# Patient Record
Sex: Female | Born: 1945
Health system: Southern US, Community
[De-identification: ages and names within clinical notes are randomized; demographics above are authoritative.]

## PROBLEM LIST (undated history)

## (undated) DIAGNOSIS — L309 Dermatitis, unspecified: Secondary | ICD-10-CM

## (undated) DIAGNOSIS — I10 Essential (primary) hypertension: Secondary | ICD-10-CM

## (undated) DIAGNOSIS — IMO0001 Reserved for inherently not codable concepts without codable children: Secondary | ICD-10-CM

## (undated) DIAGNOSIS — E669 Obesity, unspecified: Secondary | ICD-10-CM

## (undated) DIAGNOSIS — E7800 Pure hypercholesterolemia, unspecified: Secondary | ICD-10-CM

## (undated) DIAGNOSIS — I2510 Atherosclerotic heart disease of native coronary artery without angina pectoris: Secondary | ICD-10-CM

## (undated) DIAGNOSIS — J45909 Unspecified asthma, uncomplicated: Secondary | ICD-10-CM

## (undated) DIAGNOSIS — Z85038 Personal history of other malignant neoplasm of large intestine: Secondary | ICD-10-CM

## (undated) DIAGNOSIS — J449 Chronic obstructive pulmonary disease, unspecified: Secondary | ICD-10-CM

## (undated) DIAGNOSIS — E1142 Type 2 diabetes mellitus with diabetic polyneuropathy: Secondary | ICD-10-CM

## (undated) DIAGNOSIS — M8580 Other specified disorders of bone density and structure, unspecified site: Secondary | ICD-10-CM

## (undated) DIAGNOSIS — E739 Lactose intolerance, unspecified: Secondary | ICD-10-CM

## (undated) DIAGNOSIS — E042 Nontoxic multinodular goiter: Secondary | ICD-10-CM

## (undated) DIAGNOSIS — Z9289 Personal history of other medical treatment: Secondary | ICD-10-CM

## (undated) DIAGNOSIS — R7302 Impaired glucose tolerance (oral): Secondary | ICD-10-CM

## (undated) DIAGNOSIS — L409 Psoriasis, unspecified: Secondary | ICD-10-CM

## (undated) DIAGNOSIS — M1990 Unspecified osteoarthritis, unspecified site: Secondary | ICD-10-CM

## (undated) DIAGNOSIS — E119 Type 2 diabetes mellitus without complications: Secondary | ICD-10-CM

## (undated) DIAGNOSIS — M129 Arthropathy, unspecified: Secondary | ICD-10-CM

## (undated) DIAGNOSIS — I517 Cardiomegaly: Secondary | ICD-10-CM

## (undated) DIAGNOSIS — C801 Malignant (primary) neoplasm, unspecified: Secondary | ICD-10-CM

## (undated) DIAGNOSIS — K219 Gastro-esophageal reflux disease without esophagitis: Secondary | ICD-10-CM

## (undated) HISTORY — DX: Nontoxic multinodular goiter: E04.2

## (undated) HISTORY — PX: CARDIAC CATHETERIZATION: SHX172

## (undated) HISTORY — PX: TUBAL LIGATION: SHX77

## (undated) HISTORY — DX: Type 2 diabetes mellitus with diabetic polyneuropathy: E11.42

## (undated) HISTORY — DX: Unspecified asthma, uncomplicated: J45.909

## (undated) HISTORY — DX: Dermatitis, unspecified: L30.9

## (undated) HISTORY — DX: Cardiomegaly: I51.7

## (undated) HISTORY — DX: Arthropathy, unspecified: M12.9

## (undated) HISTORY — PX: COLON SURGERY: SHX602

## (undated) HISTORY — DX: Personal history of other malignant neoplasm of large intestine: Z85.038

## (undated) HISTORY — PX: HERNIA REPAIR: SHX51

## (undated) HISTORY — DX: Other specified disorders of bone density and structure, unspecified site: M85.80

## (undated) HISTORY — DX: Lactose intolerance, unspecified: E73.9

## (undated) HISTORY — DX: Chronic obstructive pulmonary disease, unspecified: J44.9

## (undated) HISTORY — DX: Pure hypercholesterolemia, unspecified: E78.00

## (undated) HISTORY — DX: Unspecified osteoarthritis, unspecified site: M19.90

## (undated) HISTORY — DX: Atherosclerotic heart disease of native coronary artery without angina pectoris: I25.10

## (undated) HISTORY — DX: Psoriasis, unspecified: L40.9

## (undated) HISTORY — DX: Type 2 diabetes mellitus without complications: E11.9

## (undated) HISTORY — DX: Obesity, unspecified: E66.9

## (undated) HISTORY — PX: ABDOMINAL HYSTERECTOMY: SHX81

## (undated) HISTORY — DX: Essential (primary) hypertension: I10

## (undated) HISTORY — DX: Gastro-esophageal reflux disease without esophagitis: K21.9

## (undated) HISTORY — DX: Impaired glucose tolerance (oral): R73.02

---

## 1985-12-12 DIAGNOSIS — C801 Malignant (primary) neoplasm, unspecified: Secondary | ICD-10-CM

## 1985-12-12 HISTORY — PX: HEMICOLECTOMY: SHX854

## 1985-12-12 HISTORY — DX: Malignant (primary) neoplasm, unspecified: C80.1

## 1999-01-07 ENCOUNTER — Other Ambulatory Visit: Admission: RE | Admit: 1999-01-07 | Discharge: 1999-01-07 | Payer: Self-pay | Admitting: Nurse Practitioner

## 1999-02-18 ENCOUNTER — Encounter: Payer: Self-pay | Admitting: Internal Medicine

## 1999-02-18 ENCOUNTER — Ambulatory Visit (HOSPITAL_COMMUNITY): Admission: RE | Admit: 1999-02-18 | Discharge: 1999-02-18 | Payer: Self-pay | Admitting: Internal Medicine

## 2000-01-12 ENCOUNTER — Other Ambulatory Visit: Admission: RE | Admit: 2000-01-12 | Discharge: 2000-01-12 | Payer: Self-pay | Admitting: Internal Medicine

## 2000-02-21 ENCOUNTER — Ambulatory Visit (HOSPITAL_COMMUNITY): Admission: RE | Admit: 2000-02-21 | Discharge: 2000-02-21 | Payer: Self-pay | Admitting: Internal Medicine

## 2000-02-21 ENCOUNTER — Encounter: Payer: Self-pay | Admitting: Internal Medicine

## 2000-03-09 ENCOUNTER — Ambulatory Visit (HOSPITAL_COMMUNITY): Admission: RE | Admit: 2000-03-09 | Discharge: 2000-03-09 | Payer: Self-pay | Admitting: Gastroenterology

## 2000-03-09 ENCOUNTER — Encounter: Payer: Self-pay | Admitting: Gastroenterology

## 2001-01-12 ENCOUNTER — Encounter: Payer: Self-pay | Admitting: Internal Medicine

## 2001-01-15 ENCOUNTER — Other Ambulatory Visit: Admission: RE | Admit: 2001-01-15 | Discharge: 2001-01-15 | Payer: Self-pay | Admitting: Internal Medicine

## 2001-02-13 ENCOUNTER — Ambulatory Visit (HOSPITAL_COMMUNITY): Admission: RE | Admit: 2001-02-13 | Discharge: 2001-02-13 | Payer: Self-pay | Admitting: *Deleted

## 2001-02-13 ENCOUNTER — Encounter: Payer: Self-pay | Admitting: *Deleted

## 2001-02-28 ENCOUNTER — Encounter: Payer: Self-pay | Admitting: Internal Medicine

## 2001-02-28 ENCOUNTER — Ambulatory Visit (HOSPITAL_COMMUNITY): Admission: RE | Admit: 2001-02-28 | Discharge: 2001-02-28 | Payer: Self-pay | Admitting: Internal Medicine

## 2001-03-08 ENCOUNTER — Encounter: Payer: Self-pay | Admitting: Internal Medicine

## 2001-03-08 ENCOUNTER — Ambulatory Visit: Admission: RE | Admit: 2001-03-08 | Discharge: 2001-03-08 | Payer: Self-pay | Admitting: Pulmonary Disease

## 2002-04-09 ENCOUNTER — Ambulatory Visit (HOSPITAL_COMMUNITY): Admission: RE | Admit: 2002-04-09 | Discharge: 2002-04-09 | Payer: Self-pay | Admitting: Internal Medicine

## 2002-04-09 ENCOUNTER — Encounter: Payer: Self-pay | Admitting: Internal Medicine

## 2003-07-21 ENCOUNTER — Ambulatory Visit (HOSPITAL_COMMUNITY): Admission: RE | Admit: 2003-07-21 | Discharge: 2003-07-21 | Payer: Self-pay | Admitting: *Deleted

## 2003-07-21 ENCOUNTER — Encounter: Payer: Self-pay | Admitting: *Deleted

## 2004-06-25 ENCOUNTER — Ambulatory Visit (HOSPITAL_COMMUNITY): Admission: RE | Admit: 2004-06-25 | Discharge: 2004-06-25 | Payer: Self-pay | Admitting: Internal Medicine

## 2004-10-28 ENCOUNTER — Ambulatory Visit: Payer: Self-pay | Admitting: Cardiology

## 2004-11-12 ENCOUNTER — Encounter: Payer: Self-pay | Admitting: Internal Medicine

## 2004-11-12 ENCOUNTER — Ambulatory Visit: Payer: Self-pay | Admitting: Pulmonary Disease

## 2004-12-28 ENCOUNTER — Ambulatory Visit: Payer: Self-pay | Admitting: Pulmonary Disease

## 2004-12-28 ENCOUNTER — Encounter: Payer: Self-pay | Admitting: Internal Medicine

## 2005-02-09 ENCOUNTER — Ambulatory Visit: Payer: Self-pay | Admitting: Pulmonary Disease

## 2005-03-30 ENCOUNTER — Ambulatory Visit: Payer: Self-pay | Admitting: Internal Medicine

## 2005-04-08 ENCOUNTER — Ambulatory Visit: Payer: Self-pay | Admitting: Internal Medicine

## 2005-04-15 ENCOUNTER — Ambulatory Visit: Payer: Self-pay | Admitting: Internal Medicine

## 2005-05-06 ENCOUNTER — Ambulatory Visit: Payer: Self-pay | Admitting: Pulmonary Disease

## 2005-07-29 ENCOUNTER — Ambulatory Visit (HOSPITAL_COMMUNITY): Admission: RE | Admit: 2005-07-29 | Discharge: 2005-07-29 | Payer: Self-pay | Admitting: Internal Medicine

## 2005-08-25 ENCOUNTER — Ambulatory Visit: Payer: Self-pay | Admitting: Pulmonary Disease

## 2005-09-02 ENCOUNTER — Ambulatory Visit: Payer: Self-pay | Admitting: Internal Medicine

## 2005-10-07 ENCOUNTER — Ambulatory Visit: Payer: Self-pay | Admitting: Internal Medicine

## 2006-01-10 ENCOUNTER — Ambulatory Visit: Payer: Self-pay | Admitting: Pulmonary Disease

## 2006-04-03 ENCOUNTER — Ambulatory Visit: Payer: Self-pay | Admitting: Internal Medicine

## 2006-04-10 ENCOUNTER — Ambulatory Visit: Payer: Self-pay | Admitting: Internal Medicine

## 2006-04-28 ENCOUNTER — Ambulatory Visit: Payer: Self-pay | Admitting: Pulmonary Disease

## 2006-05-03 ENCOUNTER — Encounter: Payer: Self-pay | Admitting: Internal Medicine

## 2006-05-03 ENCOUNTER — Ambulatory Visit: Payer: Self-pay

## 2006-08-11 ENCOUNTER — Ambulatory Visit: Payer: Self-pay | Admitting: Pulmonary Disease

## 2006-09-15 ENCOUNTER — Ambulatory Visit: Payer: Self-pay | Admitting: Internal Medicine

## 2006-09-21 ENCOUNTER — Ambulatory Visit: Payer: Self-pay | Admitting: Pulmonary Disease

## 2006-11-15 ENCOUNTER — Ambulatory Visit: Payer: Self-pay | Admitting: Pulmonary Disease

## 2007-02-21 ENCOUNTER — Ambulatory Visit: Payer: Self-pay | Admitting: Pulmonary Disease

## 2007-03-07 ENCOUNTER — Ambulatory Visit: Payer: Self-pay | Admitting: Critical Care Medicine

## 2007-04-04 ENCOUNTER — Ambulatory Visit: Payer: Self-pay | Admitting: Internal Medicine

## 2007-04-04 LAB — CONVERTED CEMR LAB
ALT: 21 units/L (ref 0–40)
AST: 20 units/L (ref 0–37)
Alkaline Phosphatase: 68 units/L (ref 39–117)
BUN: 18 mg/dL (ref 6–23)
Basophils Relative: 0 % (ref 0.0–1.0)
Bilirubin Urine: NEGATIVE
CO2: 31 meq/L (ref 19–32)
Chloride: 106 meq/L (ref 96–112)
Creatinine, Ser: 0.7 mg/dL (ref 0.4–1.2)
Eosinophils Absolute: 0.3 10*3/uL (ref 0.0–0.6)
GFR calc Af Amer: 109 mL/min
Glucose, Bld: 166 mg/dL — ABNORMAL HIGH (ref 70–99)
HDL: 41.3 mg/dL (ref >39.0)
LDL Cholesterol: 114 mg/dL — ABNORMAL HIGH (ref 0–99)
Lymphocytes Relative: 22.8 % (ref 12.0–46.0)
MCHC: 33.7 g/dL (ref 30.0–36.0)
MCV: 80.4 fL (ref 78.0–100.0)
Neutrophils Relative %: 67.9 % (ref 43.0–77.0)
RBC: 4.56 M/uL (ref 3.87–5.11)
Sodium: 143 meq/L (ref 135–145)
TSH: 0.73 microintl units/mL (ref 0.35–5.50)
Total Bilirubin: 0.7 mg/dL (ref 0.3–1.2)
Urine Glucose: NEGATIVE mg/dL
VLDL: 32 mg/dL (ref 0–40)
WBC: 7.2 10*3/uL (ref 4.5–10.5)

## 2007-04-12 ENCOUNTER — Ambulatory Visit: Payer: Self-pay | Admitting: Internal Medicine

## 2007-07-20 ENCOUNTER — Ambulatory Visit: Payer: Self-pay | Admitting: Pulmonary Disease

## 2007-09-03 ENCOUNTER — Encounter: Payer: Self-pay | Admitting: Internal Medicine

## 2007-09-03 DIAGNOSIS — I2510 Atherosclerotic heart disease of native coronary artery without angina pectoris: Secondary | ICD-10-CM

## 2007-09-03 DIAGNOSIS — R002 Palpitations: Secondary | ICD-10-CM

## 2007-09-03 DIAGNOSIS — E669 Obesity, unspecified: Secondary | ICD-10-CM

## 2007-09-03 DIAGNOSIS — E739 Lactose intolerance, unspecified: Secondary | ICD-10-CM

## 2007-09-03 DIAGNOSIS — J45909 Unspecified asthma, uncomplicated: Secondary | ICD-10-CM

## 2007-09-03 DIAGNOSIS — K219 Gastro-esophageal reflux disease without esophagitis: Secondary | ICD-10-CM | POA: Insufficient documentation

## 2007-09-03 DIAGNOSIS — J4489 Other specified chronic obstructive pulmonary disease: Secondary | ICD-10-CM

## 2007-09-03 DIAGNOSIS — J449 Chronic obstructive pulmonary disease, unspecified: Secondary | ICD-10-CM

## 2007-09-03 DIAGNOSIS — I10 Essential (primary) hypertension: Secondary | ICD-10-CM

## 2007-09-03 DIAGNOSIS — E7800 Pure hypercholesterolemia, unspecified: Secondary | ICD-10-CM

## 2007-09-03 DIAGNOSIS — J309 Allergic rhinitis, unspecified: Secondary | ICD-10-CM

## 2007-09-03 HISTORY — DX: Unspecified asthma, uncomplicated: J45.909

## 2007-09-03 HISTORY — DX: Essential (primary) hypertension: I10

## 2007-09-03 HISTORY — DX: Lactose intolerance, unspecified: E73.9

## 2007-09-03 HISTORY — DX: Obesity, unspecified: E66.9

## 2007-09-03 HISTORY — DX: Pure hypercholesterolemia, unspecified: E78.00

## 2007-09-03 HISTORY — DX: Atherosclerotic heart disease of native coronary artery without angina pectoris: I25.10

## 2007-09-03 HISTORY — DX: Gastro-esophageal reflux disease without esophagitis: K21.9

## 2007-09-03 HISTORY — DX: Other specified chronic obstructive pulmonary disease: J44.89

## 2007-09-03 HISTORY — DX: Chronic obstructive pulmonary disease, unspecified: J44.9

## 2007-09-14 ENCOUNTER — Ambulatory Visit: Payer: Self-pay | Admitting: Internal Medicine

## 2007-09-14 LAB — CONVERTED CEMR LAB
BUN: 14 mg/dL (ref 6–23)
Cholesterol: 152 mg/dL (ref 0–200)
Creatinine, Ser: 0.7 mg/dL (ref 0.4–1.2)
GFR calc Af Amer: 109 mL/min
Glucose, Bld: 136 mg/dL — ABNORMAL HIGH (ref 70–99)
Total CHOL/HDL Ratio: 4
VLDL: 16 mg/dL (ref 0–40)

## 2007-09-20 ENCOUNTER — Ambulatory Visit: Payer: Self-pay | Admitting: Internal Medicine

## 2007-09-20 ENCOUNTER — Ambulatory Visit (HOSPITAL_COMMUNITY): Admission: RE | Admit: 2007-09-20 | Discharge: 2007-09-20 | Payer: Self-pay | Admitting: Internal Medicine

## 2007-09-25 ENCOUNTER — Telehealth: Payer: Self-pay | Admitting: Internal Medicine

## 2007-10-25 ENCOUNTER — Ambulatory Visit: Payer: Self-pay | Admitting: Internal Medicine

## 2007-10-29 ENCOUNTER — Ambulatory Visit: Payer: Self-pay | Admitting: Pulmonary Disease

## 2007-10-30 ENCOUNTER — Ambulatory Visit (HOSPITAL_COMMUNITY): Admission: RE | Admit: 2007-10-30 | Discharge: 2007-10-30 | Payer: Self-pay | Admitting: Internal Medicine

## 2007-12-11 ENCOUNTER — Ambulatory Visit: Payer: Self-pay | Admitting: Internal Medicine

## 2007-12-20 ENCOUNTER — Ambulatory Visit: Payer: Self-pay | Admitting: Internal Medicine

## 2007-12-20 ENCOUNTER — Encounter: Payer: Self-pay | Admitting: Internal Medicine

## 2007-12-20 LAB — HM COLONOSCOPY: HM Colonoscopy: NORMAL

## 2008-01-30 DIAGNOSIS — M129 Arthropathy, unspecified: Secondary | ICD-10-CM

## 2008-01-30 DIAGNOSIS — E119 Type 2 diabetes mellitus without complications: Secondary | ICD-10-CM

## 2008-01-30 DIAGNOSIS — M1990 Unspecified osteoarthritis, unspecified site: Secondary | ICD-10-CM | POA: Insufficient documentation

## 2008-01-30 DIAGNOSIS — E1140 Type 2 diabetes mellitus with diabetic neuropathy, unspecified: Secondary | ICD-10-CM

## 2008-01-30 HISTORY — DX: Unspecified osteoarthritis, unspecified site: M19.90

## 2008-01-30 HISTORY — DX: Arthropathy, unspecified: M12.9

## 2008-01-30 HISTORY — DX: Type 2 diabetes mellitus without complications: E11.9

## 2008-03-11 ENCOUNTER — Telehealth (INDEPENDENT_AMBULATORY_CARE_PROVIDER_SITE_OTHER): Payer: Self-pay | Admitting: *Deleted

## 2008-03-27 ENCOUNTER — Encounter: Payer: Self-pay | Admitting: Pulmonary Disease

## 2008-03-31 ENCOUNTER — Ambulatory Visit: Payer: Self-pay | Admitting: Internal Medicine

## 2008-03-31 LAB — CONVERTED CEMR LAB
ALT: 17 units/L (ref 0–35)
Albumin: 3.5 g/dL (ref 3.5–5.2)
Bilirubin Urine: NEGATIVE
Bilirubin, Direct: 0.1 mg/dL (ref 0.0–0.3)
Chloride: 104 meq/L (ref 96–112)
HCT: 38.2 % (ref 36.0–46.0)
Hgb A1c MFr Bld: 6.8 % — ABNORMAL HIGH (ref 4.6–6.0)
Ketones, ur: NEGATIVE mg/dL
LDL Cholesterol: 101 mg/dL — ABNORMAL HIGH (ref 0–99)
Leukocytes, UA: NEGATIVE
Lymphocytes Relative: 25.1 % (ref 12.0–46.0)
Monocytes Absolute: 0.5 10*3/uL (ref 0.1–1.0)
Monocytes Relative: 6.1 % (ref 3.0–12.0)
Neutrophils Relative %: 64.8 % (ref 43.0–77.0)
Nitrite: NEGATIVE
Platelets: 201 10*3/uL (ref 150–400)
TSH: 1.39 microintl units/mL (ref 0.35–5.50)
Total CHOL/HDL Ratio: 3.8
Total Protein, Urine: NEGATIVE mg/dL
Total Protein: 6.4 g/dL (ref 6.0–8.3)
Triglycerides: 93 mg/dL (ref 0–149)
Urine Glucose: NEGATIVE mg/dL
Urobilinogen, UA: 0.2 (ref 0.0–1.0)
WBC: 8.5 10*3/uL (ref 4.5–10.5)
pH: 5.5 (ref 5.0–8.0)

## 2008-04-07 ENCOUNTER — Ambulatory Visit: Payer: Self-pay | Admitting: Internal Medicine

## 2008-04-07 DIAGNOSIS — Z85038 Personal history of other malignant neoplasm of large intestine: Secondary | ICD-10-CM

## 2008-04-07 DIAGNOSIS — M949 Disorder of cartilage, unspecified: Secondary | ICD-10-CM

## 2008-04-07 DIAGNOSIS — M899 Disorder of bone, unspecified: Secondary | ICD-10-CM | POA: Insufficient documentation

## 2008-04-07 HISTORY — DX: Personal history of other malignant neoplasm of large intestine: Z85.038

## 2008-04-15 ENCOUNTER — Telehealth (INDEPENDENT_AMBULATORY_CARE_PROVIDER_SITE_OTHER): Payer: Self-pay | Admitting: *Deleted

## 2008-05-20 ENCOUNTER — Telehealth (INDEPENDENT_AMBULATORY_CARE_PROVIDER_SITE_OTHER): Payer: Self-pay | Admitting: *Deleted

## 2008-05-22 ENCOUNTER — Encounter: Payer: Self-pay | Admitting: Internal Medicine

## 2008-06-04 ENCOUNTER — Ambulatory Visit: Payer: Self-pay | Admitting: Pulmonary Disease

## 2008-06-23 ENCOUNTER — Encounter: Payer: Self-pay | Admitting: Internal Medicine

## 2008-09-05 ENCOUNTER — Ambulatory Visit: Payer: Self-pay | Admitting: Pulmonary Disease

## 2008-09-23 ENCOUNTER — Telehealth: Payer: Self-pay | Admitting: Internal Medicine

## 2008-09-25 ENCOUNTER — Ambulatory Visit (HOSPITAL_COMMUNITY): Admission: RE | Admit: 2008-09-25 | Discharge: 2008-09-25 | Payer: Self-pay | Admitting: Internal Medicine

## 2008-10-01 ENCOUNTER — Ambulatory Visit: Payer: Self-pay | Admitting: Internal Medicine

## 2008-10-01 LAB — CONVERTED CEMR LAB
BUN: 15 mg/dL (ref 6–23)
CO2: 31 meq/L (ref 19–32)
Calcium: 8.8 mg/dL (ref 8.4–10.5)
Cholesterol: 156 mg/dL (ref 0–200)
GFR calc non Af Amer: 77 mL/min
HDL: 40.5 mg/dL (ref >39.0)
Sodium: 143 meq/L (ref 135–145)
Total CHOL/HDL Ratio: 3.9
Triglycerides: 88 mg/dL (ref 0–149)
VLDL: 18 mg/dL (ref 0–40)

## 2008-10-07 ENCOUNTER — Ambulatory Visit: Payer: Self-pay | Admitting: Internal Medicine

## 2008-10-07 DIAGNOSIS — J45901 Unspecified asthma with (acute) exacerbation: Secondary | ICD-10-CM | POA: Insufficient documentation

## 2008-11-12 ENCOUNTER — Ambulatory Visit: Payer: Self-pay | Admitting: Pulmonary Disease

## 2009-03-12 ENCOUNTER — Ambulatory Visit: Payer: Self-pay | Admitting: Pulmonary Disease

## 2009-03-19 ENCOUNTER — Ambulatory Visit: Payer: Self-pay | Admitting: Pulmonary Disease

## 2009-03-19 ENCOUNTER — Encounter: Payer: Self-pay | Admitting: Adult Health

## 2009-03-19 ENCOUNTER — Encounter (INDEPENDENT_AMBULATORY_CARE_PROVIDER_SITE_OTHER): Payer: Self-pay | Admitting: *Deleted

## 2009-04-03 ENCOUNTER — Ambulatory Visit: Payer: Self-pay | Admitting: Internal Medicine

## 2009-04-03 LAB — CONVERTED CEMR LAB
Alkaline Phosphatase: 60 units/L (ref 39–117)
BUN: 13 mg/dL (ref 6–23)
Basophils Absolute: 0 10*3/uL (ref 0.0–0.1)
Bilirubin Urine: NEGATIVE
Bilirubin, Direct: 0.1 mg/dL (ref 0.0–0.3)
Chloride: 110 meq/L (ref 96–112)
Creatinine, Ser: 0.7 mg/dL (ref 0.4–1.2)
Creatinine,U: 197.9 mg/dL
Eosinophils Absolute: 0.3 10*3/uL (ref 0.0–0.7)
Eosinophils Relative: 2.8 % (ref 0.0–5.0)
GFR calc non Af Amer: 108.49 mL/min (ref >60)
Glucose, Bld: 121 mg/dL — ABNORMAL HIGH (ref 70–99)
HCT: 40.1 % (ref 36.0–46.0)
Ketones, ur: NEGATIVE mg/dL
Lymphs Abs: 2.4 10*3/uL (ref 0.7–4.0)
MCHC: 33 g/dL (ref 30.0–36.0)
MCV: 82.5 fL (ref 78.0–100.0)
Monocytes Absolute: 0.7 10*3/uL (ref 0.1–1.0)
Nitrite: NEGATIVE
Platelets: 199 10*3/uL (ref 150.0–400.0)
Potassium: 4.4 meq/L (ref 3.5–5.1)
RDW: 16 % — ABNORMAL HIGH (ref 11.5–14.6)
TSH: 1.17 microintl units/mL (ref 0.35–5.50)
Total CHOL/HDL Ratio: 4
Urine Glucose: NEGATIVE mg/dL
Urobilinogen, UA: 0.2 (ref 0.0–1.0)
pH: 6 (ref 5.0–8.0)

## 2009-04-09 ENCOUNTER — Ambulatory Visit: Payer: Self-pay | Admitting: Internal Medicine

## 2009-07-21 ENCOUNTER — Ambulatory Visit: Payer: Self-pay | Admitting: Pulmonary Disease

## 2009-09-29 ENCOUNTER — Ambulatory Visit: Payer: Self-pay | Admitting: Internal Medicine

## 2009-09-29 ENCOUNTER — Ambulatory Visit (HOSPITAL_COMMUNITY): Admission: RE | Admit: 2009-09-29 | Discharge: 2009-09-29 | Payer: Self-pay | Admitting: Internal Medicine

## 2009-09-29 LAB — CONVERTED CEMR LAB
BUN: 13 mg/dL (ref 6–23)
CO2: 31 meq/L (ref 19–32)
Cholesterol: 139 mg/dL (ref 0–200)
GFR calc non Af Amer: 108.32 mL/min (ref >60)
Glucose, Bld: 105 mg/dL — ABNORMAL HIGH (ref 70–99)
Hgb A1c MFr Bld: 6.6 % — ABNORMAL HIGH (ref 4.6–6.5)
LDL Cholesterol: 81 mg/dL (ref 0–99)
Potassium: 4.1 meq/L (ref 3.5–5.1)
Sodium: 142 meq/L (ref 135–145)
VLDL: 16.6 mg/dL (ref 0.0–40.0)

## 2009-10-07 ENCOUNTER — Ambulatory Visit: Payer: Self-pay | Admitting: Internal Medicine

## 2009-10-19 ENCOUNTER — Ambulatory Visit: Payer: Self-pay | Admitting: Pulmonary Disease

## 2009-11-10 ENCOUNTER — Ambulatory Visit: Payer: Self-pay | Admitting: Internal Medicine

## 2009-11-10 DIAGNOSIS — R350 Frequency of micturition: Secondary | ICD-10-CM

## 2009-11-10 LAB — CONVERTED CEMR LAB
Bilirubin Urine: NEGATIVE
Specific Gravity, Urine: 1.015 (ref 1.000–1.030)
Urobilinogen, UA: 0.2 (ref 0.0–1.0)
pH: 6 (ref 5.0–8.0)

## 2010-02-16 ENCOUNTER — Ambulatory Visit: Payer: Self-pay | Admitting: Pulmonary Disease

## 2010-04-06 ENCOUNTER — Ambulatory Visit: Payer: Self-pay | Admitting: Internal Medicine

## 2010-04-06 LAB — CONVERTED CEMR LAB
AST: 17 units/L (ref 0–37)
Albumin: 3.8 g/dL (ref 3.5–5.2)
BUN: 15 mg/dL (ref 6–23)
Bilirubin Urine: NEGATIVE
Bilirubin, Direct: 0.2 mg/dL (ref 0.0–0.3)
Calcium: 9.3 mg/dL (ref 8.4–10.5)
Cholesterol: 144 mg/dL (ref 0–200)
Creatinine, Ser: 0.7 mg/dL (ref 0.4–1.2)
GFR calc non Af Amer: 108.14 mL/min (ref >60)
Glucose, Bld: 100 mg/dL — ABNORMAL HIGH (ref 70–99)
HCT: 38.3 % (ref 36.0–46.0)
Hemoglobin: 12.8 g/dL (ref 12.0–15.0)
Hgb A1c MFr Bld: 6.3 % (ref 4.6–6.5)
Ketones, ur: NEGATIVE mg/dL
Leukocytes, UA: NEGATIVE
Lymphocytes Relative: 27 % (ref 12.0–46.0)
Lymphs Abs: 2.2 10*3/uL (ref 0.7–4.0)
MCHC: 33.5 g/dL (ref 30.0–36.0)
Microalb Creat Ratio: 7.5 mg/g (ref 0.0–30.0)
Monocytes Absolute: 0.6 10*3/uL (ref 0.1–1.0)
Monocytes Relative: 6.8 % (ref 3.0–12.0)
Neutro Abs: 5.2 10*3/uL (ref 1.4–7.7)
Neutrophils Relative %: 62.7 % (ref 43.0–77.0)
RBC: 4.61 M/uL (ref 3.87–5.11)
Sodium: 144 meq/L (ref 135–145)
Specific Gravity, Urine: >=1.03 (ref 1.000–1.030)
Total Protein: 6.2 g/dL (ref 6.0–8.3)
Triglycerides: 101 mg/dL (ref 0.0–149.0)
Urine Glucose: NEGATIVE mg/dL
Urobilinogen, UA: 0.2 (ref 0.0–1.0)
WBC: 8.3 10*3/uL (ref 4.5–10.5)

## 2010-04-12 ENCOUNTER — Ambulatory Visit: Payer: Self-pay | Admitting: Internal Medicine

## 2010-05-07 ENCOUNTER — Ambulatory Visit: Payer: Self-pay | Admitting: Internal Medicine

## 2010-05-14 ENCOUNTER — Encounter: Payer: Self-pay | Admitting: Internal Medicine

## 2010-05-14 ENCOUNTER — Encounter: Admission: RE | Admit: 2010-05-14 | Discharge: 2010-05-14 | Payer: Self-pay | Admitting: Internal Medicine

## 2010-05-17 ENCOUNTER — Telehealth (INDEPENDENT_AMBULATORY_CARE_PROVIDER_SITE_OTHER): Payer: Self-pay | Admitting: *Deleted

## 2010-05-21 ENCOUNTER — Ambulatory Visit: Payer: Self-pay | Admitting: Endocrinology

## 2010-05-21 DIAGNOSIS — E042 Nontoxic multinodular goiter: Secondary | ICD-10-CM

## 2010-05-21 HISTORY — DX: Nontoxic multinodular goiter: E04.2

## 2010-05-26 ENCOUNTER — Telehealth: Payer: Self-pay | Admitting: Internal Medicine

## 2010-06-22 ENCOUNTER — Telehealth: Payer: Self-pay | Admitting: Internal Medicine

## 2010-06-25 ENCOUNTER — Ambulatory Visit: Payer: Self-pay | Admitting: Pulmonary Disease

## 2010-07-02 ENCOUNTER — Encounter: Payer: Self-pay | Admitting: Internal Medicine

## 2010-07-02 ENCOUNTER — Ambulatory Visit: Payer: Self-pay | Admitting: Internal Medicine

## 2010-07-06 ENCOUNTER — Encounter: Payer: Self-pay | Admitting: Pulmonary Disease

## 2010-08-13 ENCOUNTER — Telehealth: Payer: Self-pay | Admitting: Internal Medicine

## 2010-10-19 ENCOUNTER — Ambulatory Visit: Payer: Self-pay | Admitting: Internal Medicine

## 2010-10-19 DIAGNOSIS — R062 Wheezing: Secondary | ICD-10-CM

## 2010-10-19 LAB — CONVERTED CEMR LAB
BUN: 14 mg/dL (ref 6–23)
Cholesterol: 158 mg/dL (ref 0–200)
GFR calc non Af Amer: 121.92 mL/min (ref >60)
Glucose, Bld: 97 mg/dL (ref 70–99)
HDL: 45.6 mg/dL (ref >39.00)
Hgb A1c MFr Bld: 6.5 % (ref 4.6–6.5)
LDL Cholesterol: 90 mg/dL (ref 0–99)
Potassium: 3.9 meq/L (ref 3.5–5.1)
VLDL: 22 mg/dL (ref 0.0–40.0)

## 2010-10-21 ENCOUNTER — Encounter: Payer: Self-pay | Admitting: Internal Medicine

## 2010-10-26 ENCOUNTER — Ambulatory Visit (HOSPITAL_COMMUNITY): Admission: RE | Admit: 2010-10-26 | Discharge: 2010-10-26 | Payer: Self-pay | Admitting: Internal Medicine

## 2010-11-24 ENCOUNTER — Telehealth: Payer: Self-pay | Admitting: Internal Medicine

## 2010-12-15 ENCOUNTER — Telehealth (INDEPENDENT_AMBULATORY_CARE_PROVIDER_SITE_OTHER): Payer: Self-pay | Admitting: *Deleted

## 2010-12-21 ENCOUNTER — Ambulatory Visit
Admission: RE | Admit: 2010-12-21 | Discharge: 2010-12-21 | Payer: Self-pay | Source: Home / Self Care | Attending: Pulmonary Disease | Admitting: Pulmonary Disease

## 2011-01-13 NOTE — Progress Notes (Signed)
Summary: ALT med  Phone Note Call from Patient Call back at Home Phone [phone]   Summary of Call: Pt called stating that her Rx Drug Plan states that starting in 2012 the cost of her Metoprolol Succinate will go up from $5 to $45. Drug Plan suggest; Metoprolol Tartarte, Carvedilol or Atenolol which will cost $3. Pt is requesting alternative to [facility]. Initial call taken by: Margaret Pyle, CMA,  November 24, 2010 10:30 AM  Follow-up for Phone Call        ok to change the toprol to metoprolol 25 mg - 1and 1/2 by mouth two times a day   to robin to handle Follow-up by: Corwin Levins MD,  November 24, 2010 1:03 PM    New/Updated Medications: METOPROLOL TARTRATE 25 MG TABS (METOPROLOL TARTRATE) 1 and 1/2 by mouth two times a day Prescriptions: METOPROLOL TARTRATE 25 MG TABS (METOPROLOL TARTRATE) 1 and 1/2 by mouth two times a day  #90 x 11   Entered by:   Zella Ball Ewing CMA (AAMA)   Authorized by:   Corwin Levins MD   Signed by:   Scharlene Gloss CMA (AAMA) on 11/24/2010   Method used:   [facility]ed to ...       [facility]  [address] [phone]* (retail)       [address]       [place], [place]  [address]       Ph: [phone] or [phone]       Fax: [phone]   RxID:   [id number]

## 2011-01-13 NOTE — Assessment & Plan Note (Signed)
Summary: CPX/ [employer]/ NWS   Vital Signs:  Patient profile:   65 year old female Height:      63 inches Weight:      161.50 pounds BMI:     28.71 O2 Sat:      97 % on Room air Temp:     98.2 degrees F oral Pulse rate:   60 / minute BP sitting:   104 / 64  (left arm) Cuff size:   regular  Vitals Entered ByZella Ball Ewing (Apr 12, 2010 11:09 AM)  O2 Flow:  Room air  Preventive Care Screening  Mammogram:    Next Due:  10/2010  CC: Adult Physical/RE   Primary Care Provider:  Oliver Barre  CC:  Adult Physical/RE.  History of Present Illness: overall doing well,  Pt denies CP, sob, doe, wheezing, orthopnea, pnd, worsening LE edema, palps, dizziness or syncope  Pt denies new neuro symptoms such as headache, facial or extremity weakness   Pt denies polydipsia, polyuria, or low sugar symptoms such as shakiness improved with eating.  Overall good compliance with meds, trying to follow low chol, DM diet, wt stable, little excercise however   Does mention she thiniks a nodule to the right thyroid has slightly enlarged but denies hyper/hypothyroid symptoms.  No increased pain, wt loss, night sweats.    Problems Prior to Update: 1)  Goiter, Unspecified  (ICD-240.9) 2)  Urinary Frequency  (ICD-788.41) 3)  Asthma, With Acute Exacerbation  (ICD-493.92) 4)  Hx of Colon Cancer  (ICD-10.05) 5)  Preventive Health Care  (ICD-V70.0) 6)  Osteopenia  (ICD-733.90) 7)  Diabetes Mellitus  (ICD-250.00) 8)  Degenerative Joint Disease  (ICD-715.90) 9)  Hx of Adenocarcinoma, Colon, Hx of  (ICD-V10.05) 10)  Arthritis  (ICD-716.90) 11)  Obesity  (ICD-278.00) 12)  Palpitations  (ICD-785.1) 13)  Glucose Intolerance  (ICD-271.3) 14)  Hypercholesterolemia  (ICD-272.0) 15)  Hypertension  (ICD-401.9) 16)  Coronary Artery Disease  (ICD-414.00) 17)  Gerd  (ICD-530.81) 18)  COPD  (ICD-496) 19)  Asthma  (ICD-493.90) 20)  Allergic Rhinitis  (ICD-477.9)  Medications Prior to Update: 1)  Omeprazole 20 Mg  Tbec  (Omeprazole) .... Take 1 Tablet By Mouth Once A Day 2)  Lovastatin 40 Mg  Tabs (Lovastatin) .... 2 By Mouth Qd 3)  Fexofenadine Hcl 180 Mg Tabs (Fexofenadine Hcl) .Marland Kitchen.. 1 Once Daily 4)  Hydrochlorothiazide 25 Mg Tabs (Hydrochlorothiazide) .Marland Kitchen.. 1po Qd 5)  Naproxen 500 Mg Tabs (Naproxen) .... Use As Needed  Two Times A Day As Needed 6)  Ecotrin 325 Mg  Tbec (Aspirin) .Marland Kitchen.. 1 By Mouth Qd 7)  Evista 60 Mg  Tabs (Raloxifene Hcl) .Marland Kitchen.. 1 By Mouth Qd 8)  Norvasc 5 Mg  Tabs (Amlodipine Besylate) .Marland Kitchen.. 1 By Mouth Once Daily 9)  Metformin Hcl 500 Mg  Tabs (Metformin Hcl) .... 2 By Mouth Once Daily 10)  Toprol Xl 50 Mg  Tb24 (Metoprolol Succinate) .Marland Kitchen.. 1 1/2 Tab Once Daily 11)  Mucinex Maximum Strength 1200 Mg  Tb12 (Guaifenesin) .... 2 Tabs By Mouth Prn 12)  Freestyle Lite Test  Strp (Glucose Blood) .... Administer 1 Strip Once Daily As Directed 13)  Freestyle Lancets  Misc (Lancets) .... Use As Directed 14)  Fluticasone Propionate 50 Mcg/act Susp (Fluticasone Propionate) .... 2 Puffs Two Times A Day 15)  Triamcinolone Acetonide 0.1 % Crea (Triamcinolone Acetonide) .... Use Asd Two Times A Day As Needed 16)  Clobetasol Propionate 0.05 % Oint (Clobetasol Propionate) .... Use Twice Once Daily  To Hand and Feet As Needed 17)  Desonide 0.05 % Lotn (Desonide) .... Use Under Breast Two Times A Day As Needed 18)  Ketoconazole 2 % Crea (Ketoconazole) .... Use Asd Two Times A Day As Needed Under Breast 19)  Fluocinolone Acetonide 0.025 % Oint (Fluocinolone Acetonide) .... Use Asd Two Times A Day As Needed  Current Medications (verified): 1)  Omeprazole 20 Mg  Tbec (Omeprazole) .... Take 1 Tablet By Mouth Once A Day 2)  Lovastatin 40 Mg  Tabs (Lovastatin) .... 2 By Mouth Qd 3)  Fexofenadine Hcl 180 Mg Tabs (Fexofenadine Hcl) .Marland Kitchen.. 1 Once Daily 4)  Hydrochlorothiazide 25 Mg Tabs (Hydrochlorothiazide) .Marland Kitchen.. 1po Qd 5)  Naproxen 500 Mg Tabs (Naproxen) .... Use As Needed  Two Times A Day As Needed 6)  Ecotrin 325 Mg  Tbec  (Aspirin) .Marland Kitchen.. 1 By Mouth Qd 7)  Evista 60 Mg  Tabs (Raloxifene Hcl) .Marland Kitchen.. 1 By Mouth Qd 8)  Norvasc 5 Mg  Tabs (Amlodipine Besylate) .Marland Kitchen.. 1 By Mouth Once Daily 9)  Metformin Hcl 500 Mg  Tabs (Metformin Hcl) .... 2 By Mouth Once Daily 10)  Toprol Xl 50 Mg  Tb24 (Metoprolol Succinate) .Marland Kitchen.. 1 1/2 Tab Once Daily 11)  Mucinex Maximum Strength 1200 Mg  Tb12 (Guaifenesin) .... 2 Tabs By Mouth Prn 12)  Freestyle Lite Test  Strp (Glucose Blood) .... Administer 1 Strip Once Daily As Directed 13)  Freestyle Lancets  Misc (Lancets) .... Use As Directed 14)  Fluticasone Propionate 50 Mcg/act Susp (Fluticasone Propionate) .... 2 Puffs Two Times A Day 15)  Triamcinolone Acetonide 0.1 % Crea (Triamcinolone Acetonide) .... Use Asd Two Times A Day As Needed 16)  Clobetasol Propionate 0.05 % Oint (Clobetasol Propionate) .... Use Twice Once Daily To Hand and Feet As Needed 17)  Desonide 0.05 % Lotn (Desonide) .... Use Under Breast Two Times A Day As Needed 18)  Ketoconazole 2 % Crea (Ketoconazole) .... Use Asd Two Times A Day As Needed Under Breast 19)  Fluocinolone Acetonide 0.025 % Oint (Fluocinolone Acetonide) .... Use Asd Two Times A Day As Needed  Allergies (verified): 1)  ! Zocor  Past History:  Past Surgical History: Last updated: 01/30/2008 Hysterectomy Percutaneous transluminal coronary angioplasty bone density 03/30/05 hernia repair tubal ligation rt hemicolectomy  Social History: Last updated: 04-18-2008 Married 4 children - 1 died with meningitis work - stay at home Former Smoker Alcohol use-no  Risk Factors: Smoking Status: quit (04-18-2008)  Past Medical History: DIABETES MELLITUS (ICD-250.00) DEGENERATIVE JOINT DISEASE (ICD-715.90) Hx of ADENOCARCINOMA, COLON, HX OF (ICD-V10.05) ARTHRITIS (ICD-716.90) OBESITY (ICD-278.00) PALPITATIONS (ICD-785.1) GLUCOSE INTOLERANCE (ICD-271.3) HYPERCHOLESTEROLEMIA (ICD-272.0) HYPERTENSION (ICD-401.9) CORONARY ARTERY DISEASE  (ICD-414.00) GERD (ICD-530.81) COPD (ICD-496) chronic asthmatic bronchitis ASTHMA (ICD-493.90) ALLERGIC RHINITIS (ICD-477.9) Osteopenia hx of intestinal adhesions LVH psoriasis/eczema goiter  Review of Systems       all otherwise negative per pt -    Physical Exam  General:  alert and well-developed.   Head:  normocephalic and atraumatic.   Eyes:  vision grossly intact, pupils equal, and pupils round.   Ears:  R ear normal and L ear normal.   Nose:  no external deformity and no nasal discharge.   Mouth:  no gingival abnormalities and pharynx pink and moist.   Neck:  supple.  , throid with enlarged right thyroid/prob nodular, nontender Lungs:  normal respiratory effort and normal breath sounds.   Heart:  normal rate and regular rhythm.   Abdomen:  soft, non-tender, and normal bowel sounds.  Msk:  no joint tenderness and no joint swelling.   Extremities:  no edema, no erythema    Impression & Recommendations:  Problem # 1:  DIABETES MELLITUS (ICD-250.00)  Her updated medication list for this problem includes:    Ecotrin 325 Mg Tbec (Aspirin) .Marland Kitchen... 1 by mouth qd    Metformin Hcl 500 Mg Tabs (Metformin hcl) .Marland Kitchen... 2 by mouth once daily  Labs Reviewed: Creat: 0.7 (04/06/2010)    Reviewed HgBA1c results: 6.3 (04/06/2010)  6.6 (09/29/2009) stable overall by hx and exam, ok to continue meds/tx as is   Problem # 2:  HYPERCHOLESTEROLEMIA (ICD-272.0)  Her updated medication list for this problem includes:    Lovastatin 40 Mg Tabs (Lovastatin) .Marland Kitchen... 2 by mouth qd  Labs Reviewed: SGOT: 17 (04/06/2010)   SGPT: 16 (04/06/2010)   HDL:41.90 (04/06/2010), 41.70 (09/29/2009)  LDL:82 (04/06/2010), 81 (09/29/2009)  Chol:144 (04/06/2010), 139 (09/29/2009)  Trig:101.0 (04/06/2010), 83.0 (09/29/2009) stable overall by hx and exam, ok to continue meds/tx as is   Problem # 3:  HYPERTENSION (ICD-401.9)  Her updated medication list for this problem includes:    Hydrochlorothiazide 25  Mg Tabs (Hydrochlorothiazide) .Marland Kitchen... 1po qd    Norvasc 5 Mg Tabs (Amlodipine besylate) .Marland Kitchen... 1 by mouth once daily    Toprol Xl 50 Mg Tb24 (Metoprolol succinate) .Marland Kitchen... 1 1/2 tab once daily  Orders: EKG w/ Interpretation (93000)  BP today: 104/64 Prior BP: 112/70 (02/16/2010)  Labs Reviewed: K+: 4.4 (04/06/2010) Creat: : 0.7 (04/06/2010)   Chol: 144 (04/06/2010)   HDL: 41.90 (04/06/2010)   LDL: 82 (04/06/2010)   TG: 101.0 (04/06/2010) stable overall by hx and exam, ok to continue meds/tx as is   Problem # 4:  ASTHMA (ICD-493.90) stable overall by hx and exam, ok to continue meds/tx as is, does no require further meds at this time  Problem # 5:  GOITER, UNSPECIFIED (ICD-240.9) with right nodule  ? enlarging - she is adamant she does not want u/s at this time or other eval  Complete Medication List: 1)  Omeprazole 20 Mg Tbec (Omeprazole) .... Take 1 tablet by mouth once a day 2)  Lovastatin 40 Mg Tabs (Lovastatin) .... 2 by mouth qd 3)  Fexofenadine Hcl 180 Mg Tabs (Fexofenadine hcl) .Marland Kitchen.. 1 once daily 4)  Hydrochlorothiazide 25 Mg Tabs (Hydrochlorothiazide) .Marland Kitchen.. 1po qd 5)  Naproxen 500 Mg Tabs (Naproxen) .... Use as needed  two times a day as needed 6)  Ecotrin 325 Mg Tbec (Aspirin) .Marland Kitchen.. 1 by mouth qd 7)  Evista 60 Mg Tabs (Raloxifene hcl) .Marland Kitchen.. 1 by mouth qd 8)  Norvasc 5 Mg Tabs (Amlodipine besylate) .Marland Kitchen.. 1 by mouth once daily 9)  Metformin Hcl 500 Mg Tabs (Metformin hcl) .... 2 by mouth once daily 10)  Toprol Xl 50 Mg Tb24 (Metoprolol succinate) .Marland Kitchen.. 1 1/2 tab once daily 11)  Mucinex Maximum Strength 1200 Mg Tb12 (Guaifenesin) .... 2 tabs by mouth prn 12)  Freestyle Lite Test Strp (Glucose blood) .... Administer 1 strip once daily as directed 13)  Freestyle Lancets Misc (Lancets) .... Use as directed 14)  Fluticasone Propionate 50 Mcg/act Susp (Fluticasone propionate) .... 2 puffs two times a day 15)  Triamcinolone Acetonide 0.1 % Crea (Triamcinolone acetonide) .... Use asd two  times a day as needed 16)  Clobetasol Propionate 0.05 % Oint (Clobetasol propionate) .... Use twice once daily to hand and feet as needed 17)  Desonide 0.05 % Lotn (Desonide) .... Use under breast two times a day as needed  18)  Ketoconazole 2 % Crea (Ketoconazole) .... Use asd two times a day as needed under breast 19)  Fluocinolone Acetonide 0.025 % Oint (Fluocinolone acetonide) .... Use asd two times a day as needed  Patient Instructions: 1)  Continue all previous medications as before this visit 2)  Please schedule a follow-up appointment in 6 months for your "initial medicare wellness exam" 3)  Please call if you change your mind about further evaluation of the thyroid

## 2011-01-13 NOTE — Progress Notes (Signed)
Summary: Rx req  Phone Note Call from Patient Call back at Home Phone [phone]   Caller: Patient Call For: Corwin Levins MD Summary of Call: Pt left message on triage B. Pt requesting generic's for Norvas and Toprol. Please let pt know. Initial call taken by: Verdell Face,  August 13, 2010 10:56 AM  Follow-up for Phone Call        these meds already are generic - ok to ask for this at the pharmacy Follow-up by: Corwin Levins MD,  August 17, 2010 12:52 PM    Prescriptions: TOPROL XL 50 MG  TB24 (METOPROLOL SUCCINATE) 1 1/2 tab once daily  #45 Tablet x 5   Entered by:   Margaret Pyle, CMA   Authorized by:   Corwin Levins MD   Signed by:   Margaret Pyle, CMA on 08/17/2010   Method used:   [facility]        [facility]  [address] [address] [phone]* [facility]ail)       [address]       [place], [place]  [address]       Ph: [phone] or [phone]       Fax: [phone]   RxID:   [id number] NORVASC 5 MG  TABS (AMLODIPINE BESYLATE) 1 by mouth once daily  #90 x 1   Entered by:   Margaret Pyle, CMA   Authorized by:   Corwin Levins MD   Signed by:   Margaret Pyle, CMA on 08/17/2010   Method used:   [facility]        [facility]  [address] [phone]* (retail)       [address]       [place], [place]  [address]       Ph: [phone] or [phone]       Fax: [phone]   RxID:   [id number]

## 2011-01-13 NOTE — Progress Notes (Signed)
  Phone Note Other Incoming   Request: Send information Summary of Call: Request for records received from [facility] [employer]. Request forwarded to [employer].

## 2011-01-13 NOTE — Assessment & Plan Note (Signed)
Summary: 6 mos f/u / #/ cd   Vital Signs:  Patient profile:   65 year old female Height:      63 inches Weight:      156 pounds BMI:     27.73 O2 Sat:      97 % on Room air Temp:     98 degrees F oral Pulse rate:   66 / minute BP sitting:   122 / 70  (left arm) Cuff size:   regular  Vitals Entered By: Zella Ball Ewing CMA Duncan Dull) (October 19, 2010 9:46 AM)  O2 Flow:  Room air CC: 6 month followup/RE   Primary Care Provider:  Oliver Barre  CC:  6 month followup/RE.  History of Present Illness: here to f/u - med review indicates pt not taking the evista as too expensive;  dxa reviewed with pt c/w mild osteopenia today; c/o today acute onset fever, ST, mild prod cough for 3 days with greenish sputum, headache, general weakness and malaise, and today with mild sob/doe/wheezing.  Pt denies CP,  orthopnea, pnd, worsening LE edema, palps, dizziness or syncope . Pt denies new neuro symptoms such as headache, facial or extremity weakness  Pt denies polydipsia, polyuria, or low sugar symptoms such as shakiness improved with eating.  Overall good compliance with meds, trying to follow low chol, DM diet, wt stable, little excercise however  No recent unusual wt loss, night sweats, loss of appetite or other constitutional symptoms except with the acute illness as above.    Preventive Screening-Counseling & Management      Drug Use:  no.    Problems Prior to Update: 1)  Wheezing  (ICD-786.07) 2)  Bronchitis-acute  (ICD-466.0) 3)  Goiter, Multinodular  (ICD-241.1) 4)  Urinary Frequency  (ICD-788.41) 5)  Asthma, With Acute Exacerbation  (ICD-493.92) 6)  Hx of Colon Cancer  (ICD-10.05) 7)  Preventive Health Care  (ICD-V70.0) 8)  Osteopenia  (ICD-733.90) 9)  Diabetes Mellitus  (ICD-250.00) 10)  Degenerative Joint Disease  (ICD-715.90) 11)  Hx of Adenocarcinoma, Colon, Hx of  (ICD-V10.05) 12)  Arthritis  (ICD-716.90) 13)  Obesity  (ICD-278.00) 14)  Palpitations  (ICD-785.1) 15)  Glucose  Intolerance  (ICD-271.3) 16)  Hypercholesterolemia  (ICD-272.0) 17)  Hypertension  (ICD-401.9) 18)  Coronary Artery Disease  (ICD-414.00) 19)  Gerd  (ICD-530.81) 20)  COPD  (ICD-496) 21)  Asthma  (ICD-493.90) 22)  Allergic Rhinitis  (ICD-477.9)  Medications Prior to Update: 1)  Omeprazole 20 Mg  Tbec (Omeprazole) .... Take 1 Tablet By Mouth Once A Day 2)  Lovastatin 40 Mg  Tabs (Lovastatin) .... 2 By Mouth Qd 3)  Hydrochlorothiazide 25 Mg Tabs (Hydrochlorothiazide) .Marland Kitchen.. 1po Qd 4)  Naproxen 500 Mg Tabs (Naproxen) .... Use As Needed  Two Times A Day As Needed 5)  Ecotrin 325 Mg  Tbec (Aspirin) .Marland Kitchen.. 1 By Mouth Qd 6)  Evista 60 Mg  Tabs (Raloxifene Hcl) .Marland Kitchen.. 1 By Mouth Qd 7)  Norvasc 5 Mg  Tabs (Amlodipine Besylate) .Marland Kitchen.. 1 By Mouth Once Daily 8)  Metformin Hcl 500 Mg  Tabs (Metformin Hcl) .... 2 By Mouth Once Daily 9)  Toprol Xl 50 Mg  Tb24 (Metoprolol Succinate) .Marland Kitchen.. 1 1/2 Tab Once Daily 10)  Mucinex Maximum Strength 1200 Mg  Tb12 (Guaifenesin) .... 2 Tabs By Mouth Prn 11)  Freestyle Lite Test  Strp (Glucose Blood) .... Administer 1 Strip Once Daily As Directed 12)  Freestyle Lancets  Misc (Lancets) .... Use As Directed 13)  Fluticasone Propionate  50 Mcg/act Susp (Fluticasone Propionate) .... 2 Puffs Two Times A Day As Needed 14)  Triamcinolone Acetonide 0.1 % Crea (Triamcinolone Acetonide) .... Use Asd Two Times A Day As Needed 15)  Clobetasol Propionate 0.05 % Oint (Clobetasol Propionate) .... Use Twice Once Daily To Hand and Feet As Needed 16)  Desonide 0.05 % Lotn (Desonide) .... Use Under Breast Two Times A Day As Needed 17)  Ketoconazole 2 % Crea (Ketoconazole) .... Use Asd Two Times A Day As Needed Under Breast 18)  Fluocinolone Acetonide 0.025 % Oint (Fluocinolone Acetonide) .... Use Asd Two Times A Day As Needed 19)  Advair Diskus 250-50 Mcg/dose Aepb (Fluticasone-Salmeterol) .Marland Kitchen.. 1 Puff Two Times A Day 20)  Allegra Otc .... Prn  Current Medications (verified): 1)  Omeprazole 20  Mg  Tbec (Omeprazole) .... Take 1 Tablet By Mouth Once A Day 2)  Lovastatin 40 Mg  Tabs (Lovastatin) .... 2 By Mouth Qd 3)  Hydrochlorothiazide 25 Mg Tabs (Hydrochlorothiazide) .Marland Kitchen.. 1po Qd 4)  Naproxen 500 Mg Tabs (Naproxen) .... Use As Needed  Two Times A Day As Needed 5)  Ecotrin 325 Mg  Tbec (Aspirin) .Marland Kitchen.. 1 By Mouth Qd 6)  Norvasc 5 Mg  Tabs (Amlodipine Besylate) .Marland Kitchen.. 1 By Mouth Once Daily 7)  Metformin Hcl 500 Mg  Tabs (Metformin Hcl) .... 2 By Mouth Once Daily 8)  Toprol Xl 50 Mg  Tb24 (Metoprolol Succinate) .Marland Kitchen.. 1 1/2 Tab Once Daily 9)  Freestyle Lite Test  Strp (Glucose Blood) .... Administer 1 Strip Once Daily As Directed 10)  Freestyle Lancets  Misc (Lancets) .... Use As Directed 11)  Fluticasone Propionate 50 Mcg/act Susp (Fluticasone Propionate) .... 2 Puffs Two Times A Day As Needed 12)  Triamcinolone Acetonide 0.1 % Crea (Triamcinolone Acetonide) .... Use Asd Two Times A Day As Needed 13)  Clobetasol Propionate 0.05 % Oint (Clobetasol Propionate) .... Use Twice Once Daily To Hand and Feet As Needed 14)  Desonide 0.05 % Lotn (Desonide) .... Use Under Breast Two Times A Day As Needed 15)  Ketoconazole 2 % Crea (Ketoconazole) .... Use Asd Two Times A Day As Needed Under Breast 16)  Fluocinolone Acetonide 0.025 % Oint (Fluocinolone Acetonide) .... Use Asd Two Times A Day As Needed 17)  Advair Diskus 250-50 Mcg/dose Aepb (Fluticasone-Salmeterol) .Marland Kitchen.. 1 Puff Two Times A Day 18)  Allegra Otc .... Prn 19)  Zovirax 5 % Crea (Acyclovir) .... Apply To Affected Area Every 3 Hours As Needed 20)  Levofloxacin 500 Mg Tabs (Levofloxacin) .Marland Kitchen.. 1po Once Daily 21)  Hydrocodone-Homatropine 5-1.5 Mg/31ml Syrp (Hydrocodone-Homatropine) .Marland Kitchen.. 1 Tsp By Mouth Q 6 Hrs  As Needed Cough 22)  Prednisone 10 Mg Tabs (Prednisone) .... 3po Qd For 3days, Then 2po Qd For 3days, Then 1po Qd For 3days, Then Stop  Allergies (verified): 1)  ! Zocor  Past History:  Past Medical History: Last updated:  04/12/2010 DIABETES MELLITUS (ICD-250.00) DEGENERATIVE JOINT DISEASE (ICD-715.90) Hx of ADENOCARCINOMA, COLON, HX OF (ICD-V10.05) ARTHRITIS (ICD-716.90) OBESITY (ICD-278.00) PALPITATIONS (ICD-785.1) GLUCOSE INTOLERANCE (ICD-271.3) HYPERCHOLESTEROLEMIA (ICD-272.0) HYPERTENSION (ICD-401.9) CORONARY ARTERY DISEASE (ICD-414.00) GERD (ICD-530.81) COPD (ICD-496) chronic asthmatic bronchitis ASTHMA (ICD-493.90) ALLERGIC RHINITIS (ICD-477.9) Osteopenia hx of intestinal adhesions LVH psoriasis/eczema goiter  Past Surgical History: Last updated: 01/30/2008 Hysterectomy Percutaneous transluminal coronary angioplasty bone density 03/30/05 hernia repair tubal ligation rt hemicolectomy  Social History: Last updated: 10-31-10 Married 4 children - 1 died with meningitis work - stay at home Former Smoker Alcohol use-no Drug use-no  Risk Factors: Smoking Status: quit (06/25/2010)  Packs/Day: 1.0 (06/25/2010)  Social History: Married 4 children - 1 died with meningitis work - stay at home Former Smoker Alcohol use-no Drug use-no Drug Use:  no  Review of Systems       all otherwise negative per pt -    Physical Exam  General:  alert and overweight-appearing. - mild ill  Head:  normocephalic and atraumatic.   Eyes:  vision grossly intact, pupils equal, and pupils round.   Ears:  bilat tm;s midl erythema, sinus nontender Nose:  nasal dischargemucosal pallor and airflow obstruction.   Mouth:  pharyngeal erythema and fair dentition.   Neck:  supple and no masses.   Lungs:  normal respiratory effort, R decreased breath sounds, R wheezes, L decreased breath sounds, and L wheezes.   Heart:  normal rate and regular rhythm.   Extremities:  no edema, no erythema    Impression & Recommendations:  Problem # 1:  OSTEOPENIA (ICD-733.90)  The following medications were removed from the medication list:    Evista 60 Mg Tabs (Raloxifene hcl) .Marland Kitchen... 1 by mouth qd ok to be off  the evista for now, will hold on change ot fosamax as osteopenia mild , f/w dxa 2 yrs;  for diligence with excercise as able, cont calcium and vit d  Problem # 2:  DIABETES MELLITUS (ICD-250.00)  Her updated medication list for this problem includes:    Ecotrin 325 Mg Tbec (Aspirin) .Marland Kitchen... 1 by mouth qd    Metformin Hcl 500 Mg Tabs (Metformin hcl) .Marland Kitchen... 2 by mouth once daily  Orders: TLB-BMP (Basic Metabolic Panel-BMET) (80048-METABOL) TLB-A1C / Hgb A1C (Glycohemoglobin) (83036-A1C) TLB-Lipid Panel (80061-LIPID)  Labs Reviewed: Creat: 0.7 (04/06/2010)    Reviewed HgBA1c results: 6.3 (04/06/2010)  6.6 (09/29/2009) stable overall by hx and exam, ok to continue meds/tx as is , but follow closely on steroid tx (see below);  cont to monitor cbg's;  call for > 200 or onset polys  Problem # 3:  HYPERCHOLESTEROLEMIA (ICD-272.0)  Her updated medication list for this problem includes:    Lovastatin 40 Mg Tabs (Lovastatin) .Marland Kitchen... 2 by mouth qd  Labs Reviewed: SGOT: 17 (04/06/2010)   SGPT: 16 (04/06/2010)   HDL:41.90 (04/06/2010), 41.70 (09/29/2009)  LDL:82 (04/06/2010), 81 (09/29/2009)  Chol:144 (04/06/2010), 139 (09/29/2009)  Trig:101.0 (04/06/2010), 83.0 (09/29/2009) stable overall by hx and exam, ok to continue meds/tx as is , Pt to continue diet efforts, good med tolerance; to check labs - goal LDL less than 70   Problem # 4:  HYPERTENSION (ICD-401.9)  Her updated medication list for this problem includes:    Hydrochlorothiazide 25 Mg Tabs (Hydrochlorothiazide) .Marland Kitchen... 1po qd    Norvasc 5 Mg Tabs (Amlodipine besylate) .Marland Kitchen... 1 by mouth once daily    Toprol Xl 50 Mg Tb24 (Metoprolol succinate) .Marland Kitchen... 1 1/2 tab once daily  BP today: 122/70 Prior BP: 100/68 (06/25/2010)  Labs Reviewed: K+: 4.4 (04/06/2010) Creat: : 0.7 (04/06/2010)   Chol: 144 (04/06/2010)   HDL: 41.90 (04/06/2010)   LDL: 82 (04/06/2010)   TG: 101.0 (04/06/2010) stable overall by hx and exam, ok to continue meds/tx as is    Problem # 5:  BRONCHITIS-ACUTE (ICD-466.0)  The following medications were removed from the medication list:    Mucinex Maximum Strength 1200 Mg Tb12 (Guaifenesin) .Marland Kitchen... 2 tabs by mouth prn Her updated medication list for this problem includes:    Advair Diskus 250-50 Mcg/dose Aepb (Fluticasone-salmeterol) .Marland Kitchen... 1 puff two times a day    Levofloxacin 500 Mg Tabs (  Levofloxacin) .Marland Kitchen... 1po once daily    Hydrocodone-homatropine 5-1.5 Mg/92ml Syrp (Hydrocodone-homatropine) .Marland Kitchen... 1 tsp by mouth q 6 hrs  as needed cough treat as above, f/u any worsening signs or symptoms   Problem # 6:  WHEEZING (ICD-786.07) mild, for depomedrol IM today, pred pack for home, f/u any worsening s/s Orders: Depo- Medrol 40mg  (J1030) Depo- Medrol 80mg  (J1040) Admin of Therapeutic Inj  intramuscular or subcutaneous (36644)  Complete Medication List: 1)  Omeprazole 20 Mg Tbec (Omeprazole) .... Take 1 tablet by mouth once a day 2)  Lovastatin 40 Mg Tabs (Lovastatin) .... 2 by mouth qd 3)  Hydrochlorothiazide 25 Mg Tabs (Hydrochlorothiazide) .Marland Kitchen.. 1po qd 4)  Naproxen 500 Mg Tabs (Naproxen) .... Use as needed  two times a day as needed 5)  Ecotrin 325 Mg Tbec (Aspirin) .Marland Kitchen.. 1 by mouth qd 6)  Norvasc 5 Mg Tabs (Amlodipine besylate) .Marland Kitchen.. 1 by mouth once daily 7)  Metformin Hcl 500 Mg Tabs (Metformin hcl) .... 2 by mouth once daily 8)  Toprol Xl 50 Mg Tb24 (Metoprolol succinate) .Marland Kitchen.. 1 1/2 tab once daily 9)  Freestyle Lite Test Strp (Glucose blood) .... Administer 1 strip once daily as directed 10)  Freestyle Lancets Misc (Lancets) .... Use as directed 11)  Fluticasone Propionate 50 Mcg/act Susp (Fluticasone propionate) .... 2 puffs two times a day as needed 12)  Triamcinolone Acetonide 0.1 % Crea (Triamcinolone acetonide) .... Use asd two times a day as needed 13)  Clobetasol Propionate 0.05 % Oint (Clobetasol propionate) .... Use twice once daily to hand and feet as needed 14)  Desonide 0.05 % Lotn (Desonide) ....  Use under breast two times a day as needed 15)  Ketoconazole 2 % Crea (Ketoconazole) .... Use asd two times a day as needed under breast 16)  Fluocinolone Acetonide 0.025 % Oint (Fluocinolone acetonide) .... Use asd two times a day as needed 17)  Advair Diskus 250-50 Mcg/dose Aepb (Fluticasone-salmeterol) .Marland Kitchen.. 1 puff two times a day 18)  Allegra Otc  .... Prn 19)  Zovirax 5 % Crea (Acyclovir) .... Apply to affected area every 3 hours as needed 20)  Levofloxacin 500 Mg Tabs (Levofloxacin) .Marland Kitchen.. 1po once daily 21)  Hydrocodone-homatropine 5-1.5 Mg/27ml Syrp (Hydrocodone-homatropine) .Marland Kitchen.. 1 tsp by mouth q 6 hrs  as needed cough 22)  Prednisone 10 Mg Tabs (Prednisone) .... 3po qd for 3days, then 2po qd for 3days, then 1po qd for 3days, then stop  Patient Instructions: 1)  please stop the evista 2)  start the calium plus Vit D 3)  you had the steroid shot today 4)  Please take all new medications as prescribed 5)  Continue all previous medications as before this visit 6)  Please go to the Lab in the basement for your blood and/or urine tests today 7)  Please call the number on the [facility] Card for results of your testing  8)  Please schedule a follow-up appointment in 6 months with CPX labs and: 9)  HbgA1C prior to visit, ICD-9: 250.02 10)  Urine Microalbumin prior to visit, ICD-9: Prescriptions: PREDNISONE 10 MG TABS (PREDNISONE) 3po qd for 3days, then 2po qd for 3days, then 1po qd for 3days, then stop  #18 x 0   Entered and Authorized by:   Corwin Levins MD   Signed by:   Corwin Levins MD on 10/19/2010   Method used:   Print then Give to Patient   RxID:   [id number] HYDROCODONE-HOMATROPINE 5-1.5 MG/5ML SYRP (HYDROCODONE-HOMATROPINE) 1 tsp by  mouth q 6 hrs  as needed cough  #6oz x 1   Entered and Authorized by:   Corwin Levins MD   Signed by:   Corwin Levins MD on 10/19/2010   Method used:   Print then Give to Patient   RxID:   [id number] LEVOFLOXACIN 500 MG TABS (LEVOFLOXACIN) 1po  once daily  #10 x 0   Entered and Authorized by:   Corwin Levins MD   Signed by:   Corwin Levins MD on 10/19/2010   Method used:   Print then Give to Patient   RxID:   [id number] FREESTYLE LANCETS  MISC (LANCETS) use as directed  #100 x 11   Entered and Authorized by:   Corwin Levins MD   Signed by:   Corwin Levins MD on 10/19/2010   Method used:   Print then Give to Patient   RxID:   [id number] FREESTYLE LITE TEST  STRP (GLUCOSE BLOOD) administer 1 strip once daily as directed  #100 x 11   Entered and Authorized by:   Corwin Levins MD   Signed by:   Corwin Levins MD on 10/19/2010   Method used:   Print then Give to Patient   RxID:   [id number]    Medication Administration  Injection # 1:    Medication: Depo- Medrol 40mg     Diagnosis: WHEEZING (ICD-786.07)    Route: IM    Site: LUOQ gluteus    Exp Date: 04/2013    Lot #: [id number]    Mfr: [employer]    Comments: Patient received 120mg  Depo-Medrol    Patient tolerated injection without complications    Given by: Zella Ball Ewing CMA Duncan Dull) (October 19, 2010 10:59 AM)  Injection # 2:    Medication: Depo- Medrol 80mg     Diagnosis: WHEEZING (ICD-786.07)    Route: IM    Site: LUOQ gluteus    Exp Date: 04/2013    Lot #: [id number]    Mfr: [employer]    Given by: Zella Ball Ewing CMA (AAMA) (October 19, 2010 10:59 AM)  Orders Added: 1)  Depo- Medrol 40mg  [J1030] 2)  Depo- Medrol 80mg  [J1040] 3)  Admin of Therapeutic Inj  intramuscular or subcutaneous [96372] 4)  TLB-BMP (Basic Metabolic Panel-BMET) [80048-METABOL] 5)  TLB-A1C / Hgb A1C (Glycohemoglobin) [83036-A1C] 6)  TLB-Lipid Panel [80061-LIPID] 7)  Est. Patient Level IV [01601]

## 2011-01-13 NOTE — Assessment & Plan Note (Signed)
Summary: 4 months/apc   Primary Provider/Referring Provider:  Oliver Barre  CC:  Asthma follow-up.  The patient says her breathing is doing good.  She denies any sob or cough or wheezing.  [name] [name]  History of Present Illness: 65 yo AAF  known history of  asthmatic bronchitis GERD and a component of VCD.   Worse during spring, fall & extreme heat. On xopenex MDI due to after taste & palpitations with albuterol   9/09--required 2 rounds of Abx & prednisone to resolve congestion  4/10-required prednisone dosepak    February 16, 2010 2:34 PM  URI in nov, required pred dosepak. Now well. Skipping advair doses if she feels well. Denies chest pain, dyspnea, orthopnea, hemoptysis, fever, n/v/d, edema, headache,recent travel or antibiotics, nocturnal awakening. Rare use of albuterol inhaler.   Current Medications (verified): 1)  Omeprazole 20 Mg  Tbec (Omeprazole) .... Take 1 Tablet By Mouth Once A Day 2)  Advair Diskus 500-50 Mcg/dose Misc (Fluticasone-Salmeterol) .... Inhale 1 Puff As Directed Twice A Day 3)  Lovastatin 40 Mg  Tabs (Lovastatin) .... 2 By Mouth Qd 4)  Fexofenadine Hcl 180 Mg Tabs (Fexofenadine Hcl) .[name] [name].. 1 Once Daily 5)  Hydrochlorothiazide 25 Mg Tabs (Hydrochlorothiazide) .[name] [name].. 1po Qd 6)  Naproxen 500 Mg Tabs (Naproxen) .... Use As Needed  Two Times A Day As Needed 7)  Ecotrin 325 Mg  Tbec (Aspirin) .[name] [name].. 1 By Mouth Qd 8)  Evista 60 Mg  Tabs (Raloxifene Hcl) .[name] [name].. 1 By Mouth Qd 9)  Norvasc 5 Mg  Tabs (Amlodipine Besylate) .[name] [name].. 1 By Mouth Once Daily 10)  Metformin Hcl 500 Mg  Tabs (Metformin Hcl) .... 2 By Mouth Once Daily 11)  Toprol Xl 50 Mg  Tb24 (Metoprolol Succinate) .[name] [name].. 1 1/2 Tab Once Daily 12)  Mucinex Maximum Strength 1200 Mg  Tb12 (Guaifenesin) .... 2 Tabs By Mouth Prn 13)  Freestyle Lite Test  Strp (Glucose Blood) .... Administer 1 Strip Once Daily As Directed 14)  Freestyle Lancets  Misc (Lancets) .... Use As Directed 15)  Fluticasone Propionate 50 Mcg/act Susp (Fluticasone  Propionate) .... 2 Puffs Two Times A Day 16)  Triamcinolone Acetonide 0.1 % Crea (Triamcinolone Acetonide) .... Use Asd Two Times A Day As Needed 17)  Clobetasol Propionate 0.05 % Oint (Clobetasol Propionate) .... Use Twice Once Daily To Hand and Feet As Needed 18)  Desonide 0.05 % Lotn (Desonide) .... Use Under Breast Two Times A Day As Needed 19)  Ketoconazole 2 % Crea (Ketoconazole) .... Use Asd Two Times A Day As Needed Under Breast 20)  Fluocinolone Acetonide 0.025 % Oint (Fluocinolone Acetonide) .... Use Asd Two Times A Day As Needed  Allergies (verified): 1)  ! Zocor  Past History:  Past Medical History: Last updated: 10/07/2009 DIABETES MELLITUS (ICD-250.00) DEGENERATIVE JOINT DISEASE (ICD-715.90) Hx of ADENOCARCINOMA, COLON, HX OF (ICD-V10.05) ARTHRITIS (ICD-716.90) OBESITY (ICD-278.00) PALPITATIONS (ICD-785.1) GLUCOSE INTOLERANCE (ICD-271.3) HYPERCHOLESTEROLEMIA (ICD-272.0) HYPERTENSION (ICD-401.9) CORONARY ARTERY DISEASE (ICD-414.00) GERD (ICD-530.81) COPD (ICD-496) chronic asthmatic bronchitis ASTHMA (ICD-493.90) ALLERGIC RHINITIS (ICD-477.9) Osteopenia hx of intestinal adhesions LVH psoriasis/eczema  Social History: Last updated: 2008-04-20 Married 4 children - 1 died with meningitis work - stay at home Former Smoker Alcohol use-no  Review of Systems  The patient denies anorexia, fever, weight loss, weight gain, vision loss, decreased hearing, hoarseness, chest pain, syncope, dyspnea on exertion, peripheral edema, prolonged cough, headaches, hemoptysis, abdominal pain, melena, hematochezia, severe indigestion/heartburn, hematuria, muscle weakness, transient blindness, difficulty walking, depression, unusual weight change, and abnormal bleeding.  Vital Signs:  Patient profile:   66 year old female Height:      63 inches (160.02 cm) Weight:      162.25 pounds (73.75 kg) BMI:     28.85 O2 Sat:      95 % on Room air Temp:     97.7 degrees F (36.50  degrees C) oral Pulse rate:   83 / minute BP sitting:   112 / 70 Cuff size:   regular  Vitals Entered By: Michel Bickers CMA (February 16, 2010 2:24 PM)  O2 Sat at Rest %:  95 O2 Flow:  Room air CC: Asthma follow-up.  The patient says her breathing is doing good.  She denies any sob or cough or wheezing.   Comments Medications reviewed with the patient. Verified home telephone number with patient. Michel Bickers CMA  February 16, 2010 2:29 PM   Physical Exam  Additional Exam:  Gen. Pleasant, well-nourished, in no distress ENT - no lesions, no post nasal drip Neck: No JVD, no thyromegaly, no carotid bruits Lungs: no use of accessory muscles, no dullness to percussion, clear without rales or rhonchi  Cardiovascular: Rhythm regular, heart sounds  normal, no murmurs or gallops, no peripheral edema Musculoskeletal: No deformities, no cyanosis or clubbing      Impression & Recommendations:  Problem # 1:  ASTHMA (ICD-493.90) Attempt step down , decrease to advair 250/50 two times a day  Advised against skipping doses. Discussed adverse effects of steroids in general  Problem # 2:  GERD (ICD-530.81) Assessment: Comment Only  Her updated medication list for this problem includes:    Omeprazole 20 Mg Tbec (Omeprazole) .[name] [name]... Take 1 tablet by mouth once a day  Orders: Est. Patient Level III (04540) Prescription Created Electronically [phone])  Medications Added to Medication List This Visit: 1)  Advair Diskus 250-50 Mcg/dose Aepb (Fluticasone-salmeterol) .... Two times a day  Patient Instructions: 1)  Copy sent to:D John 2)  Please schedule a follow-up appointment in 4 months. 3)  Decrease advair to 250//50  two times a day  Prescriptions: ADVAIR DISKUS 250-50 MCG/DOSE AEPB (FLUTICASONE-SALMETEROL) two times a day  #1 x 3   Entered and Authorized by:   Comer Locket Vassie Loll MD   Signed by:   Comer Locket Vassie Loll MD on 02/16/2010   Method used:   Print then Give to Patient   RxID:    [id number]

## 2011-01-13 NOTE — Progress Notes (Signed)
Summary: Bone Density  ---- Converted from flag ---- ---- 06/22/2010 12:21 PM, Verdell Face wrote: Triage,  Ozella Almond-- called and states she rec'd card from bone density to schedule Bone Density.  She had one a couple years ago. Her insurance, [facility] requires somebody to call [phone],insurance also told pt it would have to be go under diagnostic/theraputic services to be covered and maybe a referral   Elnita Maxwell [phone] ------------------------------  Phone Note Call from Patient Call back at Home Phone [phone]   Caller: Patient Summary of Call: Dr. Jonny Ruiz, can you put in an order for pt to be scheduled for bone density. [facility] will know if pt's Insurance company needs to be contacted or not. Initial call taken by: Margaret Pyle, CMA,  June 22, 2010 1:04 PM  Follow-up for Phone Call        done hardcopy to LIM side B - dahlia  Follow-up by: Corwin Levins MD,  June 22, 2010 1:15 PM  Additional Follow-up for Phone Call Additional follow up Details #1::        forwarded to Harriett Sine to sch Additional Follow-up by: Margaret Pyle, CMA,  June 22, 2010 1:31 PM

## 2011-01-13 NOTE — Miscellaneous (Signed)
Summary: Immunization Entry   Immunization History:  Influenza Immunization History:    Influenza:  historical (08/25/2010)  [facility] [place] [address]. [place] Vaccine, Fluvirin Mfg. [employer] Site, Left Deltoid IM Lot #[id number] Exp. 04/2011 Date Administered 08/25/2010

## 2011-01-13 NOTE — Assessment & Plan Note (Signed)
Summary: DISCUSS THYROID/NWS   Vital Signs:  Patient profile:   65 year old female Height:      64 inches Weight:      161.13 pounds BMI:     27.76 O2 Sat:      96 % on Room air Temp:     98.2 degrees F oral Pulse rate:   75 / minute BP sitting:   110 / 60  (left arm) Cuff size:   regular  Vitals Entered ByZella Ball Ewing (May 07, 2010 2:05 PM)  O2 Flow:  Room air CC: Discuss Thyroid/RE   Primary Care Provider:  Oliver Barre  CC:  Discuss Thyroid/RE.  History of Present Illness: here to f/u; since last visit her primary concern is that a lump to the right neck that she believes is assoc with the thyroid is larger;  she has sister s/p thyroid surgury and brother on thyroid replacement so very concerned about her tendency to developeing thyroid problem;  the enlargement has come up in the last 2 to 3 wks, is mild tender to palpate and not gotten larger in the past wk, infact may have gottten smaller;  she c/o assoc general weakness, dull ache to the right neck, "pulling" sensation while singing at church, and several night sweats recently.  Pt denies CP, sob, doe, wheezing, orthopnea, pnd, worsening LE edema, palps, dizziness or syncope   Pt denies new neuro symptoms such as headache, facial or extremity weakness  Denies dysphagia or other swallowing problem.  No fever, wt loss.    Problems Prior to Update: 1)  Goiter, Unspecified  (ICD-240.9) 2)  Urinary Frequency  (ICD-788.41) 3)  Asthma, With Acute Exacerbation  (ICD-493.92) 4)  Hx of Colon Cancer  (ICD-10.05) 5)  Preventive Health Care  (ICD-V70.0) 6)  Osteopenia  (ICD-733.90) 7)  Diabetes Mellitus  (ICD-250.00) 8)  Degenerative Joint Disease  (ICD-715.90) 9)  Hx of Adenocarcinoma, Colon, Hx of  (ICD-V10.05) 10)  Arthritis  (ICD-716.90) 11)  Obesity  (ICD-278.00) 12)  Palpitations  (ICD-785.1) 13)  Glucose Intolerance  (ICD-271.3) 14)  Hypercholesterolemia  (ICD-272.0) 15)  Hypertension  (ICD-401.9) 16)  Coronary Artery  Disease  (ICD-414.00) 17)  Gerd  (ICD-530.81) 18)  COPD  (ICD-496) 19)  Asthma  (ICD-493.90) 20)  Allergic Rhinitis  (ICD-477.9)  Medications Prior to Update: 1)  Omeprazole 20 Mg  Tbec (Omeprazole) .... Take 1 Tablet By Mouth Once A Day 2)  Lovastatin 40 Mg  Tabs (Lovastatin) .... 2 By Mouth Qd 3)  Fexofenadine Hcl 180 Mg Tabs (Fexofenadine Hcl) .Marland Kitchen.. 1 Once Daily 4)  Hydrochlorothiazide 25 Mg Tabs (Hydrochlorothiazide) .Marland Kitchen.. 1po Qd 5)  Naproxen 500 Mg Tabs (Naproxen) .... Use As Needed  Two Times A Day As Needed 6)  Ecotrin 325 Mg  Tbec (Aspirin) .Marland Kitchen.. 1 By Mouth Qd 7)  Evista 60 Mg  Tabs (Raloxifene Hcl) .Marland Kitchen.. 1 By Mouth Qd 8)  Norvasc 5 Mg  Tabs (Amlodipine Besylate) .Marland Kitchen.. 1 By Mouth Once Daily 9)  Metformin Hcl 500 Mg  Tabs (Metformin Hcl) .... 2 By Mouth Once Daily 10)  Toprol Xl 50 Mg  Tb24 (Metoprolol Succinate) .Marland Kitchen.. 1 1/2 Tab Once Daily 11)  Mucinex Maximum Strength 1200 Mg  Tb12 (Guaifenesin) .... 2 Tabs By Mouth Prn 12)  Freestyle Lite Test  Strp (Glucose Blood) .... Administer 1 Strip Once Daily As Directed 13)  Freestyle Lancets  Misc (Lancets) .... Use As Directed 14)  Fluticasone Propionate 50 Mcg/act Susp (Fluticasone Propionate) .... 2 Puffs  Two Times A Day 15)  Triamcinolone Acetonide 0.1 % Crea (Triamcinolone Acetonide) .... Use Asd Two Times A Day As Needed 16)  Clobetasol Propionate 0.05 % Oint (Clobetasol Propionate) .... Use Twice Once Daily To Hand and Feet As Needed 17)  Desonide 0.05 % Lotn (Desonide) .... Use Under Breast Two Times A Day As Needed 18)  Ketoconazole 2 % Crea (Ketoconazole) .... Use Asd Two Times A Day As Needed Under Breast 19)  Fluocinolone Acetonide 0.025 % Oint (Fluocinolone Acetonide) .... Use Asd Two Times A Day As Needed  Current Medications (verified): 1)  Omeprazole 20 Mg  Tbec (Omeprazole) .... Take 1 Tablet By Mouth Once A Day 2)  Lovastatin 40 Mg  Tabs (Lovastatin) .... 2 By Mouth Qd 3)  Fexofenadine Hcl 180 Mg Tabs (Fexofenadine Hcl)  .Marland Kitchen.. 1 Once Daily 4)  Hydrochlorothiazide 25 Mg Tabs (Hydrochlorothiazide) .Marland Kitchen.. 1po Qd 5)  Naproxen 500 Mg Tabs (Naproxen) .... Use As Needed  Two Times A Day As Needed 6)  Ecotrin 325 Mg  Tbec (Aspirin) .Marland Kitchen.. 1 By Mouth Qd 7)  Evista 60 Mg  Tabs (Raloxifene Hcl) .Marland Kitchen.. 1 By Mouth Qd 8)  Norvasc 5 Mg  Tabs (Amlodipine Besylate) .Marland Kitchen.. 1 By Mouth Once Daily 9)  Metformin Hcl 500 Mg  Tabs (Metformin Hcl) .... 2 By Mouth Once Daily 10)  Toprol Xl 50 Mg  Tb24 (Metoprolol Succinate) .Marland Kitchen.. 1 1/2 Tab Once Daily 11)  Mucinex Maximum Strength 1200 Mg  Tb12 (Guaifenesin) .... 2 Tabs By Mouth Prn 12)  Freestyle Lite Test  Strp (Glucose Blood) .... Administer 1 Strip Once Daily As Directed 13)  Freestyle Lancets  Misc (Lancets) .... Use As Directed 14)  Fluticasone Propionate 50 Mcg/act Susp (Fluticasone Propionate) .... 2 Puffs Two Times A Day 15)  Triamcinolone Acetonide 0.1 % Crea (Triamcinolone Acetonide) .... Use Asd Two Times A Day As Needed 16)  Clobetasol Propionate 0.05 % Oint (Clobetasol Propionate) .... Use Twice Once Daily To Hand and Feet As Needed 17)  Desonide 0.05 % Lotn (Desonide) .... Use Under Breast Two Times A Day As Needed 18)  Ketoconazole 2 % Crea (Ketoconazole) .... Use Asd Two Times A Day As Needed Under Breast 19)  Fluocinolone Acetonide 0.025 % Oint (Fluocinolone Acetonide) .... Use Asd Two Times A Day As Needed 20)  Advair Diskus 250-50 Mcg/dose Aepb (Fluticasone-Salmeterol) .Marland Kitchen.. 1 Puff Two Times A Day  Allergies (verified): 1)  ! Zocor  Past History:  Past Medical History: Last updated: 04/12/2010 DIABETES MELLITUS (ICD-250.00) DEGENERATIVE JOINT DISEASE (ICD-715.90) Hx of ADENOCARCINOMA, COLON, HX OF (ICD-V10.05) ARTHRITIS (ICD-716.90) OBESITY (ICD-278.00) PALPITATIONS (ICD-785.1) GLUCOSE INTOLERANCE (ICD-271.3) HYPERCHOLESTEROLEMIA (ICD-272.0) HYPERTENSION (ICD-401.9) CORONARY ARTERY DISEASE (ICD-414.00) GERD (ICD-530.81) COPD (ICD-496) chronic asthmatic  bronchitis ASTHMA (ICD-493.90) ALLERGIC RHINITIS (ICD-477.9) Osteopenia hx of intestinal adhesions LVH psoriasis/eczema goiter  Past Surgical History: Last updated: 01/30/2008 Hysterectomy Percutaneous transluminal coronary angioplasty bone density 03/30/05 hernia repair tubal ligation rt hemicolectomy  Social History: Last updated: Apr 18, 2008 Married 4 children - 1 died with meningitis work - stay at home Former Smoker Alcohol use-no  Risk Factors: Smoking Status: quit (04/18/08)  Review of Systems       all otherwise negative per pt -    Physical Exam  General:  alert and overweight-appearing. - mild Head:  normocephalic and atraumatic.   Eyes:  vision grossly intact, pupils equal, and pupils round.   Ears:  R ear normal and L ear normal.   Nose:  no external deformity and no nasal discharge.  Mouth:  no gingival abnormalities and pharynx pink and moist.   Neck:  supple and full ROM., approx 1 cm subq mild tender nodular lesion to the right neck either assoc or just lateral to the right thyroid lobe;  no other assoc lump, LA noted bilat neck or submandibular Lungs:  normal respiratory effort and normal breath sounds.   Heart:  normal rate and regular rhythm.   Msk:  incidently with right sternoclavicular joint tender adn mild soft and bony tissue swelling Extremities:  no edema, no erythema    Impression & Recommendations:  Problem # 1:  GOITER, UNSPECIFIED (ICD-240.9)  with ? increease size right thyroid nodule vs lymph node vs other ? - for thyroid ultrasound, and follow closely for worsening; is currently afeb and exam o/w benign - to hold on antibx at this time  Orders: Misc. Referral (Misc. Ref)  Problem # 2:  HYPERTENSION (ICD-401.9)  Her updated medication list for this problem includes:    Hydrochlorothiazide 25 Mg Tabs (Hydrochlorothiazide) .Marland Kitchen... 1po qd    Norvasc 5 Mg Tabs (Amlodipine besylate) .Marland Kitchen... 1 by mouth once daily    Toprol Xl 50 Mg  Tb24 (Metoprolol succinate) .Marland Kitchen... 1 1/2 tab once daily  BP today: 110/60 Prior BP: 104/64 (04/12/2010)  Labs Reviewed: K+: 4.4 (04/06/2010) Creat: : 0.7 (04/06/2010)   Chol: 144 (04/06/2010)   HDL: 41.90 (04/06/2010)   LDL: 82 (04/06/2010)   TG: 101.0 (04/06/2010) stable overall by hx and exam, ok to continue meds/tx as is   Complete Medication List: 1)  Omeprazole 20 Mg Tbec (Omeprazole) .... Take 1 tablet by mouth once a day 2)  Lovastatin 40 Mg Tabs (Lovastatin) .... 2 by mouth qd 3)  Fexofenadine Hcl 180 Mg Tabs (Fexofenadine hcl) .Marland Kitchen.. 1 once daily 4)  Hydrochlorothiazide 25 Mg Tabs (Hydrochlorothiazide) .Marland Kitchen.. 1po qd 5)  Naproxen 500 Mg Tabs (Naproxen) .... Use as needed  two times a day as needed 6)  Ecotrin 325 Mg Tbec (Aspirin) .Marland Kitchen.. 1 by mouth qd 7)  Evista 60 Mg Tabs (Raloxifene hcl) .Marland Kitchen.. 1 by mouth qd 8)  Norvasc 5 Mg Tabs (Amlodipine besylate) .Marland Kitchen.. 1 by mouth once daily 9)  Metformin Hcl 500 Mg Tabs (Metformin hcl) .... 2 by mouth once daily 10)  Toprol Xl 50 Mg Tb24 (Metoprolol succinate) .Marland Kitchen.. 1 1/2 tab once daily 11)  Mucinex Maximum Strength 1200 Mg Tb12 (Guaifenesin) .... 2 tabs by mouth prn 12)  Freestyle Lite Test Strp (Glucose blood) .... Administer 1 strip once daily as directed 13)  Freestyle Lancets Misc (Lancets) .... Use as directed 14)  Fluticasone Propionate 50 Mcg/act Susp (Fluticasone propionate) .... 2 puffs two times a day 15)  Triamcinolone Acetonide 0.1 % Crea (Triamcinolone acetonide) .... Use asd two times a day as needed 16)  Clobetasol Propionate 0.05 % Oint (Clobetasol propionate) .... Use twice once daily to hand and feet as needed 17)  Desonide 0.05 % Lotn (Desonide) .... Use under breast two times a day as needed 18)  Ketoconazole 2 % Crea (Ketoconazole) .... Use asd two times a day as needed under breast 19)  Fluocinolone Acetonide 0.025 % Oint (Fluocinolone acetonide) .... Use asd two times a day as needed 20)  Advair Diskus 250-50 Mcg/dose Aepb  (Fluticasone-salmeterol) .Marland Kitchen.. 1 puff two times a day  Patient Instructions: 1)  You will be contacted about the referral(s) to: thyroid ultrasound 2)  Continue all previous medications as before this visit  3)  Please schedule a follow-up appointment as  needed.

## 2011-01-13 NOTE — Progress Notes (Signed)
  Phone Note Refill Request  on May 26, 2010 3:04 PM  Refills Requested: Medication #1:  HYDROCHLOROTHIAZIDE 25 MG TABS 1po qd   Dosage confirmed as above?Dosage Confirmed   Notes: [facility] Initial call taken by: Scharlene Gloss,  May 26, 2010 3:04 PM    Prescriptions: HYDROCHLOROTHIAZIDE 25 MG TABS (HYDROCHLOROTHIAZIDE) 1po qd  #100 Tablet x 3   Entered by:   Scharlene Gloss   Authorized by:   Corwin Levins MD   Signed by:   Scharlene Gloss on 05/26/2010   Method used:   Faxed to ...       [facility]  [address] [phone]* (retail)       [address]       [place], [place]  [address]       Ph: [phone] or [phone]       Fax: [phone]   RxID:   [id number]

## 2011-01-13 NOTE — Miscellaneous (Signed)
Summary: BONE DENSITY  Clinical Lists Changes  Orders: Added new Test order of T-Lumbar Vertebral Assessment (77082) - Signed 

## 2011-01-13 NOTE — Progress Notes (Signed)
----   Converted from flag ---- ---- 05/17/2010 2:18 PM, Hilarie Fredrickson wrote: SHE MADE AN APPT FOR FRIDAY JUNE 10 '@ 2:30.  ---- 05/14/2010 1:00 PM, Dagoberto Reef wrote: Pls schedule with Dr Everardo All.  Thanks  ---- 05/14/2010 12:01 PM, Corwin Levins MD wrote: The following orders have been entered for this patient and placed on Admin Hold:  Type:     Referral       Code:   Endocrine Description:   Endocrinology Referral Order Date:   05/14/2010   Authorized By:   Corwin Levins MD Order #:   [id number] Clinical Notes:   dr Everardo All ------------------------------

## 2011-01-13 NOTE — Miscellaneous (Signed)
Summary: Orders Update   Clinical Lists Changes  Orders: Added new Referral order of Endocrinology Referral (Endocrine) - Signed 

## 2011-01-13 NOTE — Assessment & Plan Note (Signed)
Summary: 4 months/apc   Primary Provider/Referring Provider:  Oliver Barre  CC:  Pt here for follow up. Pt state breathing is the same.  History of Present Illness: 65 yo AAF, ex smoker with  asthmatic bronchitis, GERD and a component of VCD.   Worse during spring, fall & extreme heat. On xopenex MDI due to after taste & palpitations with albuterol   9/09--required 2 rounds of Abx & prednisone to resolve congestion  4/10 & 11/10-required prednisone dosepak  June 25, 2010 10:14 AM  Denies chest pain, dyspnea, orthopnea, hemoptysis, fever, n/v/d, edema, headache,recent travel or antibiotics, nocturnal awakening. Rare use of albuterol inhaler. Takes advair as needed but is resistant to decreasing dose to 100/50. No heartburn  Preventive Screening-Counseling & Management  Alcohol-Tobacco     Smoking Status: quit     Packs/Day: 1.0     Year Started: 1958     Year Quit: 1998     Pack years: 40  Current Medications (verified): 1)  Omeprazole 20 Mg  Tbec (Omeprazole) .... Take 1 Tablet By Mouth Once A Day 2)  Lovastatin 40 Mg  Tabs (Lovastatin) .... 2 By Mouth Qd 3)  Hydrochlorothiazide 25 Mg Tabs (Hydrochlorothiazide) .Marland Kitchen.. 1po Qd 4)  Naproxen 500 Mg Tabs (Naproxen) .... Use As Needed  Two Times A Day As Needed 5)  Ecotrin 325 Mg  Tbec (Aspirin) .Marland Kitchen.. 1 By Mouth Qd 6)  Evista 60 Mg  Tabs (Raloxifene Hcl) .Marland Kitchen.. 1 By Mouth Qd 7)  Norvasc 5 Mg  Tabs (Amlodipine Besylate) .Marland Kitchen.. 1 By Mouth Once Daily 8)  Metformin Hcl 500 Mg  Tabs (Metformin Hcl) .... 2 By Mouth Once Daily 9)  Toprol Xl 50 Mg  Tb24 (Metoprolol Succinate) .Marland Kitchen.. 1 1/2 Tab Once Daily 10)  Mucinex Maximum Strength 1200 Mg  Tb12 (Guaifenesin) .... 2 Tabs By Mouth Prn 11)  Freestyle Lite Test  Strp (Glucose Blood) .... Administer 1 Strip Once Daily As Directed 12)  Freestyle Lancets  Misc (Lancets) .... Use As Directed 13)  Fluticasone Propionate 50 Mcg/act Susp (Fluticasone Propionate) .... 2 Puffs Two Times A Day As Needed 14)   Triamcinolone Acetonide 0.1 % Crea (Triamcinolone Acetonide) .... Use Asd Two Times A Day As Needed 15)  Clobetasol Propionate 0.05 % Oint (Clobetasol Propionate) .... Use Twice Once Daily To Hand and Feet As Needed 16)  Desonide 0.05 % Lotn (Desonide) .... Use Under Breast Two Times A Day As Needed 17)  Ketoconazole 2 % Crea (Ketoconazole) .... Use Asd Two Times A Day As Needed Under Breast 18)  Fluocinolone Acetonide 0.025 % Oint (Fluocinolone Acetonide) .... Use Asd Two Times A Day As Needed 19)  Advair Diskus 250-50 Mcg/dose Aepb (Fluticasone-Salmeterol) .Marland Kitchen.. 1 Puff Two Times A Day 20)  Allegra Otc .... Prn  Allergies (verified): 1)  ! Zocor  Past History:  Past Medical History: Last updated: 04/12/2010 DIABETES MELLITUS (ICD-250.00) DEGENERATIVE JOINT DISEASE (ICD-715.90) Hx of ADENOCARCINOMA, COLON, HX OF (ICD-V10.05) ARTHRITIS (ICD-716.90) OBESITY (ICD-278.00) PALPITATIONS (ICD-785.1) GLUCOSE INTOLERANCE (ICD-271.3) HYPERCHOLESTEROLEMIA (ICD-272.0) HYPERTENSION (ICD-401.9) CORONARY ARTERY DISEASE (ICD-414.00) GERD (ICD-530.81) COPD (ICD-496) chronic asthmatic bronchitis ASTHMA (ICD-493.90) ALLERGIC RHINITIS (ICD-477.9) Osteopenia hx of intestinal adhesions LVH psoriasis/eczema goiter  Social History: Last updated: 17-Apr-2008 Married 4 children - 1 died with meningitis work - stay at home Former Smoker Alcohol use-no  Social History: Packs/Day:  1.0  Review of Systems  The patient denies anorexia, fever, weight loss, weight gain, vision loss, decreased hearing, hoarseness, chest pain, syncope, dyspnea on exertion, peripheral edema,  prolonged cough, headaches, hemoptysis, abdominal pain, melena, hematochezia, severe indigestion/heartburn, hematuria, muscle weakness, suspicious skin lesions, difficulty walking, depression, unusual weight change, and abnormal bleeding.    Vital Signs:  Patient profile:   65 year old female Height:      64 inches Weight:       157.6 pounds BMI:     27.15 O2 Sat:      98 % on Room air Temp:     98.3 degrees F oral Pulse rate:   68 / minute BP sitting:   100 / 68  (left arm) Cuff size:   regular  Vitals Entered By: Zackery Barefoot CMA (June 25, 2010 10:00 AM)  O2 Flow:  Room air CC: Pt here for follow up. Pt state breathing is the same Comments Medications reviewed with patient Verified contact number and pharmacy with patient Zackery Barefoot CMA  June 25, 2010 10:02 AM    Physical Exam  Additional Exam:  Gen. Pleasant, well-nourished, in no distress ENT - no lesions, no post nasal drip Neck: No JVD, no thyromegaly, no carotid bruits Lungs: no use of accessory muscles, no dullness to percussion, clear without rales or rhonchi  Cardiovascular: Rhythm regular, heart sounds  normal, no murmurs or gallops, no peripheral edema Musculoskeletal: No deformities, no cyanosis or clubbing      Impression & Recommendations:  Problem # 1:  ASTHMA (ICD-493.90)  stable overall by hx and exam, ok to continue meds/tx as is, does no require further meds at this time decrease to advair 100/50 in the future  Problem # 2:  GERD (ICD-530.81)  Her updated medication list for this problem includes:    Omeprazole 20 Mg Tbec (Omeprazole) .Marland Kitchen... Take 1 tablet by mouth once a day  Orders: Est. Patient Level III (60454)  Medications Added to Medication List This Visit: 1)  Fluticasone Propionate 50 Mcg/act Susp (Fluticasone propionate) .... 2 puffs two times a day as needed 2)  Allegra Otc  .... Prn  Patient Instructions: 1)  Copy sent to: Dr Jonny Ruiz 2)  Please schedule a follow-up appointment in 6 months with TP

## 2011-01-13 NOTE — Assessment & Plan Note (Signed)
Summary: NEW ENDO CONSULT/ GOITER/ NWS  #   Vital Signs:  Patient profile:   65 year old female Height:      64 inches Weight:      160.50 pounds BMI:     27.65 O2 Sat:      96 % on Room air Temp:     98.8 degrees F oral Pulse rate:   87 / minute BP sitting:   122 / 56  (left arm) Cuff size:   regular  Vitals Entered By: Lucious Groves (May 21, 2010 2:32 PM)  O2 Flow:  Room air CC: New endo consult--neck discomfort and nodules, ?Goiter./kb Is Patient Diabetic? Yes Pain Assessment Patient in pain? no      Comments Patient notes that she is not taking Fexofenadine and it should be removed from her list./kb   Primary Provider:  Oliver Barre  CC:  New endo consult--neck discomfort and nodules and ?Goiter./kb.  History of Present Illness: pt states few years of slight swelling at the anterior neck, and associated "pulling"-type pain.  Current Medications (verified): 1)  Omeprazole 20 Mg  Tbec (Omeprazole) .... Take 1 Tablet By Mouth Once A Day 2)  Lovastatin 40 Mg  Tabs (Lovastatin) .... 2 By Mouth Qd 3)  Fexofenadine Hcl 180 Mg Tabs (Fexofenadine Hcl) .Marland Kitchen.. 1 Once Daily 4)  Hydrochlorothiazide 25 Mg Tabs (Hydrochlorothiazide) .Marland Kitchen.. 1po Qd 5)  Naproxen 500 Mg Tabs (Naproxen) .... Use As Needed  Two Times A Day As Needed 6)  Ecotrin 325 Mg  Tbec (Aspirin) .Marland Kitchen.. 1 By Mouth Qd 7)  Evista 60 Mg  Tabs (Raloxifene Hcl) .Marland Kitchen.. 1 By Mouth Qd 8)  Norvasc 5 Mg  Tabs (Amlodipine Besylate) .Marland Kitchen.. 1 By Mouth Once Daily 9)  Metformin Hcl 500 Mg  Tabs (Metformin Hcl) .... 2 By Mouth Once Daily 10)  Toprol Xl 50 Mg  Tb24 (Metoprolol Succinate) .Marland Kitchen.. 1 1/2 Tab Once Daily 11)  Mucinex Maximum Strength 1200 Mg  Tb12 (Guaifenesin) .... 2 Tabs By Mouth Prn 12)  Freestyle Lite Test  Strp (Glucose Blood) .... Administer 1 Strip Once Daily As Directed 13)  Freestyle Lancets  Misc (Lancets) .... Use As Directed 14)  Fluticasone Propionate 50 Mcg/act Susp (Fluticasone Propionate) .... 2 Puffs Two Times A  Day 15)  Triamcinolone Acetonide 0.1 % Crea (Triamcinolone Acetonide) .... Use Asd Two Times A Day As Needed 16)  Clobetasol Propionate 0.05 % Oint (Clobetasol Propionate) .... Use Twice Once Daily To Hand and Feet As Needed 17)  Desonide 0.05 % Lotn (Desonide) .... Use Under Breast Two Times A Day As Needed 18)  Ketoconazole 2 % Crea (Ketoconazole) .... Use Asd Two Times A Day As Needed Under Breast 19)  Fluocinolone Acetonide 0.025 % Oint (Fluocinolone Acetonide) .... Use Asd Two Times A Day As Needed 20)  Advair Diskus 250-50 Mcg/dose Aepb (Fluticasone-Salmeterol) .Marland Kitchen.. 1 Puff Two Times A Day  Allergies (verified): 1)  ! Zocor  Past History:  Past Medical History: Last updated: 04/12/2010 DIABETES MELLITUS (ICD-250.00) DEGENERATIVE JOINT DISEASE (ICD-715.90) Hx of ADENOCARCINOMA, COLON, HX OF (ICD-V10.05) ARTHRITIS (ICD-716.90) OBESITY (ICD-278.00) PALPITATIONS (ICD-785.1) GLUCOSE INTOLERANCE (ICD-271.3) HYPERCHOLESTEROLEMIA (ICD-272.0) HYPERTENSION (ICD-401.9) CORONARY ARTERY DISEASE (ICD-414.00) GERD (ICD-530.81) COPD (ICD-496) chronic asthmatic bronchitis ASTHMA (ICD-493.90) ALLERGIC RHINITIS (ICD-477.9) Osteopenia hx of intestinal adhesions LVH psoriasis/eczema goiter  Family History: Reviewed history from 04/07/2008 and no changes required. sister and brother with overactive thyroid aunt with colon cancer father with liver disease  Social History: Reviewed history from 04/07/2008 and no changes  required. Married 4 children - 1 died with meningitis work - stay at home Former Smoker Alcohol use-no  Review of Systems       The patient complains of headaches.         denies hoarseness, double vision, palpitations, diarrhea, polyuria, numbness, anxiety, easy bruising, and rhinorrhea.  she has lost a few lbs, due to her efforts. she has intermittent tremor.  she attributes doe to asthma.  she has few arthralgias, menopausal sxs, and night sweats.     Physical  Exam  General:  normal appearance.   Head:  head: no deformity eyes: no periorbital swelling, no proptosis external nose and ears are normal mouth: no lesion seen Neck:  small multinodular goiter Lungs:  Clear to auscultation bilaterally. Normal respiratory effort.  Heart:  Regular rate and rhythm without murmurs or gallops noted. Normal S1,S2.   Msk:  muscle bulk and strength are grossly normal.  no obvious joint swelling.  gait is normal and steady  Extremities:  no deformity no edema Neurologic:  cn 2-12 grossly intact.   readily moves all 4's.   sensation is intact to touch on the feet  Skin:  normal texture and temp.  there is a psoriatric rash on the feet.  not diaphoretic  Cervical Nodes:  No significant adenopathy.  Psych:  Alert and cooperative; normal mood and affect; normal attention span and concentration.   Additional Exam:  test results are reviewed:  THYROID ULTRASOUND: Multinodular goiter.   tsh=0.66     Impression & Recommendations:  Problem # 1:  GOITER, MULTINODULAR (ICD-241.1) Assessment New usually hereditary  Problem # 2:  doe nor thyroid-related  Problem # 3:  OSTEOPENIA (ICD-733.90) not thyroid-related  Other Orders: Consultation Level IV (16109)  Patient Instructions: 1)  most of the time, a "lumpy thyroid" will eventually become overactive.  this is usually a slow process, happening over the span of many years. 2)  return april, 2012. Prescriptions: NORVASC 5 MG  TABS (AMLODIPINE BESYLATE) 1 by mouth once daily Brand medically necessary #90 x 3   Entered and Authorized by:   Minus Breeding MD   Signed by:   Minus Breeding MD on 05/21/2010   Method used:   Print then Give to Patient   RxID:   [id number]

## 2011-01-13 NOTE — Assessment & Plan Note (Signed)
Summary: NP follow up - asthma   Primary Provider/Referring Provider:  Oliver Barre  CC:  6 month follow up asthma - states breathing is doing well.  no new complaints..  History of Present Illness: 65 yo AAF, ex smoker with  asthmatic bronchitis, GERD and a component of VCD.   Worse during spring, fall & extreme heat. On xopenex MDI due to after taste & palpitations with albuterol   9/09--required 2 rounds of Abx & prednisone to resolve congestion  4/10 & 11/10-required prednisone dosepak  June 25, 2010 10:14 AM  Denies chest pain, dyspnea, orthopnea, hemoptysis, fever, n/v/d, edema, headache,recent travel or antibiotics, nocturnal awakening. Rare use of albuterol inhaler. Takes advair as needed but is resistant to decreasing dose to 100/50. No heartburn  December 21, 2010 --Presents for follow up. Uses Advair most days. Rare rescus use. She has been doing well. No flares. Denies chest pain, dyspnea, orthopnea, hemoptysis, fever, n/v/d, edema, headache, nocturnal awakenings.     Medications Prior to Update: 1)  Advair Diskus 250-50 Mcg/dose Aepb (Fluticasone-Salmeterol) .Marland Kitchen.. 1 Puff Two Times A Day 2)  Omeprazole 20 Mg  Tbec (Omeprazole) .... Take 1 Tablet By Mouth Once A Day 3)  Lovastatin 40 Mg  Tabs (Lovastatin) .... 2 By Mouth Qd 4)  Hydrochlorothiazide 25 Mg Tabs (Hydrochlorothiazide) .Marland Kitchen.. 1po Qd 5)  Naproxen 500 Mg Tabs (Naproxen) .... Use As Needed  Two Times A Day As Needed 6)  Ecotrin 325 Mg  Tbec (Aspirin) .Marland Kitchen.. 1 By Mouth Qd 7)  Norvasc 5 Mg  Tabs (Amlodipine Besylate) .Marland Kitchen.. 1 By Mouth Once Daily 8)  Toprol Xl 50 Mg  Tb24 (Metoprolol Succinate) .Marland Kitchen.. 1 1/2 Tab Once Daily 9)  Metformin Hcl 500 Mg  Tabs (Metformin Hcl) .... 2 By Mouth Once Daily 10)  Freestyle Lite Test  Strp (Glucose Blood) .... Administer 1 Strip Once Daily As Directed 11)  Freestyle Lancets  Misc (Lancets) .... Use As Directed 12)  Fluticasone Propionate 50 Mcg/act Susp (Fluticasone Propionate) .... 2 Puffs Two  Times A Day As Needed 13)  Triamcinolone Acetonide 0.1 % Crea (Triamcinolone Acetonide) .... Use Asd Two Times A Day As Needed 14)  Clobetasol Propionate 0.05 % Oint (Clobetasol Propionate) .... Use Twice Once Daily To Hand and Feet As Needed 15)  Desonide 0.05 % Lotn (Desonide) .... Use Under Breast Two Times A Day As Needed 16)  Ketoconazole 2 % Crea (Ketoconazole) .... Use Asd Two Times A Day As Needed Under Breast 17)  Fluocinolone Acetonide 0.025 % Oint (Fluocinolone Acetonide) .... Use Asd Two Times A Day As Needed 18)  Allegra Otc .... Prn 19)  Zovirax 5 % Crea (Acyclovir) .... Apply To Affected Area Every 3 Hours As Needed 20)  Levofloxacin 500 Mg Tabs (Levofloxacin) .Marland Kitchen.. 1po Once Daily 21)  Hydrocodone-Homatropine 5-1.5 Mg/79ml Syrp (Hydrocodone-Homatropine) .Marland Kitchen.. 1 Tsp By Mouth Q 6 Hrs  As Needed Cough 22)  Prednisone 10 Mg Tabs (Prednisone) .... 3po Qd For 3days, Then 2po Qd For 3days, Then 1po Qd For 3days, Then Stop 23)  Metoprolol Tartrate 25 Mg Tabs (Metoprolol Tartrate) .Marland Kitchen.. 1 and 1/2 By Mouth Two Times A Day  Current Medications (verified): 1)  Omeprazole 20 Mg  Tbec (Omeprazole) .... Take 1 Tablet By Mouth Once A Day 2)  Lovastatin 40 Mg  Tabs (Lovastatin) .... 2 By Mouth Qd 3)  Hydrochlorothiazide 25 Mg Tabs (Hydrochlorothiazide) .Marland Kitchen.. 1po Qd 4)  Naproxen 500 Mg Tabs (Naproxen) .... Use As Needed  Two Times A Day  As Needed 5)  Ecotrin 325 Mg  Tbec (Aspirin) .Marland Kitchen.. 1 By Mouth Qd 6)  Norvasc 5 Mg  Tabs (Amlodipine Besylate) .Marland Kitchen.. 1 By Mouth Once Daily 7)  Metformin Hcl 500 Mg  Tabs (Metformin Hcl) .... 2 By Mouth Once Daily 8)  Freestyle Lite Test  Strp (Glucose Blood) .... Administer 1 Strip Once Daily As Directed 9)  Freestyle Lancets  Misc (Lancets) .... Use As Directed 10)  Fluticasone Propionate 50 Mcg/act Susp (Fluticasone Propionate) .... 2 Puffs Two Times A Day As Needed 11)  Triamcinolone Acetonide 0.1 % Crea (Triamcinolone Acetonide) .... Use Asd Two Times A Day As  Needed 12)  Clobetasol Propionate 0.05 % Oint (Clobetasol Propionate) .... Use Twice Once Daily To Hand and Feet As Needed 13)  Desonide 0.05 % Lotn (Desonide) .... Use Under Breast Two Times A Day As Needed 14)  Ketoconazole 2 % Crea (Ketoconazole) .... Use Asd Two Times A Day As Needed Under Breast 15)  Fluocinolone Acetonide 0.025 % Oint (Fluocinolone Acetonide) .... Use Asd Two Times A Day As Needed 16)  Advair Diskus 250-50 Mcg/dose Aepb (Fluticasone-Salmeterol) .Marland Kitchen.. 1 Puff Two Times A Day 17)  Allegra Allergy 180 Mg Tabs (Fexofenadine Hcl) .... Take 1 Tablet By Mouth Once A Day 18)  Zovirax 5 % Crea (Acyclovir) .... Apply To Affected Area Every 3 Hours As Needed 19)  Hydrocodone-Homatropine 5-1.5 Mg/72ml Syrp (Hydrocodone-Homatropine) .Marland Kitchen.. 1 Tsp By Mouth Q 6 Hrs  As Needed Cough 20)  Metoprolol Tartrate 25 Mg Tabs (Metoprolol Tartrate) .Marland Kitchen.. 1 and 1/2 By Mouth Two Times A Day  Allergies (verified): 1)  ! Zocor  Past History:  Past Medical History: Last updated: 04/12/2010 DIABETES MELLITUS (ICD-250.00) DEGENERATIVE JOINT DISEASE (ICD-715.90) Hx of ADENOCARCINOMA, COLON, HX OF (ICD-V10.05) ARTHRITIS (ICD-716.90) OBESITY (ICD-278.00) PALPITATIONS (ICD-785.1) GLUCOSE INTOLERANCE (ICD-271.3) HYPERCHOLESTEROLEMIA (ICD-272.0) HYPERTENSION (ICD-401.9) CORONARY ARTERY DISEASE (ICD-414.00) GERD (ICD-530.81) COPD (ICD-496) chronic asthmatic bronchitis ASTHMA (ICD-493.90) ALLERGIC RHINITIS (ICD-477.9) Osteopenia hx of intestinal adhesions LVH psoriasis/eczema goiter  Past Surgical History: Last updated: 01/30/2008 Hysterectomy Percutaneous transluminal coronary angioplasty bone density 03/30/05 hernia repair tubal ligation rt hemicolectomy  Family History: Last updated: 04/07/2008 sister and brother with overactive thyroid aunt with colon cancer father with liver disease  Social History: Last updated: 11-04-2010 Married 4 children - 1 died with meningitis work - stay  at home Former Smoker Alcohol use-no Drug use-no  Risk Factors: Smoking Status: quit (06/25/2010) Packs/Day: 1.0 (06/25/2010)  Review of Systems      See HPI  Vital Signs:  Patient profile:   65 year old female Height:      63 inches Weight:      158.13 pounds BMI:     28.11 O2 Sat:      99 % on Room air Temp:     98.5 degrees F oral Pulse rate:   78 / minute BP sitting:   120 / 68  (left arm) Cuff size:   regular  Vitals Entered By: Boone Master CNA/MA (December 21, 2010 10:15 AM)  O2 Flow:  Room air CC: 6 month follow up asthma - states breathing is doing well.  no new complaints.   Physical Exam  Additional Exam:  Gen. Pleasant, well-nourished, in no distress ENT - no lesions, no post nasal drip Neck: No JVD, no thyromegaly, no carotid bruits Lungs: no use of accessory muscles, no dullness to percussion, clear without rales or rhonchi  Cardiovascular: Rhythm regular, heart sounds  normal, no murmurs or gallops, no peripheral edema Musculoskeletal:  No deformities, no cyanosis or clubbing      Impression & Recommendations:  Problem # 1:  ASTHMA (ICD-493.90) Well compensated  has agreed to decreased to 100/50 of Advair.  Plan:  Decrease Advair 100/50 1 puff two times a day  Brush/rinse and gargle after use.  Pneumovax today follow up Dr. Vassie Loll in 6  months  Please contact office for sooner follow up if symptoms do not improve or worsen   Medications Added to Medication List This Visit: 1)  Advair Diskus 100-50 Mcg/dose Aepb (Fluticasone-salmeterol) .Marland Kitchen.. 1 puff two times a day 2)  Allegra Allergy 180 Mg Tabs (Fexofenadine hcl) .... Take 1 tablet by mouth once a day  Complete Medication List: 1)  Advair Diskus 100-50 Mcg/dose Aepb (Fluticasone-salmeterol) .Marland Kitchen.. 1 puff two times a day 2)  Omeprazole 20 Mg Tbec (Omeprazole) .... Take 1 tablet by mouth once a day 3)  Lovastatin 40 Mg Tabs (Lovastatin) .... 2 by mouth qd 4)  Hydrochlorothiazide 25 Mg Tabs  (Hydrochlorothiazide) .Marland Kitchen.. 1po qd 5)  Naproxen 500 Mg Tabs (Naproxen) .... Use as needed  two times a day as needed 6)  Ecotrin 325 Mg Tbec (Aspirin) .Marland Kitchen.. 1 by mouth qd 7)  Norvasc 5 Mg Tabs (Amlodipine besylate) .Marland Kitchen.. 1 by mouth once daily 8)  Metformin Hcl 500 Mg Tabs (Metformin hcl) .... 2 by mouth once daily 9)  Freestyle Lite Test Strp (Glucose blood) .... Administer 1 strip once daily as directed 10)  Freestyle Lancets Misc (Lancets) .... Use as directed 11)  Fluticasone Propionate 50 Mcg/act Susp (Fluticasone propionate) .... 2 puffs two times a day as needed 12)  Triamcinolone Acetonide 0.1 % Crea (Triamcinolone acetonide) .... Use asd two times a day as needed 13)  Clobetasol Propionate 0.05 % Oint (Clobetasol propionate) .... Use twice once daily to hand and feet as needed 14)  Desonide 0.05 % Lotn (Desonide) .... Use under breast two times a day as needed 15)  Ketoconazole 2 % Crea (Ketoconazole) .... Use asd two times a day as needed under breast 16)  Fluocinolone Acetonide 0.025 % Oint (Fluocinolone acetonide) .... Use asd two times a day as needed 17)  Allegra Allergy 180 Mg Tabs (Fexofenadine hcl) .... Take 1 tablet by mouth once a day 18)  Zovirax 5 % Crea (Acyclovir) .... Apply to affected area every 3 hours as needed 19)  Hydrocodone-homatropine 5-1.5 Mg/4ml Syrp (Hydrocodone-homatropine) .Marland Kitchen.. 1 tsp by mouth q 6 hrs  as needed cough 20)  Metoprolol Tartrate 25 Mg Tabs (Metoprolol tartrate) .Marland Kitchen.. 1 and 1/2 by mouth two times a day  Other Orders: Est. Patient Level III (19147)  Patient Instructions: 1)  Decrease Advair 100/50 1 puff two times a day  2)  Brush/rinse and gargle after use.  3)  Pneumovax today 4)  follow up Dr. Vassie Loll in 6  months  5)  Please contact office for sooner follow up if symptoms do not improve or worsen  Prescriptions: ADVAIR DISKUS 100-50 MCG/DOSE AEPB (FLUTICASONE-SALMETEROL) 1 puff two times a day  #1 x 5   Entered and Authorized by:   Rubye Oaks NP   Signed by:   Keziyah Kneale NP on 12/21/2010   Method used:   Electronically to        [facility]  [phone]* (retail)       [address]       [place], [place]  [address]  Ph: [phone] or [phone]       Fax: [phone]   RxID:   [id number]      Appended Document: pneumovax updated     Clinical Lists Changes  Orders: Added new Service order of Pneumococcal Vaccine (43329) - Signed Added new Service order of Admin 1st Vaccine (51884) - Signed Observations: Added new observation of PNEUMOVAXLOT: 1170AA (12/21/2010 10:46) Added new observation of PNEUMOVAXEXP: 04/20/2012 (12/21/2010 10:46) Added new observation of PNEUMOVAXBY: Mindy Silva (12/21/2010 10:46) Added new observation of PNEUMOVAXRTE: IM (12/21/2010 10:46) Added new observation of PNEUMOVAXDOS: 0.5 ml (12/21/2010 10:46) Added new observation of PNEUMOVAXMFR: Merck (12/21/2010 10:46) Added new observation of PNEUMOVAXSIT: left deltoid (12/21/2010 10:46) Added new observation of PNEUMOVAX: Pneumovax (12/21/2010 10:46)       Immunizations Administered:  Pneumonia Vaccine:    Vaccine Type: Pneumovax    Site: left deltoid    Mfr: Merck    Dose: 0.5 ml    Route: IM    Given by: Carver Fila    Exp. Date: 04/20/2012    Lot #: [id number]

## 2011-01-13 NOTE — Letter (Signed)
Summary: Case management/[employer]  Case management/[employer]   Imported By: Lester Sonora 07/22/2010 08:16:41  _____________________________________________________________________  External Attachment:    Type:   Image     Comment:   External Document

## 2011-02-03 ENCOUNTER — Telehealth: Payer: Self-pay | Admitting: Internal Medicine

## 2011-02-03 ENCOUNTER — Encounter: Payer: Self-pay | Admitting: Internal Medicine

## 2011-02-08 NOTE — Letter (Signed)
Summary: Generic Letter  [facility]  [address] [place], [place] [address]   Phone: [phone]  Fax: [phone]    02/03/2011  [facility] [address] [place], [place]  [address]  Dear Ms. Meriam Sprague,       This letter is forwarded to you at your request regarding  an excuse from [facility].   You have been followed for several  years at the [facility], with your last   visit approximately Nov 2011.  You have been followed for   several serious medical conditions, including mild memory   loss, and I agree that you should be excused from [facility]  due to this chronic permanent illness.           Sincerely,   Oliver Barre MD

## 2011-02-08 NOTE — Progress Notes (Signed)
Summary: Jury Duty  Phone Note Call from Patient Call back at Home Phone [phone]   Caller: Patient Summary of Call: Pt called stating she has been summons for Jury Duty 03/20 but does not think she can serve. Pt states that her memory is not what it used to be and she has DM. Pt is requesting letter from MD excusing her from [facility] Duty Initial call taken by: Margaret Pyle, CMA,  February 03, 2011 4:04 PM  Follow-up for Phone Call        ok  - will do letter Follow-up by: Corwin Levins MD,  February 03, 2011 6:19 PM  Additional Follow-up for Phone Call Additional follow up Details #1::        Pt informed, Letter in cabinet for pt pick up Additional Follow-up by: Margaret Pyle, CMA,  February 04, 2011 8:16 AM

## 2011-03-24 ENCOUNTER — Other Ambulatory Visit: Payer: Self-pay | Admitting: Internal Medicine

## 2011-03-29 ENCOUNTER — Encounter: Payer: Self-pay | Admitting: Endocrinology

## 2011-03-29 ENCOUNTER — Ambulatory Visit (INDEPENDENT_AMBULATORY_CARE_PROVIDER_SITE_OTHER): Payer: 59 | Admitting: Endocrinology

## 2011-03-29 VITALS — BP 110/70 | HR 65 | Temp 98.3°F | Ht 63.0 in | Wt 156.1 lb

## 2011-03-29 DIAGNOSIS — E042 Nontoxic multinodular goiter: Secondary | ICD-10-CM

## 2011-03-29 NOTE — Patient Instructions (Addendum)
Recheck thyroid ultrasound. you will be called with a day and time for an appointment. Your thyroid blood test will be rechecked with your blood tests with dr Jonny Ruiz soon. most of the time, a "lumpy thyroid" will eventually become overactive.  this is usually a slow process, happening over the span of many years. Please return in 1 year (update: i left message on phone-tree:  rx as we discussed)

## 2011-03-29 NOTE — Progress Notes (Signed)
  Subjective:    Patient ID: [name] [name], female    DOB: 07-31-1946, 66 y.o.   MRN: [id number]  HPI Pt returns for f/u of a small multinodular goiter.  She says she does not notice the goiter.  She says she has intermittent "pulling" at the anterior neck, with singing.   Past Medical History  Diagnosis Date  . Psoriasis   . Eczema   . DIABETES MELLITUS 01/30/2008  . GOITER, MULTINODULAR 05/21/2010  . GLUCOSE INTOLERANCE 09/03/2007  . HYPERCHOLESTEROLEMIA 09/03/2007  . HYPERTENSION 09/03/2007  . CORONARY ARTERY DISEASE 09/03/2007  . ASTHMA 09/03/2007  . COPD 09/03/2007  . GERD 09/03/2007  . OBESITY 09/03/2007  . DEGENERATIVE JOINT DISEASE 01/30/2008  . ARTHRITIS 01/30/2008  . COLON CANCER 04/07/2008  . LVH (left ventricular hypertrophy)   . Osteopenia    Past Surgical History  Procedure Date  . Abdominal hysterectomy   . Ptca   . Hernia repair   . Tubal ligation   . Hemicolectomy     rt    reports that she has quit smoking. She does not have any smokeless tobacco history on file. She reports that she does not drink alcohol or use illicit drugs. family history includes Cancer in her other; Colon cancer in her other; Liver disease in her father; and Thyroid disease in her brother and sister. Allergies  Allergen Reactions  . Simvastatin      Review of Systems She has lost a few lbs, due to her efforts.    Objective:   Physical Exam GENERAL: no distress Neck: i do not appreciate a nodule in the thyroid or elsewhere in the neck     Lab Results  Component Value Date   TSH 0.69 04/06/2010      Assessment & Plan:  Multinodular goiter.   euthyroid

## 2011-03-31 ENCOUNTER — Other Ambulatory Visit: Payer: 59

## 2011-04-10 ENCOUNTER — Encounter: Payer: Self-pay | Admitting: Internal Medicine

## 2011-04-10 DIAGNOSIS — R7302 Impaired glucose tolerance (oral): Secondary | ICD-10-CM

## 2011-04-10 DIAGNOSIS — Z0000 Encounter for general adult medical examination without abnormal findings: Secondary | ICD-10-CM | POA: Insufficient documentation

## 2011-04-10 DIAGNOSIS — Z0001 Encounter for general adult medical examination with abnormal findings: Secondary | ICD-10-CM | POA: Insufficient documentation

## 2011-04-10 HISTORY — DX: Impaired glucose tolerance (oral): R73.02

## 2011-04-11 ENCOUNTER — Other Ambulatory Visit: Payer: Self-pay | Admitting: Internal Medicine

## 2011-04-11 ENCOUNTER — Other Ambulatory Visit (INDEPENDENT_AMBULATORY_CARE_PROVIDER_SITE_OTHER): Payer: Medicare Other

## 2011-04-11 ENCOUNTER — Ambulatory Visit
Admission: RE | Admit: 2011-04-11 | Discharge: 2011-04-11 | Disposition: A | Discharge status: Home / Self Care | Payer: Medicare Other | Source: Ambulatory Visit | Attending: Endocrinology | Admitting: Endocrinology

## 2011-04-11 DIAGNOSIS — E119 Type 2 diabetes mellitus without complications: Secondary | ICD-10-CM

## 2011-04-11 DIAGNOSIS — Z0000 Encounter for general adult medical examination without abnormal findings: Secondary | ICD-10-CM

## 2011-04-11 DIAGNOSIS — IMO0001 Reserved for inherently not codable concepts without codable children: Secondary | ICD-10-CM

## 2011-04-11 DIAGNOSIS — E042 Nontoxic multinodular goiter: Secondary | ICD-10-CM

## 2011-04-11 LAB — CBC WITH DIFFERENTIAL/PLATELET
Basophils Absolute: 0 10*3/uL (ref 0.0–0.1)
Eosinophils Absolute: 0.3 10*3/uL (ref 0.0–0.7)
Hemoglobin: 12.8 g/dL (ref 12.0–15.0)
Lymphocytes Relative: 31.8 % (ref 12.0–46.0)
MCHC: 33 g/dL (ref 30.0–36.0)
MCV: 83.9 fl (ref 78.0–100.0)
Monocytes Absolute: 0.4 10*3/uL (ref 0.1–1.0)
Neutro Abs: 4.3 10*3/uL (ref 1.4–7.7)
RDW: 16.2 % — ABNORMAL HIGH (ref 11.5–14.6)

## 2011-04-11 LAB — HEPATIC FUNCTION PANEL
ALT: 14 U/L (ref 0–35)
AST: 16 U/L (ref 0–37)
Albumin: 3.7 g/dL (ref 3.5–5.2)
Alkaline Phosphatase: 51 U/L (ref 39–117)
Bilirubin, Direct: 0.1 mg/dL (ref 0.0–0.3)
Total Bilirubin: 0.6 mg/dL (ref 0.3–1.2)
Total Protein: 6.1 g/dL (ref 6.0–8.3)

## 2011-04-11 LAB — BASIC METABOLIC PANEL
BUN: 15 mg/dL (ref 6–23)
CO2: 31 mEq/L (ref 19–32)
Calcium: 9.1 mg/dL (ref 8.4–10.5)
Chloride: 101 mEq/L (ref 96–112)
Creatinine, Ser: 0.7 mg/dL (ref 0.4–1.2)
GFR: 117.43 mL/min (ref >60.00)
Glucose, Bld: 86 mg/dL (ref 70–99)
Potassium: 4.6 mEq/L (ref 3.5–5.1)
Sodium: 139 mEq/L (ref 135–145)

## 2011-04-11 LAB — URINALYSIS
Bilirubin Urine: NEGATIVE
Ketones, ur: NEGATIVE
Leukocytes, UA: NEGATIVE
Nitrite: NEGATIVE
Specific Gravity, Urine: 1.025 (ref 1.000–1.030)
Total Protein, Urine: NEGATIVE
Urine Glucose: NEGATIVE
Urobilinogen, UA: 0.2 (ref 0.0–1.0)
pH: 6 (ref 5.0–8.0)

## 2011-04-11 LAB — LIPID PANEL
HDL: 48.1 mg/dL (ref >39.00)
Total CHOL/HDL Ratio: 3
VLDL: 17.2 mg/dL (ref 0.0–40.0)

## 2011-04-11 LAB — HEMOGLOBIN A1C: Hgb A1c MFr Bld: 6.5 % (ref 4.6–6.5)

## 2011-04-14 ENCOUNTER — Ambulatory Visit: Payer: Self-pay | Admitting: Internal Medicine

## 2011-04-15 ENCOUNTER — Encounter: Payer: Self-pay | Admitting: Internal Medicine

## 2011-04-15 ENCOUNTER — Ambulatory Visit (INDEPENDENT_AMBULATORY_CARE_PROVIDER_SITE_OTHER): Payer: Medicare Other | Admitting: Internal Medicine

## 2011-04-15 VITALS — BP 110/70 | HR 70 | Temp 98.2°F | Ht 63.0 in | Wt 158.0 lb

## 2011-04-15 DIAGNOSIS — E119 Type 2 diabetes mellitus without complications: Secondary | ICD-10-CM

## 2011-04-15 DIAGNOSIS — Z0000 Encounter for general adult medical examination without abnormal findings: Secondary | ICD-10-CM

## 2011-04-15 DIAGNOSIS — R7302 Impaired glucose tolerance (oral): Secondary | ICD-10-CM

## 2011-04-15 DIAGNOSIS — R7309 Other abnormal glucose: Secondary | ICD-10-CM

## 2011-04-15 MED ORDER — ACYCLOVIR 5 % EX CREA: 1.0000 "application " | TOPICAL_CREAM | CUTANEOUS | Status: DC

## 2011-04-15 NOTE — Assessment & Plan Note (Signed)

## 2011-04-15 NOTE — Patient Instructions (Signed)
Continue all other medications as before You are otherwise up to date on prevention today Please return in 6 mo with Lab testing done 3-5 days before

## 2011-04-15 NOTE — Assessment & Plan Note (Signed)
stable overall by hx and exam, most recent lab reviewed with pt, and pt to continue medical treatment as before  Lab Results  Component Value Date   HGBA1C 6.5 04/11/2011    

## 2011-04-17 ENCOUNTER — Encounter: Payer: Self-pay | Admitting: Internal Medicine

## 2011-04-17 NOTE — Assessment & Plan Note (Signed)
stable overall by hx and exam, most recent lab reviewed with pt, and pt to continue medical treatment as before  Lab Results  Component Value Date   HGBA1C 6.5 04/11/2011

## 2011-04-17 NOTE — Progress Notes (Signed)
Subjective:    Patient ID: [name] [name], female    DOB: 09-25-1946, 65 y.o.   MRN: [id number]  HPI Here for wellness and f/u;  Overall doing ok;  Pt denies CP, worsening SOB, DOE, wheezing, orthopnea, PND, worsening LE edema, palpitations, dizziness or syncope.  Pt denies neurological change such as new Headache, facial or extremity weakness.  Pt denies polydipsia, polyuria, or low sugar symptoms. Pt states overall good compliance with treatment and medications, good tolerability, and trying to follow lower cholesterol diet.  Pt denies worsening depressive symptoms, suicidal ideation or panic. No fever, wt loss, night sweats, loss of appetite, or other constitutional symptoms.  Pt states good ability with ADL's, low fall risk, home safety reviewed and adequate, no significant changes in hearing or vision, and occasionally active with exercise.  No other specific complaints Past Medical History  Diagnosis Date  . Psoriasis   . Eczema   . DIABETES MELLITUS 01/30/2008  . GOITER, MULTINODULAR 05/21/2010  . GLUCOSE INTOLERANCE 09/03/2007  . HYPERCHOLESTEROLEMIA 09/03/2007  . HYPERTENSION 09/03/2007  . CORONARY ARTERY DISEASE 09/03/2007  . ASTHMA 09/03/2007  . COPD 09/03/2007  . GERD 09/03/2007  . OBESITY 09/03/2007  . DEGENERATIVE JOINT DISEASE 01/30/2008  . ARTHRITIS 01/30/2008  . COLON CANCER 04/07/2008  . LVH (left ventricular hypertrophy)   . Osteopenia   . Impaired glucose tolerance 04/10/2011   Past Surgical History  Procedure Date  . Abdominal hysterectomy   . Ptca   . Hernia repair   . Tubal ligation   . Hemicolectomy     rt    reports that she has quit smoking. She does not have any smokeless tobacco history on file. She reports that she does not drink alcohol or use illicit drugs. family history includes Cancer in her other; Colon cancer in her other; Liver disease in her father; and Thyroid disease in her brother and sister. Allergies  Allergen Reactions  . Simvastatin    Current  Outpatient Prescriptions on File Prior to Visit  Medication Sig Dispense Refill  . amLODipine (NORVASC) 5 MG tablet Take 5 mg by mouth daily.        Marland Kitchen aspirin 325 MG EC tablet Take 325 mg by mouth daily.        . clobetasol (TEMOVATE) 0.05 % ointment Apply topically. Use twice once daily to hand and feet as needed       . desonide (DESOWEN) 0.05 % lotion Apply topically. Use under breast two times a day as needed       . fexofenadine (ALLEGRA) 180 MG tablet Take 180 mg by mouth daily.        . fluocinolone (SYNALAR) 0.025 % ointment Apply topically 2 (two) times daily as needed.        . fluticasone (FLONASE) 50 MCG/ACT nasal spray 2 sprays by Nasal route daily.        . Fluticasone-Salmeterol (ADVAIR DISKUS) 100-50 MCG/DOSE AEPB Inhale 1 puff into the lungs 2 (two) times daily.        Marland Kitchen FREESTYLE LITE test strip TEST ONCE DAILY AS DIRECTED  100 each  9  . hydrochlorothiazide 25 MG tablet Take 25 mg by mouth daily.        Marland Kitchen ketoconazole (NIZORAL) 2 % cream Apply topically. use as directed two times a day as needed under breast       . Lancets (FREESTYLE) lancets 1 each by Other route. Use as directed       . lovastatin (MEVACOR)  40 MG tablet Take 40 mg by mouth 2 (two) times daily.        . metFORMIN (GLUCOPHAGE) 500 MG tablet Take 500 mg by mouth 2 (two) times daily.        . metoprolol tartrate (LOPRESSOR) 25 MG tablet Take 25 mg by mouth. 1 and 1/2 by mouth two times a day       . naproxen (NAPROSYN) 500 MG tablet Take 500 mg by mouth 2 (two) times daily as needed.        Marland Kitchen omeprazole (PRILOSEC) 20 MG capsule Take 20 mg by mouth daily.        Marland Kitchen triamcinolone (KENALOG) 0.1 % cream Apply topically 2 (two) times daily as needed.         Review of Systems Review of Systems  Constitutional: Negative for diaphoresis, activity change, appetite change and unexpected weight change.  HENT: Negative for hearing loss, ear pain, facial swelling, mouth sores and neck stiffness.   Eyes: Negative for  pain, redness and visual disturbance.  Respiratory: Negative for shortness of breath and wheezing.   Cardiovascular: Negative for chest pain and palpitations.  Gastrointestinal: Negative for diarrhea, blood in stool, abdominal distention and rectal pain.  Genitourinary: Negative for hematuria, flank pain and decreased urine volume.  Musculoskeletal: Negative for myalgias and joint swelling.  Skin: Negative for color change and wound.  Neurological: Negative for syncope and numbness.  Hematological: Negative for adenopathy.  Psychiatric/Behavioral: Negative for hallucinations, self-injury, decreased concentration and agitation.      Objective:   Physical Exam BP 110/70  Pulse 70  Temp(Src) 98.2 F (36.8 C) (Oral)  Ht 5\' 3"  (1.6 m)  Wt 158 lb (71.668 kg)  BMI 27.99 kg/m2  SpO2 97% Physical Exam  VS noted Constitutional: Pt is oriented to person, place, and time. Appears well-developed and well-nourished.  HENT:  Head: Normocephalic and atraumatic.  Right Ear: External ear normal.  Left Ear: External ear normal.  Nose: Nose normal.  Mouth/Throat: Oropharynx is clear and moist.  Eyes: Conjunctivae and EOM are normal. Pupils are equal, round, and reactive to light.  Neck: Normal range of motion. Neck supple. No JVD present. No tracheal deviation present.  Cardiovascular: Normal rate, regular rhythm, normal heart sounds and intact distal pulses.   Pulmonary/Chest: Effort normal and breath sounds normal.  Abdominal: Soft. Bowel sounds are normal. There is no tenderness.  Musculoskeletal: Normal range of motion. Exhibits no edema.  Lymphadenopathy:  Has no cervical adenopathy.  Neurological: Pt is alert and oriented to person, place, and time. Pt has normal reflexes. No cranial nerve deficit.  Skin: Skin is warm and dry. No rash noted.  Psychiatric:  Has  normal mood and affect. Behavior is normal.        Assessment & Plan:

## 2011-04-26 NOTE — Assessment & Plan Note (Signed)
[facility]                             PULMONARY OFFICE NOTE   NAME:THIGPENEmer, Onnen                       MRN:          [id number]  DATE:10/29/2007                            DOB:          1946-03-13    Ms. Ord is a 65 year old African-American woman with chronic  asthmatic bronchitis, maintained on a regimen of Advair 500/50 b.i.d.  and Xopenex MDI p.r.n. for breakthrough symptoms.  GERD has thought to  be a contributing factor.  She was recently switched from Aciphex to  generic omeprazole.  For the most part, this has worked well.  However,  occasionally she does feel like she needs the Aciphex for better control  of her heartburn.  She admits to decreased compliance with her Advair,  as this is a very expensive medication.  Colonoscopy and other GI tests  have been planned by her gastroenterologist.   PHYSICAL EXAMINATION:  Weight 181 pounds.  Temperature 98.4, blood  pressure 106/74, heart rate 73 per minute, oxygen saturation 99% on room  air.  CV:  S1 and S2 normal.  CHEST:  Clear to auscultation.  ABDOMEN:  Soft and nontender.   IMPRESSION:  1. Chronic asthmatic bronchitis:  She will continue Advair 500/50      b.i.d.  Samples were provided.  Xopenex will be used on a p.r.n.      basis.  2. Gastroesophageal reflux disease:  She will continue on generic      omeprazole for the most part and use Aciphex only if her symptoms      are uncontrolled on this.  She will return in three months to see a      nurse practitioner, Rubye Oaks, for review of her medications      and see me again in about six months' time.  3. A flu shot has been administered in October, 2008.  She does not      desire Pneumovax at the current time but is willing to take this      next year.     Oretha Milch, MD  Electronically Signed    R[facility]/Med[facility]  DD: 10/29/2007  DT: 10/30/2007  Job #: [id number]   cc:   Corwin Levins, MD

## 2011-04-26 NOTE — Assessment & Plan Note (Signed)
[facility]                             PULMONARY OFFICE NOTE   NAME:[name] [name], [name] [name]                       MRN:          [id number]  DATE:07/20/2007                            DOB:          1946/01/06    [name] [name] is a 65 year old African American woman who has been seeing  Dr. Jayme Cloud for the past 7 years for asthmatic bronchitis.  She smoked  about a pack per day for 30 years and quit 10 years ago.  She has a  flare up of her symptoms 2-3 times a year related to seasonal allergies  or a chest cold and seems to respond well to steroids and  bronchodilators acutely.  She is maintained on a regimen of Advair and  Xopenex MDI for rescue and seems to be doing well without any nocturnal  symptoms.  Her chest x-ray from March 2008, did not show any infiltrates  or effusions.  I note spirometry from December 2005, showing small  airway disease and then in January 2006, suggesting moderate obstruction  with an FEV1 of 57%.   CURRENT MEDICATIONS:  1. HCTZ 25 mg daily.  2. Aspirin 325 mg daily.  3. Evista 60 mg daily.  4. Toprol XL 75 mg daily.  5. Norvasc 5 mg daily.  6. Aciphex 20 mg daily.  7. KCl 10 mEq daily.  8. Advair 500/50 one puff b.i.d.  9. Lovastatin 40 mg two tablets daily.  10.Metformin 500 mg daily.  11.Clarinex p.r.n.  12.Xopenex MDI 2 puffs p.r.n.   PHYSICAL EXAMINATION:  VITAL SIGNS:  Weight 183 pounds, blood pressure  110/70, heart rate 72, oxygen saturation 98% on room air.  HEENT:  No oral thrush.  No postnasal drip.  NECK:  Supple.  No JVD.  No lymphadenopathy.  CARDIOVASCULAR:  S1 S2 normal.  No murmur.  CHEST:  Clear to auscultation.   IMPRESSION:  Chronic asthmatic bronchitis in this ex-smoker.   RECOMMENDATIONS:  1. Continue Advair 500/50 b.i.d. and use Xopenex MDI only for      breakthrough symptoms.  2. We discussed a plan in case she catches a chest cold or has      worsening bronchospasm.  3. Spirometry will be  performed on a followup visit in 3 months.     Oretha Milch, MD  Electronically Signed    RVA/MedQ  DD: 07/20/2007  DT: 07/20/2007  Job #: [id number]   cc:   Corwin Levins, MD

## 2011-04-26 NOTE — Assessment & Plan Note (Signed)
[facility]                         GASTROENTEROLOGY OFFICE NOTE   NAME:[name], [name] PELLECCHIA                       MRN:          [id number]  DATE:10/25/2007                            DOB:          [birth year]    GASTROENTEROLOGY CONSULTATION   REASON FOR EVALUATION:  Abdominal pain and surveillance colonoscopy.   HISTORY:  This is a 65 year old African-American female with a history  of hypertension, asthma, dyslipidemia, arthritis, obesity and colon  cancer for which she underwent right hemicolectomy in 1987.  The patient  has also had multiple other surgeries including hysterectomy, tubal  ligation and hernia repair.  Regarding surveillance colonoscopy, her  initial exam's were performed in [facility].  She subsequently  transferred her care to Dr. Victorino Dike.  Colonoscopy in 1996 was  essentially normal to the level of the anastomosis.  She did have mild  diverticulosis.  Repeat colonoscopy in 2001 was incomplete due to fixed  left colon.  The patient had a barium enema to complete the exam, which  was negative.  She had a great deal of discomfort during that exam and  is distressed about having a repeat exam.  She was not interested in  having colonoscopy due to pain at the time of her last exam except that  in September she was having some problems with epigastric and right  upper quadrant pain.  She was having this on a daily basis for about  three to four weeks.  It was associated with nausea.  The pain was  described as sharp but only lasted a few minutes and then resolved.  There were no obvious exacerbating factors such as food, movement or  stress.  There were no other accompanying symptoms such as vomiting or  shortness of breath.  No chest pain.  However, she has had no problems  over the past four weeks.   PAST MEDICAL HISTORY:  As above.   ALLERGIES:  ZOCOR.   CURRENT MEDICATIONS:  1. Hydrochlorothiazide 25 mg daily.  2. Aspirin 325  mg daily.  3. Evista 60 mg daily.  4. Toprol XL 75 mg daily.  5. Norvasc 5 mg daily.  6. GFN 1200 mg b.i.d.  7. Advair b.i.d.  8. Naproxen 500 mg daily.  9. Lovastatin 80 mg daily.  10.Metformin ER 500 mg daily.  11.Omeprazole 20 mg daily.  12.She also uses Clarinex p.r.n. and albuterol p.r.n.   FAMILY HISTORY:  Paternal aunt with colon cancer and colon polyps.  Father with unspecified liver disease.   SOCIAL HISTORY:  The patient is married with four children, one of whom  is deceased.  She lives with her husband.  She is a housewife.  She does  not smoke or use alcohol.   REVIEW OF SYSTEMS:  Per diagnostic evaluation form.   PHYSICAL EXAMINATION:  GENERAL APPEARANCE:  Well-appearing female in no  acute distress.  VITAL SIGNS:  Blood pressure 122/80.  Heart rate 70 and regular.  Weight  is 181.6 pounds.  She is 5 feet, 3 inches in height.  HEENT:  Sclerae anicteric.  Conjunctivae pink.  Oral mucosa  is intact.  NECK:  No adenopathy.  LUNGS:  Clear.  HEART:  Regular.  ABDOMEN:  Soft without tenderness, mass or hernia.  Good bowel sounds  heard.  Previous surgical incisions well healed.  EXTREMITIES:  Are without edema.   IMPRESSION:  1. Recent transient problems with epigastric and right upper quadrant      pain.  Now resolved.  Rule out biliary colic.  2. Personal history of colon cancer.  Overdue for surveillance.  Bad      experience at the time of her last colonoscopy as described above.   RECOMMENDATIONS:  1. Continue Prilosec.  2. Abdominal ultrasound to rule out gallstones.  3. I discussed with her several options for surveillance colonic      evaluation including air contrast barium enema, virtual      colonoscopy, and repeat colonoscopy.  I discussed each of these in      great detail.  I discussed the nature of these procedures or      techniques, as well as their risks, benefits and limitations.  She      understood the differences and issues.  In the endl,  she wished to      proceed with repeat colonoscopy understanding that we would do our      very best to keep her comfortable and complete the exam.  If not,      she may require an imaging study.  She understands the inherent      risks.  4. Ongoing general medical care with Dr. Jonny Ruiz.     Wilhemina Bonito. Marina Goodell, MD  Electronically Signed    JNP/MedQ  DD: 10/25/2007  DT: 10/26/2007  Job #: [id number]   cc:   Corwin Levins, MD

## 2011-04-29 NOTE — Cardiovascular Report (Signed)
NAME:  [name] [name], [name] [name]                          ACCOUNT NO.:  [id number]   MEDICAL RECORD NO.:  [id number]                   PATIENT TYPE:  OIB   LOCATION:  2864                                 FACILITY:  [facility]   PHYSICIAN:  Veneda Melter, M.D.                   DATE OF BIRTH:  11/15/1946   DATE OF PROCEDURE:  07/21/2003  DATE OF DISCHARGE:                              CARDIAC CATHETERIZATION   PROCEDURES PERFORMED:  1. Left heart catheterization.  2. Left ventriculogram.  3. Selective coronary angiography.  4. Perclose right femoral artery.   DIAGNOSES:  1. Mild coronary artery disease by angiogram.  2. Normal left ventricular systolic function.   HISTORY:  [name] [name] is a 64 year old black female with hypertension who  presents with dyspnea on exertion, fatigue and palpitations.  The patient  underwent stress echo suggesting ischemia in the inferior septum.  She  presents now for further assessment.   TECHNIQUE:  Informed consent was obtained.  The patient brought to the  catheterization lab.  A 6 French sheath was placed in the right femoral  artery using the modified Seldinger technique.  A 6 [place] JL-4 and JR-4  catheter was then used to engage the left and right coronary arteries and  selective angiography performed in various projections using manual  injection contrast.  A 6 French pigtail catheter was then advanced in the  left ventricle and a left ventriculogram performed using power injection  contrast.  After termination of this case, the catheters and sheath were  removed and Perclose suture closure device deployed to the right femoral  artery until adequate hemostasis was achieved.  The patient was also given 1  g of Ancef IV.  She tolerated the procedure and was transferred to the floor  in stable condition.   FINDINGS:   LEFT HEART CATHETERIZATION:  1. Left main trunk:  Large caliber vessel with mild irregularities.  2. LAD:  This is a large caliber vessel  that provides two diagonal branches     in the mid section.  The LAD has luminal irregularities.  3.  Left     circumflex artery:  This is a large caliber vessel that consists of a     large bifurcating marginal branch in the mid section.  Left circumflex     system has luminal irregularities.  3. Ramus intermedius is a small caliber vessel that bifurcates to the distal     section.  Provides anterior lateral wall.  There are mild irregularities     in the ramus branch.  4. Right coronary artery is dominant.  This is a medium-caliber vessel that     provides small posterior descending artery and posterior ventricular     branch of the terminal segment.  The right coronary artery has mild     disease of 15-20% in the proximal segment.  The posterior descending  artery and posterior ventricular branches are small caliber vessels that     truncate prior the distal septum.   LEFT VENTRICULOGRAPHY:  1. LV normal end-systolic and end-diastolic dimensions.  2. Overall left ventricular function is well preserved.  3. Ejection fraction greater than 65%.  4. No mitral regurgitation.  5. LV pressure is 140/10.  6. Aortic pressure is 140/70.  7. LVEDP equals 25.   ASSESSMENT AND PLAN:  Ms.  [name] is a 65 year old female with mild  coronary artery disease and well preserved LV function by angiogram.  Continued medical therapy will be recommended.                                                 Veneda Melter, M.D.    Melton Alar  D:  07/21/2003  T:  07/21/2003  Job:  [id number]   cc:   Corwin Levins, M.D. [facility]

## 2011-04-29 NOTE — Assessment & Plan Note (Signed)
[facility]                             PULMONARY OFFICE NOTE   NAME:[name] [name], [name] [name]                       MRN:          [id number]  DATE:02/21/2007                            DOB:          November 02, 1946    WORKING EVALUATION NOTE:  This is a 65 year old African-American female  whom I follow here for asthmatic bronchitis, gastroesophageal reflux  with oropharyngeal component of VCD.  The patient presents today with a  complaint of cough and chest congestion.  She initially started with  production of thick white sputum, now has turned yellowish-green.  She  also has had sensation of subjective fevers, no chills.  Duration has  been 2-3 days prior to this evaluation.   CURRENT MEDICATIONS:  As noted on the intake sheet.  These have been  reviewed and are accurate.   PHYSICAL EXAMINATION:  VITAL SIGNS:  As noted.  Oxygen saturation is 97%  on room air.  GENERAL:  This is a well-developed, somewhat obese female who is in no  acute distress.  HEENT:  Unremarkable.  NECK:  Supple.  No adenopathy noted.  No JVD.  LUNGS:  She has end-expiratory wheezes throughout.  CARDIAC:  Regular rate and rhythm.  No rubs, murmurs or gallops heard.  ABDOMEN:  Benign.  EXTREMITIES:  No cyanosis, no clubbing or edema noted.   We obtained chest x-ray today, which showed no infiltrate.  We also gave  the patient a nebulization with Xopenex 1.5.  This helped clear her  wheezes and improved air entry significantly.   IMPRESSION:  Acute exacerbation of asthmatic bronchitis due to acute  tracheobronchitis.   PLAN:  Give the patient Depo-Medrol 120 mg IM x1 for the inflammatory  component of her illness.  We will then treat her with Levaquin 750 mg  daily x5 days.  Samples were given to the patient.   FOLLOW-UP:  In 1-2 weeks' time with our nurse practitioner, Rubye Oaks, NP.  She is to contact us prior to that time should any  problems arise.     Gailen Shelter, MD  Electronically Signed    CLG/MedQ  DD: 02/22/2007  DT: 02/24/2007  Job #: [id number]

## 2011-04-29 NOTE — Assessment & Plan Note (Signed)
[facility]                             PULMONARY OFFICE NOTE   NAME:[name] [name], DEOLA REWIS                       MRN:          [id number]  DATE:03/07/2007                            DOB:          Feb 24, 1946    HISTORY OF PRESENT ILLNESS:  The patient is a 65 year old white female  patient of Dr. Georgann Housekeeper who has a known history of asthmatic  bronchitis complicated by gastroesophageal reflux and a component of  VCD.  Patient presents today for a 2-week followup of a recent asthmatic  bronchitic flare.  Patient was given Levaquin 750 mg for 5 days and a  Depo-Medrol injection.  Patient returns today reporting that she is much  improved, cough, congestion is resolved.  Patient denies any fever,  hemoptysis, orthopnea, PND or leg swelling.   PAST MEDICAL HISTORY:  Reviewed.   CURRENT MEDICATIONS:  Reviewed.   PHYSICAL EXAM:  The patient is a pleasant female in no acute distress,  she is afebrile with stable vital signs.  The O2 saturation is 99% on  room air.  HEENT:  Unremarkable.  NECK:  Supple without cervical adenopathy.  No JVD.  LUNG SOUNDS:  Diminished breath sounds in the bases without any  wheezing.  CARDIAC:  Regular rate.  ABDOMEN:  Soft and nontender.  EXTREMITIES:  Warm without any edema.   IMPRESSION AND PLAN:  Recent asthmatic bronchitic flare.  Now resolved.  Patient will continue on present regimen, follow back up with Dr.  Jayme Cloud in 6 weeks or sooner if needed.      Rubye Oaks, NP  Electronically Signed      Gailen Shelter, MD  Electronically Signed   TP/MedQ  DD: 03/09/2007  DT: 03/09/2007  Job #: [id number]

## 2011-04-29 NOTE — Assessment & Plan Note (Signed)
[facility]                             PULMONARY OFFICE NOTE   NAME:[name] [name], JUNI GLAAB                       MRN:          [id number]  DATE:11/15/2006                            DOB:          [birth year]    This is a very pleasant 65 year old African-American female who I follow  here for asthmatic bronchitis.   The patient presents today with no complaints whatsoever.  She states  that her breathing is the best she has ever done.  She actually has not  used any of her rescue albuterol inhaler for over the last 3 weeks.   The patient denies any fevers, chills or sweats.  She has had no cough,  no sputum production.   Current medications are as noted on the intake sheet.  These were  inventoried on the date of December 5; no discrepancies were noted.   PHYSICAL EXAMINATION:  VITAL SIGNS:  Noted.  Oxygen saturation was 98%  on room air.  GENERAL:  This is a well-developed, somewhat obese female who is in no  acute distress.  HEENT:  Unremarkable.  NECK:  Supple, no adenopathy noted, no JVD.  LUNGS:  Remarkably clear to auscultation.  No wheezes noted.  CARDIAC:  Regular rate and rhythm; no rubs, murmurs, or gallops heard.  EXTREMITIES:  The patient has no cyanosis, no clubbing, no edema noted.   IMPRESSION:  1. Asthmatic bronchitis.  The patient is well compensated on her      current regimen.  The plan will therefore be to continue her      current regimen of Advair 500/50 one inhalation twice a day.  2. Followup will be in 3 months' time.  She will contact us prior to      that time should she have any problems.     Gailen Shelter, MD  Electronically Signed    CLG/MedQ  DD: 11/15/2006  DT: 11/15/2006  Job #: [id number]

## 2011-04-29 NOTE — Assessment & Plan Note (Signed)
[facility]                               PULMONARY OFFICE NOTE   NAME:[name] [name], [name] [name]                       MRN:          [id number]  DATE:08/11/2006                            DOB:          December 15, 1945    This is a very pleasant 65 year old African-American female who follows here  for chronic obstructive pulmonary disease with asthmatic bronchitic  component.  The patient is actually fairly well compensated but recently has  noted some increased dyspnea over the last two to three days which she  attributes to environmental factors, mainly allergies.   CURRENT MEDICATIONS:  Are those noted on the intake sheet.  These have been  reviewed and are accurate.   PHYSICAL EXAMINATION:  VITAL SIGNS:  Noted.  Oxygen saturation is 96% on  room air.  GENERAL:  This is a well-developed, well-nourished African-American female  who is in no acute distress.  HEENT:  Examination is unremarkable.  NECK:  Supple.  No adenopathy noted.  No JVD.  LUNGS:  She has faint end-expiratory wheezes throughout but is moving air  fairly well.  CARDIAC:  Examination regular rate, rhythm.  No rubs, murmurs, gallops  heard.  EXTREMITIES:  No cyanosis, no clubbing, no edema noted.   Patient was given a Xopenex nebulization treatment.  This cleared her lungs  entirely.   IMPRESSION:  Mild exacerbation of asthmatic bronchitis due to environmental  factors.   PLAN:  1. I did recommend to the patient nasal hygiene and aggressive control of      her environmental allergy symptoms with use of  as needed Clarinex.      She usually does very well with this.  2. Given also avoidance measures.  3. Continue medications as they are.  4. Followup will be in 4-6 weeks' time.  She is to contact us prior to      that time should any new problems arise.                                   Gailen Shelter, MD   CLG/MedQ  DD:  08/14/2006  DT:  08/14/2006  Job #:  [id number]

## 2011-04-29 NOTE — Cardiovascular Report (Signed)
[place]  Patient:    [name] [name], COCKER                       MRN: [id number] Proc. Date: 02/13/01 Adm. Date:  [id number] Disc. Date: [id number] Attending:  Daisey Must CC:         Corwin Levins, M.D. [facility]  Veneda Melter, M.D. [facility]  Cardiac Catheterization Laboratory   Cardiac Catheterization  PROCEDURES PERFORMED:  Left heart catheterization with coronary angiography and left ventriculography.  INDICATIONS:  [name] is a 65 year old woman with multiple cardiovascular risk factors.  She has been experiencing exertional dyspnea and chest discomfort.  She was referred for cardiac catheterization to rule out coronary artery disease.  DESCRIPTION OF PROCEDURE:  A 6 French sheath was placed in the right femoral artery.  Standard Judkins 6 French catheters were utilized.  Contrast was Omnipaque.  There were no complications.  RESULTS:  HEMODYNAMICS:  Left ventricular pressure 146/20, aortic pressure 146/88. There was no aortic valve gradient.  LEFT VENTRICULOGRAM:  Wall motion is normal to hyperdynamic.  Ejection fraction estimated greater than 65%.  No mitral regurgitation.  CORONARY ARTERIOGRAPHY:  (Right dominant).  Left main:  Left main is normal.  Left anterior descending:  The left anterior descending artery has a U-shaped bend in the proximal vessel and in the apex of this bend is a 30% stenosis. Following this is a tubular 20% stenosis.  The LAD gives rise to two small diagonal branches.  Left circumflex:  The left circumflex gives rise to a small ramus intermediate, small OM-1 and a large branching OM-2.  The left circumflex is free of angiographic disease.  Right coronary artery:  The right coronary artery has a 20% stenosis proximally which may represent catheter-induced spasm.  The right coronary artery is otherwise normal.  It gives rise to a normal sized posterior descending and a small posterolateral  branch.  IMPRESSION: 1. Normal to hyperdynamic left ventricular systolic function. 2. Insignificant coronary artery disease. DD:  02/13/01 TD:  02/14/01 Job: [id number]

## 2011-05-25 ENCOUNTER — Other Ambulatory Visit: Payer: Self-pay | Admitting: Internal Medicine

## 2011-06-22 ENCOUNTER — Other Ambulatory Visit: Payer: Self-pay | Admitting: Internal Medicine

## 2011-06-30 ENCOUNTER — Ambulatory Visit (INDEPENDENT_AMBULATORY_CARE_PROVIDER_SITE_OTHER): Payer: Medicare Other | Admitting: Pulmonary Disease

## 2011-06-30 ENCOUNTER — Encounter: Payer: Self-pay | Admitting: Pulmonary Disease

## 2011-06-30 DIAGNOSIS — J45909 Unspecified asthma, uncomplicated: Secondary | ICD-10-CM

## 2011-06-30 NOTE — Progress Notes (Signed)
  Subjective:    Patient ID: [name] [name], female    DOB: [birth year], 65 y.o.   MRN: [id number]  HPI 65 yo AAF, ex smoker with asthmatic bronchitis, GERD and a component of VCD.  Worse during spring, fall & extreme heat. On xopenex MDI due to after taste & palpitations with albuterol  9/09--required 2 rounds of Abx & prednisone to resolve congestion  4/10 & 11/10-required prednisone dosepak    06/30/2011 Annual FU - no recent flares Had cut down advair 100/50 to once  A day -. Pt states she had some chest tightness during the days that the temp was 100 degrees and she increased advair back to twice daily. Denies chest pain, dyspnea, orthopnea, hemoptysis, fever, n/v/d, edema, headache,recent travel or antibiotics, nocturnal awakening.    Review of Systems Pt denies any significant  nasal congestion or excess secretions, fever, chills, sweats, unintended wt loss, pleuritic or exertional cp, orthopnea pnd or leg swelling.  Pt also denies any obvious fluctuation in symptoms with weather or environmental change or other alleviating or aggravating factors.    Pt denies any increase in rescue therapy over baseline, denies waking up needing it or having early am exacerbations or coughing/wheezing/ or dyspnea       Objective:   Physical Exam Gen. Pleasant, well-nourished, in no distress ENT - no lesions, no post nasal drip Neck: No JVD, no thyromegaly, no carotid bruits Lungs: no use of accessory muscles, no dullness to percussion, clear without rales or rhonchi  Cardiovascular: Rhythm regular, heart sounds  normal, no murmurs or gallops, no peripheral edema Musculoskeletal: No deformities, no cyanosis or clubbing         Assessment & Plan:

## 2011-06-30 NOTE — Patient Instructions (Signed)
Stay on advair twice daily Albuterol 2 puffs for rescue only

## 2011-07-01 NOTE — Assessment & Plan Note (Signed)
OK to cut down advair once she starts feeling better & use SABA prn. Discussed environmental control, allergen avoidance & GERD control. Discussed early signs of flare

## 2011-07-19 ENCOUNTER — Other Ambulatory Visit: Payer: Self-pay

## 2011-07-19 MED ORDER — OMEPRAZOLE 20 MG PO CPDR: 20.0000 mg | DELAYED_RELEASE_CAPSULE | Freq: Every day | ORAL | Status: DC

## 2011-08-23 ENCOUNTER — Other Ambulatory Visit: Payer: Self-pay | Admitting: Internal Medicine

## 2011-09-05 ENCOUNTER — Other Ambulatory Visit: Payer: Self-pay | Admitting: Internal Medicine

## 2011-10-10 ENCOUNTER — Other Ambulatory Visit (INDEPENDENT_AMBULATORY_CARE_PROVIDER_SITE_OTHER): Payer: Medicare Other

## 2011-10-10 DIAGNOSIS — R7302 Impaired glucose tolerance (oral): Secondary | ICD-10-CM

## 2011-10-10 DIAGNOSIS — E119 Type 2 diabetes mellitus without complications: Secondary | ICD-10-CM

## 2011-10-10 DIAGNOSIS — R7309 Other abnormal glucose: Secondary | ICD-10-CM

## 2011-10-10 DIAGNOSIS — I10 Essential (primary) hypertension: Secondary | ICD-10-CM

## 2011-10-10 LAB — BASIC METABOLIC PANEL
CO2: 28 mEq/L (ref 19–32)
Calcium: 8.9 mg/dL (ref 8.4–10.5)
Sodium: 141 mEq/L (ref 135–145)

## 2011-10-10 LAB — LIPID PANEL
HDL: 47.3 mg/dL (ref >39.00)
Total CHOL/HDL Ratio: 3

## 2011-10-10 LAB — HEMOGLOBIN A1C: Hgb A1c MFr Bld: 6.3 % (ref 4.6–6.5)

## 2011-10-19 ENCOUNTER — Ambulatory Visit (INDEPENDENT_AMBULATORY_CARE_PROVIDER_SITE_OTHER): Payer: Medicare Other | Admitting: Internal Medicine

## 2011-10-19 ENCOUNTER — Encounter: Payer: Self-pay | Admitting: Internal Medicine

## 2011-10-19 VITALS — BP 106/68 | HR 71 | Temp 98.4°F | Ht 63.0 in | Wt 155.0 lb

## 2011-10-19 DIAGNOSIS — J449 Chronic obstructive pulmonary disease, unspecified: Secondary | ICD-10-CM

## 2011-10-19 DIAGNOSIS — I10 Essential (primary) hypertension: Secondary | ICD-10-CM

## 2011-10-19 DIAGNOSIS — E119 Type 2 diabetes mellitus without complications: Secondary | ICD-10-CM

## 2011-10-19 DIAGNOSIS — Z0000 Encounter for general adult medical examination without abnormal findings: Secondary | ICD-10-CM

## 2011-10-19 DIAGNOSIS — L259 Unspecified contact dermatitis, unspecified cause: Secondary | ICD-10-CM

## 2011-10-19 DIAGNOSIS — L309 Dermatitis, unspecified: Secondary | ICD-10-CM | POA: Insufficient documentation

## 2011-10-19 MED ORDER — CLOBETASOL PROPIONATE 0.05 % EX OINT: TOPICAL_OINTMENT | CUTANEOUS | Status: AC

## 2011-10-19 MED ORDER — TRIAMCINOLONE ACETONIDE 0.1 % EX CREA: TOPICAL_CREAM | Freq: Two times a day (BID) | CUTANEOUS | Status: DC | PRN

## 2011-10-19 MED ORDER — FLUOCINOLONE ACETONIDE 0.025 % EX OINT: TOPICAL_OINTMENT | Freq: Two times a day (BID) | CUTANEOUS | Status: DC | PRN

## 2011-10-19 NOTE — Progress Notes (Signed)
Subjective:    Patient ID: [name] [name], female    DOB: 1946/09/20, 65 y.o.   MRN: [id number]  HPI  Here to f/u; overall doing ok,  Pt denies chest pain, increased sob or doe, wheezing, orthopnea, PND, increased LE swelling, palpitations, dizziness or syncope.  Pt denies new neurological symptoms such as new headache, or facial or extremity weakness or numbness   Pt denies polydipsia, polyuria, or low sugar symptoms such as weakness or confusion improved with po intake.  Pt states overall good compliance with meds, trying to follow lower cholesterol, diabetic diet, wt overall stable but little exercise however. Has signficiant flare of eczema to extremities quite severe .  Denies worsening depressive symptoms, suicidal ideation, or panic. Past Medical History  Diagnosis Date  . Psoriasis   . Eczema   . DIABETES MELLITUS 01/30/2008  . GOITER, MULTINODULAR 05/21/2010  . GLUCOSE INTOLERANCE 09/03/2007  . HYPERCHOLESTEROLEMIA 09/03/2007  . HYPERTENSION 09/03/2007  . CORONARY ARTERY DISEASE 09/03/2007  . ASTHMA 09/03/2007  . COPD 09/03/2007  . GERD 09/03/2007  . OBESITY 09/03/2007  . DEGENERATIVE JOINT DISEASE 01/30/2008  . ARTHRITIS 01/30/2008  . COLON CANCER 04/07/2008  . LVH (left ventricular hypertrophy)   . Osteopenia   . Impaired glucose tolerance 04/10/2011   Past Surgical History  Procedure Date  . Abdominal hysterectomy   . Ptca   . Hernia repair   . Tubal ligation   . Hemicolectomy     rt    reports that she quit smoking about 32 years ago. Her smoking use included Cigarettes. She has a 15 pack-year smoking history. She does not have any smokeless tobacco history on file. She reports that she does not drink alcohol or use illicit drugs. family history includes Cancer in her other; Colon cancer in her other; Liver disease in her father; and Thyroid disease in her brother and sister. Allergies  Allergen Reactions  . Simvastatin    Current Outpatient Prescriptions on File Prior to  Visit  Medication Sig Dispense Refill  . acyclovir (ZOVIRAX) 5 % cream Apply 1 application topically every 3 (three) hours.  15 g  2  . amLODipine (NORVASC) 5 MG tablet Take 5 mg by mouth daily.        Marland Kitchen aspirin 325 MG EC tablet Take 325 mg by mouth daily.        Marland Kitchen desonide (DESOWEN) 0.05 % lotion Apply topically. Use under breast two times a day as needed       . fexofenadine (ALLEGRA) 180 MG tablet Take 180 mg by mouth daily.        . fluticasone (FLONASE) 50 MCG/ACT nasal spray 2 sprays by Nasal route daily.        Marland Kitchen ketoconazole (NIZORAL) 2 % cream Apply topically. use as directed two times a day as needed under breast       . Lancets (FREESTYLE) lancets 1 each by Other route. Use as directed       . lovastatin (MEVACOR) 40 MG tablet Take 40 mg by mouth 2 (two) times daily.        . metFORMIN (GLUCOPHAGE) 500 MG tablet Take 500 mg by mouth 2 (two) times daily.        . metoprolol tartrate (LOPRESSOR) 25 MG tablet Take 25 mg by mouth. 1 and 1/2 by mouth two times a day       . naproxen (NAPROSYN) 500 MG tablet TAKE 1 TABLET TWICE A DAY AS NEEDED  60 tablet  4  . omeprazole (PRILOSEC) 20 MG capsule Take 1 capsule (20 mg total) by mouth daily.  90 capsule  3  . Fluticasone-Salmeterol (ADVAIR DISKUS) 100-50 MCG/DOSE AEPB Inhale 1 puff into the lungs 2 (two) times daily.        Marland Kitchen FREESTYLE LITE test strip TEST ONCE DAILY AS DIRECTED  100 each  9     Review of Systems Review of Systems  Constitutional: Negative for diaphoresis and unexpected weight change.  HENT: Negative for drooling and tinnitus.   Eyes: Negative for photophobia and visual disturbance.  Respiratory: Negative for choking and stridor.   Gastrointestinal: Negative for vomiting and blood in stool.  Genitourinary: Negative for hematuria and decreased urine volume.     Objective:   Physical Exam BP 106/68  Pulse 71  Temp(Src) 98.4 F (36.9 C) (Oral)  Ht 5\' 3"  (1.6 m)  Wt 155 lb (70.308 kg)  BMI 27.46 kg/m2  SpO2  97% Physical Exam  VS noted Constitutional: Pt appears well-developed and well-nourished.  HENT: Head: Normocephalic.  Right Ear: External ear normal.  Left Ear: External ear normal.  Eyes: Conjunctivae and EOM are normal. Pupils are equal, round, and reactive to light.  Neck: Normal range of motion. Neck supple.  Cardiovascular: Normal rate and regular rhythm.   Pulmonary/Chest: Effort normal and breath sounds decreased but no rales or wheezing.  Abd:  Soft, NT, non-distended, + BS Neurological: Pt is alert. No cranial nerve deficit.  Skin: Skin is warm. No erythema. except for severe eczematous changes to hands Psychiatric: Pt behavior is normal. Thought content normal.     Assessment & Plan:

## 2011-10-19 NOTE — Patient Instructions (Signed)
The three ointment/cream were refilled to the pharmacy Continue all other medications as before Please return in 6 mo with Lab testing done 3-5 days before

## 2011-10-20 ENCOUNTER — Other Ambulatory Visit: Payer: Self-pay | Admitting: Internal Medicine

## 2011-10-23 ENCOUNTER — Encounter: Payer: Self-pay | Admitting: Internal Medicine

## 2011-10-23 NOTE — Assessment & Plan Note (Signed)
stable overall by hx and exam, most recent data reviewed with pt, and pt to continue medical treatment as before  Lab Results  Component Value Date   HGBA1C 6.3 10/10/2011

## 2011-10-23 NOTE — Assessment & Plan Note (Signed)
stable overall by hx and exam, most recent data reviewed with pt, and pt to continue medical treatment as before  BP Readings from Last 3 Encounters:  10/19/11 106/68  06/30/11 136/78  04/15/11 110/70

## 2011-10-23 NOTE — Assessment & Plan Note (Signed)
stable overall by hx and exam, most recent data reviewed with pt, and pt to continue medical treatment as before  SpO2 Readings from Last 3 Encounters:  10/19/11 97%  06/30/11 94%  04/15/11 97%

## 2011-10-23 NOTE — Assessment & Plan Note (Signed)
Mod to severe changes to hands, for topical steroid cream asd,  to f/u any worsening symptoms or concerns

## 2011-10-26 ENCOUNTER — Other Ambulatory Visit: Payer: Self-pay | Admitting: Internal Medicine

## 2011-10-26 DIAGNOSIS — Z1231 Encounter for screening mammogram for malignant neoplasm of breast: Secondary | ICD-10-CM

## 2011-11-22 ENCOUNTER — Other Ambulatory Visit: Payer: Self-pay | Admitting: Internal Medicine

## 2011-11-25 ENCOUNTER — Ambulatory Visit (HOSPITAL_COMMUNITY): Payer: Medicare Other

## 2011-12-21 ENCOUNTER — Other Ambulatory Visit: Payer: Self-pay

## 2011-12-21 MED ORDER — METOPROLOL TARTRATE 25 MG PO TABS: ORAL_TABLET | ORAL | Status: DC

## 2011-12-26 ENCOUNTER — Encounter: Payer: Self-pay | Admitting: Adult Health

## 2011-12-26 ENCOUNTER — Ambulatory Visit (INDEPENDENT_AMBULATORY_CARE_PROVIDER_SITE_OTHER)
Admission: RE | Admit: 2011-12-26 | Discharge: 2011-12-26 | Disposition: A | Discharge status: Home / Self Care | Payer: Medicare Other | Source: Ambulatory Visit | Attending: Adult Health | Admitting: Adult Health

## 2011-12-26 ENCOUNTER — Ambulatory Visit (INDEPENDENT_AMBULATORY_CARE_PROVIDER_SITE_OTHER): Payer: Medicare Other | Admitting: Adult Health

## 2011-12-26 VITALS — BP 120/72 | HR 74 | Temp 97.2°F | Ht 63.0 in | Wt 156.8 lb

## 2011-12-26 DIAGNOSIS — J449 Chronic obstructive pulmonary disease, unspecified: Secondary | ICD-10-CM

## 2011-12-26 NOTE — Patient Instructions (Signed)
Add Claritin 10mg  daily As needed  For Drainage  Increase Advair 250/50 Twice daily  Until sample is done then back to Advair 100 Twice daily   Saline nasal rinses As needed   Call back if not improving .  Please contact office for sooner follow up if symptoms do not improve or worsen or seek emergency care  follow up Dr. Vassie Loll  In 6 months and As needed   I will call with xray results.

## 2011-12-26 NOTE — Progress Notes (Signed)
  Subjective:    Patient ID: [name] [name], female    DOB: [birth year], 66 y.o.   MRN: [id number]  HPI  66 yo AAF, ex smoker with asthmatic bronchitis, GERD and a component of VCD.  Worse during spring, fall & extreme heat. On xopenex MDI due to after taste & palpitations with albuterol  9/09--required 2 rounds of Abx & prednisone to resolve congestion  4/10 & 11/10-required prednisone dosepak    06/30/2011 Annual FU - no recent flares Had cut down advair 100/50 to once  A day -. Pt states she had some chest tightness during the days that the temp was 100 degrees and she increased advair back to twice daily. > no changes  12/26/2011 Follow up  Returns for follow up. More cough than usual esp at night. No overt reflux . No discolored mucus. Or fever. More use of proair than usual. Some drainage in throat and nasal stuffiness.  Had cold recently with nasal drip /drainage that is resolving.  Has a lot of throat clearing.     Review of Systems  Constitutional:   No  weight loss, night sweats,  Fevers, chills, fatigue, or  lassitude.  HEENT:   No headaches,  Difficulty swallowing,  Tooth/dental problems, or  Sore throat,                No sneezing, itching, ear ache, + nasal congestion, post nasal drip,   CV:  No chest pain,  Orthopnea, PND, swelling in lower extremities, anasarca, dizziness, palpitations, syncope.   GI  No heartburn, indigestion, abdominal pain, nausea, vomiting, diarrhea, change in bowel habits, loss of appetite, bloody stools.   Resp:   No excess mucus, no productive cough,    No coughing up of blood.  No change in color of mucus.  No wheezing.  No chest wall deformity  Skin: no rash or lesions.  GU: no dysuria, change in color of urine, no urgency or frequency.  No flank pain, no hematuria   MS:  No joint pain or swelling.  No decreased range of motion.  No back pain.  Psych:  No change in mood or affect. No depression or anxiety.  No memory loss.         Objective:   Physical Exam  Gen. Pleasant, well-nourished, in no distress ENT - no lesions, no post nasal drip Neck: No JVD, no thyromegaly, no carotid bruits Lungs: no use of accessory muscles, no dullness to percussion, clear without rales or rhonchi  Cardiovascular: Rhythm regular, heart sounds  normal, no murmurs or gallops, no peripheral edema Musculoskeletal: No deformities, no cyanosis or clubbing         Assessment & Plan:

## 2011-12-26 NOTE — Progress Notes (Signed)
Addended by: Marcellus Scott on: 12/26/2011 10:42 AM   Modules accepted: Orders

## 2011-12-26 NOTE — Assessment & Plan Note (Signed)
Mild Flare:   Plan;  Add Claritin 10mg  daily As needed  For Drainage  Increase Advair 250/50 Twice daily  Until sample is done then back to Advair 100 Twice daily   Saline nasal rinses As needed   Call back if not improving .  Please contact office for sooner follow up if symptoms do not improve or worsen or seek emergency care  follow up Dr. Vassie Loll  In 6 months and As needed   I will call with xray results.

## 2012-01-09 ENCOUNTER — Telehealth: Payer: Self-pay | Admitting: Pulmonary Disease

## 2012-01-09 MED ORDER — FLUTICASONE-SALMETEROL 250-50 MCG/DOSE IN AEPB: 1.0000 | INHALATION_SPRAY | Freq: Two times a day (BID) | RESPIRATORY_TRACT | Status: DC

## 2012-01-09 NOTE — Telephone Encounter (Signed)
Pt is aware of dosage change in her Advair and a new RX has been sent to her pharmacy.

## 2012-01-09 NOTE — Telephone Encounter (Signed)
Pt feels she is doing better on the 250/50 dose of Advair and wants to stay on this.  Please advise if this is okay to call in for her. -  ( Pt instructed that if she didn't hear back from [place] then she would know that rx was sent to pharmacy)

## 2012-01-09 NOTE — Telephone Encounter (Signed)
That is fine.  Advair 250/50 1 puff Twice daily  #1 , 5 refills.  follow up as planned and As needed

## 2012-03-28 ENCOUNTER — Encounter: Payer: Self-pay | Admitting: Endocrinology

## 2012-03-28 ENCOUNTER — Ambulatory Visit (INDEPENDENT_AMBULATORY_CARE_PROVIDER_SITE_OTHER): Payer: Medicare Other | Admitting: Endocrinology

## 2012-03-28 VITALS — BP 132/84 | HR 69 | Temp 98.5°F | Ht 63.0 in | Wt 157.0 lb

## 2012-03-28 DIAGNOSIS — E042 Nontoxic multinodular goiter: Secondary | ICD-10-CM

## 2012-03-28 NOTE — Progress Notes (Signed)
Subjective:    Patient ID: [name] [name], female    DOB: Apr 19, 1946, 66 y.o.   MRN: [id number]  HPI Pt returns for f/u of a small multinodular goiter (2011).  F/U ultrasound in 2012 was unchanged.  She has not needed a bx  She says she does not notice the goiter.  pt states she feels well in general. Past Medical History  Diagnosis Date  . Psoriasis   . Eczema   . DIABETES MELLITUS 01/30/2008  . GOITER, MULTINODULAR 05/21/2010  . GLUCOSE INTOLERANCE 09/03/2007  . HYPERCHOLESTEROLEMIA 09/03/2007  . HYPERTENSION 09/03/2007  . CORONARY ARTERY DISEASE 09/03/2007  . ASTHMA 09/03/2007  . COPD 09/03/2007  . GERD 09/03/2007  . OBESITY 09/03/2007  . DEGENERATIVE JOINT DISEASE 01/30/2008  . ARTHRITIS 01/30/2008  . COLON CANCER 04/07/2008  . LVH (left ventricular hypertrophy)   . Osteopenia   . Impaired glucose tolerance 04/10/2011    Past Surgical History  Procedure Date  . Abdominal hysterectomy   . Ptca   . Hernia repair   . Tubal ligation   . Hemicolectomy     rt    History   Social History  . Marital Status: Married    Spouse Name: N/A    Number of Children: 4  . Years of Education: N/A   Occupational History  . stay at home    Social History Main Topics  . Smoking status: Former Smoker -- 1.0 packs/day for 15 years    Types: Cigarettes    Quit date: 12/12/1978  . Smokeless tobacco: Not on file  . Alcohol Use: No  . Drug Use: No  . Sexually Active: Not on file   Other Topics Concern  . Not on file   Social History Narrative   1 child died with meningitis    Current Outpatient Prescriptions on File Prior to Visit  Medication Sig Dispense Refill  . acyclovir (ZOVIRAX) 5 % cream Apply 1 application topically every 3 (three) hours.  15 g  2  . amLODipine (NORVASC) 5 MG tablet TAKE 1 TABLET EVERY DAY  90 tablet  2  . aspirin 325 MG EC tablet Take 325 mg by mouth daily.        . clobetasol (TEMOVATE) 0.05 % ointment Use twice once daily to hand and feet as needed  30 g  1   . desonide (DESOWEN) 0.05 % lotion Apply topically. Use under breast two times a day as needed       . fluocinolone (SYNALAR) 0.025 % ointment Apply topically 2 (two) times daily as needed.  30 g  1  . Fluticasone-Salmeterol (ADVAIR DISKUS) 250-50 MCG/DOSE AEPB Inhale 1 puff into the lungs 2 (two) times daily.  60 each  5  . FREESTYLE LITE test strip TEST ONCE DAILY AS DIRECTED  100 each  9  . hydrochlorothiazide (HYDRODIURIL) 25 MG tablet TAKE 1 TABLET EVERY DAY  31 tablet  11  . ketoconazole (NIZORAL) 2 % cream Apply topically. use as directed two times a day as needed under breast       . Lancets (FREESTYLE) lancets USE AS DIRECTED  100 each  1  . lovastatin (MEVACOR) 40 MG tablet TAKE 2 TABLETS EVERY DAY  60 tablet  9  . metoprolol tartrate (LOPRESSOR) 25 MG tablet 1 and 1/2 by mouth two times a day  30 tablet  11  . naproxen (NAPROSYN) 500 MG tablet TAKE 1 TABLET TWICE A DAY AS NEEDED  60 tablet  4  .  omeprazole (PRILOSEC) 20 MG capsule Take 1 capsule (20 mg total) by mouth daily.  90 capsule  3  . triamcinolone (KENALOG) 0.1 % cream Apply topically 2 (two) times daily as needed.  30 g  1    Allergies  Allergen Reactions  . Simvastatin     Family History  Problem Relation Age of Onset  . Liver disease Father   . Thyroid disease Sister     overactive  . Thyroid disease Brother     overactive  . Colon cancer Other     aunt  . Cancer Other     Colon Cancer-Aunt    BP 132/84  Pulse 69  Temp(Src) 98.5 F (36.9 C) (Oral)  Ht 5\' 3"  (1.6 m)  Wt 157 lb (71.215 kg)  BMI 27.81 kg/m2  SpO2 96%    Review of Systems Denies weight change    Objective:   Physical Exam VITAL SIGNS:  See vs page GENERAL: no distress NECK: There is no palpable thyroid enlargement.  No thyroid nodule is palpable.  No palpable lymphadenopathy at the anterior neck.     Assessment & Plan:  Multinodular goiter, clinically unchanged

## 2012-03-28 NOTE — Patient Instructions (Signed)
most of the time, a "lumpy thyroid" will eventually become overactive.  this is usually a slow process, happening over the span of many years. Please return in 1 year, when you will be due for another ultrasound.

## 2012-04-11 ENCOUNTER — Other Ambulatory Visit (INDEPENDENT_AMBULATORY_CARE_PROVIDER_SITE_OTHER): Payer: Medicare Other

## 2012-04-11 ENCOUNTER — Encounter: Payer: Self-pay | Admitting: Internal Medicine

## 2012-04-11 DIAGNOSIS — E119 Type 2 diabetes mellitus without complications: Secondary | ICD-10-CM

## 2012-04-11 DIAGNOSIS — Z0000 Encounter for general adult medical examination without abnormal findings: Secondary | ICD-10-CM

## 2012-04-11 LAB — CBC WITH DIFFERENTIAL/PLATELET
Eosinophils Absolute: 0.4 10*3/uL (ref 0.0–0.7)
Eosinophils Relative: 4.7 % (ref 0.0–5.0)
HCT: 40.5 % (ref 36.0–46.0)
Lymphs Abs: 2.5 10*3/uL (ref 0.7–4.0)
MCHC: 32.3 g/dL (ref 30.0–36.0)
MCV: 82.3 fl (ref 78.0–100.0)
Monocytes Absolute: 0.5 10*3/uL (ref 0.1–1.0)
Neutrophils Relative %: 57.7 % (ref 43.0–77.0)
Platelets: 201 10*3/uL (ref 150.0–400.0)
RDW: 15.6 % — ABNORMAL HIGH (ref 11.5–14.6)

## 2012-04-11 LAB — HEPATIC FUNCTION PANEL
ALT: 18 U/L (ref 0–35)
AST: 20 U/L (ref 0–37)
Alkaline Phosphatase: 64 U/L (ref 39–117)
Bilirubin, Direct: 0.1 mg/dL (ref 0.0–0.3)
Total Bilirubin: 0.7 mg/dL (ref 0.3–1.2)

## 2012-04-11 LAB — LIPID PANEL
Cholesterol: 147 mg/dL (ref 0–200)
LDL Cholesterol: 75 mg/dL (ref 0–99)
VLDL: 17.2 mg/dL (ref 0.0–40.0)

## 2012-04-11 LAB — MICROALBUMIN / CREATININE URINE RATIO
Creatinine,U: 105.3 mg/dL
Microalb Creat Ratio: 0.5 mg/g (ref 0.0–30.0)
Microalb, Ur: 0.5 mg/dL (ref 0.0–1.9)

## 2012-04-11 LAB — URINALYSIS, ROUTINE W REFLEX MICROSCOPIC
Bilirubin Urine: NEGATIVE
Ketones, ur: NEGATIVE
Leukocytes, UA: NEGATIVE
Nitrite: NEGATIVE
pH: 6 (ref 5.0–8.0)

## 2012-04-11 LAB — BASIC METABOLIC PANEL
BUN: 17 mg/dL (ref 6–23)
Chloride: 105 mEq/L (ref 96–112)
GFR: 111.13 mL/min (ref >60.00)
Potassium: 3.9 mEq/L (ref 3.5–5.1)
Sodium: 143 mEq/L (ref 135–145)

## 2012-04-11 LAB — TSH: TSH: 0.43 u[IU]/mL (ref 0.35–5.50)

## 2012-04-16 ENCOUNTER — Ambulatory Visit (INDEPENDENT_AMBULATORY_CARE_PROVIDER_SITE_OTHER): Payer: Medicare Other | Admitting: Internal Medicine

## 2012-04-16 ENCOUNTER — Encounter: Payer: Self-pay | Admitting: Internal Medicine

## 2012-04-16 VITALS — BP 120/72 | HR 76 | Temp 97.8°F | Ht 64.0 in | Wt 155.4 lb

## 2012-04-16 DIAGNOSIS — Z0000 Encounter for general adult medical examination without abnormal findings: Secondary | ICD-10-CM

## 2012-04-16 DIAGNOSIS — E119 Type 2 diabetes mellitus without complications: Secondary | ICD-10-CM

## 2012-04-16 NOTE — Assessment & Plan Note (Signed)

## 2012-04-16 NOTE — Progress Notes (Signed)
Subjective:    Patient ID: [name] [name], female    DOB: [birth year], 66 y.o.   MRN: [id number]  HPI  Here for wellness and f/u;  Overall doing ok;  Pt denies CP, worsening SOB, DOE, wheezing, orthopnea, PND, worsening LE edema, palpitations, dizziness or syncope.  Pt denies neurological change such as new Headache, facial or extremity weakness.  Pt denies polydipsia, polyuria, or low sugar symptoms. Pt states overall good compliance with treatment and medications, good tolerability, and trying to follow lower cholesterol diet.  Pt denies worsening depressive symptoms, suicidal ideation or panic. No fever, wt loss, night sweats, loss of appetite, or other constitutional symptoms.  Pt states good ability with ADL's, low fall risk, home safety reviewed and adequate, no significant changes in hearing or vision, and occasionally active with exercise.  No acute complaints Past Medical History  Diagnosis Date  . Psoriasis   . Eczema   . DIABETES MELLITUS 01/30/2008  . GOITER, MULTINODULAR 05/21/2010  . GLUCOSE INTOLERANCE 09/03/2007  . HYPERCHOLESTEROLEMIA 09/03/2007  . HYPERTENSION 09/03/2007  . CORONARY ARTERY DISEASE 09/03/2007  . ASTHMA 09/03/2007  . COPD 09/03/2007  . GERD 09/03/2007  . OBESITY 09/03/2007  . DEGENERATIVE JOINT DISEASE 01/30/2008  . ARTHRITIS 01/30/2008  . COLON CANCER 04/07/2008  . LVH (left ventricular hypertrophy)   . Osteopenia   . Impaired glucose tolerance 04/10/2011   Past Surgical History  Procedure Date  . Abdominal hysterectomy   . Ptca   . Hernia repair   . Tubal ligation   . Hemicolectomy     rt    reports that she quit smoking about 33 years ago. Her smoking use included Cigarettes. She has a 15 pack-year smoking history. She does not have any smokeless tobacco history on file. She reports that she does not drink alcohol or use illicit drugs. family history includes Cancer in her other; Colon cancer in her other; Liver disease in her father; and Thyroid disease in  her brother and sister. Allergies  Allergen Reactions  . Simvastatin    Current Outpatient Prescriptions on File Prior to Visit  Medication Sig Dispense Refill  . acyclovir (ZOVIRAX) 5 % cream Apply 1 application topically every 3 (three) hours.  15 g  2  . amLODipine (NORVASC) 5 MG tablet TAKE 1 TABLET EVERY DAY  90 tablet  2  . aspirin 325 MG EC tablet Take 325 mg by mouth daily.        . clobetasol (TEMOVATE) 0.05 % ointment Use twice once daily to hand and feet as needed  30 g  1  . desonide (DESOWEN) 0.05 % lotion Apply topically. Use under breast two times a day as needed       . fluocinolone (SYNALAR) 0.025 % ointment Apply topically 2 (two) times daily as needed.  30 g  1  . Fluticasone-Salmeterol (ADVAIR DISKUS) 250-50 MCG/DOSE AEPB Inhale 1 puff into the lungs 2 (two) times daily.  60 each  5  . FREESTYLE LITE test strip TEST ONCE DAILY AS DIRECTED  100 each  9  . hydrochlorothiazide (HYDRODIURIL) 25 MG tablet TAKE 1 TABLET EVERY DAY  31 tablet  11  . ketoconazole (NIZORAL) 2 % cream Apply topically. use as directed two times a day as needed under breast       . Lancets (FREESTYLE) lancets USE AS DIRECTED  100 each  1  . lovastatin (MEVACOR) 40 MG tablet TAKE 2 TABLETS EVERY DAY  60 tablet  9  . metFORMIN (  GLUCOPHAGE-XR) 500 MG 24 hr tablet 2 tablets by mouth once daily      . metoprolol tartrate (LOPRESSOR) 25 MG tablet 1 and 1/2 by mouth two times a day  30 tablet  11  . naproxen (NAPROSYN) 500 MG tablet TAKE 1 TABLET TWICE A DAY AS NEEDED  60 tablet  4  . omeprazole (PRILOSEC) 20 MG capsule Take 1 capsule (20 mg total) by mouth daily.  90 capsule  3  . triamcinolone (KENALOG) 0.1 % cream Apply topically 2 (two) times daily as needed.  30 g  1   Review of Systems Review of Systems  Constitutional: Negative for diaphoresis, activity change, appetite change and unexpected weight change.  HENT: Negative for hearing loss, ear pain, facial swelling, mouth sores and neck stiffness.    Eyes: Negative for pain, redness and visual disturbance.  Respiratory: Negative for shortness of breath and wheezing.   Cardiovascular: Negative for chest pain and palpitations.  Gastrointestinal: Negative for diarrhea, blood in stool, abdominal distention and rectal pain.  Genitourinary: Negative for hematuria, flank pain and decreased urine volume.  Musculoskeletal: Negative for myalgias and joint swelling.  Skin: Negative for color change and wound.  Neurological: Negative for syncope and numbness.  Hematological: Negative for adenopathy.  Psychiatric/Behavioral: Negative for hallucinations, self-injury, decreased concentration and agitation.      Objective:   Physical Exam BP 120/72  Pulse 76  Temp(Src) 97.8 F (36.6 C) (Oral)  Ht 5\' 4"  (1.626 m)  Wt 155 lb 6 oz (70.478 kg)  BMI 26.67 kg/m2  SpO2 95% Physical Exam  VS noted Constitutional: Pt is oriented to person, place, and time. Appears well-developed and well-nourished.  HENT:  Head: Normocephalic and atraumatic.  Right Ear: External ear normal.  Left Ear: External ear normal.  Nose: Nose normal.  Mouth/Throat: Oropharynx is clear and moist.  Eyes: Conjunctivae and EOM are normal. Pupils are equal, round, and reactive to light.  Neck: Normal range of motion. Neck supple. No JVD present. No tracheal deviation present.  Cardiovascular: Normal rate, regular rhythm, normal heart sounds and intact distal pulses.   Pulmonary/Chest: Effort normal and breath sounds normal.  Abdominal: Soft. Bowel sounds are normal. There is no tenderness.  Musculoskeletal: Normal range of motion. Exhibits no edema.  Lymphadenopathy:  Has no cervical adenopathy.  Neurological: Pt is alert and oriented to person, place, and time. Pt has normal reflexes. Motor/dtr/gait intact Skin: Skin is warm and dry. No rash noted.  Psychiatric:  Has  normal mood and affect. Behavior is normal.     Assessment & Plan:

## 2012-04-16 NOTE — Patient Instructions (Addendum)
Please remember to followup the yearly mammogram Continue all other medications as before Please have the pharmacy call with any refills you may need. Your EKG was ok today You are otherwise up to date with prevention Please continue your efforts at being more active, low cholesterol diet, and weight control.

## 2012-04-16 NOTE — Assessment & Plan Note (Signed)
Lab Results  Component Value Date   HGBA1C 6.3 04/11/2012   stable overall by hx and exam, most recent data reviewed with pt, and pt to continue medical treatment as before

## 2012-05-23 ENCOUNTER — Other Ambulatory Visit: Payer: Self-pay | Admitting: Internal Medicine

## 2012-06-13 ENCOUNTER — Ambulatory Visit (INDEPENDENT_AMBULATORY_CARE_PROVIDER_SITE_OTHER): Payer: Medicare Other | Admitting: Internal Medicine

## 2012-06-13 ENCOUNTER — Encounter: Payer: Self-pay | Admitting: Internal Medicine

## 2012-06-13 ENCOUNTER — Other Ambulatory Visit: Payer: Self-pay | Admitting: Internal Medicine

## 2012-06-13 VITALS — BP 122/62 | HR 68 | Temp 97.3°F | Ht 63.0 in | Wt 151.2 lb

## 2012-06-13 DIAGNOSIS — M79671 Pain in right foot: Secondary | ICD-10-CM | POA: Insufficient documentation

## 2012-06-13 DIAGNOSIS — I10 Essential (primary) hypertension: Secondary | ICD-10-CM

## 2012-06-13 DIAGNOSIS — M79609 Pain in unspecified limb: Secondary | ICD-10-CM

## 2012-06-13 DIAGNOSIS — G4710 Hypersomnia, unspecified: Secondary | ICD-10-CM

## 2012-06-13 MED ORDER — METHYLPREDNISOLONE ACETATE 80 MG/ML IJ SUSP
120.0000 mg | Freq: Once | INTRAMUSCULAR | Status: AC
  Administered 2012-06-13: 120 mg via INTRAMUSCULAR

## 2012-06-13 MED ORDER — PREDNISONE 10 MG PO TABS: 10.0000 mg | ORAL_TABLET | Freq: Every day | ORAL | Status: DC

## 2012-06-13 MED ORDER — TRAMADOL HCL 50 MG PO TABS: 50.0000 mg | ORAL_TABLET | Freq: Four times a day (QID) | ORAL | Status: AC | PRN

## 2012-06-13 NOTE — Patient Instructions (Addendum)
You had the steroid shot today Take all new medications as prescribed  - the prednisone, and pain medication Continue all other medications as before Please have the pharmacy call with any refills you may need. You will be contacted regarding the referral for: podiatry Please keep your appointments with your specialists as you have planned - Dr Vassie Loll Please remember to mention to Dr Vassie Loll about the daytime sleepiness as you may need evaluation for sleep apnea Please continue your efforts at being more active, low cholesterol diet, and weight control.

## 2012-06-14 ENCOUNTER — Encounter: Payer: Self-pay | Admitting: Internal Medicine

## 2012-06-14 DIAGNOSIS — G4710 Hypersomnia, unspecified: Secondary | ICD-10-CM | POA: Insufficient documentation

## 2012-06-14 NOTE — Assessment & Plan Note (Signed)
stable overall by hx and exam, most recent data reviewed with pt, and pt to continue medical treatment as before BP Readings from Last 3 Encounters:  06/13/12 122/62  04/16/12 120/72  03/28/12 132/84

## 2012-06-14 NOTE — Progress Notes (Signed)
Subjective:    Patient ID: [name] [name], female    DOB: 1946-08-24, 66 y.o.   MRN: [id number]  HPI  Here to f/u with c/o mild to mod several months distal right foot pain, shooting bee like pains with numbness worse to the plantar aspect, some ongoing during the day, but worse at night, alleve helps, nothing else makes worse; denies weakness or HA, back pain or other extrmeity pain/numb/weakness.   No recent fever, trauma or hx of same.  Does have sense of ongoing fatigue, but also with signficant hypersomnolence and occasional AM headaches. At one point yrs ago was referred for sleep testing per [facility] but she did not f/u.  Does have f/u appt with pulm July 24 - dr Vassie Loll.  Pt denies chest pain, increased sob or doe, wheezing, orthopnea, PND, increased LE swelling, palpitations, dizziness or syncope.   Pt denies polydipsia, polyuria.  Pt denies fever, wt loss, night sweats, loss of appetite, or other constitutional symptoms Past Medical History  Diagnosis Date  . Psoriasis   . Eczema   . DIABETES MELLITUS 01/30/2008  . GOITER, MULTINODULAR 05/21/2010  . GLUCOSE INTOLERANCE 09/03/2007  . HYPERCHOLESTEROLEMIA 09/03/2007  . HYPERTENSION 09/03/2007  . CORONARY ARTERY DISEASE 09/03/2007  . ASTHMA 09/03/2007  . COPD 09/03/2007  . GERD 09/03/2007  . OBESITY 09/03/2007  . DEGENERATIVE JOINT DISEASE 01/30/2008  . ARTHRITIS 01/30/2008  . COLON CANCER 04/07/2008  . LVH (left ventricular hypertrophy)   . Osteopenia   . Impaired glucose tolerance 04/10/2011   Past Surgical History  Procedure Date  . Abdominal hysterectomy   . Ptca   . Hernia repair   . Tubal ligation   . Hemicolectomy     rt    reports that she quit smoking about 33 years ago. Her smoking use included Cigarettes. She has a 15 pack-year smoking history. She does not have any smokeless tobacco history on file. She reports that she does not drink alcohol or use illicit drugs. family history includes Cancer in her other; Colon cancer in  her other; Liver disease in her father; and Thyroid disease in her brother and sister. Allergies  Allergen Reactions  . Simvastatin    Current Outpatient Prescriptions on File Prior to Visit  Medication Sig Dispense Refill  . acyclovir (ZOVIRAX) 5 % cream Apply 1 application topically every 3 (three) hours.  15 g  2  . amLODipine (NORVASC) 5 MG tablet TAKE 1 TABLET EVERY DAY  90 tablet  2  . aspirin 325 MG EC tablet Take 325 mg by mouth daily.        . clobetasol (TEMOVATE) 0.05 % ointment Use twice once daily to hand and feet as needed  30 g  1  . desonide (DESOWEN) 0.05 % lotion Apply topically. Use under breast two times a day as needed       . fluocinolone (SYNALAR) 0.025 % ointment Apply topically 2 (two) times daily as needed.  30 g  1  . Fluticasone-Salmeterol (ADVAIR DISKUS) 250-50 MCG/DOSE AEPB Inhale 1 puff into the lungs 2 (two) times daily.  60 each  5  . FREESTYLE LITE test strip TEST ONCE DAILY AS DIRECTED  100 each  9  . hydrochlorothiazide (HYDRODIURIL) 25 MG tablet TAKE 1 TABLET EVERY DAY  31 tablet  11  . ketoconazole (NIZORAL) 2 % cream Apply topically. use as directed two times a day as needed under breast       . Lancets (FREESTYLE) lancets USE AS DIRECTED  100 each  1  . lovastatin (MEVACOR) 40 MG tablet TAKE 2 TABLETS EVERY DAY  60 tablet  9  . metFORMIN (GLUCOPHAGE-XR) 500 MG 24 hr tablet 2 tablets by mouth once daily      . metFORMIN (GLUCOPHAGE-XR) 500 MG 24 hr tablet TAKE 2 TABLETS EVERY DAY  180 tablet  1  . metoprolol tartrate (LOPRESSOR) 25 MG tablet 1 and 1/2 by mouth two times a day  30 tablet  11  . naproxen (NAPROSYN) 500 MG tablet TAKE 1 TABLET TWICE A DAY AS NEEDED  60 tablet  4  . omeprazole (PRILOSEC) 20 MG capsule Take 1 capsule (20 mg total) by mouth daily.  90 capsule  3  . triamcinolone (KENALOG) 0.1 % cream Apply topically 2 (two) times daily as needed.  30 g  1   Review of Systems Review of Systems  Constitutional: Negative for diaphoresis and  unexpected weight change.  HENT: Negative for drooling and tinnitus.   Eyes: Negative for photophobia and visual disturbance.  Respiratory: Negative for choking and stridor.   Gastrointestinal: Negative for vomiting and blood in stool.  Genitourinary: Negative for hematuria and decreased urine volume.  Musculoskeletal: Negative for gait problem.  Skin: Negative for color change and wound.  Neurological: Negative for tremors and numbness.     Objective:   Physical Exam BP 122/62  Pulse 68  Temp 97.3 F (36.3 C) (Oral)  Ht 5\' 3"  (1.6 m)  Wt 151 lb 4 oz (68.607 kg)  BMI 26.79 kg/m2  SpO2 97% Physical Exam  VS noted, not ill appearing Constitutional: Pt appears well-developed and well-nourished.  HENT: Head: Normocephalic.  Right Ear: External ear normal.  Left Ear: External ear normal.  Eyes: Conjunctivae and EOM are normal. Pupils are equal, round, and reactive to light.  Neck: Normal range of motion. Neck supple.  Cardiovascular: Normal rate and regular rhythm.   Pulmonary/Chest: Effort normal and breath sounds normal.  Abd:  Soft, NT, non-distended, + BS Neurological: Pt is alert. No cranial nerve deficit. Motor/dtr/sens/gait intact  Skin: Skin is warm. No erythema.  Psychiatric: Pt behavior is normal. Thought content normal.     Assessment & Plan:

## 2012-06-14 NOTE — Assessment & Plan Note (Addendum)
Unclear etiology, ? Neuritis or mortons neuroma vs DJD or other - Continue all other medications as before, refer podiatry , for trial predpack after depomedrol IM today

## 2012-06-14 NOTE — Assessment & Plan Note (Signed)
I suspect OSA, d/w pt, to f/u with pulm and may need sleep apnea testing

## 2012-06-19 ENCOUNTER — Other Ambulatory Visit: Payer: Self-pay | Admitting: Internal Medicine

## 2012-07-04 ENCOUNTER — Ambulatory Visit (INDEPENDENT_AMBULATORY_CARE_PROVIDER_SITE_OTHER): Payer: Medicare Other | Admitting: Pulmonary Disease

## 2012-07-04 ENCOUNTER — Encounter: Payer: Self-pay | Admitting: Pulmonary Disease

## 2012-07-04 VITALS — BP 116/64 | HR 56 | Temp 98.2°F | Ht 63.0 in | Wt 149.8 lb

## 2012-07-04 DIAGNOSIS — J45909 Unspecified asthma, uncomplicated: Secondary | ICD-10-CM

## 2012-07-04 DIAGNOSIS — K219 Gastro-esophageal reflux disease without esophagitis: Secondary | ICD-10-CM

## 2012-07-04 MED ORDER — ALBUTEROL SULFATE HFA 108 (90 BASE) MCG/ACT IN AERS: 2.0000 | INHALATION_SPRAY | Freq: Four times a day (QID) | RESPIRATORY_TRACT | Status: DC | PRN

## 2012-07-04 NOTE — Progress Notes (Signed)
  Subjective:    Patient ID: [name] [name], female    DOB: 1946-01-24, 66 y.o.   MRN: [id number]  HPI 66 yo AAF, ex smoker with asthmatic bronchitis, GERD and a component of VCD.  Worse during spring, fall & extreme heat. On xopenex MDI due to after taste & palpitations with albuterol  9/09--required 2 rounds of Abx & prednisone to resolve congestion  4/10 & 11/10-required prednisone dosepak   06/30/2011 Had cut down advair 100/50 to once A day   12/26/2011  increased advair to 250/50  07/04/2012 Used rescue mdi infrequently, no nocturnal symps No reflux on omeprazole C/o excessive daytime somnolence when she was saying foot pain - on pain pills advil pm     Review of Systems Patient denies significant dyspnea,cough, hemoptysis,  chest pain, palpitations, pedal edema, orthopnea, paroxysmal nocturnal dyspnea, lightheadedness, nausea, vomiting, abdominal or  leg pains      Objective:   Physical Exam  Gen. Pleasant, well-nourished, in no distress ENT - no lesions, no post nasal drip Neck: No JVD, no thyromegaly, no carotid bruits Lungs: no use of accessory muscles, no dullness to percussion, clear without rales or rhonchi  Cardiovascular: Rhythm regular, heart sounds  normal, no murmurs or gallops, no peripheral edema Musculoskeletal: No deformities, no cyanosis or clubbing        Assessment & Plan:

## 2012-07-04 NOTE — Patient Instructions (Addendum)
Stay on advair 250 Rescue inhaler as needed Hold off on sleep study since symptoms resolved

## 2012-07-05 NOTE — Assessment & Plan Note (Signed)
Mild persistent Can step down to 100/50 bid , but she is resistnant to doing this since she has achieved good control of symptoms on this dose. Revisit in 68m

## 2012-07-05 NOTE — Assessment & Plan Note (Signed)
Controlled on omez

## 2012-07-24 ENCOUNTER — Other Ambulatory Visit: Payer: Self-pay | Admitting: Internal Medicine

## 2012-08-14 ENCOUNTER — Encounter: Payer: Self-pay | Admitting: Internal Medicine

## 2012-08-14 ENCOUNTER — Ambulatory Visit (INDEPENDENT_AMBULATORY_CARE_PROVIDER_SITE_OTHER): Payer: Medicare Other | Admitting: Internal Medicine

## 2012-08-14 VITALS — BP 110/78 | HR 70 | Temp 97.1°F | Ht 63.0 in | Wt 148.0 lb

## 2012-08-14 DIAGNOSIS — E119 Type 2 diabetes mellitus without complications: Secondary | ICD-10-CM

## 2012-08-14 DIAGNOSIS — J45901 Unspecified asthma with (acute) exacerbation: Secondary | ICD-10-CM

## 2012-08-14 DIAGNOSIS — J209 Acute bronchitis, unspecified: Secondary | ICD-10-CM

## 2012-08-14 MED ORDER — PREDNISONE 10 MG PO TABS: ORAL_TABLET | ORAL | Status: DC

## 2012-08-14 MED ORDER — HYDROCODONE-HOMATROPINE 5-1.5 MG/5ML PO SYRP: 5.0000 mL | ORAL_SOLUTION | Freq: Four times a day (QID) | ORAL | Status: AC | PRN

## 2012-08-14 MED ORDER — LEVOFLOXACIN 250 MG PO TABS: 250.0000 mg | ORAL_TABLET | Freq: Every day | ORAL | Status: AC

## 2012-08-14 MED ORDER — METHYLPREDNISOLONE ACETATE 80 MG/ML IJ SUSP
120.0000 mg | Freq: Once | INTRAMUSCULAR | Status: AC
  Administered 2012-08-14: 120 mg via INTRAMUSCULAR

## 2012-08-14 MED ORDER — METHYLPREDNISOLONE ACETATE 80 MG/ML IJ SUSP: 120.0000 mg | Freq: Once | INTRAMUSCULAR | Status: DC

## 2012-08-14 NOTE — Progress Notes (Signed)
Subjective:    Patient ID: [name] [name], female    DOB: 10-25-1946, 66 y.o.   MRN: [id number]  HPI  Here with acute onset mild to mod 2-3 days ST, HA, general weakness and malaise, with prod cough greenish sputum, but Pt denies chest pain, increased sob or doe, wheezing, orthopnea, PND, increased LE swelling, palpitations, dizziness or syncope., except for onset wheezing last night with sob/doe mild.  Pt denies new neurological symptoms such as new headache, or facial or extremity weakness or numbness   Pt denies polydipsia, polyuria.  Pt denies fever, wt loss, night sweats, loss of appetite, or other constitutional symptoms except for the above Past Medical History  Diagnosis Date  . Psoriasis   . Eczema   . DIABETES MELLITUS 01/30/2008  . GOITER, MULTINODULAR 05/21/2010  . GLUCOSE INTOLERANCE 09/03/2007  . HYPERCHOLESTEROLEMIA 09/03/2007  . HYPERTENSION 09/03/2007  . CORONARY ARTERY DISEASE 09/03/2007  . ASTHMA 09/03/2007  . COPD 09/03/2007  . GERD 09/03/2007  . OBESITY 09/03/2007  . DEGENERATIVE JOINT DISEASE 01/30/2008  . ARTHRITIS 01/30/2008  . COLON CANCER 04/07/2008  . LVH (left ventricular hypertrophy)   . Osteopenia   . Impaired glucose tolerance 04/10/2011   Past Surgical History  Procedure Date  . Abdominal hysterectomy   . Ptca   . Hernia repair   . Tubal ligation   . Hemicolectomy     rt    reports that she quit smoking about 33 years ago. Her smoking use included Cigarettes. She has a 15 pack-year smoking history. She does not have any smokeless tobacco history on file. She reports that she does not drink alcohol or use illicit drugs. family history includes Cancer in her other; Colon cancer in her other; Liver disease in her father; and Thyroid disease in her brother and sister. Allergies  Allergen Reactions  . Simvastatin    Current Outpatient Prescriptions on File Prior to Visit  Medication Sig Dispense Refill  . acyclovir (ZOVIRAX) 5 % cream Apply 1 application  topically every 3 (three) hours.  15 g  2  . albuterol (PROVENTIL HFA;VENTOLIN HFA) 108 (90 BASE) MCG/ACT inhaler Inhale 2 puffs into the lungs every 6 (six) hours as needed. Please put in patient file until she is ready for refill thanks  1 Inhaler  6  . amLODipine (NORVASC) 5 MG tablet TAKE 1 TABLET EVERY DAY  90 tablet  2  . aspirin 325 MG EC tablet Take 325 mg by mouth daily.        . clobetasol (TEMOVATE) 0.05 % ointment Use twice once daily to hand and feet as needed  30 g  1  . desonide (DESOWEN) 0.05 % lotion Apply topically. Use under breast two times a day as needed       . fluocinolone (SYNALAR) 0.025 % ointment Apply topically 2 (two) times daily as needed.  30 g  1  . Fluticasone-Salmeterol (ADVAIR DISKUS) 250-50 MCG/DOSE AEPB Inhale 1 puff into the lungs 2 (two) times daily.  60 each  5  . FREESTYLE LITE test strip TEST ONCE DAILY AS DIRECTED  100 each  3  . hydrochlorothiazide (HYDRODIURIL) 25 MG tablet TAKE 1 TABLET EVERY DAY  31 tablet  11  . ketoconazole (NIZORAL) 2 % cream Apply topically. use as directed two times a day as needed under breast       . Lancets (FREESTYLE) lancets USE AS DIRECTED  100 each  3  . lovastatin (MEVACOR) 40 MG tablet TAKE 2 TABLETS  EVERY DAY  60 tablet  9  . metFORMIN (GLUCOPHAGE-XR) 500 MG 24 hr tablet TAKE 2 TABLETS EVERY DAY  180 tablet  1  . metoprolol tartrate (LOPRESSOR) 25 MG tablet 1 and 1/2 by mouth two times a day  30 tablet  11  . naproxen (NAPROSYN) 500 MG tablet TAKE 1 TABLET TWICE A DAY AS NEEDED  60 tablet  11  . omeprazole (PRILOSEC) 20 MG capsule TAKE ONE CAPSULE EVERY DAY  90 capsule  3  . traMADol (ULTRAM) 50 MG tablet 2 tablets daily      . triamcinolone (KENALOG) 0.1 % cream Apply topically 2 (two) times daily as needed.  30 g  1   No current facility-administered medications on file prior to visit.   Review of Systems Review of Systems  Constitutional: Negative for diaphoresis and unexpected weight change.  Eyes: Negative  for photophobia and visual disturbance.  Gastrointestinal: Negative for vomiting and blood in stool.  Genitourinary: Negative for hematuria and decreased urine volume.  Musculoskeletal: Negative for gait problem.  Skin: Negative for color change and wound.  Neurological: Negative for tremors and numbness.  Psychiatric/Behavioral: Negative for decreased concentration. The patient is not hyperactive.      Objective:   Physical Exam BP 110/78  Pulse 70  Temp 97.1 F (36.2 C) (Oral)  Ht 5\' 3"  (1.6 m)  Wt 148 lb (67.132 kg)  BMI 26.22 kg/m2  SpO2 95% Physical Exam  VS noted Constitutional: Pt appears well-developed and well-nourished.  HENT: Head: Normocephalic.  Right Ear: External ear normal.  Left Ear: External ear normal.  Bilat tm's mild erythema.  Sinus nontender.  Pharynx mild erythema Eyes: Conjunctivae and EOM are normal. Pupils are equal, round, and reactive to light.  Neck: Normal range of motion. Neck supple.  Cardiovascular: Normal rate and regular rhythm.   Pulmonary/Chest: Effort normal and breath sounds mild decreased, few wheeze bilat Neurological: Pt is alert. Not confused Skin: Skin is warm. No erythema. No rash Psychiatric: Pt behavior is normal. Thought content normal.     Assessment & Plan:

## 2012-08-14 NOTE — Patient Instructions (Addendum)
You had the steroid shot today Take all new medications as prescribed - the antibiotic and cough medicine, and the prednisone Continue all other medications as before Please return if not improved in 2-3 days, as we may need a chest xray or further treeatment

## 2012-08-14 NOTE — Assessment & Plan Note (Signed)
stable overall by hx and exam, most recent data reviewed with pt, and pt to continue medical treatment as before Lab Results  Component Value Date   HGBA1C 6.3 04/11/2012   To call for cbg > 200 on steroid tx

## 2012-08-14 NOTE — Assessment & Plan Note (Signed)
Mild to mod, for antibx course,  to f/u any worsening symptoms or concerns 

## 2012-08-14 NOTE — Assessment & Plan Note (Signed)
Mild to mod, for depomedrol IM and predpack asd,  to f/u any worsening symptoms or concerns 

## 2012-08-21 ENCOUNTER — Other Ambulatory Visit: Payer: Self-pay | Admitting: Internal Medicine

## 2012-09-19 ENCOUNTER — Other Ambulatory Visit: Payer: Self-pay | Admitting: Internal Medicine

## 2012-10-17 ENCOUNTER — Other Ambulatory Visit: Payer: Self-pay | Admitting: Adult Health

## 2012-10-18 ENCOUNTER — Other Ambulatory Visit (INDEPENDENT_AMBULATORY_CARE_PROVIDER_SITE_OTHER): Payer: Medicare Other

## 2012-10-18 DIAGNOSIS — E119 Type 2 diabetes mellitus without complications: Secondary | ICD-10-CM

## 2012-10-18 LAB — BASIC METABOLIC PANEL
BUN: 14 mg/dL (ref 6–23)
Chloride: 104 mEq/L (ref 96–112)
Creatinine, Ser: 0.7 mg/dL (ref 0.4–1.2)
Glucose, Bld: 96 mg/dL (ref 70–99)

## 2012-10-18 LAB — LIPID PANEL
Cholesterol: 143 mg/dL (ref 0–200)
LDL Cholesterol: 83 mg/dL (ref 0–99)
Triglycerides: 72 mg/dL (ref 0.0–149.0)

## 2012-10-18 LAB — HEMOGLOBIN A1C: Hgb A1c MFr Bld: 6.3 % (ref 4.6–6.5)

## 2012-10-22 ENCOUNTER — Encounter: Payer: Self-pay | Admitting: Internal Medicine

## 2012-10-22 ENCOUNTER — Ambulatory Visit (INDEPENDENT_AMBULATORY_CARE_PROVIDER_SITE_OTHER): Payer: Medicare Other | Admitting: Internal Medicine

## 2012-10-22 VITALS — BP 102/62 | HR 64 | Temp 98.2°F | Ht 63.0 in | Wt 147.0 lb

## 2012-10-22 DIAGNOSIS — Z0000 Encounter for general adult medical examination without abnormal findings: Secondary | ICD-10-CM

## 2012-10-22 DIAGNOSIS — E1149 Type 2 diabetes mellitus with other diabetic neurological complication: Secondary | ICD-10-CM

## 2012-10-22 DIAGNOSIS — I10 Essential (primary) hypertension: Secondary | ICD-10-CM

## 2012-10-22 DIAGNOSIS — E1142 Type 2 diabetes mellitus with diabetic polyneuropathy: Secondary | ICD-10-CM

## 2012-10-22 DIAGNOSIS — E7800 Pure hypercholesterolemia, unspecified: Secondary | ICD-10-CM

## 2012-10-22 DIAGNOSIS — E1140 Type 2 diabetes mellitus with diabetic neuropathy, unspecified: Secondary | ICD-10-CM

## 2012-10-22 MED ORDER — TRAMADOL HCL 50 MG PO TABS: 50.0000 mg | ORAL_TABLET | Freq: Four times a day (QID) | ORAL | Status: DC | PRN

## 2012-10-22 MED ORDER — AMLODIPINE BESYLATE 2.5 MG PO TABS: 2.5000 mg | ORAL_TABLET | Freq: Every day | ORAL | Status: DC

## 2012-10-22 NOTE — Patient Instructions (Addendum)
OK to decrease the amlodipine to 2.5 mg per day OK to increase the tramadol as prescribed Continue all other medications as before Please have the pharmacy call with any refills you may need. Please continue your efforts at being more active, low cholesterol diabetic diet, and weight control. Please return in 6 mo with Lab testing done 3-5 days before

## 2012-10-22 NOTE — Assessment & Plan Note (Signed)
stable overall by hx and exam, most recent data reviewed with pt, and pt to continue medical treatment as before Lab Results  Component Value Date   LDLCALC 83 10/18/2012

## 2012-10-22 NOTE — Progress Notes (Signed)
Subjective:    Patient ID: [name] [name], female    DOB: January 09, 1946, 66 y.o.   MRN: [id number]  HPI Here to f/u; overall doing ok,  Pt denies chest pain, increased sob or doe, wheezing, orthopnea, PND, increased LE swelling, palpitations, dizziness or syncope.  Pt denies new neurological symptoms such as new headache, or facial or extremity weakness or numbness   Pt denies polydipsia, polyuria, or low sugar symptoms such as weakness or confusion improved with po intake.  Pt states overall good compliance with meds, trying to follow lower cholesterol, diabetic diet, wt overall stable but little exercise however.  Did see podiatry who noted she has mild painful neuropathy, now only taking the tramadol once per day as the pain has lessened somewhat, worse at night after more walking during the day.  Trying to minimize the naproxen to lessen chance of side effect.  Gout mentioned to her as possible etiology of her foot pain, but not clear.  No other acute complaints.  Did have a slight dizzy episode last wk for a few seconds that made her sit down walking from one rm to the other, but no other assoc symptoms and no recurrence. Past Medical History  Diagnosis Date  . Psoriasis   . Eczema   . DIABETES MELLITUS 01/30/2008  . GOITER, MULTINODULAR 05/21/2010  . GLUCOSE INTOLERANCE 09/03/2007  . HYPERCHOLESTEROLEMIA 09/03/2007  . HYPERTENSION 09/03/2007  . CORONARY ARTERY DISEASE 09/03/2007  . ASTHMA 09/03/2007  . COPD 09/03/2007  . GERD 09/03/2007  . OBESITY 09/03/2007  . DEGENERATIVE JOINT DISEASE 01/30/2008  . ARTHRITIS 01/30/2008  . COLON CANCER 04/07/2008  . LVH (left ventricular hypertrophy)   . Osteopenia   . Impaired glucose tolerance 04/10/2011   Past Surgical History  Procedure Date  . Abdominal hysterectomy   . Ptca   . Hernia repair   . Tubal ligation   . Hemicolectomy     rt    reports that she quit smoking about 33 years ago. Her smoking use included Cigarettes. She has a 15 pack-year  smoking history. She does not have any smokeless tobacco history on file. She reports that she does not drink alcohol or use illicit drugs. family history includes Cancer in her other; Colon cancer in her other; Liver disease in her father; and Thyroid disease in her brother and sister. Allergies  Allergen Reactions  . Simvastatin    Current Outpatient Prescriptions on File Prior to Visit  Medication Sig Dispense Refill  . acyclovir (ZOVIRAX) 5 % cream Apply 1 application topically every 3 (three) hours.  15 g  2  . ADVAIR DISKUS 250-50 MCG/DOSE AEPB INHALE 1 PUFF INTO THE LUNGS 2 (TWO) TIMES DAILY.  60 each  5  . albuterol (PROVENTIL HFA;VENTOLIN HFA) 108 (90 BASE) MCG/ACT inhaler Inhale 2 puffs into the lungs every 6 (six) hours as needed. Please put in patient file until she is ready for refill thanks  1 Inhaler  6  . amLODipine (NORVASC) 5 MG tablet TAKE 1 TABLET EVERY DAY  90 tablet  3  . aspirin 325 MG EC tablet Take 325 mg by mouth daily.        . clobetasol (TEMOVATE) 0.05 % ointment Use twice once daily to hand and feet as needed  30 g  1  . desonide (DESOWEN) 0.05 % lotion Apply topically. Use under breast two times a day as needed       . fluocinolone (SYNALAR) 0.025 % ointment Apply topically 2 (two)  times daily as needed.  30 g  1  . FREESTYLE LITE test strip TEST ONCE DAILY AS DIRECTED  100 each  3  . hydrochlorothiazide (HYDRODIURIL) 25 MG tablet TAKE 1 TABLET EVERY DAY  31 tablet  11  . ketoconazole (NIZORAL) 2 % cream Apply topically. use as directed two times a day as needed under breast       . Lancets (FREESTYLE) lancets USE AS DIRECTED  100 each  3  . lovastatin (MEVACOR) 40 MG tablet TAKE 2 TABLETS EVERY DAY  60 tablet  9  . metFORMIN (GLUCOPHAGE-XR) 500 MG 24 hr tablet TAKE 2 TABLETS EVERY DAY  180 tablet  1  . metoprolol tartrate (LOPRESSOR) 25 MG tablet 1 and 1/2 by mouth two times a day  30 tablet  11  . naproxen (NAPROSYN) 500 MG tablet TAKE 1 TABLET TWICE A DAY AS  NEEDED  60 tablet  11  . omeprazole (PRILOSEC) 20 MG capsule TAKE ONE CAPSULE EVERY DAY  90 capsule  3  . predniSONE (DELTASONE) 10 MG tablet 3 tabs by mouth per day for 3 days,2tabs per day for 3 days,1tab per day for 3 days  18 tablet  0  . traMADol (ULTRAM) 50 MG tablet 2 tablets daily      . triamcinolone (KENALOG) 0.1 % cream Apply topically 2 (two) times daily as needed.  30 g  1   Current Facility-Administered Medications on File Prior to Visit  Medication Dose Route Frequency Provider Last Rate Last Dose  . methylPREDNISolone acetate (DEPO-MEDROL) injection 120 mg  120 mg Intramuscular Once Corwin Levins, MD       Review of Systems  Constitutional: Negative for diaphoresis and unexpected weight change.  HENT: Negative for tinnitus.   Eyes: Negative for photophobia and visual disturbance.  Respiratory: Negative for choking and stridor.   Gastrointestinal: Negative for vomiting and blood in stool.  Genitourinary: Negative for hematuria and decreased urine volume.  Musculoskeletal: Negative for gait problem.  Skin: Negative for color change and wound.  Neurological: Negative for tremors and numbness.  Psychiatric/Behavioral: Negative for decreased concentration. The patient is not hyperactive.       Objective:   Physical Exam BP 102/62  Pulse 64  Temp 98.2 F (36.8 C) (Oral)  Ht 5\' 3"  (1.6 m)  Wt 147 lb (66.679 kg)  BMI 26.04 kg/m2  SpO2 98% Physical Exam  VS noted Constitutional: Pt appears well-developed and well-nourished.  HENT: Head: Normocephalic.  Right Ear: External ear normal.  Left Ear: External ear normal.  Eyes: Conjunctivae and EOM are normal. Pupils are equal, round, and reactive to light.  Neck: Normal range of motion. Neck supple.  Cardiovascular: Normal rate and regular rhythm.   Pulmonary/Chest: Effort normal and breath sounds normal.  Neurological: Pt is alert. Not confused  Skin: Skin is warm. No erythema.  Psychiatric: Pt behavior is normal.  Thought content normal.     Assessment & Plan:

## 2012-10-22 NOTE — Assessment & Plan Note (Addendum)
stable overall by hx and exam, most recent data reviewed with pt, and pt to decrease the amlod to 2.5 mg given her recent grad wt loss and lower blood pressure, and recent dizzy spell BP Readings from Last 3 Encounters:  10/22/12 102/62  08/14/12 110/78  07/04/12 116/64

## 2012-10-22 NOTE — Assessment & Plan Note (Signed)
With pain, for tramadol refill, to minimize the naproxen, o/w stable overall by hx and exam, most recent data reviewed with pt, and pt to continue medical treatment as before Lab Results  Component Value Date   HGBA1C 6.3 10/18/2012

## 2012-10-23 ENCOUNTER — Other Ambulatory Visit: Payer: Self-pay | Admitting: Internal Medicine

## 2012-11-10 ENCOUNTER — Ambulatory Visit: Payer: Self-pay | Admitting: Family Medicine

## 2012-11-13 ENCOUNTER — Ambulatory Visit (INDEPENDENT_AMBULATORY_CARE_PROVIDER_SITE_OTHER): Payer: Medicare Other | Admitting: Internal Medicine

## 2012-11-13 ENCOUNTER — Encounter: Payer: Self-pay | Admitting: Internal Medicine

## 2012-11-13 VITALS — BP 114/70 | HR 86 | Temp 98.0°F | Ht 63.0 in | Wt 145.0 lb

## 2012-11-13 DIAGNOSIS — E1142 Type 2 diabetes mellitus with diabetic polyneuropathy: Secondary | ICD-10-CM

## 2012-11-13 DIAGNOSIS — E1149 Type 2 diabetes mellitus with other diabetic neurological complication: Secondary | ICD-10-CM

## 2012-11-13 DIAGNOSIS — J45901 Unspecified asthma with (acute) exacerbation: Secondary | ICD-10-CM

## 2012-11-13 DIAGNOSIS — J441 Chronic obstructive pulmonary disease with (acute) exacerbation: Secondary | ICD-10-CM | POA: Insufficient documentation

## 2012-11-13 DIAGNOSIS — J209 Acute bronchitis, unspecified: Secondary | ICD-10-CM

## 2012-11-13 DIAGNOSIS — E1140 Type 2 diabetes mellitus with diabetic neuropathy, unspecified: Secondary | ICD-10-CM

## 2012-11-13 MED ORDER — HYDROCODONE-HOMATROPINE 5-1.5 MG/5ML PO SYRP: 5.0000 mL | ORAL_SOLUTION | Freq: Four times a day (QID) | ORAL | Status: DC | PRN

## 2012-11-13 MED ORDER — LEVOFLOXACIN 250 MG PO TABS: 250.0000 mg | ORAL_TABLET | Freq: Every day | ORAL | Status: DC

## 2012-11-13 MED ORDER — METHYLPREDNISOLONE ACETATE 80 MG/ML IJ SUSP
120.0000 mg | Freq: Once | INTRAMUSCULAR | Status: AC
  Administered 2012-11-13: 120 mg via INTRAMUSCULAR

## 2012-11-13 MED ORDER — PREDNISONE 10 MG PO TABS: ORAL_TABLET | ORAL | Status: DC

## 2012-11-13 NOTE — Patient Instructions (Addendum)
You had the steroid shot today Take all new medications as prescribed Continue all other medications as before Please remember to sign up for My Chart at your earliest convenience, as this will be important to you in the future with finding out test results.

## 2012-11-13 NOTE — Assessment & Plan Note (Signed)
Mild to mod, for depomedrol IM, predpack asd, cough med,  to f/u any worsening symptoms or concerns

## 2012-11-13 NOTE — Assessment & Plan Note (Signed)
Mild to mod, for antibx course,  to f/u any worsening symptoms or concerns 

## 2012-11-13 NOTE — Progress Notes (Signed)
Subjective:    Patient ID: [name] [name], female    DOB: 01-Aug-1946, 66 y.o.   MRN: [id number]  HPI  Here with acute onset mild to mod 2-3 days ST, HA, general weakness and malaise, with prod cough greenish sputum, but Pt denies chest pain, increased sob or doe, orthopnea, PND, increased LE swelling, palpitations, dizziness or syncope, but with onset wheezing since last PM, mild.  Pt denies new neurological symptoms such as new headache, or facial or extremity weakness or numbness.   Pt denies polydipsia, polyuria, or low sugar symptoms such as weakness or confusion improved with po intake.  Pt states overall good compliance with meds, trying to follow lower cholesterol, diabetic diet. Past Medical History  Diagnosis Date  . Psoriasis   . Eczema   . DIABETES MELLITUS 01/30/2008  . GOITER, MULTINODULAR 05/21/2010  . GLUCOSE INTOLERANCE 09/03/2007  . HYPERCHOLESTEROLEMIA 09/03/2007  . HYPERTENSION 09/03/2007  . CORONARY ARTERY DISEASE 09/03/2007  . ASTHMA 09/03/2007  . COPD 09/03/2007  . GERD 09/03/2007  . OBESITY 09/03/2007  . DEGENERATIVE JOINT DISEASE 01/30/2008  . ARTHRITIS 01/30/2008  . COLON CANCER 04/07/2008  . LVH (left ventricular hypertrophy)   . Osteopenia   . Impaired glucose tolerance 04/10/2011   Past Surgical History  Procedure Date  . Abdominal hysterectomy   . Ptca   . Hernia repair   . Tubal ligation   . Hemicolectomy     rt    reports that she quit smoking about 33 years ago. Her smoking use included Cigarettes. She has a 15 pack-year smoking history. She does not have any smokeless tobacco history on file. She reports that she does not drink alcohol or use illicit drugs. family history includes Cancer in her other; Colon cancer in her other; Liver disease in her father; and Thyroid disease in her brother and sister. Allergies  Allergen Reactions  . Simvastatin    Current Outpatient Prescriptions on File Prior to Visit  Medication Sig Dispense Refill  . acyclovir  (ZOVIRAX) 5 % cream Apply 1 application topically every 3 (three) hours.  15 g  2  . ADVAIR DISKUS 250-50 MCG/DOSE AEPB INHALE 1 PUFF INTO THE LUNGS 2 (TWO) TIMES DAILY.  60 each  5  . albuterol (PROVENTIL HFA;VENTOLIN HFA) 108 (90 BASE) MCG/ACT inhaler Inhale 2 puffs into the lungs every 6 (six) hours as needed. Please put in patient file until she is ready for refill thanks  1 Inhaler  6  . amLODipine (NORVASC) 2.5 MG tablet Take 1 tablet (2.5 mg total) by mouth daily.  90 tablet  3  . aspirin 325 MG EC tablet Take 325 mg by mouth daily.        . clobetasol (TEMOVATE) 0.05 % ointment Use twice once daily to hand and feet as needed  30 g  1  . desonide (DESOWEN) 0.05 % lotion Apply topically. Use under breast two times a day as needed       . fluocinolone (SYNALAR) 0.025 % ointment Apply topically 2 (two) times daily as needed.  30 g  1  . FREESTYLE LITE test strip TEST ONCE DAILY AS DIRECTED  100 each  3  . hydrochlorothiazide (HYDRODIURIL) 25 MG tablet TAKE 1 TABLET EVERY DAY  31 tablet  5  . ketoconazole (NIZORAL) 2 % cream Apply topically. use as directed two times a day as needed under breast       . Lancets (FREESTYLE) lancets USE AS DIRECTED  100 each  3  .  lovastatin (MEVACOR) 40 MG tablet TAKE 2 TABLETS EVERY DAY  60 tablet  9  . metFORMIN (GLUCOPHAGE-XR) 500 MG 24 hr tablet TAKE 2 TABLETS EVERY DAY  180 tablet  1  . metoprolol tartrate (LOPRESSOR) 25 MG tablet 1 and 1/2 by mouth two times a day  30 tablet  11  . naproxen (NAPROSYN) 500 MG tablet TAKE 1 TABLET TWICE A DAY AS NEEDED  60 tablet  11  . omeprazole (PRILOSEC) 20 MG capsule TAKE ONE CAPSULE EVERY DAY  90 capsule  3  . traMADol (ULTRAM) 50 MG tablet Take 1 tablet (50 mg total) by mouth every 6 (six) hours as needed for pain. 2 tablets daily  120 tablet  5  . triamcinolone (KENALOG) 0.1 % cream Apply topically 2 (two) times daily as needed.  30 g  1   Current Facility-Administered Medications on File Prior to Visit  Medication  Dose Route Frequency Provider Last Rate Last Dose  . methylPREDNISolone acetate (DEPO-MEDROL) injection 120 mg  120 mg Intramuscular Once Corwin Levins, MD       Review of Systems  Constitutional: Negative for diaphoresis and unexpected weight change.  HENT: Negative for tinnitus.   Eyes: Negative for photophobia and visual disturbance.  Respiratory: Negative for choking and stridor.   Gastrointestinal: Negative for vomiting and blood in stool.  Genitourinary: Negative for hematuria and decreased urine volume.  Musculoskeletal: Negative for gait problem.  Skin: Negative for color change and wound.  Neurological: Negative for tremors and numbness.  Psychiatric/Behavioral: Negative for decreased concentration. The patient is not hyperactive.       Objective:   Physical Exam BP 114/70  Pulse 86  Temp 98 F (36.7 C) (Oral)  Ht 5\' 3"  (1.6 m)  Wt 145 lb (65.772 kg)  BMI 25.69 kg/m2  SpO2 96% Physical Exam  VS noted, mild ill Constitutional: Pt appears well-developed and well-nourished.  HENT: Head: Normocephalic.  Right Ear: External ear normal.  Left Ear: External ear normal.  Bilat tm's mild erythema.  Sinus nontender.  Pharynx mild erythema Eyes: Conjunctivae and EOM are normal. Pupils are equal, round, and reactive to light.  Neck: Normal range of motion. Neck supple.  Cardiovascular: Normal rate and regular rhythm.   Pulmonary/Chest: Effort normal and breath sounds decreased with bilat wheeze.  Abd:  Soft, NT, non-distended, + BS Neurological: Pt is alert. Not confused  Skin: Skin is warm. No erythema.  Psychiatric: Pt behavior is normal. Thought content normal.     Assessment & Plan:

## 2012-11-13 NOTE — Assessment & Plan Note (Signed)
stable overall by hx and exam, most recent data reviewed with pt, and pt to continue medical treatment as before Lab Results  Component Value Date   HGBA1C 6.3 10/18/2012

## 2012-12-13 ENCOUNTER — Other Ambulatory Visit: Payer: Self-pay | Admitting: Internal Medicine

## 2012-12-14 ENCOUNTER — Ambulatory Visit (INDEPENDENT_AMBULATORY_CARE_PROVIDER_SITE_OTHER): Payer: Medicare Other | Admitting: Pulmonary Disease

## 2012-12-14 ENCOUNTER — Encounter: Payer: Self-pay | Admitting: Pulmonary Disease

## 2012-12-14 VITALS — BP 130/88 | HR 76 | Temp 97.7°F | Ht 63.0 in | Wt 148.0 lb

## 2012-12-14 DIAGNOSIS — J45909 Unspecified asthma, uncomplicated: Secondary | ICD-10-CM

## 2012-12-14 NOTE — Assessment & Plan Note (Signed)
Lung function appears good today Trigger-allergies - discussed better control of allergies with claritin during spring/ fall, may add nasal steroid if more flares. Stay on advair twice daily Rescue albuterol MDI 2 puffs as needed only Call us if you have episodes of bronchitis

## 2012-12-14 NOTE — Progress Notes (Signed)
  Subjective:    Patient ID: [name] [name], female    DOB: May 15, 1946, 67 y.o.   MRN: [id number]  HPI 67 yo AAF, ex smoker with asthmatic bronchitis, GERD and a component of VCD.  Worse during spring, fall & extreme heat. On xopenex MDI due to after taste & palpitations with albuterol  9/09--required 2 rounds of Abx & prednisone to resolve congestion  4/10 & 11/10-required prednisone dosepak  06/30/2011 Had cut down advair 100/50 to once A day  12/26/2011 increased advair to 250/50    12/14/2012 51m FU She tries to skip doses of advair (since expensive) Bronchitic flares in 9/13 & 12/13 requiring levaquin & prednisone breathing is okay now. at times she does cough, wheezing and chest is tx. she has certain triggers that causes this.  can not afford rescue inhaler - so did not fill Rx Denies nocturnal wheeze No reflux on omeprazole  Spirometry >> FEv1 84%   Review of Systems neg for any significant sore throat, dysphagia, itching, sneezing, nasal congestion or excess/ purulent secretions, fever, chills, sweats, unintended wt loss, pleuritic or exertional cp, hempoptysis, orthopnea pnd or change in chronic leg swelling. Also denies presyncope, palpitations, heartburn, abdominal pain, nausea, vomiting, diarrhea or change in bowel or urinary habits, dysuria,hematuria, rash, arthralgias, visual complaints, headache, numbness weakness or ataxia.     Objective:   Physical Exam  Gen. Pleasant, well-nourished, in no distress ENT - no lesions, no post nasal drip Neck: No JVD, no thyromegaly, no carotid bruits Lungs: no use of accessory muscles, no dullness to percussion, clear without rales or rhonchi  Cardiovascular: Rhythm regular, heart sounds  normal, no murmurs or gallops, no peripheral edema Musculoskeletal: No deformities, no cyanosis or clubbing         Assessment & Plan:

## 2012-12-14 NOTE — Patient Instructions (Addendum)
Lung function appears good Stay on advair twice daily Rescue albuterol MDI 2 puffs as needed only Call us if you have episodes of bronchitis

## 2012-12-18 ENCOUNTER — Other Ambulatory Visit: Payer: Self-pay | Admitting: Internal Medicine

## 2013-01-21 ENCOUNTER — Encounter: Payer: Self-pay | Admitting: Internal Medicine

## 2013-01-28 ENCOUNTER — Encounter: Payer: Self-pay | Admitting: Internal Medicine

## 2013-02-18 ENCOUNTER — Ambulatory Visit (INDEPENDENT_AMBULATORY_CARE_PROVIDER_SITE_OTHER): Payer: Medicare Other | Admitting: Internal Medicine

## 2013-02-18 ENCOUNTER — Encounter: Payer: Self-pay | Admitting: Internal Medicine

## 2013-02-18 VITALS — BP 122/78 | HR 89 | Temp 97.7°F | Ht 63.0 in | Wt 148.0 lb

## 2013-02-18 DIAGNOSIS — J209 Acute bronchitis, unspecified: Secondary | ICD-10-CM

## 2013-02-18 MED ORDER — PREDNISONE 10 MG PO TABS: ORAL_TABLET | ORAL | Status: DC

## 2013-02-18 MED ORDER — HYDROCODONE-HOMATROPINE 5-1.5 MG/5ML PO SYRP: 5.0000 mL | ORAL_SOLUTION | Freq: Three times a day (TID) | ORAL | Status: DC | PRN

## 2013-02-18 MED ORDER — LEVOFLOXACIN 500 MG PO TABS: 500.0000 mg | ORAL_TABLET | Freq: Every day | ORAL | Status: DC

## 2013-02-18 MED ORDER — METHYLPREDNISOLONE ACETATE 80 MG/ML IJ SUSP
80.0000 mg | Freq: Once | INTRAMUSCULAR | Status: AC
  Administered 2013-02-18: 80 mg via INTRAMUSCULAR

## 2013-02-18 NOTE — Progress Notes (Signed)
HPI  Pt presents to the clinic today with c/o cold symptoms x 2 weeks. The worst part is the productive cough. She was running fevers last week but none this week. She has tried Robitussin, Mucinex, cough drops and nothing seems to help. The cough is worse at night. She has not had much sleep in 3 nights. She does seem to get this every 3 months or so. She does have COPD. She does not smoke. She has had sick contacts.  Review of Systems      Past Medical History  Diagnosis Date  . Psoriasis   . Eczema   . DIABETES MELLITUS 01/30/2008  . GOITER, MULTINODULAR 05/21/2010  . GLUCOSE INTOLERANCE 09/03/2007  . HYPERCHOLESTEROLEMIA 09/03/2007  . HYPERTENSION 09/03/2007  . CORONARY ARTERY DISEASE 09/03/2007  . ASTHMA 09/03/2007  . COPD 09/03/2007  . GERD 09/03/2007  . OBESITY 09/03/2007  . DEGENERATIVE JOINT DISEASE 01/30/2008  . ARTHRITIS 01/30/2008  . COLON CANCER 04/07/2008  . LVH (left ventricular hypertrophy)   . Osteopenia   . Impaired glucose tolerance 04/10/2011    Family History  Problem Relation Age of Onset  . Liver disease Father   . Thyroid disease Sister     overactive  . Thyroid disease Brother     overactive  . Colon cancer Other     aunt  . Cancer Other     Colon Cancer-Aunt    History   Social History  . Marital Status: Married    Spouse Name: N/A    Number of Children: 4  . Years of Education: N/A   Occupational History  . stay at home    Social History Main Topics  . Smoking status: Former Smoker -- 1.00 packs/day for 15 years    Types: Cigarettes    Quit date: 12/12/1978  . Smokeless tobacco: Not on file  . Alcohol Use: No  . Drug Use: No  . Sexually Active: Not on file   Other Topics Concern  . Not on file   Social History Narrative   1 child died with meningitis    Allergies  Allergen Reactions  . Simvastatin      Constitutional: Positive headache, fatigue and fever. Denies abrupt weight changes.  HEENT:  Positive sore throat. Denies eye  redness, eye pain, pressure behind the eyes, facial pain, nasal congestion, ear pain, ringing in the ears, wax buildup, runny nose or bloody nose. Respiratory: Positive cough with thick green sputum production. Denies difficulty breathing or shortness of breath.  Cardiovascular: Denies chest pain, chest tightness, palpitations or swelling in the hands or feet.   No other specific complaints in a complete review of systems (except as listed in HPI above).  Objective:   BP 122/78  Pulse 89  Temp(Src) 97.7 F (36.5 C) (Oral)  Ht 5\' 3"  (1.6 m)  Wt 148 lb (67.132 kg)  BMI 26.22 kg/m2  SpO2 95% Wt Readings from Last 3 Encounters:  02/18/13 148 lb (67.132 kg)  12/14/12 148 lb (67.132 kg)  11/13/12 145 lb (65.772 kg)     General: Appears her stated age, well developed, well nourished in NAD. HEENT: Head: normal shape and size; Eyes: sclera white, no icterus, conjunctiva pink, PERRLA and EOMs intact; Ears: Tm's gray and intact, normal light reflex; Nose: mucosa pink and moist, septum midline; Throat/Mouth: + PND. Teeth present, mucosa erythematous and moist, no exudate noted, no lesions or ulcerations noted.  Neck: Mild cervical lymphadenopathy. Neck supple, trachea midline. No massses, lumps or thyromegaly  present.  Cardiovascular: Normal rate and rhythm. S1,S2 noted.  No murmur, rubs or gallops noted. No JVD or BLE edema. No carotid bruits noted. Pulmonary/Chest: Normal effort and scattered rhonchi and wheezing throughout. No respiratory distress.     Assessment & Plan:   Acute bronchitis, new onset:  Get some rest and drink plenty of water eRx for Levaquin x 5 days eRx for Hycodan cough syrup eRx for prednisone taper 80 mg Depo IM today  RTC as needed or if symptoms persist.

## 2013-02-18 NOTE — Patient Instructions (Signed)

## 2013-02-18 NOTE — Addendum Note (Signed)
Addended by: Carin Primrose on: 02/18/2013 02:18 PM   Modules accepted: Orders

## 2013-03-05 ENCOUNTER — Ambulatory Visit (AMBULATORY_SURGERY_CENTER): Payer: Medicare Other

## 2013-03-05 ENCOUNTER — Encounter: Payer: Self-pay | Admitting: Internal Medicine

## 2013-03-05 VITALS — Ht 63.0 in | Wt 153.4 lb

## 2013-03-05 DIAGNOSIS — Z85038 Personal history of other malignant neoplasm of large intestine: Secondary | ICD-10-CM

## 2013-03-05 DIAGNOSIS — Z800 Family history of malignant neoplasm of digestive organs: Secondary | ICD-10-CM

## 2013-03-05 DIAGNOSIS — Z1211 Encounter for screening for malignant neoplasm of colon: Secondary | ICD-10-CM

## 2013-03-05 DIAGNOSIS — Z83719 Family history of colon polyps, unspecified: Secondary | ICD-10-CM

## 2013-03-05 DIAGNOSIS — Z8371 Family history of colonic polyps: Secondary | ICD-10-CM

## 2013-03-05 MED ORDER — MOVIPREP 100 G PO SOLR: ORAL | Status: DC

## 2013-03-18 ENCOUNTER — Other Ambulatory Visit: Payer: Self-pay | Admitting: Internal Medicine

## 2013-03-19 ENCOUNTER — Encounter: Payer: Self-pay | Admitting: Internal Medicine

## 2013-03-19 ENCOUNTER — Ambulatory Visit (AMBULATORY_SURGERY_CENTER): Payer: Medicare Other | Admitting: Internal Medicine

## 2013-03-19 ENCOUNTER — Other Ambulatory Visit: Payer: Self-pay | Admitting: Internal Medicine

## 2013-03-19 VITALS — BP 112/65 | HR 63 | Temp 96.7°F | Resp 20 | Ht 63.0 in | Wt 153.0 lb

## 2013-03-19 DIAGNOSIS — Z1211 Encounter for screening for malignant neoplasm of colon: Secondary | ICD-10-CM

## 2013-03-19 DIAGNOSIS — Z85038 Personal history of other malignant neoplasm of large intestine: Secondary | ICD-10-CM

## 2013-03-19 DIAGNOSIS — Z83719 Family history of colon polyps, unspecified: Secondary | ICD-10-CM

## 2013-03-19 DIAGNOSIS — Z8371 Family history of colonic polyps: Secondary | ICD-10-CM

## 2013-03-19 LAB — GLUCOSE, CAPILLARY
Glucose-Capillary: 102 mg/dL — ABNORMAL HIGH (ref 70–99)
Glucose-Capillary: 113 mg/dL — ABNORMAL HIGH (ref 70–99)

## 2013-03-19 MED ORDER — SODIUM CHLORIDE 0.9 % IV SOLN: 500.0000 mL | INTRAVENOUS | Status: DC

## 2013-03-19 NOTE — Op Note (Signed)
[facility] [address] [place], [address]   COLONOSCOPY PROCEDURE REPORT  PATIENT: [name] [name], [name] [name]  MR#: [id number] BIRTHDATE: January 28, 1946 , 66  yrs. old GENDER: Female ENDOSCOPIST: Roxy Cedar, MD REFERRED [id number] Program Recall PROCEDURE DATE:  03/19/2013 PROCEDURE:   Colonoscopy, surveillance ASA CLASS:   Class II INDICATIONS:High risk patient with personal history of colon cancer. status post right hemicolectomy 1987. Last colonoscopy January 2009 (negative) MEDICATIONS: MAC sedation, administered by CRNA and propofol (Diprivan) 150mg  IV  DESCRIPTION OF PROCEDURE:   After the risks benefits and alternatives of the procedure were thoroughly explained, informed consent was obtained.  A digital rectal exam revealed no abnormalities of the rectum.   The LB [id number]  endoscope was introduced through the anus and advanced to the surgical anastomosis. No adverse events experienced.   The quality of the prep was excellent, using MoviPrep  The instrument was then slowly withdrawn as the colon was fully examined.      COLON FINDINGS: There was evidence of a prior ileocolonic surgical anastomosis and right hemicolectomy.   The colon mucosa was otherwise normal  throughout the remaining colon. No polyps or cancer.  Retroflexed views revealed internal hemorrhoids. The time to cecum=1 minutes 51 seconds.  Withdrawal time=5 minutes 06 seconds.  The scope was withdrawn and the procedure completed. COMPLICATIONS: There were no complications.  ENDOSCOPIC IMPRESSION: 1.   There was evidence of a prior ileocolonic surgical anastomosis and right hemicolectomy 2.   The colon mucosa was otherwise normal  without polyps or cancer  RECOMMENDATIONS: 1.Follow up colonoscopy in 5 years   eSigned:  Roxy Cedar, MD 03/19/2013 10:23 AM   cc: Corwin Levins, MD and The Patient

## 2013-03-19 NOTE — Patient Instructions (Addendum)
No polyps or cancers today.  Follow up colonoscopy in 5 years.  You may resume your current medications today.  Please call if any questions or concerns.    YOU HAD AN ENDOSCOPIC PROCEDURE TODAY AT THE [facility]: Refer to the procedure report that was given to you for any specific questions about what was found during the examination.  If the procedure report does not answer your questions, please call your gastroenterologist to clarify.  If you requested that your care partner not be given the details of your procedure findings, then the procedure report has been included in a sealed envelope for you to review at your convenience later.  YOU SHOULD EXPECT: Some feelings of bloating in the abdomen. Passage of more gas than usual.  Walking can help get rid of the air that was put into your GI tract during the procedure and reduce the bloating. If you had a lower endoscopy (such as a colonoscopy or flexible sigmoidoscopy) you may notice spotting of blood in your stool or on the toilet paper. If you underwent a bowel prep for your procedure, then you may not have a normal bowel movement for a few days.  DIET: Your first meal following the procedure should be a light meal and then it is ok to progress to your normal diet.  A half-sandwich or bowl of soup is an example of a good first meal.  Heavy or fried foods are harder to digest and may make you feel nauseous or bloated.  Likewise meals heavy in dairy and vegetables can cause extra gas to form and this can also increase the bloating.  Drink plenty of fluids but you should avoid alcoholic beverages for 24 hours.  ACTIVITY: Your care partner should take you home directly after the procedure.  You should plan to take it easy, moving slowly for the rest of the day.  You can resume normal activity the day after the procedure however you should NOT DRIVE or use heavy machinery for 24 hours (because of the sedation medicines used during the test).     SYMPTOMS TO REPORT IMMEDIATELY: A gastroenterologist can be reached at any hour.  During normal business hours, 8:30 AM to 5:00 PM Monday through Friday, call [phone].  After hours and on weekends, please call the GI answering service at [phone] who will take a message and have the physician on call contact you.   Following lower endoscopy (colonoscopy or flexible sigmoidoscopy):  Excessive amounts of blood in the stool  Significant tenderness or worsening of abdominal pains  Swelling of the abdomen that is new, acute  Fever of 100F or higher   FOLLOW UP: If any biopsies were taken you will be contacted by phone or by letter within the next 1-3 weeks.  Call your gastroenterologist if you have not heard about the biopsies in 3 weeks.  Our staff will call the home number listed on your records the next business day following your procedure to check on you and address any questions or concerns that you may have at that time regarding the information given to you following your procedure. This is a courtesy call and so if there is no answer at the home number and we have not heard from you through the emergency physician on call, we will assume that you have returned to your regular daily activities without incident.  SIGNATURES/CONFIDENTIALITY: You and/or your care partner have signed paperwork which will be entered into your electronic medical  record.  These signatures attest to the fact that that the information above on your After Visit Summary has been reviewed and is understood.  Full responsibility of the confidentiality of this discharge information lies with you and/or your care-partner. 

## 2013-03-19 NOTE — Progress Notes (Signed)
Patient did not experience any of the following events: a burn prior to discharge; a fall within the facility; wrong site/side/patient/procedure/implant event; or a hospital transfer or hospital admission upon discharge from the facility. (G8907) Patient did not have preoperative order for IV antibiotic SSI prophylaxis. (G8918)  

## 2013-03-19 NOTE — Progress Notes (Signed)
No complaints noted in the recovery room. Maw   

## 2013-03-20 ENCOUNTER — Telehealth: Payer: Self-pay | Admitting: *Deleted

## 2013-03-20 NOTE — Telephone Encounter (Signed)
  Follow up Call-  Call back number 03/19/2013  Post procedure Call Back phone  # [phone]  Permission to leave phone message Yes     Patient questions:  Do you have a fever, pain , or abdominal swelling? no Pain Score  0 *  Have you tolerated food without any problems? yes  Have you been able to return to your normal activities? yes  Do you have any questions about your discharge instructions: Diet   no Medications  no Follow up visit  no  Do you have questions or concerns about your Care? no  Actions: * If pain score is 4 or above: No action needed, pain <4.

## 2013-04-01 ENCOUNTER — Encounter: Payer: Self-pay | Admitting: *Deleted

## 2013-04-03 ENCOUNTER — Encounter: Payer: Self-pay | Admitting: Endocrinology

## 2013-04-03 ENCOUNTER — Ambulatory Visit (INDEPENDENT_AMBULATORY_CARE_PROVIDER_SITE_OTHER): Payer: Medicare Other | Admitting: Endocrinology

## 2013-04-03 VITALS — BP 126/80 | HR 72 | Wt 151.0 lb

## 2013-04-03 DIAGNOSIS — E042 Nontoxic multinodular goiter: Secondary | ICD-10-CM

## 2013-04-03 NOTE — Patient Instructions (Addendum)
Let's recheck the ultrasound.  you will receive a phone call, about a day and time for an appointment. Please also do the blood test soon with dr Jonny Ruiz.   most of the time, a "lumpy thyroid" will eventually become overactive.  this is usually a slow process, happening over the span of many years. Please return in 1 year.

## 2013-04-03 NOTE — Progress Notes (Signed)
Subjective:    Patient ID: [name] [name], female    DOB: 1946/07/17, 67 y.o.   MRN: [id number]  HPI Pt returns for f/u of a small multinodular goiter (dx'ed 2011; nodules have not met criteria for bx; f/u ultrasound in 2012 was unchanged).  She says she does not notice the goiter.  pt states she feels well in general.   Past Medical History  Diagnosis Date  . Psoriasis   . Eczema   . DIABETES MELLITUS 01/30/2008  . GOITER, MULTINODULAR 05/21/2010  . GLUCOSE INTOLERANCE 09/03/2007  . HYPERCHOLESTEROLEMIA 09/03/2007  . HYPERTENSION 09/03/2007  . CORONARY ARTERY DISEASE 09/03/2007  . ASTHMA 09/03/2007  . COPD 09/03/2007  . GERD 09/03/2007  . OBESITY 09/03/2007  . DEGENERATIVE JOINT DISEASE 01/30/2008  . ARTHRITIS 01/30/2008  . COLON CANCER 04/07/2008    pt states she had colon cancer 1987  . LVH (left ventricular hypertrophy)   . Osteopenia   . Impaired glucose tolerance 04/10/2011    Past Surgical History  Procedure Laterality Date  . Abdominal hysterectomy    . Ptca    . Hernia repair    . Tubal ligation    . Hemicolectomy  12/1985    rt    History   Social History  . Marital Status: Married    Spouse Name: N/A    Number of Children: 4  . Years of Education: N/A   Occupational History  . stay at home    Social History Main Topics  . Smoking status: Former Smoker -- 1.00 packs/day for 15 years    Types: Cigarettes    Quit date: 12/12/1978  . Smokeless tobacco: Never Used  . Alcohol Use: No  . Drug Use: No  . Sexually Active: Not on file   Other Topics Concern  . Not on file   Social History Narrative   1 child died with meningitis    Current Outpatient Prescriptions on File Prior to Visit  Medication Sig Dispense Refill  . acyclovir cream (ZOVIRAX) 5 % Apply 1 application topically as needed.      Marland Kitchen ADVAIR DISKUS 250-50 MCG/DOSE AEPB INHALE 1 PUFF INTO THE LUNGS 2 (TWO) TIMES DAILY.  60 each  5  . albuterol (PROVENTIL HFA;VENTOLIN HFA) 108 (90 BASE) MCG/ACT  inhaler Inhale 2 puffs into the lungs every 6 (six) hours as needed. Please put in patient file until she is ready for refill thanks  1 Inhaler  6  . amLODipine (NORVASC) 2.5 MG tablet Take 1 tablet (2.5 mg total) by mouth daily.  90 tablet  3  . aspirin 325 MG EC tablet Take 325 mg by mouth daily.        . clobetasol (TEMOVATE) 0.05 % ointment Use twice once daily to hand and feet as needed  30 g  1  . desonide (DESOWEN) 0.05 % lotion Apply topically. Use under breast two times a day as needed       . fluocinolone (SYNALAR) 0.025 % ointment Apply topically 2 (two) times daily as needed.  30 g  1  . FREESTYLE LITE test strip TEST ONCE DAILY AS DIRECTED  100 each  3  . hydrochlorothiazide (HYDRODIURIL) 25 MG tablet TAKE 1 TABLET EVERY DAY  31 tablet  5  . HYDROcodone-homatropine (HYCODAN) 5-1.5 MG/5ML syrup Take 5 mLs by mouth every 8 (eight) hours as needed for cough.  120 mL  0  . ketoconazole (NIZORAL) 2 % cream Apply topically. use as directed two times a day  as needed under breast       . Lancets (FREESTYLE) lancets USE AS DIRECTED  100 each  3  . lovastatin (MEVACOR) 40 MG tablet TAKE 2 TABLETS EVERY DAY  60 tablet  9  . metFORMIN (GLUCOPHAGE-XR) 500 MG 24 hr tablet TAKE 2 TABLETS EVERY DAY  180 tablet  1  . metoprolol tartrate (LOPRESSOR) 25 MG tablet TAKE 1 & 1/2 TABLETS TWICE A DAY  90 tablet  11  . naproxen (NAPROSYN) 500 MG tablet TAKE 1 TABLET TWICE A DAY AS NEEDED  60 tablet  11  . omeprazole (PRILOSEC) 20 MG capsule TAKE ONE CAPSULE EVERY DAY  90 capsule  3  . predniSONE (DELTASONE) 10 MG tablet Take 3 tabs for days 1-3, take 2 tabs on days 4-6, take 1 tab on days 7-9  18 tablet  0  . traMADol (ULTRAM) 50 MG tablet Take 1 tablet (50 mg total) by mouth every 6 (six) hours as needed for pain. 2 tablets daily  120 tablet  5  . triamcinolone cream (KENALOG) 0.1 % APPLY TO AFFECTED AREA TWICE A DAY AS NEEDED  30 g  1   Current Facility-Administered Medications on File Prior to Visit   Medication Dose Route Frequency Provider Last Rate Last Dose  . methylPREDNISolone acetate (DEPO-MEDROL) injection 120 mg  120 mg Intramuscular Once Corwin Levins, MD        Allergies  Allergen Reactions  . Simvastatin     Family History  Problem Relation Age of Onset  . Liver disease Father   . Thyroid disease Sister     overactive  . Thyroid disease Brother     overactive  . Colon cancer Other     aunt  . Cancer Other     Colon Cancer-Aunt  . Heart disease Mother   . Colon cancer Paternal Aunt     BP 126/80  Pulse 72  Wt 151 lb (68.493 kg)  BMI 26.76 kg/m2  SpO2 96%  Review of Systems Denies weight change    Objective:   Physical Exam VITAL SIGNS:  See vs page GENERAL: no distress NECK: There is no palpable thyroid enlargement.  No thyroid nodule is palpable.  No palpable lymphadenopathy at the anterior neck.  Lab Results  Component Value Date   TSH 0.43 04/11/2012      Assessment & Plan:  Multinodular goiter, due for recheck

## 2013-04-05 ENCOUNTER — Ambulatory Visit
Admission: RE | Admit: 2013-04-05 | Discharge: 2013-04-05 | Disposition: A | Discharge status: Home / Self Care | Payer: Medicare Other | Source: Ambulatory Visit | Attending: Endocrinology | Admitting: Endocrinology

## 2013-04-05 DIAGNOSIS — E042 Nontoxic multinodular goiter: Secondary | ICD-10-CM

## 2013-04-15 ENCOUNTER — Other Ambulatory Visit (INDEPENDENT_AMBULATORY_CARE_PROVIDER_SITE_OTHER): Payer: Medicare Other

## 2013-04-15 DIAGNOSIS — Z0000 Encounter for general adult medical examination without abnormal findings: Secondary | ICD-10-CM

## 2013-04-15 DIAGNOSIS — E1140 Type 2 diabetes mellitus with diabetic neuropathy, unspecified: Secondary | ICD-10-CM

## 2013-04-15 DIAGNOSIS — E1142 Type 2 diabetes mellitus with diabetic polyneuropathy: Secondary | ICD-10-CM

## 2013-04-15 DIAGNOSIS — E1149 Type 2 diabetes mellitus with other diabetic neurological complication: Secondary | ICD-10-CM

## 2013-04-15 LAB — URINALYSIS, ROUTINE W REFLEX MICROSCOPIC
Bilirubin Urine: NEGATIVE
Ketones, ur: NEGATIVE
Specific Gravity, Urine: 1.025 (ref 1.000–1.030)
Urine Glucose: NEGATIVE
pH: 6 (ref 5.0–8.0)

## 2013-04-15 LAB — MICROALBUMIN / CREATININE URINE RATIO
Creatinine,U: 111 mg/dL
Microalb, Ur: 1.6 mg/dL (ref 0.0–1.9)

## 2013-04-15 LAB — BASIC METABOLIC PANEL
Calcium: 8.9 mg/dL (ref 8.4–10.5)
GFR: 98.94 mL/min (ref >60.00)
Glucose, Bld: 86 mg/dL (ref 70–99)
Potassium: 4.1 mEq/L (ref 3.5–5.1)
Sodium: 140 mEq/L (ref 135–145)

## 2013-04-15 LAB — CBC WITH DIFFERENTIAL/PLATELET
Eosinophils Relative: 3.9 % (ref 0.0–5.0)
HCT: 39.9 % (ref 36.0–46.0)
Hemoglobin: 13.1 g/dL (ref 12.0–15.0)
Lymphs Abs: 2.4 10*3/uL (ref 0.7–4.0)
MCV: 80.8 fl (ref 78.0–100.0)
Monocytes Absolute: 0.6 10*3/uL (ref 0.1–1.0)
Monocytes Relative: 7 % (ref 3.0–12.0)
Neutro Abs: 5.2 10*3/uL (ref 1.4–7.7)
RDW: 16.8 % — ABNORMAL HIGH (ref 11.5–14.6)
WBC: 8.6 10*3/uL (ref 4.5–10.5)

## 2013-04-15 LAB — HEPATIC FUNCTION PANEL
AST: 22 U/L (ref 0–37)
Albumin: 3.7 g/dL (ref 3.5–5.2)
Alkaline Phosphatase: 58 U/L (ref 39–117)
Total Bilirubin: 0.9 mg/dL (ref 0.3–1.2)

## 2013-04-15 LAB — LIPID PANEL
HDL: 55.6 mg/dL (ref >39.00)
LDL Cholesterol: 82 mg/dL (ref 0–99)
Total CHOL/HDL Ratio: 3
VLDL: 12.6 mg/dL (ref 0.0–40.0)

## 2013-04-15 LAB — HEMOGLOBIN A1C: Hgb A1c MFr Bld: 6.5 % (ref 4.6–6.5)

## 2013-04-22 ENCOUNTER — Ambulatory Visit (INDEPENDENT_AMBULATORY_CARE_PROVIDER_SITE_OTHER): Payer: Medicare Other | Admitting: Internal Medicine

## 2013-04-22 ENCOUNTER — Encounter: Payer: Self-pay | Admitting: Internal Medicine

## 2013-04-22 VITALS — BP 102/72 | HR 64 | Temp 98.9°F | Wt 150.5 lb

## 2013-04-22 DIAGNOSIS — J449 Chronic obstructive pulmonary disease, unspecified: Secondary | ICD-10-CM

## 2013-04-22 DIAGNOSIS — E1142 Type 2 diabetes mellitus with diabetic polyneuropathy: Secondary | ICD-10-CM

## 2013-04-22 DIAGNOSIS — Z0000 Encounter for general adult medical examination without abnormal findings: Secondary | ICD-10-CM

## 2013-04-22 HISTORY — DX: Chronic obstructive pulmonary disease, unspecified: J44.9

## 2013-04-22 HISTORY — DX: Type 2 diabetes mellitus with diabetic polyneuropathy: E11.42

## 2013-04-22 MED ORDER — HYDROCHLOROTHIAZIDE 25 MG PO TABS: ORAL_TABLET | ORAL | Status: DC

## 2013-04-22 MED ORDER — KETOCONAZOLE 2 % EX CREA: TOPICAL_CREAM | Freq: Three times a day (TID) | CUTANEOUS | Status: DC

## 2013-04-22 MED ORDER — AMLODIPINE BESYLATE 5 MG PO TABS: 5.0000 mg | ORAL_TABLET | Freq: Every day | ORAL | Status: DC

## 2013-04-22 NOTE — Assessment & Plan Note (Signed)

## 2013-04-22 NOTE — Patient Instructions (Addendum)
Your blood work was given to you from last wk, all was [facility] OK to increase the amlodipine back to the 5 mg Please remember to followup with your GYN for the yearly pap smear and/or mammogram Please continue all other medications as before, and refills have been done if requested. Please have the pharmacy call with any other refills you may need. Please continue your efforts at being more active, low cholesterol diet, and weight control. You are otherwise up to date with prevention measures today. Please remember to sign up for My Chart if you have not done so, as this will be important to you in the future with finding out test results, communicating by private email, and scheduling acute appointments online when needed. Please return in 6 months, or sooner if needed

## 2013-04-22 NOTE — Assessment & Plan Note (Signed)
stable overall by history and exam, recent data reviewed with pt, and pt to continue medical treatment as before,  to f/u any worsening symptoms or concerns SpO2 Readings from Last 3 Encounters:  04/22/13 97%  04/03/13 96%  03/19/13 100%

## 2013-04-22 NOTE — Progress Notes (Signed)
Subjective:    Patient ID: [name] [name], female    DOB: [birth year], 67 y.o.   MRN: [id number]  HPI  Here for wellness and f/u;  Overall doing ok;  Pt denies CP, worsening SOB, DOE, wheezing, orthopnea, PND, worsening LE edema, palpitations, dizziness or syncope.  Pt denies neurological change such as new headache, facial or extremity weakness.  Pt denies polydipsia, polyuria, or low sugar symptoms. Pt states overall good compliance with treatment and medications, good tolerability, and has been trying to follow lower cholesterol diet.  Pt denies worsening depressive symptoms, suicidal ideation or panic. No fever, night sweats, wt loss, loss of appetite, or other constitutional symptoms.  Pt states good ability with ADL's, has low fall risk, home safety reviewed and adequate, no other significant changes in hearing or vision, and only occasionally active with exercise.  BP at home > 140/90 per pt often, would to go back to the 5 mg.   Past Medical History  Diagnosis Date  . Psoriasis   . Eczema   . DIABETES MELLITUS 01/30/2008  . GOITER, MULTINODULAR 05/21/2010  . GLUCOSE INTOLERANCE 09/03/2007  . HYPERCHOLESTEROLEMIA 09/03/2007  . HYPERTENSION 09/03/2007  . CORONARY ARTERY DISEASE 09/03/2007  . ASTHMA 09/03/2007  . COPD 09/03/2007  . GERD 09/03/2007  . OBESITY 09/03/2007  . DEGENERATIVE JOINT DISEASE 01/30/2008  . ARTHRITIS 01/30/2008  . COLON CANCER 04/07/2008    pt states she had colon cancer 1987  . LVH (left ventricular hypertrophy)   . Osteopenia   . Impaired glucose tolerance 04/10/2011  . COPD (chronic obstructive pulmonary disease) 04/22/2013  . Diabetic peripheral neuropathy 04/22/2013   Past Surgical History  Procedure Laterality Date  . Abdominal hysterectomy    . Ptca    . Hernia repair    . Tubal ligation    . Hemicolectomy  12/1985    rt    reports that she quit smoking about 34 years ago. Her smoking use included Cigarettes. She has a 15 pack-year smoking history. She has never  used smokeless tobacco. She reports that she does not drink alcohol or use illicit drugs. family history includes Cancer in her other; Colon cancer in her other and paternal aunt; Heart disease in her mother; Liver disease in her father; and Thyroid disease in her brother and sister. Allergies  Allergen Reactions  . Simvastatin    Current Outpatient Prescriptions on File Prior to Visit  Medication Sig Dispense Refill  . acyclovir cream (ZOVIRAX) 5 % Apply 1 application topically as needed.      Marland Kitchen ADVAIR DISKUS 250-50 MCG/DOSE AEPB INHALE 1 PUFF INTO THE LUNGS 2 (TWO) TIMES DAILY.  60 each  5  . albuterol (PROVENTIL HFA;VENTOLIN HFA) 108 (90 BASE) MCG/ACT inhaler Inhale 2 puffs into the lungs every 6 (six) hours as needed. Please put in patient file until she is ready for refill thanks  1 Inhaler  6  . aspirin 325 MG EC tablet Take 325 mg by mouth daily.        . clobetasol (TEMOVATE) 0.05 % ointment Use twice once daily to hand and feet as needed  30 g  1  . desonide (DESOWEN) 0.05 % lotion Apply topically. Use under breast two times a day as needed       . fluocinolone (SYNALAR) 0.025 % ointment Apply topically 2 (two) times daily as needed.  30 g  1  . FREESTYLE LITE test strip TEST ONCE DAILY AS DIRECTED  100 each  3  .  Lancets (FREESTYLE) lancets USE AS DIRECTED  100 each  3  . lovastatin (MEVACOR) 40 MG tablet TAKE 2 TABLETS EVERY DAY  60 tablet  9  . metFORMIN (GLUCOPHAGE-XR) 500 MG 24 hr tablet TAKE 2 TABLETS EVERY DAY  180 tablet  1  . metoprolol tartrate (LOPRESSOR) 25 MG tablet TAKE 1 & 1/2 TABLETS TWICE A DAY  90 tablet  11  . naproxen (NAPROSYN) 500 MG tablet TAKE 1 TABLET TWICE A DAY AS NEEDED  60 tablet  11  . omeprazole (PRILOSEC) 20 MG capsule TAKE ONE CAPSULE EVERY DAY  90 capsule  3  . predniSONE (DELTASONE) 10 MG tablet Take 3 tabs for days 1-3, take 2 tabs on days 4-6, take 1 tab on days 7-9  18 tablet  0  . traMADol (ULTRAM) 50 MG tablet Take 1 tablet (50 mg total) by  mouth every 6 (six) hours as needed for pain. 2 tablets daily  120 tablet  5   No current facility-administered medications on file prior to visit.   Review of Systems Constitutional: Negative for diaphoresis, activity change, appetite change or unexpected weight change.  HENT: Negative for hearing loss, ear pain, facial swelling, mouth sores and neck stiffness.   Eyes: Negative for pain, redness and visual disturbance.  Respiratory: Negative for shortness of breath and wheezing.   Cardiovascular: Negative for chest pain and palpitations.  Gastrointestinal: Negative for diarrhea, blood in stool, abdominal distention or other pain Genitourinary: Negative for hematuria, flank pain or change in urine volume.  Musculoskeletal: Negative for myalgias and joint swelling.  Skin: Negative for color change and wound.  Neurological: Negative for syncope and numbness. other than noted Hematological: Negative for adenopathy.  Psychiatric/Behavioral: Negative for hallucinations, self-injury, decreased concentration and agitation.      Objective:   Physical Exam BP 102/72  Pulse 64  Temp(Src) 98.9 F (37.2 C) (Oral)  Wt 150 lb 8 oz (68.266 kg)  BMI 26.67 kg/m2  SpO2 97% VS noted,  Constitutional: Pt is oriented to person, place, and time. Appears well-developed and well-nourished.  Head: Normocephalic and atraumatic.  Right Ear: External ear normal.  Left Ear: External ear normal.  Nose: Nose normal.  Mouth/Throat: Oropharynx is clear and moist.  Eyes: Conjunctivae and EOM are normal. Pupils are equal, round, and reactive to light.  Neck: Normal range of motion. Neck supple. No JVD present. No tracheal deviation present.  Cardiovascular: Normal rate, regular rhythm, normal heart sounds and intact distal pulses.   Pulmonary/Chest: Effort normal and breath sounds normal.  Abdominal: Soft. Bowel sounds are normal. There is no tenderness. No HSM  Musculoskeletal: Normal range of motion. Exhibits  no edema.  Lymphadenopathy:  Has no cervical adenopathy.  Neurological: Pt is alert and oriented to person, place, and time. Pt has normal reflexes. No cranial nerve deficit.  Skin: Skin is warm and dry. No rash noted.  Psychiatric:  Has  normal mood and affect. Behavior is normal.     Assessment & Plan:

## 2013-04-23 ENCOUNTER — Other Ambulatory Visit: Payer: Self-pay | Admitting: Internal Medicine

## 2013-06-17 ENCOUNTER — Ambulatory Visit: Payer: Medicare Other | Admitting: Adult Health

## 2013-06-19 ENCOUNTER — Other Ambulatory Visit: Payer: Self-pay | Admitting: Internal Medicine

## 2013-06-24 ENCOUNTER — Encounter: Payer: Self-pay | Admitting: Adult Health

## 2013-06-24 ENCOUNTER — Ambulatory Visit (INDEPENDENT_AMBULATORY_CARE_PROVIDER_SITE_OTHER): Payer: Medicare Other | Admitting: Adult Health

## 2013-06-24 VITALS — BP 118/74 | HR 64 | Temp 98.3°F | Ht 63.0 in | Wt 155.6 lb

## 2013-06-24 DIAGNOSIS — J45909 Unspecified asthma, uncomplicated: Secondary | ICD-10-CM

## 2013-06-24 MED ORDER — FLUTICASONE-SALMETEROL 250-50 MCG/DOSE IN AEPB: INHALATION_SPRAY | RESPIRATORY_TRACT | Status: DC

## 2013-06-24 NOTE — Patient Instructions (Addendum)
Stay on advair 250 Rescue inhaler as needed follow up Dr. Alva  In 6 months and As needed   

## 2013-06-24 NOTE — Progress Notes (Signed)
  Subjective:    Patient ID: [name] [name], female    DOB: 09-24-1946, 67 y.o.   MRN: [id number]  HPI  67 yo AAF, ex smoker with asthmatic bronchitis, GERD and a component of VCD.  Worse during spring, fall & extreme heat. On xopenex MDI due to after taste & palpitations with albuterol  9/09--required 2 rounds of Abx & prednisone to resolve congestion  4/10 & 11/10-required prednisone dosepak  06/30/2011 Had cut down advair 100/50 to once A day  12/26/2011 increased advair to 250/50    12/14/2012 60m FU She tries to skip doses of advair (since expensive) Bronchitic flares in 9/13 & 12/13 requiring levaquin & prednisone breathing is okay now. at times she does cough, wheezing and chest is tx. she has certain triggers that causes this.  can not afford rescue inhaler - so did not fill Rx Denies nocturnal wheeze No reflux on omeprazole  Spirometry >> FEv1 84% >no change s  06/24/2013 Follow up  Doing well since last ov  Tolerating Advair well with no flare in cough or wheezing.  No ER /Hospital visits since last ov  No increased SABA use, use 1-2 x week.  Does not like the humid high temps outside.  No chest pain, orthopnea or edema.  No hemoptysis or wt loss.     Review of Systems  neg for any significant sore throat, dysphagia, itching, sneezing, nasal congestion or excess/ purulent secretions, fever, chills, sweats, unintended wt loss, pleuritic or exertional cp, hempoptysis, orthopnea pnd or change in chronic leg swelling. Also denies presyncope, palpitations, heartburn, abdominal pain, nausea, vomiting, diarrhea or change in bowel or urinary habits, dysuria,hematuria, rash, arthralgias, visual complaints, headache, numbness weakness or ataxia.     Objective:   Physical Exam   Gen. Pleasant, well-nourished, in no distress ENT - no lesions, no post nasal drip Neck: No JVD, no thyromegaly, no carotid bruits Lungs: no use of accessory muscles, no dullness to percussion, clear  without rales or rhonchi  Cardiovascular: Rhythm regular, heart sounds  normal, no murmurs or gallops, no peripheral edema Musculoskeletal: No deformities, no cyanosis or clubbing         Assessment & Plan:

## 2013-06-24 NOTE — Assessment & Plan Note (Signed)
Compensated on present regimen  No changes  follow up 6 months with Dr. Vassie Loll

## 2013-07-23 ENCOUNTER — Other Ambulatory Visit: Payer: Self-pay | Admitting: Internal Medicine

## 2013-07-26 ENCOUNTER — Other Ambulatory Visit: Payer: Self-pay | Admitting: Internal Medicine

## 2013-10-17 ENCOUNTER — Telehealth: Payer: Self-pay | Admitting: Pulmonary Disease

## 2013-10-17 ENCOUNTER — Telehealth: Payer: Self-pay | Admitting: *Deleted

## 2013-10-17 NOTE — Telephone Encounter (Signed)
Ok for [employer] or symbicort sample at 2 puffs bid in place of the advair if have samples

## 2013-10-17 NOTE — Telephone Encounter (Signed)
I spoke with pt. I advised her we do not have any samples at this time. No rx needed.

## 2013-10-17 NOTE — Telephone Encounter (Signed)
Pt called requesting Advair Diskus samples due to being in the [facility] with insurance.  We do not have samples on hand.  Please advise

## 2013-10-18 NOTE — Telephone Encounter (Signed)
Spoke with pt advised Symbicort samples ready for pick up as per MDs order

## 2013-10-23 ENCOUNTER — Other Ambulatory Visit: Payer: Self-pay | Admitting: Internal Medicine

## 2013-10-23 NOTE — Telephone Encounter (Signed)
Done hardcopy to robin  

## 2013-10-23 NOTE — Telephone Encounter (Signed)
Faxed hardcopy to [facility]

## 2013-10-28 ENCOUNTER — Ambulatory Visit: Payer: Medicare Other | Admitting: Internal Medicine

## 2013-10-29 ENCOUNTER — Ambulatory Visit (INDEPENDENT_AMBULATORY_CARE_PROVIDER_SITE_OTHER): Payer: Medicare Other | Admitting: Internal Medicine

## 2013-10-29 ENCOUNTER — Encounter: Payer: Self-pay | Admitting: Internal Medicine

## 2013-10-29 ENCOUNTER — Other Ambulatory Visit (INDEPENDENT_AMBULATORY_CARE_PROVIDER_SITE_OTHER): Payer: Medicare Other

## 2013-10-29 VITALS — BP 120/72 | HR 80 | Temp 97.9°F | Ht 63.0 in | Wt 155.2 lb

## 2013-10-29 DIAGNOSIS — I10 Essential (primary) hypertension: Secondary | ICD-10-CM

## 2013-10-29 DIAGNOSIS — J449 Chronic obstructive pulmonary disease, unspecified: Secondary | ICD-10-CM

## 2013-10-29 DIAGNOSIS — E1140 Type 2 diabetes mellitus with diabetic neuropathy, unspecified: Secondary | ICD-10-CM

## 2013-10-29 DIAGNOSIS — Z0000 Encounter for general adult medical examination without abnormal findings: Secondary | ICD-10-CM

## 2013-10-29 DIAGNOSIS — E1142 Type 2 diabetes mellitus with diabetic polyneuropathy: Secondary | ICD-10-CM

## 2013-10-29 DIAGNOSIS — E7800 Pure hypercholesterolemia, unspecified: Secondary | ICD-10-CM

## 2013-10-29 DIAGNOSIS — E1149 Type 2 diabetes mellitus with other diabetic neurological complication: Secondary | ICD-10-CM

## 2013-10-29 LAB — HEPATIC FUNCTION PANEL
ALT: 16 U/L (ref 0–35)
AST: 17 U/L (ref 0–37)
Alkaline Phosphatase: 65 U/L (ref 39–117)
Bilirubin, Direct: 0.1 mg/dL (ref 0.0–0.3)
Total Bilirubin: 0.8 mg/dL (ref 0.3–1.2)

## 2013-10-29 LAB — BASIC METABOLIC PANEL
Chloride: 102 mEq/L (ref 96–112)
Creatinine, Ser: 0.6 mg/dL (ref 0.4–1.2)
Potassium: 4.1 mEq/L (ref 3.5–5.1)
Sodium: 138 mEq/L (ref 135–145)

## 2013-10-29 LAB — LIPID PANEL
LDL Cholesterol: 91 mg/dL (ref 0–99)
Total CHOL/HDL Ratio: 3

## 2013-10-29 NOTE — Assessment & Plan Note (Signed)
stable overall by history and exam, recent data reviewed with pt, and pt to continue medical treatment as before,  to f/u any worsening symptoms or concerns Lab Results  Component Value Date   HGBA1C 6.5 10/29/2013

## 2013-10-29 NOTE — Assessment & Plan Note (Signed)
stable overall by history and exam, recent data reviewed with pt, and pt to continue medical treatment as before,  to f/u any worsening symptoms or concerns SpO2 Readings from Last 3 Encounters:  10/29/13 93%  06/24/13 98%  04/22/13 97%

## 2013-10-29 NOTE — Progress Notes (Signed)
Pre-visit discussion using our clinic review tool. No additional management support is needed unless otherwise documented below in the visit note.  

## 2013-10-29 NOTE — Assessment & Plan Note (Signed)
Pt declines change of amlod to avapro for renoprotection, might consider late

## 2013-10-29 NOTE — Assessment & Plan Note (Signed)
stable overall by history and exam, recent data reviewed with pt, and pt to continue medical treatment as before,  to f/u any worsening symptoms or concerns Lab Results  Component Value Date   CHOL 158 10/29/2013   HDL 55.50 10/29/2013   LDLCALC 91 10/29/2013   TRIG 56.0 10/29/2013   CHOLHDL 3 10/29/2013

## 2013-10-29 NOTE — Patient Instructions (Addendum)
Please call if you change your mind about changing the amlodipine to the generic avapro for blood pressure and protecting the kidneys from sugar Please return if you change your mind about the new Prevnar pneumonia shot You are given the advair sample today Please go to the LAB in the Basement (turn left off the elevator) for the tests to be done today You will be contacted by phone if any changes need to be made immediately.  Otherwise, you will receive a letter about your results with an explanation, but please check with MyChart first.  Please remember to sign up for My Chart if you have not done so, as this will be important to you in the future with finding out test results, communicating by private email, and scheduling acute appointments online when needed.  Please return in 6 months, or sooner if needed

## 2013-10-29 NOTE — Progress Notes (Signed)
Subjective:    Patient ID: [name] [name], female    DOB: 02-Mar-1946, 67 y.o.   MRN: [id number]  HPI  Here to f/u; overall doing ok,  Pt denies chest pain, increased sob or doe, wheezing, orthopnea, PND, increased LE swelling, palpitations, dizziness or syncope.  Pt denies polydipsia, polyuria, or low sugar symptoms such as weakness or confusion improved with po intake.  Pt denies new neurological symptoms such as new headache, or facial or extremity weakness or numbness.   Pt states overall good compliance with meds, has been trying to follow lower cholesterol, diabetic diet, with wt overall stable,  but little exercise however. Now in the medicare "gap", unable to recently afford the advair Past Medical History  Diagnosis Date  . Psoriasis   . Eczema   . DIABETES MELLITUS 01/30/2008  . GOITER, MULTINODULAR 05/21/2010  . GLUCOSE INTOLERANCE 09/03/2007  . HYPERCHOLESTEROLEMIA 09/03/2007  . HYPERTENSION 09/03/2007  . CORONARY ARTERY DISEASE 09/03/2007  . ASTHMA 09/03/2007  . COPD 09/03/2007  . GERD 09/03/2007  . OBESITY 09/03/2007  . DEGENERATIVE JOINT DISEASE 01/30/2008  . ARTHRITIS 01/30/2008  . COLON CANCER 04/07/2008    pt states she had colon cancer 1987  . LVH (left ventricular hypertrophy)   . Osteopenia   . Impaired glucose tolerance 04/10/2011  . COPD (chronic obstructive pulmonary disease) 04/22/2013  . Diabetic peripheral neuropathy 04/22/2013   Past Surgical History  Procedure Laterality Date  . Abdominal hysterectomy    . Ptca    . Hernia repair    . Tubal ligation    . Hemicolectomy  12/1985    rt    reports that she quit smoking about 34 years ago. Her smoking use included Cigarettes. She has a 15 pack-year smoking history. She has never used smokeless tobacco. She reports that she does not drink alcohol or use illicit drugs. family history includes Cancer in her other; Colon cancer in her other and paternal aunt; Heart disease in her mother; Liver disease in her father; Thyroid  disease in her brother and sister. Allergies  Allergen Reactions  . Simvastatin    Current Outpatient Prescriptions on File Prior to Visit  Medication Sig Dispense Refill  . acyclovir cream (ZOVIRAX) 5 % Apply 1 application topically as needed.      Marland Kitchen amLODipine (NORVASC) 5 MG tablet Take 1 tablet (5 mg total) by mouth daily.  90 tablet  3  . aspirin 325 MG EC tablet Take 325 mg by mouth daily.        . clobetasol (TEMOVATE) 0.05 % ointment Use twice once daily to hand and feet as needed  30 g  1  . desonide (DESOWEN) 0.05 % lotion Apply topically. Use under breast two times a day as needed       . fluocinolone (SYNALAR) 0.025 % ointment Apply topically 2 (two) times daily as needed.  30 g  1  . Fluticasone-Salmeterol (ADVAIR DISKUS) 250-50 MCG/DOSE AEPB INHALE 1 PUFF INTO THE LUNGS 2 (TWO) TIMES DAILY.  60 each  5  . FREESTYLE LITE test strip TEST ONCE DAILY AS DIRECTED  100 each  6  . hydrochlorothiazide (HYDRODIURIL) 25 MG tablet TAKE 1 TABLET EVERY DAY  31 tablet  11  . ketoconazole (NIZORAL) 2 % cream Apply topically 3 (three) times daily. use as directed two times a day as needed under breast  15 g  3  . Lancets (FREESTYLE) lancets USE AS DIRECTED  100 each  3  . loratadine-pseudoephedrine (CLARITIN-D  24-HOUR) 10-240 MG per 24 hr tablet Take 1 tablet by mouth daily.      Marland Kitchen lovastatin (MEVACOR) 40 MG tablet TAKE 2 TABLETS EVERY DAY  60 tablet  9  . metFORMIN (GLUCOPHAGE-XR) 500 MG 24 hr tablet TAKE 2 TABLETS EVERY DAY  180 tablet  3  . metoprolol tartrate (LOPRESSOR) 25 MG tablet TAKE 1 & 1/2 TABLETS TWICE A DAY  90 tablet  11  . naproxen (NAPROSYN) 500 MG tablet TAKE 1 TABLET TWICE A DAY AS NEEDED  60 tablet  9  . omeprazole (PRILOSEC) 20 MG capsule TAKE ONE CAPSULE EVERY DAY  90 capsule  3  . traMADol (ULTRAM) 50 MG tablet TAKE 1 TABLET EVERY 6 HOURS AS NEEDED FOR PAIN  120 tablet  3  . albuterol (PROVENTIL HFA;VENTOLIN HFA) 108 (90 BASE) MCG/ACT inhaler Inhale 2 puffs into the lungs  every 6 (six) hours as needed. Please put in patient file until she is ready for refill thanks  1 Inhaler  6   No current facility-administered medications on file prior to visit.    Review of Systems  Constitutional: Negative for unexpected weight change, or unusual diaphoresis  HENT: Negative for tinnitus.   Eyes: Negative for photophobia and visual disturbance.  Respiratory: Negative for choking and stridor.   Gastrointestinal: Negative for vomiting and blood in stool.  Genitourinary: Negative for hematuria and decreased urine volume.  Musculoskeletal: Negative for acute joint swelling Skin: Negative for color change and wound.  Neurological: Negative for tremors and numbness other than noted  Psychiatric/Behavioral: Negative for decreased concentration or  hyperactivity.       Objective:   Physical Exam BP 120/72  Pulse 80  Temp(Src) 97.9 F (36.6 C) (Oral)  Ht 5\' 3"  (1.6 m)  Wt 155 lb 4 oz (70.421 kg)  BMI 27.51 kg/m2  SpO2 93% VS noted,  Constitutional: Pt appears well-developed and well-nourished.  HENT: Head: NCAT.  Right Ear: External ear normal.  Left Ear: External ear normal.  Eyes: Conjunctivae and EOM are normal. Pupils are equal, round, and reactive to light.  Neck: Normal range of motion. Neck supple.  Cardiovascular: Normal rate and regular rhythm.   Pulmonary/Chest: Effort normal and breath sounds normal.  Abd:  Soft, NT, non-distended, + BS Neurological: Pt is alert. Not confused  Skin: Skin is warm. No erythema.  Psychiatric: Pt behavior is normal. Thought content normal.     Assessment & Plan:  Quality Measures addressed:  ACE/ARB therapy for CAD, Diabetes, and/or LVSD: pt declines further medication

## 2013-11-13 ENCOUNTER — Telehealth: Payer: Self-pay | Admitting: Internal Medicine

## 2013-11-13 NOTE — Telephone Encounter (Signed)
Pt called stated that she might have kidney infection, blood in urine for 2 days. Pt request to be work in tomorrow, no appt with any PCP here at all. Please advise.

## 2013-11-13 NOTE — Telephone Encounter (Signed)
Ok with me 

## 2013-11-14 ENCOUNTER — Ambulatory Visit (INDEPENDENT_AMBULATORY_CARE_PROVIDER_SITE_OTHER): Payer: Medicare Other | Admitting: Internal Medicine

## 2013-11-14 ENCOUNTER — Other Ambulatory Visit: Payer: Medicare Other

## 2013-11-14 ENCOUNTER — Encounter: Payer: Self-pay | Admitting: Internal Medicine

## 2013-11-14 VITALS — BP 120/70 | HR 85 | Temp 98.2°F | Ht 63.0 in | Wt 157.0 lb

## 2013-11-14 DIAGNOSIS — E1149 Type 2 diabetes mellitus with other diabetic neurological complication: Secondary | ICD-10-CM

## 2013-11-14 DIAGNOSIS — R300 Dysuria: Secondary | ICD-10-CM | POA: Insufficient documentation

## 2013-11-14 DIAGNOSIS — I10 Essential (primary) hypertension: Secondary | ICD-10-CM

## 2013-11-14 DIAGNOSIS — E1142 Type 2 diabetes mellitus with diabetic polyneuropathy: Secondary | ICD-10-CM

## 2013-11-14 DIAGNOSIS — E1140 Type 2 diabetes mellitus with diabetic neuropathy, unspecified: Secondary | ICD-10-CM

## 2013-11-14 DIAGNOSIS — R319 Hematuria, unspecified: Secondary | ICD-10-CM

## 2013-11-14 LAB — POCT URINALYSIS DIPSTICK
Ketones, UA: NEGATIVE
Protein, UA: NEGATIVE
Spec Grav, UA: 1.01

## 2013-11-14 MED ORDER — AMLODIPINE BESYLATE 5 MG PO TABS: 5.0000 mg | ORAL_TABLET | Freq: Every day | ORAL | Status: DC

## 2013-11-14 MED ORDER — CEPHALEXIN 500 MG PO CAPS: 500.0000 mg | ORAL_CAPSULE | Freq: Four times a day (QID) | ORAL | Status: DC

## 2013-11-14 NOTE — Assessment & Plan Note (Signed)
stable overall by history and exam, recent data reviewed with pt, and pt to continue medical treatment as before,  to f/u any worsening symptoms or concerns BP Readings from Last 3 Encounters:  11/14/13 120/70  10/29/13 120/72  06/24/13 118/74

## 2013-11-14 NOTE — Progress Notes (Signed)
Subjective:    Patient ID: [name] [name], female    DOB: [birth year], 67 y.o.   MRN: [id number]  HPI  Here with  3 days onset gradually worsening dysuria and hematuria, with urgency and freq, and some radiation to the lower lumbar area and low mid abd pain, just feels bad.  No n/v and Denies urinary symptoms such as flank pain, n/v, fever, chills. No recent UTi - last one yrs ago. Pt denies chest pain, increased sob or doe, wheezing, orthopnea, PND, increased LE swelling, palpitations, dizziness or syncope.  Pt denies new neurological symptoms such as new headache, or facial or extremity weakness or numbness Past Medical History  Diagnosis Date  . Psoriasis   . Eczema   . DIABETES MELLITUS 01/30/2008  . GOITER, MULTINODULAR 05/21/2010  . GLUCOSE INTOLERANCE 09/03/2007  . HYPERCHOLESTEROLEMIA 09/03/2007  . HYPERTENSION 09/03/2007  . CORONARY ARTERY DISEASE 09/03/2007  . ASTHMA 09/03/2007  . COPD 09/03/2007  . GERD 09/03/2007  . OBESITY 09/03/2007  . DEGENERATIVE JOINT DISEASE 01/30/2008  . ARTHRITIS 01/30/2008  . COLON CANCER 04/07/2008    pt states she had colon cancer 1987  . LVH (left ventricular hypertrophy)   . Osteopenia   . Impaired glucose tolerance 04/10/2011  . COPD (chronic obstructive pulmonary disease) 04/22/2013  . Diabetic peripheral neuropathy 04/22/2013   Past Surgical History  Procedure Laterality Date  . Abdominal hysterectomy    . Ptca    . Hernia repair    . Tubal ligation    . Hemicolectomy  12/1985    rt    reports that she quit smoking about 34 years ago. Her smoking use included Cigarettes. She has a 15 pack-year smoking history. She has never used smokeless tobacco. She reports that she does not drink alcohol or use illicit drugs. family history includes Cancer in her other; Colon cancer in her other and paternal aunt; Heart disease in her mother; Liver disease in her father; Thyroid disease in her brother and sister. Allergies  Allergen Reactions  . Simvastatin      Current Outpatient Prescriptions on File Prior to Visit  Medication Sig Dispense Refill  . acyclovir cream (ZOVIRAX) 5 % Apply 1 application topically as needed.      Marland Kitchen aspirin 325 MG EC tablet Take 325 mg by mouth daily.        . clobetasol (TEMOVATE) 0.05 % ointment Use twice once daily to hand and feet as needed  30 g  1  . desonide (DESOWEN) 0.05 % lotion Apply topically. Use under breast two times a day as needed       . fluocinolone (SYNALAR) 0.025 % ointment Apply topically 2 (two) times daily as needed.  30 g  1  . Fluticasone-Salmeterol (ADVAIR DISKUS) 250-50 MCG/DOSE AEPB INHALE 1 PUFF INTO THE LUNGS 2 (TWO) TIMES DAILY.  60 each  5  . FREESTYLE LITE test strip TEST ONCE DAILY AS DIRECTED  100 each  6  . hydrochlorothiazide (HYDRODIURIL) 25 MG tablet TAKE 1 TABLET EVERY DAY  31 tablet  11  . ketoconazole (NIZORAL) 2 % cream Apply topically 3 (three) times daily. use as directed two times a day as needed under breast  15 g  3  . Lancets (FREESTYLE) lancets USE AS DIRECTED  100 each  3  . loratadine-pseudoephedrine (CLARITIN-D 24-HOUR) 10-240 MG per 24 hr tablet Take 1 tablet by mouth daily.      Marland Kitchen lovastatin (MEVACOR) 40 MG tablet TAKE 2 TABLETS EVERY DAY  60 tablet  9  . metFORMIN (GLUCOPHAGE-XR) 500 MG 24 hr tablet TAKE 2 TABLETS EVERY DAY  180 tablet  3  . metoprolol tartrate (LOPRESSOR) 25 MG tablet TAKE 1 & 1/2 TABLETS TWICE A DAY  90 tablet  11  . naproxen (NAPROSYN) 500 MG tablet TAKE 1 TABLET TWICE A DAY AS NEEDED  60 tablet  9  . omeprazole (PRILOSEC) 20 MG capsule TAKE ONE CAPSULE EVERY DAY  90 capsule  3  . traMADol (ULTRAM) 50 MG tablet TAKE 1 TABLET EVERY 6 HOURS AS NEEDED FOR PAIN  120 tablet  3  . albuterol (PROVENTIL HFA;VENTOLIN HFA) 108 (90 BASE) MCG/ACT inhaler Inhale 2 puffs into the lungs every 6 (six) hours as needed. Please put in patient file until she is ready for refill thanks  1 Inhaler  6   No current facility-administered medications on file prior to  visit.   Review of Systems  Constitutional: Negative for unexpected weight change, or unusual diaphoresis  HENT: Negative for tinnitus.   Eyes: Negative for photophobia and visual disturbance.  Respiratory: Negative for choking and stridor.   Gastrointestinal: Negative for vomiting and blood in stool.  Genitourinary: Negative for hematuria and decreased urine volume.  Musculoskeletal: Negative for acute joint swelling Skin: Negative for color change and wound.  Neurological: Negative for tremors and numbness other than noted  Psychiatric/Behavioral: Negative for decreased concentration or  hyperactivity.       Objective:   Physical Exam BP 120/70  Pulse 85  Temp(Src) 98.2 F (36.8 C) (Oral)  Ht 5\' 3"  (1.6 m)  Wt 157 lb (71.215 kg)  BMI 27.82 kg/m2  SpO2 97% VS noted, mild ill appearing Constitutional: Pt appears well-developed and well-nourished.  HENT: Head: NCAT.  Right Ear: External ear normal.  Left Ear: External ear normal.  Eyes: Conjunctivae and EOM are normal. Pupils are equal, round, and reactive to light.  Neck: Normal range of motion. Neck supple.  Cardiovascular: Normal rate and regular rhythm.   Pulmonary/Chest: Effort normal and breath sounds normal.  Abd:  Soft, NT, non-distended, + BS except for mild low mid abd tender, no guarding, no flank tender Neurological: Pt is alert. Not confused  Skin: Skin is warm. No erythema.  Psychiatric: Pt behavior is normal. Thought content normal.     Assessment & Plan:

## 2013-11-14 NOTE — Telephone Encounter (Signed)
Scheduled for 3:15 today

## 2013-11-14 NOTE — Assessment & Plan Note (Addendum)
UA dip reviewed, prob UTI/cystitis - for antibx, urine cx,  to f/u any worsening symptoms or concerns

## 2013-11-14 NOTE — Assessment & Plan Note (Signed)
stable overall by history and exam, recent data reviewed with pt, and pt to continue medical treatment as before,  to f/u any worsening symptoms or concerns Lab Results  Component Value Date   HGBA1C 6.5 10/29/2013  to call for onset polys or cbg > 200 with current illness

## 2013-11-14 NOTE — Patient Instructions (Signed)
Please take all new medication as prescribed Please continue all other medications as before Please have the pharmacy call with any other refills you may need.  Your specimen will be sent to lab for culture, which take 2-3 days to come back with a result  Please call if you are not improved, or you have persistent blood such that you might need a referral to urology (but I doubt this will be needed)

## 2013-11-14 NOTE — Progress Notes (Signed)
Pre-visit discussion using our clinic review tool. No additional management support is needed unless otherwise documented below in the visit note.  

## 2013-11-15 ENCOUNTER — Ambulatory Visit: Payer: Medicare Other | Admitting: Internal Medicine

## 2013-11-16 LAB — URINE CULTURE

## 2013-11-18 ENCOUNTER — Encounter: Payer: Self-pay | Admitting: Internal Medicine

## 2013-12-04 ENCOUNTER — Other Ambulatory Visit: Payer: Self-pay | Admitting: Internal Medicine

## 2013-12-13 ENCOUNTER — Other Ambulatory Visit: Payer: Self-pay | Admitting: Internal Medicine

## 2013-12-23 ENCOUNTER — Encounter: Payer: Self-pay | Admitting: Adult Health

## 2013-12-23 ENCOUNTER — Ambulatory Visit (INDEPENDENT_AMBULATORY_CARE_PROVIDER_SITE_OTHER): Payer: Medicare Other | Admitting: Adult Health

## 2013-12-23 VITALS — BP 120/74 | HR 84 | Temp 98.7°F | Ht 62.0 in | Wt 156.8 lb

## 2013-12-23 DIAGNOSIS — J449 Chronic obstructive pulmonary disease, unspecified: Secondary | ICD-10-CM

## 2013-12-23 DIAGNOSIS — J45909 Unspecified asthma, uncomplicated: Secondary | ICD-10-CM

## 2013-12-23 NOTE — Progress Notes (Signed)
  Subjective:    Patient ID: [name] [name], female    DOB: 1946/06/12, 68 y.o.   MRN: [id number]  HPI  68 yo AAF, ex smoker with asthmatic bronchitis, GERD and a component of VCD.  Worse during spring, fall & extreme heat. On xopenex MDI due to after taste & palpitations with albuterol  9/09--required 2 rounds of Abx & prednisone to resolve congestion  4/10 & 11/10-required prednisone dosepak  06/30/2011 Had cut down advair 100/50 to once A day  12/26/2011 increased advair to 250/50    12/14/2012 11m FU She tries to skip doses of advair (since expensive) Bronchitic flares in 9/13 & 12/13 requiring levaquin & prednisone breathing is okay now. at times she does cough, wheezing and chest is tx. she has certain triggers that causes this.  can not afford rescue inhaler - so did not fill Rx Denies nocturnal wheeze No reflux on omeprazole  Spirometry >> FEv1 84% >no change s  06/21/13 Follow up  Doing well since last ov  Tolerating Advair well with no flare in cough or wheezing.  No ER /Hospital visits since last ov  No increased SABA use, use 1-2 x week.  Does not like the humid high temps outside.  No chest pain, orthopnea or edema.  No hemoptysis or wt loss.    12/23/2013 Follow up  Returns for follow up for asthma,  GERD and a component of VCD. Marland Kitchen Remains on Advair , mainly takes once daily.  Has occasional cough with thick white mucus.  Patient says that overall she is doing well without any flare in cough or wheezing. She has had no SABA use.  No ER hospital visits since last OV .  Patient denies any hemoptysis, orthopnea, PND, or leg swelling.     Review of Systems  neg for any significant sore throat, dysphagia, itching, sneezing, nasal congestion or excess/ purulent secretions, fever, chills, sweats, unintended wt loss, pleuritic or exertional cp, hempoptysis, orthopnea pnd or change in chronic leg swelling. Also denies presyncope, palpitations, heartburn, abdominal pain, nausea,  vomiting, diarrhea or change in bowel or urinary habits, dysuria,hematuria, rash, arthralgias, visual complaints, headache, numbness weakness or ataxia.     Objective:   Physical Exam   Gen. Pleasant, well-nourished, in no distress ENT - no lesions, no post nasal drip Neck: No JVD, no thyromegaly, no carotid bruits Lungs: no use of accessory muscles, no dullness to percussion, clear without rales or rhonchi  Cardiovascular: Rhythm regular, heart sounds  normal, no murmurs or gallops, no peripheral edema Musculoskeletal: No deformities, no cyanosis or clubbing         Assessment & Plan:

## 2013-12-23 NOTE — Patient Instructions (Signed)
Stay on advair 250 Rescue inhaler as needed follow up Dr. Elsworth Soho  In 6 months and As needed

## 2013-12-24 NOTE — Assessment & Plan Note (Signed)
Compensated on present regimen   Plan  Stay on advair 250 Rescue inhaler as needed follow up Dr. Elsworth Soho  In 6 months and As needed

## 2013-12-25 ENCOUNTER — Other Ambulatory Visit: Payer: Self-pay | Admitting: Internal Medicine

## 2014-01-09 ENCOUNTER — Ambulatory Visit (INDEPENDENT_AMBULATORY_CARE_PROVIDER_SITE_OTHER): Payer: Medicare Other | Admitting: Internal Medicine

## 2014-01-09 ENCOUNTER — Encounter: Payer: Self-pay | Admitting: Internal Medicine

## 2014-01-09 VITALS — BP 112/70 | HR 87 | Temp 98.7°F | Ht 63.0 in | Wt 155.0 lb

## 2014-01-09 DIAGNOSIS — J441 Chronic obstructive pulmonary disease with (acute) exacerbation: Secondary | ICD-10-CM | POA: Insufficient documentation

## 2014-01-09 DIAGNOSIS — I10 Essential (primary) hypertension: Secondary | ICD-10-CM

## 2014-01-09 DIAGNOSIS — E1165 Type 2 diabetes mellitus with hyperglycemia: Secondary | ICD-10-CM

## 2014-01-09 DIAGNOSIS — IMO0001 Reserved for inherently not codable concepts without codable children: Secondary | ICD-10-CM

## 2014-01-09 DIAGNOSIS — J209 Acute bronchitis, unspecified: Secondary | ICD-10-CM | POA: Insufficient documentation

## 2014-01-09 MED ORDER — LEVOFLOXACIN 250 MG PO TABS: 250.0000 mg | ORAL_TABLET | Freq: Every day | ORAL | Status: DC

## 2014-01-09 MED ORDER — PREDNISONE 10 MG PO TABS: ORAL_TABLET | ORAL | Status: DC

## 2014-01-09 MED ORDER — METHYLPREDNISOLONE ACETATE 80 MG/ML IJ SUSP
80.0000 mg | Freq: Once | INTRAMUSCULAR | Status: AC
  Administered 2014-01-09: 80 mg via INTRAMUSCULAR

## 2014-01-09 MED ORDER — HYDROCODONE-HOMATROPINE 5-1.5 MG/5ML PO SYRP: 5.0000 mL | ORAL_SOLUTION | Freq: Four times a day (QID) | ORAL | Status: DC | PRN

## 2014-01-09 NOTE — Progress Notes (Signed)
Pre-visit discussion using our clinic review tool. No additional management support is needed unless otherwise documented below in the visit note.  

## 2014-01-09 NOTE — Patient Instructions (Signed)
You had the steroid shot today Please take all new medication as prescribed - the antibiotic, cough med, and prednisone You can also take Mucinex (or it's generic off brand) for congestion, and tylenol as needed for pain. You are also given the sample of the Xopenex inhaler (which is similar to albuterol) to take at 2 puffs  - four times per day if needed Please continue all other medications as before, and refills have been done if requested. Please have the pharmacy call with any other refills you may need.

## 2014-01-09 NOTE — Progress Notes (Signed)
Subjective:    Patient ID: [name] [name], female    DOB: [birth year], 68 y.o.   MRN: [id number]  HPI  Here with acute onset mild to mod 2-3 days ST, HA, general weakness and malaise, with prod cough greenish sputum, but Pt denies chest pain, increased sob or doe, wheezing, orthopnea, PND, increased LE swelling, palpitations, dizziness or syncope, except for onset wheezing yesterday with mild sob/doe.  Past Medical History  Diagnosis Date  . Psoriasis   . Eczema   . DIABETES MELLITUS 01/30/2008  . GOITER, MULTINODULAR 05/21/2010  . GLUCOSE INTOLERANCE 09/03/2007  . HYPERCHOLESTEROLEMIA 09/03/2007  . HYPERTENSION 09/03/2007  . CORONARY ARTERY DISEASE 09/03/2007  . ASTHMA 09/03/2007  . COPD 09/03/2007  . GERD 09/03/2007  . OBESITY 09/03/2007  . DEGENERATIVE JOINT DISEASE 01/30/2008  . ARTHRITIS 01/30/2008  . COLON CANCER 04/07/2008    pt states she had colon cancer 1987  . LVH (left ventricular hypertrophy)   . Osteopenia   . Impaired glucose tolerance 04/10/2011  . COPD (chronic obstructive pulmonary disease) 04/22/2013  . Diabetic peripheral neuropathy 04/22/2013   Past Surgical History  Procedure Laterality Date  . Abdominal hysterectomy    . Ptca    . Hernia repair    . Tubal ligation    . Hemicolectomy  12/1985    rt    reports that she quit smoking about 35 years ago. Her smoking use included Cigarettes. She has a 15 pack-year smoking history. She has never used smokeless tobacco. She reports that she does not drink alcohol or use illicit drugs. family history includes Cancer in her other; Colon cancer in her other and paternal aunt; Heart disease in her mother; Liver disease in her father; Thyroid disease in her brother and sister. Allergies  Allergen Reactions  . Simvastatin    Current Outpatient Prescriptions on File Prior to Visit  Medication Sig Dispense Refill  . acyclovir cream (ZOVIRAX) 5 % Apply 1 application topically as needed.      Marland Kitchen albuterol (PROVENTIL HFA;VENTOLIN  HFA) 108 (90 BASE) MCG/ACT inhaler Inhale 2 puffs into the lungs every 6 (six) hours as needed. Please put in patient file until she is ready for refill thanks  1 Inhaler  6  . amLODipine (NORVASC) 5 MG tablet TAKE 1 TABLET (5 MG TOTAL) BY MOUTH DAILY.  30 tablet  11  . aspirin 325 MG EC tablet Take 325 mg by mouth daily.        . clobetasol (TEMOVATE) 0.05 % ointment Use twice once daily to hand and feet as needed  30 g  1  . desonide (DESOWEN) 0.05 % lotion Apply topically. Use under breast two times a day as needed       . fluocinolone (SYNALAR) 0.025 % ointment Apply topically 2 (two) times daily as needed.  30 g  1  . Fluticasone-Salmeterol (ADVAIR DISKUS) 250-50 MCG/DOSE AEPB INHALE 1 PUFF INTO THE LUNGS 2 (TWO) TIMES DAILY.  60 each  5  . FREESTYLE LITE test strip TEST ONCE DAILY AS DIRECTED  100 each  6  . hydrochlorothiazide (HYDRODIURIL) 25 MG tablet TAKE 1 TABLET EVERY DAY  31 tablet  11  . ketoconazole (NIZORAL) 2 % cream Apply topically 3 (three) times daily. use as directed two times a day as needed under breast  15 g  3  . Lancets (FREESTYLE) lancets USE AS DIRECTED  100 each  11  . loratadine-pseudoephedrine (CLARITIN-D 24-HOUR) 10-240 MG per 24 hr tablet Take 1  tablet by mouth daily.      Marland Kitchen lovastatin (MEVACOR) 40 MG tablet TAKE 2 TABLETS EVERY DAY  60 tablet  9  . metFORMIN (GLUCOPHAGE-XR) 500 MG 24 hr tablet TAKE 2 TABLETS EVERY DAY  180 tablet  3  . metoprolol tartrate (LOPRESSOR) 25 MG tablet TAKE 1 & 1/2 TABLETS TWICE A DAY  90 tablet  11  . naproxen (NAPROSYN) 500 MG tablet TAKE 1 TABLET TWICE A DAY AS NEEDED  60 tablet  9  . omeprazole (PRILOSEC) 20 MG capsule TAKE ONE CAPSULE EVERY DAY  90 capsule  3  . traMADol (ULTRAM) 50 MG tablet TAKE 1 TABLET EVERY 6 HOURS AS NEEDED FOR PAIN  120 tablet  3   No current facility-administered medications on file prior to visit.   Review of Systems  Constitutional: Negative for unexpected weight change, or unusual diaphoresis  HENT:  Negative for tinnitus.   Eyes: Negative for photophobia and visual disturbance.  Respiratory: Negative for choking and stridor.   Gastrointestinal: Negative for vomiting and blood in stool.  Genitourinary: Negative for hematuria and decreased urine volume.  Musculoskeletal: Negative for acute joint swelling Skin: Negative for color change and wound.  Neurological: Negative for tremors and numbness other than noted  Psychiatric/Behavioral: Negative for decreased concentration or  hyperactivity.       Objective:   Physical Exam BP 112/70  Pulse 87  Temp(Src) 98.7 F (37.1 C) (Oral)  Ht 5\' 3"  (1.6 m)  Wt 155 lb (70.308 kg)  BMI 27.46 kg/m2  SpO2 96% VS noted, mild ill  Constitutional: Pt appears well-developed and well-nourished.  HENT: Head: NCAT.  Right Ear: External ear normal.  Left Ear: External ear normal.  Bilat tm's with mild erythema.  Max sinus areas non tender.  Pharynx with mild erythema, no exudate Eyes: Conjunctivae and EOM are normal. Pupils are equal, round, and reactive to light.  Neck: Normal range of motion. Neck supple.  Cardiovascular: Normal rate and regular rhythm.   Pulmonary/Chest: Effort normal and breath sounds decresaed with bilat wheezes, normal WOB Neurological: Pt is alert. Not confused , motor 5/5 Skin: Skin is warm. No erythema. No LE edema Psychiatric: Pt behavior is normal. Thought content normal.     Assessment & Plan:

## 2014-01-10 ENCOUNTER — Telehealth: Payer: Self-pay | Admitting: Internal Medicine

## 2014-01-10 NOTE — Telephone Encounter (Signed)
Relevant patient education mailed to patient.  

## 2014-03-06 ENCOUNTER — Other Ambulatory Visit: Payer: Self-pay | Admitting: Internal Medicine

## 2014-03-06 DIAGNOSIS — Z1231 Encounter for screening mammogram for malignant neoplasm of breast: Secondary | ICD-10-CM

## 2014-03-18 ENCOUNTER — Ambulatory Visit (HOSPITAL_COMMUNITY)
Admission: RE | Admit: 2014-03-18 | Discharge: 2014-03-18 | Disposition: A | Discharge status: Home / Self Care | Payer: Medicare Other | Source: Ambulatory Visit | Attending: Internal Medicine | Admitting: Internal Medicine

## 2014-03-18 DIAGNOSIS — Z1231 Encounter for screening mammogram for malignant neoplasm of breast: Secondary | ICD-10-CM | POA: Insufficient documentation

## 2014-03-18 LAB — HM MAMMOGRAPHY

## 2014-04-22 ENCOUNTER — Other Ambulatory Visit: Payer: Self-pay | Admitting: Internal Medicine

## 2014-04-24 ENCOUNTER — Other Ambulatory Visit (INDEPENDENT_AMBULATORY_CARE_PROVIDER_SITE_OTHER): Payer: Medicare Other

## 2014-04-24 DIAGNOSIS — E1165 Type 2 diabetes mellitus with hyperglycemia: Principal | ICD-10-CM

## 2014-04-24 DIAGNOSIS — Z0000 Encounter for general adult medical examination without abnormal findings: Secondary | ICD-10-CM

## 2014-04-24 DIAGNOSIS — E1149 Type 2 diabetes mellitus with other diabetic neurological complication: Secondary | ICD-10-CM

## 2014-04-24 DIAGNOSIS — IMO0001 Reserved for inherently not codable concepts without codable children: Secondary | ICD-10-CM

## 2014-04-24 DIAGNOSIS — E1142 Type 2 diabetes mellitus with diabetic polyneuropathy: Secondary | ICD-10-CM

## 2014-04-24 DIAGNOSIS — E1140 Type 2 diabetes mellitus with diabetic neuropathy, unspecified: Secondary | ICD-10-CM

## 2014-04-24 DIAGNOSIS — I10 Essential (primary) hypertension: Secondary | ICD-10-CM

## 2014-04-24 LAB — CBC WITH DIFFERENTIAL/PLATELET
Basophils Absolute: 0 10*3/uL (ref 0.0–0.1)
Basophils Relative: 0.2 % (ref 0.0–3.0)
EOS ABS: 0.3 10*3/uL (ref 0.0–0.7)
Eosinophils Relative: 4.1 % (ref 0.0–5.0)
HCT: 37.4 % (ref 36.0–46.0)
Hemoglobin: 12.2 g/dL (ref 12.0–15.0)
LYMPHS PCT: 24.8 % (ref 12.0–46.0)
Lymphs Abs: 2 10*3/uL (ref 0.7–4.0)
MCHC: 32.6 g/dL (ref 30.0–36.0)
MCV: 80.6 fl (ref 78.0–100.0)
Monocytes Absolute: 0.6 10*3/uL (ref 0.1–1.0)
Monocytes Relative: 7.4 % (ref 3.0–12.0)
NEUTROS PCT: 63.5 % (ref 43.0–77.0)
Neutro Abs: 5.2 10*3/uL (ref 1.4–7.7)
PLATELETS: 236 10*3/uL (ref 150.0–400.0)
RBC: 4.65 Mil/uL (ref 3.87–5.11)
RDW: 17.5 % — AB (ref 11.5–15.5)
WBC: 8.2 10*3/uL (ref 4.0–10.5)

## 2014-04-24 LAB — MICROALBUMIN / CREATININE URINE RATIO
Creatinine,U: 180.6 mg/dL
MICROALB UR: 0.5 mg/dL (ref 0.0–1.9)
Microalb Creat Ratio: 0.3 mg/g (ref 0.0–30.0)

## 2014-04-24 LAB — URINALYSIS, ROUTINE W REFLEX MICROSCOPIC
Bilirubin Urine: NEGATIVE
HGB URINE DIPSTICK: NEGATIVE
Ketones, ur: NEGATIVE
Leukocytes, UA: NEGATIVE
Nitrite: NEGATIVE
PH: 6.5 (ref 5.0–8.0)
SPECIFIC GRAVITY, URINE: 1.02 (ref 1.000–1.030)
TOTAL PROTEIN, URINE-UPE24: NEGATIVE
URINE GLUCOSE: NEGATIVE
Urobilinogen, UA: 0.2 (ref 0.0–1.0)

## 2014-04-24 LAB — HEPATIC FUNCTION PANEL
ALT: 17 U/L (ref 0–35)
AST: 23 U/L (ref 0–37)
Albumin: 3.9 g/dL (ref 3.5–5.2)
Alkaline Phosphatase: 60 U/L (ref 39–117)
BILIRUBIN DIRECT: 0.1 mg/dL (ref 0.0–0.3)
BILIRUBIN TOTAL: 0.8 mg/dL (ref 0.2–1.2)
Total Protein: 6.5 g/dL (ref 6.0–8.3)

## 2014-04-24 LAB — BASIC METABOLIC PANEL
BUN: 10 mg/dL (ref 6–23)
CHLORIDE: 102 meq/L (ref 96–112)
CO2: 31 mEq/L (ref 19–32)
CREATININE: 0.7 mg/dL (ref 0.4–1.2)
Calcium: 9.4 mg/dL (ref 8.4–10.5)
GFR: 100.18 mL/min (ref >60.00)
Glucose, Bld: 89 mg/dL (ref 70–99)
Potassium: 4.1 mEq/L (ref 3.5–5.1)
Sodium: 140 mEq/L (ref 135–145)

## 2014-04-24 LAB — LIPID PANEL
CHOL/HDL RATIO: 3
Cholesterol: 151 mg/dL (ref 0–200)
HDL: 51.7 mg/dL (ref >39.00)
LDL CALC: 82 mg/dL (ref 0–99)
TRIGLYCERIDES: 85 mg/dL (ref 0.0–149.0)
VLDL: 17 mg/dL (ref 0.0–40.0)

## 2014-04-24 LAB — HEMOGLOBIN A1C: HEMOGLOBIN A1C: 6.4 % (ref 4.6–6.5)

## 2014-04-24 LAB — TSH: TSH: 0.34 u[IU]/mL — AB (ref 0.35–4.50)

## 2014-04-29 ENCOUNTER — Ambulatory Visit (INDEPENDENT_AMBULATORY_CARE_PROVIDER_SITE_OTHER): Payer: Medicare Other | Admitting: Internal Medicine

## 2014-04-29 ENCOUNTER — Encounter: Payer: Self-pay | Admitting: Internal Medicine

## 2014-04-29 VITALS — BP 120/70 | HR 73 | Temp 97.3°F | Ht 63.0 in | Wt 161.2 lb

## 2014-04-29 DIAGNOSIS — E1165 Type 2 diabetes mellitus with hyperglycemia: Secondary | ICD-10-CM

## 2014-04-29 DIAGNOSIS — I10 Essential (primary) hypertension: Secondary | ICD-10-CM

## 2014-04-29 DIAGNOSIS — E1140 Type 2 diabetes mellitus with diabetic neuropathy, unspecified: Secondary | ICD-10-CM

## 2014-04-29 DIAGNOSIS — E1149 Type 2 diabetes mellitus with other diabetic neurological complication: Secondary | ICD-10-CM

## 2014-04-29 DIAGNOSIS — IMO0001 Reserved for inherently not codable concepts without codable children: Secondary | ICD-10-CM

## 2014-04-29 DIAGNOSIS — E1142 Type 2 diabetes mellitus with diabetic polyneuropathy: Secondary | ICD-10-CM

## 2014-04-29 MED ORDER — ONETOUCH ULTRA MINI W/DEVICE KIT: PACK | Status: DC

## 2014-04-29 MED ORDER — ONETOUCH LANCETS MISC: Status: DC

## 2014-04-29 MED ORDER — GLUCOSE BLOOD VI STRP: ORAL_STRIP | Status: DC

## 2014-04-29 NOTE — Progress Notes (Signed)
Subjective:    Patient ID: [name] [name], female    DOB: [birth year], 68 y.o.   MRN: [id number]  HPI  Here to f/u; overall doing ok,  Pt denies chest pain, increased sob or doe, wheezing, orthopnea, PND, increased LE swelling, palpitations, dizziness or syncope.  Pt denies polydipsia, polyuria, or low sugar symptoms such as weakness or confusion improved with po intake.  Pt denies new neurological symptoms such as new headache, or facial or extremity weakness or numbness.   Pt states overall good compliance with meds, has been trying to follow lower cholesterol, diabetic diet, with wt overall stable,  but little exercise however. Past Medical History  Diagnosis Date  . Psoriasis   . Eczema   . DIABETES MELLITUS 01/30/2008  . GOITER, MULTINODULAR 05/21/2010  . GLUCOSE INTOLERANCE 09/03/2007  . HYPERCHOLESTEROLEMIA 09/03/2007  . HYPERTENSION 09/03/2007  . CORONARY ARTERY DISEASE 09/03/2007  . ASTHMA 09/03/2007  . COPD 09/03/2007  . GERD 09/03/2007  . OBESITY 09/03/2007  . DEGENERATIVE JOINT DISEASE 01/30/2008  . ARTHRITIS 01/30/2008  . COLON CANCER 04/07/2008    pt states she had colon cancer 1987  . LVH (left ventricular hypertrophy)   . Osteopenia   . Impaired glucose tolerance 04/10/2011  . COPD (chronic obstructive pulmonary disease) 04/22/2013  . Diabetic peripheral neuropathy 04/22/2013   Past Surgical History  Procedure Laterality Date  . Abdominal hysterectomy    . Ptca    . Hernia repair    . Tubal ligation    . Hemicolectomy  12/1985    rt    reports that she quit smoking about 35 years ago. Her smoking use included Cigarettes. She has a 15 pack-year smoking history. She has never used smokeless tobacco. She reports that she does not drink alcohol or use illicit drugs. family history includes Cancer in her other; Colon cancer in her other and paternal aunt; Heart disease in her mother; Liver disease in her father; Thyroid disease in her brother and sister. Allergies  Allergen  Reactions  . Simvastatin    Current Outpatient Prescriptions on File Prior to Visit  Medication Sig Dispense Refill  . acyclovir cream (ZOVIRAX) 5 % Apply 1 application topically as needed.      Marland Kitchen albuterol (PROVENTIL HFA;VENTOLIN HFA) 108 (90 BASE) MCG/ACT inhaler Inhale 2 puffs into the lungs every 6 (six) hours as needed. Please put in patient file until she is ready for refill thanks  1 Inhaler  6  . amLODipine (NORVASC) 5 MG tablet TAKE 1 TABLET (5 MG TOTAL) BY MOUTH DAILY.  30 tablet  11  . aspirin 325 MG EC tablet Take 325 mg by mouth daily.        . clobetasol (TEMOVATE) 0.05 % ointment Use twice once daily to hand and feet as needed  30 g  1  . fluocinolone (SYNALAR) 0.025 % ointment Apply topically 2 (two) times daily as needed.  30 g  1  . Fluticasone-Salmeterol (ADVAIR DISKUS) 250-50 MCG/DOSE AEPB INHALE 1 PUFF INTO THE LUNGS 2 (TWO) TIMES DAILY.  60 each  5  . hydrochlorothiazide (HYDRODIURIL) 25 MG tablet TAKE 1 TABLET EVERY DAY  31 tablet  11  . ketoconazole (NIZORAL) 2 % cream Apply topically 3 (three) times daily. use as directed two times a day as needed under breast  15 g  3  . Lancets (FREESTYLE) lancets USE AS DIRECTED  100 each  11  . loratadine-pseudoephedrine (CLARITIN-D 24-HOUR) 10-240 MG per 24 hr tablet Take 1  tablet by mouth daily.      Marland Kitchen lovastatin (MEVACOR) 40 MG tablet TAKE 2 TABLETS EVERY DAY  60 tablet  9  . metFORMIN (GLUCOPHAGE-XR) 500 MG 24 hr tablet TAKE 2 TABLETS EVERY DAY  180 tablet  3  . metoprolol tartrate (LOPRESSOR) 25 MG tablet TAKE 1 & 1/2 TABLETS TWICE A DAY  90 tablet  11  . naproxen (NAPROSYN) 500 MG tablet TAKE 1 TABLET TWICE A DAY AS NEEDED  60 tablet  9  . omeprazole (PRILOSEC) 20 MG capsule TAKE ONE CAPSULE EVERY DAY  90 capsule  3  . traMADol (ULTRAM) 50 MG tablet TAKE 1 TABLET EVERY 6 HOURS AS NEEDED FOR PAIN  120 tablet  3  . desonide (DESOWEN) 0.05 % lotion Apply topically. Use under breast two times a day as needed        No current  facility-administered medications on file prior to visit.    Review of Systems  Constitutional: Negative for unusual diaphoresis or other sweats  HENT: Negative for ringing in ear Eyes: Negative for double vision or worsening visual disturbance.  Respiratory: Negative for choking and stridor.   Gastrointestinal: Negative for vomiting or other signifcant bowel change Genitourinary: Negative for hematuria or decreased urine volume.  Musculoskeletal: Negative for other MSK pain or swelling Skin: Negative for color change and worsening wound.  Neurological: Negative for tremors and numbness other than noted  Psychiatric/Behavioral: Negative for decreased concentration or agitation other than above       Objective:   Physical Exam BP 120/70  Pulse 73  Temp(Src) 97.3 F (36.3 C) (Oral)  Ht 5\' 3"  (1.6 m)  Wt 161 lb 4 oz (73.143 kg)  BMI 28.57 kg/m2  SpO2 93% VS noted,  Constitutional: Pt appears well-developed, well-nourished.  HENT: Head: NCAT.  Right Ear: External ear normal.  Left Ear: External ear normal.  Eyes: . Pupils are equal, round, and reactive to light. Conjunctivae and EOM are normal Neck: Normal range of motion. Neck supple.  Cardiovascular: Normal rate and regular rhythm.   Pulmonary/Chest: Effort normal and breath sounds normal.  Abd:  Soft, NT, ND, + BS Neurological: Pt is alert. Not confused , motor grossly intact Skin: Skin is warm. No rash Psychiatric: Pt behavior is normal. No agitation. mild nervous    Assessment & Plan:

## 2014-04-29 NOTE — Addendum Note (Signed)
Addended by: Sharon Seller B on: 04/29/2014 11:49 AM   Modules accepted: Orders

## 2014-04-29 NOTE — Patient Instructions (Signed)
Please continue all other medications as before, and refills have been done if requested. Please have the pharmacy call with any other refills you may need.  Please continue your efforts at being more active, low cholesterol diet, and weight control.  Please return in 6 months, or sooner if needed, with Lab testing done 3-5 days before, for your yearly physical

## 2014-04-29 NOTE — Progress Notes (Signed)
Pre visit review using our clinic review tool, if applicable. No additional management support is needed unless otherwise documented below in the visit note. 

## 2014-04-29 NOTE — Assessment & Plan Note (Signed)
stable overall by history and exam, recent data reviewed with pt, and pt to continue medical treatment as before,  to f/u any worsening symptoms or concerns Lab Results  Component Value Date   HGBA1C 6.4 04/24/2014   Cont tramadol prn for pain

## 2014-04-29 NOTE — Assessment & Plan Note (Signed)
stable overall by history and exam, recent data reviewed with pt, and pt to continue medical treatment as before,  to f/u any worsening symptoms or concerns BP Readings from Last 3 Encounters:  04/29/14 120/70  01/09/14 112/70  12/23/13 120/74

## 2014-06-17 ENCOUNTER — Ambulatory Visit (INDEPENDENT_AMBULATORY_CARE_PROVIDER_SITE_OTHER): Payer: Medicare Other | Admitting: Pulmonary Disease

## 2014-06-17 ENCOUNTER — Encounter: Payer: Self-pay | Admitting: Pulmonary Disease

## 2014-06-17 VITALS — BP 120/70 | HR 73 | Temp 98.3°F | Ht 63.0 in | Wt 159.6 lb

## 2014-06-17 DIAGNOSIS — J45909 Unspecified asthma, uncomplicated: Secondary | ICD-10-CM

## 2014-06-17 MED ORDER — FLUTICASONE-SALMETEROL 250-50 MCG/DOSE IN AEPB: INHALATION_SPRAY | RESPIRATORY_TRACT | Status: DC

## 2014-06-17 NOTE — Progress Notes (Signed)
   Subjective:    Patient ID: [name] [name], female    DOB: [birth year], 68 y.o.   MRN: [id number]  HPI  68 yo AAF, ex smoker with asthmatic bronchitis, GERD and a component of VCD.  Worse during spring, fall & extreme heat. On xopenex MDI due to after taste & palpitations with albuterol   12/26/2011 increased advair to 250/50  12/14/2012 Spirometry >> FEv1 84%    06/17/2014  Chief Complaint  Patient presents with  . Follow-up    Reports breathing is fair. C/o slight dry cough, slight wheezing/chest tx depending on the weather. She uses albuterol inhaler once daily.     Remains on Advair , mainly takes once daily.  Has occasional cough with thick white mucus.  Patient says that overall she is doing well without any flare in cough or wheezing. Daily SABA use.  No ER hospital visits since last OV .  Patient denies any hemoptysis, orthopnea, PND, or leg swelling.    Review of Systems neg for any significant sore throat, dysphagia, itching, sneezing, nasal congestion or excess/ purulent secretions, fever, chills, sweats, unintended wt loss, pleuritic or exertional cp, hempoptysis, orthopnea pnd or change in chronic leg swelling. Also denies presyncope, palpitations, heartburn, abdominal pain, nausea, vomiting, diarrhea or change in bowel or urinary habits, dysuria,hematuria, rash, arthralgias, visual complaints, headache, numbness weakness or ataxia.     Objective:   Physical Exam  Gen. Pleasant, well-nourished, in no distress ENT - no lesions, no post nasal drip Neck: No JVD, no thyromegaly, no carotid bruits Lungs: no use of accessory muscles, no dullness to percussion, clear without rales or rhonchi  Cardiovascular: Rhythm regular, heart sounds  normal, no murmurs or gallops, no peripheral edema Musculoskeletal: No deformities, no cyanosis or clubbing         Assessment & Plan:

## 2014-06-17 NOTE — Patient Instructions (Signed)
Refills on prilosec Take advair daily Use albuterol as needed

## 2014-06-17 NOTE — Assessment & Plan Note (Signed)
Refills on prilosec Take advair daily Use albuterol as needed OK to take claritin daily

## 2014-06-17 NOTE — Addendum Note (Signed)
Addended by: Mathis Bud on: 06/17/2014 11:24 AM   Modules accepted: Orders

## 2014-06-24 ENCOUNTER — Other Ambulatory Visit: Payer: Self-pay | Admitting: Internal Medicine

## 2014-07-21 ENCOUNTER — Other Ambulatory Visit: Payer: Self-pay | Admitting: Internal Medicine

## 2014-07-22 MED ORDER — TRAMADOL HCL 50 MG PO TABS: ORAL_TABLET | ORAL | Status: DC

## 2014-07-22 NOTE — Telephone Encounter (Signed)
Done hardcopy to robin  

## 2014-07-22 NOTE — Telephone Encounter (Signed)
Faxed hardcopy to [facility]

## 2014-08-19 ENCOUNTER — Other Ambulatory Visit: Payer: Self-pay | Admitting: Adult Health

## 2014-09-10 ENCOUNTER — Ambulatory Visit: Payer: Medicare Other | Admitting: Internal Medicine

## 2014-09-11 ENCOUNTER — Ambulatory Visit: Payer: Medicare Other | Admitting: Nurse Practitioner

## 2014-09-22 ENCOUNTER — Telehealth: Payer: Self-pay

## 2014-09-22 NOTE — Telephone Encounter (Signed)
Pt has not had an eye exam in "several" years.  She has an ophthalmologist and plans to schedule an appointment before the end of this year or beginning of next year.

## 2014-09-24 ENCOUNTER — Other Ambulatory Visit: Payer: Self-pay

## 2014-09-24 MED ORDER — NAPROXEN 500 MG PO TABS: 500.0000 mg | ORAL_TABLET | Freq: Two times a day (BID) | ORAL | Status: DC

## 2014-10-24 ENCOUNTER — Other Ambulatory Visit (INDEPENDENT_AMBULATORY_CARE_PROVIDER_SITE_OTHER): Payer: Medicare Other

## 2014-10-24 DIAGNOSIS — IMO0002 Reserved for concepts with insufficient information to code with codable children: Secondary | ICD-10-CM

## 2014-10-24 DIAGNOSIS — E1165 Type 2 diabetes mellitus with hyperglycemia: Secondary | ICD-10-CM

## 2014-10-24 LAB — BASIC METABOLIC PANEL
BUN: 11 mg/dL (ref 6–23)
CO2: 28 meq/L (ref 19–32)
CREATININE: 0.7 mg/dL (ref 0.4–1.2)
Calcium: 9 mg/dL (ref 8.4–10.5)
Chloride: 104 mEq/L (ref 96–112)
GFR: 100.03 mL/min (ref >60.00)
Glucose, Bld: 112 mg/dL — ABNORMAL HIGH (ref 70–99)
Potassium: 3.9 mEq/L (ref 3.5–5.1)
Sodium: 139 mEq/L (ref 135–145)

## 2014-10-24 LAB — HEMOGLOBIN A1C: HEMOGLOBIN A1C: 6.3 % (ref 4.6–6.5)

## 2014-10-24 LAB — LIPID PANEL
CHOL/HDL RATIO: 3
Cholesterol: 129 mg/dL (ref 0–200)
HDL: 40.2 mg/dL (ref >39.00)
LDL CALC: 73 mg/dL (ref 0–99)
NONHDL: 88.8
Triglycerides: 78 mg/dL (ref 0.0–149.0)
VLDL: 15.6 mg/dL (ref 0.0–40.0)

## 2014-10-24 LAB — HEPATIC FUNCTION PANEL
ALT: 17 U/L (ref 0–35)
AST: 21 U/L (ref 0–37)
Albumin: 3.1 g/dL — ABNORMAL LOW (ref 3.5–5.2)
Alkaline Phosphatase: 67 U/L (ref 39–117)
BILIRUBIN DIRECT: 0.1 mg/dL (ref 0.0–0.3)
TOTAL PROTEIN: 6.4 g/dL (ref 6.0–8.3)
Total Bilirubin: 0.8 mg/dL (ref 0.2–1.2)

## 2014-10-31 ENCOUNTER — Ambulatory Visit (INDEPENDENT_AMBULATORY_CARE_PROVIDER_SITE_OTHER): Payer: Medicare Other | Admitting: Internal Medicine

## 2014-10-31 ENCOUNTER — Encounter: Payer: Self-pay | Admitting: Internal Medicine

## 2014-10-31 VITALS — BP 112/78 | HR 75 | Temp 98.4°F | Ht 63.0 in | Wt 152.1 lb

## 2014-10-31 DIAGNOSIS — M545 Low back pain, unspecified: Secondary | ICD-10-CM | POA: Insufficient documentation

## 2014-10-31 DIAGNOSIS — Z0000 Encounter for general adult medical examination without abnormal findings: Secondary | ICD-10-CM

## 2014-10-31 DIAGNOSIS — Z23 Encounter for immunization: Secondary | ICD-10-CM

## 2014-10-31 DIAGNOSIS — E118 Type 2 diabetes mellitus with unspecified complications: Secondary | ICD-10-CM

## 2014-10-31 MED ORDER — TIZANIDINE HCL 4 MG PO TABS: 4.0000 mg | ORAL_TABLET | Freq: Four times a day (QID) | ORAL | Status: DC | PRN

## 2014-10-31 MED ORDER — ONETOUCH ULTRA SYSTEM W/DEVICE KIT: 1.0000 | PACK | Freq: Once | Status: AC

## 2014-10-31 NOTE — Patient Instructions (Addendum)
You had the new Prevnar pneumonia shot today  Please take all new medication as prescribed - the muscle relaxer for the lower back  Please continue all other medications as before, and refills have been done if requested.  Please have the pharmacy call with any other refills you may need.  Please continue your efforts at being more active, low cholesterol diet, and weight control.  You are otherwise up to date with prevention measures today.  Please keep your appointments with your specialists as you may have planned  Your Lab tests were good today  Please return in 6 months, or sooner if needed, with Lab testing done 3-5 days before

## 2014-10-31 NOTE — Progress Notes (Signed)
Pre visit review using our clinic review tool, if applicable. No additional management support is needed unless otherwise documented below in the visit note. 

## 2014-10-31 NOTE — Assessment & Plan Note (Signed)

## 2014-10-31 NOTE — Progress Notes (Signed)
Subjective:    Patient ID: [name] [name], female    DOB: 13-Mar-1946, 68 y.o.   MRN: [id number]  HPI  Here for wellness and f/u;  Overall doing ok;  Pt denies CP, worsening SOB, DOE, wheezing, orthopnea, PND, worsening LE edema, palpitations, dizziness or syncope.  Pt denies neurological change such as new headache, facial or extremity weakness.  Pt denies polydipsia, polyuria, or low sugar symptoms. Pt states overall good compliance with treatment and medications, good tolerability, and has been trying to follow lower cholesterol diet.  Pt denies worsening depressive symptoms, suicidal ideation or panic. No fever, night sweats, wt loss, loss of appetite, or other constitutional symptoms.  Pt states good ability with ADL's, has low fall risk, home safety reviewed and adequate, no other significant changes in hearing or vision, and only occasionally active with exercise. Has lost 9 lbs in the past 6 mo, had been peak wt 187 x 6-7 yrs, most of it intentional, although has had mild lower appetite.  Non smoker > 20 yrs. Pt continues to have recurring left LBP without change in severity since pulled back putting groceries in car 3 days ago, better some with advil but not much, and no bowel or bladder change, fever, wt loss,  worsening LE pain/numbness/weakness, gait change or falls. Past Medical History  Diagnosis Date  . Psoriasis   . Eczema   . DIABETES MELLITUS 01/30/2008  . GOITER, MULTINODULAR 05/21/2010  . GLUCOSE INTOLERANCE 09/03/2007  . HYPERCHOLESTEROLEMIA 09/03/2007  . HYPERTENSION 09/03/2007  . CORONARY ARTERY DISEASE 09/03/2007  . ASTHMA 09/03/2007  . COPD 09/03/2007  . GERD 09/03/2007  . OBESITY 09/03/2007  . DEGENERATIVE JOINT DISEASE 01/30/2008  . ARTHRITIS 01/30/2008  . COLON CANCER 04/07/2008    pt states she had colon cancer 1987  . LVH (left ventricular hypertrophy)   . Osteopenia   . Impaired glucose tolerance 04/10/2011  . COPD (chronic obstructive pulmonary disease) 04/22/2013  .  Diabetic peripheral neuropathy 04/22/2013   Past Surgical History  Procedure Laterality Date  . Abdominal hysterectomy    . Ptca    . Hernia repair    . Tubal ligation    . Hemicolectomy  12/1985    rt    reports that she quit smoking about 35 years ago. Her smoking use included Cigarettes. She has a 15 pack-year smoking history. She has never used smokeless tobacco. She reports that she does not drink alcohol or use illicit drugs. family history includes Cancer in her other; Colon cancer in her other and paternal aunt; Heart disease in her mother; Liver disease in her father; Thyroid disease in her brother and sister. Allergies  Allergen Reactions  . Simvastatin    Current Outpatient Prescriptions on File Prior to Visit  Medication Sig Dispense Refill  . acyclovir cream (ZOVIRAX) 5 % Apply 1 application topically as needed.    Marland Kitchen ADVAIR DISKUS 250-50 MCG/DOSE AEPB INHALE 1 DOSE BY MOUTH TWICE DAILY. RINSE MOUTH AFTER USE 60 each 5  . albuterol (PROVENTIL HFA;VENTOLIN HFA) 108 (90 BASE) MCG/ACT inhaler Inhale 2 puffs into the lungs every 6 (six) hours as needed. Please put in patient file until she is ready for refill thanks 1 Inhaler 6  . amLODipine (NORVASC) 5 MG tablet TAKE 1 TABLET (5 MG TOTAL) BY MOUTH DAILY. 30 tablet 11  . aspirin 325 MG EC tablet Take 325 mg by mouth daily.      . clobetasol (TEMOVATE) 0.05 % ointment Use twice once daily to  hand and feet as needed 30 g 1  . fluocinolone (SYNALAR) 0.025 % ointment Apply topically 2 (two) times daily as needed. 30 g 1  . Fluticasone-Salmeterol (ADVAIR DISKUS) 250-50 MCG/DOSE AEPB INHALE 1 PUFF INTO THE LUNGS 2 (TWO) TIMES DAILY. 60 each 0  . glucose blood (ONE TOUCH ULTRA TEST) test strip Use as directed once daily to check blood sugar. Diagnosis code 250.60 100 each 11  . hydrochlorothiazide (HYDRODIURIL) 25 MG tablet TAKE 1 TABLET EVERY DAY 31 tablet 11  . ketoconazole (NIZORAL) 2 % cream Apply topically 3 (three) times daily. use  as directed two times a day as needed under breast 15 g 3  . Lancets (FREESTYLE) lancets USE AS DIRECTED 100 each 11  . loratadine-pseudoephedrine (CLARITIN-D 24-HOUR) 10-240 MG per 24 hr tablet Take 1 tablet by mouth daily.    Marland Kitchen lovastatin (MEVACOR) 40 MG tablet TAKE 2 TABLETS EVERY DAY 60 tablet 9  . metFORMIN (GLUCOPHAGE-XR) 500 MG 24 hr tablet TAKE 2 TABLETS EVERY DAY 180 tablet 3  . metoprolol tartrate (LOPRESSOR) 25 MG tablet TAKE 1 & 1/2 TABLETS TWICE A DAY 90 tablet 11  . naproxen (NAPROSYN) 500 MG tablet Take 1 tablet (500 mg total) by mouth 2 (two) times daily with a meal. 60 tablet 9  . omeprazole (PRILOSEC) 20 MG capsule TAKE ONE CAPSULE EVERY DAY 90 capsule 3  . ONE TOUCH LANCETS MISC Use as directed once daily to check blood sugar.  Diagnosis code 250.60 200 each 11  . traMADol (ULTRAM) 50 MG tablet TAKE 1 TABLET EVERY 6 HOURS AS NEEDED FOR PAIN 120 tablet 3   No current facility-administered medications on file prior to visit.   Review of Systems Constitutional: Negative for increased diaphoresis, other activity, appetite or other siginficant weight change  HENT: Negative for worsening hearing loss, ear pain, facial swelling, mouth sores and neck stiffness.   Eyes: Negative for other worsening pain, redness or visual disturbance.  Respiratory: Negative for shortness of breath and wheezing.   Cardiovascular: Negative for chest pain and palpitations.  Gastrointestinal: Negative for diarrhea, blood in stool, abdominal distention or other pain Genitourinary: Negative for hematuria, flank pain or change in urine volume.  Musculoskeletal: Negative for myalgias or other joint complaints.  Skin: Negative for color change and wound.  Neurological: Negative for syncope and numbness. other than noted Hematological: Negative for adenopathy. or other swelling Psychiatric/Behavioral: Negative for hallucinations, self-injury, decreased concentration or other worsening agitation.        Objective:   Physical Exam BP 112/78 mmHg  Pulse 75  Temp(Src) 98.4 F (36.9 C) (Oral)  Ht 5\' 3"  (1.6 m)  Wt 152 lb 2 oz (69.003 kg)  BMI 26.95 kg/m2  SpO2 95% VS noted,  Constitutional: Pt is oriented to person, place, and time. Appears well-developed and well-nourished.  Head: Normocephalic and atraumatic.  Right Ear: External ear normal.  Left Ear: External ear normal.  Nose: Nose normal.  Mouth/Throat: Oropharynx is clear and moist.  Eyes: Conjunctivae and EOM are normal. Pupils are equal, round, and reactive to light.  Neck: Normal range of motion. Neck supple. No JVD present. No tracheal deviation present.  Cardiovascular: Normal rate, regular rhythm, normal heart sounds and intact distal pulses.   Pulmonary/Chest: Effort normal and breath sounds without rales or wheezing  Abdominal: Soft. Bowel sounds are normal. NT. No HSM  Musculoskeletal: Normal range of motion. Exhibits no edema.  Lymphadenopathy:  Has no cervical adenopathy.  Neurological: Pt is alert and oriented  to person, place, and time. Pt has normal reflexes. No cranial nerve deficit. Motor intact 5/5, dtrs ok, has decr sens to LT to toes c/w neuropathy Skin: Skin is warm and dry. No rash noted.  Psychiatric:  Has normal mood and affect. Behavior is normal.  Wt Readings from Last 3 Encounters:  10/31/14 152 lb 2 oz (69.003 kg)  06/17/14 159 lb 9.6 oz (72.394 kg)  04/29/14 161 lb 4 oz (73.143 kg)  has right lumbar paravertebral spasm tenderness    Assessment & Plan:

## 2014-10-31 NOTE — Addendum Note (Signed)
Addended by: Sharon Seller B on: 10/31/2014 10:48 AM   Modules accepted: Orders

## 2014-10-31 NOTE — Assessment & Plan Note (Signed)
C/w msk strain, for tizanidine prn,  to f/u any worsening symptoms or concerns

## 2014-12-16 ENCOUNTER — Encounter: Payer: Self-pay | Admitting: Adult Health

## 2014-12-16 ENCOUNTER — Other Ambulatory Visit: Payer: Self-pay | Admitting: Internal Medicine

## 2014-12-16 ENCOUNTER — Ambulatory Visit (INDEPENDENT_AMBULATORY_CARE_PROVIDER_SITE_OTHER): Payer: Medicare Other | Admitting: Adult Health

## 2014-12-16 VITALS — BP 112/64 | HR 66 | Temp 98.1°F | Ht 63.0 in | Wt 150.4 lb

## 2014-12-16 DIAGNOSIS — J4530 Mild persistent asthma, uncomplicated: Secondary | ICD-10-CM

## 2014-12-16 DIAGNOSIS — J309 Allergic rhinitis, unspecified: Secondary | ICD-10-CM

## 2014-12-16 NOTE — Progress Notes (Signed)
   Subjective:    Patient ID: [name] [name], female    DOB: 12-22-1945, 69 y.o.   MRN: [id number]  HPI  69 yo AAF, ex smoker with asthmatic bronchitis, GERD and a component of VCD.  Worse during spring, fall & extreme heat. On xopenex MDI due to after taste & palpitations with albuterol   12/26/2011 increased advair to 250/50  12/14/2012 Spirometry >> FEv1 84%    12/16/2014 Follow up Asthma   Returns for follow-up of asthma. Overall feels that her asthma is doing well with no significant flares. Does feel with the weather changes that she gets more symptoms. She denies any chest pain, orthopnea, PND, increased rescue inhaler use hospitalizations or emergency room visits.   Review of Systems neg for any significant sore throat, dysphagia, itching, sneezing, nasal congestion or excess/ purulent secretions, fever, chills, sweats, unintended wt loss, pleuritic or exertional cp, hempoptysis, orthopnea pnd or change in chronic leg swelling. Also denies presyncope, palpitations, heartburn, abdominal pain, nausea, vomiting, diarrhea or change in bowel or urinary habits, dysuria,hematuria, rash, arthralgias, visual complaints, headache, numbness weakness or ataxia.     Objective:   Physical Exam  Gen. Pleasant, well-nourished, in no distress ENT - no lesions, no post nasal drip Neck: No JVD, no thyromegaly, no carotid bruits Lungs: no use of accessory muscles, no dullness to percussion, clear without rales or rhonchi  Cardiovascular: Rhythm regular, heart sounds  normal, no murmurs or gallops, no peripheral edema Musculoskeletal: No deformities, no cyanosis or clubbing         Assessment & Plan:

## 2014-12-16 NOTE — Patient Instructions (Signed)
Continue on Advair .  follow up Dr. Elsworth Soho  In 1 year and As needed

## 2014-12-18 ENCOUNTER — Ambulatory Visit: Payer: Medicare Other | Admitting: Adult Health

## 2014-12-23 ENCOUNTER — Other Ambulatory Visit: Payer: Self-pay | Admitting: Internal Medicine

## 2014-12-30 NOTE — Assessment & Plan Note (Signed)
Compensated without flare. Plan continue on current regimen

## 2014-12-30 NOTE — Assessment & Plan Note (Signed)
Compensated   Plan  Continue on Advair .  follow up Dr. Elsworth Soho  In 1 year and As needed

## 2015-02-18 ENCOUNTER — Other Ambulatory Visit: Payer: Self-pay | Admitting: Internal Medicine

## 2015-02-18 NOTE — Telephone Encounter (Signed)
done

## 2015-02-18 NOTE — Telephone Encounter (Signed)
Done hardcopy to Cherina  

## 2015-03-12 ENCOUNTER — Other Ambulatory Visit: Payer: Self-pay | Admitting: Internal Medicine

## 2015-03-12 DIAGNOSIS — Z1231 Encounter for screening mammogram for malignant neoplasm of breast: Secondary | ICD-10-CM

## 2015-03-20 ENCOUNTER — Ambulatory Visit (HOSPITAL_COMMUNITY)
Admission: RE | Admit: 2015-03-20 | Discharge: 2015-03-20 | Disposition: A | Discharge status: Home / Self Care | Payer: Medicare Other | Source: Ambulatory Visit | Attending: Internal Medicine | Admitting: Internal Medicine

## 2015-03-20 DIAGNOSIS — Z1231 Encounter for screening mammogram for malignant neoplasm of breast: Secondary | ICD-10-CM | POA: Insufficient documentation

## 2015-04-20 ENCOUNTER — Other Ambulatory Visit: Payer: Self-pay | Admitting: Internal Medicine

## 2015-04-22 ENCOUNTER — Encounter: Payer: Self-pay | Admitting: Internal Medicine

## 2015-04-22 ENCOUNTER — Other Ambulatory Visit (INDEPENDENT_AMBULATORY_CARE_PROVIDER_SITE_OTHER): Payer: Medicare Other

## 2015-04-22 ENCOUNTER — Ambulatory Visit (INDEPENDENT_AMBULATORY_CARE_PROVIDER_SITE_OTHER): Payer: Medicare Other | Admitting: Internal Medicine

## 2015-04-22 ENCOUNTER — Ambulatory Visit (INDEPENDENT_AMBULATORY_CARE_PROVIDER_SITE_OTHER)
Admission: RE | Admit: 2015-04-22 | Discharge: 2015-04-22 | Disposition: A | Discharge status: Home / Self Care | Payer: Medicare Other | Source: Ambulatory Visit | Attending: Internal Medicine | Admitting: Internal Medicine

## 2015-04-22 VITALS — BP 110/60 | HR 112 | Temp 99.1°F | Wt 140.1 lb

## 2015-04-22 DIAGNOSIS — I10 Essential (primary) hypertension: Secondary | ICD-10-CM

## 2015-04-22 DIAGNOSIS — E118 Type 2 diabetes mellitus with unspecified complications: Secondary | ICD-10-CM

## 2015-04-22 DIAGNOSIS — R509 Fever, unspecified: Secondary | ICD-10-CM

## 2015-04-22 DIAGNOSIS — Z0000 Encounter for general adult medical examination without abnormal findings: Secondary | ICD-10-CM | POA: Diagnosis not present

## 2015-04-22 DIAGNOSIS — M545 Low back pain, unspecified: Secondary | ICD-10-CM

## 2015-04-22 DIAGNOSIS — Z79899 Other long term (current) drug therapy: Secondary | ICD-10-CM

## 2015-04-22 DIAGNOSIS — E780 Pure hypercholesterolemia: Secondary | ICD-10-CM

## 2015-04-22 DIAGNOSIS — R300 Dysuria: Secondary | ICD-10-CM

## 2015-04-22 LAB — URINALYSIS, ROUTINE W REFLEX MICROSCOPIC
KETONES UR: NEGATIVE
Nitrite: NEGATIVE
PH: 6 (ref 5.0–8.0)
Specific Gravity, Urine: 1.015 (ref 1.000–1.030)
Total Protein, Urine: 30 — AB
UROBILINOGEN UA: 0.2 (ref 0.0–1.0)
Urine Glucose: NEGATIVE

## 2015-04-22 LAB — HEPATIC FUNCTION PANEL
ALK PHOS: 75 U/L (ref 39–117)
ALT: 11 U/L (ref 0–35)
AST: 15 U/L (ref 0–37)
Albumin: 3.7 g/dL (ref 3.5–5.2)
BILIRUBIN DIRECT: 0.1 mg/dL (ref 0.0–0.3)
TOTAL PROTEIN: 7.4 g/dL (ref 6.0–8.3)
Total Bilirubin: 0.6 mg/dL (ref 0.2–1.2)

## 2015-04-22 LAB — BASIC METABOLIC PANEL
BUN: 10 mg/dL (ref 6–23)
CO2: 30 meq/L (ref 19–32)
Calcium: 9.6 mg/dL (ref 8.4–10.5)
Chloride: 95 mEq/L — ABNORMAL LOW (ref 96–112)
Creatinine, Ser: 0.7 mg/dL (ref 0.40–1.20)
GFR: 106.5 mL/min (ref >60.00)
GLUCOSE: 142 mg/dL — AB (ref 70–99)
POTASSIUM: 3.1 meq/L — AB (ref 3.5–5.1)
Sodium: 134 mEq/L — ABNORMAL LOW (ref 135–145)

## 2015-04-22 LAB — CBC WITH DIFFERENTIAL/PLATELET
BASOS PCT: 0.5 % (ref 0.0–3.0)
Basophils Absolute: 0.1 10*3/uL (ref 0.0–0.1)
EOS ABS: 0 10*3/uL (ref 0.0–0.7)
Eosinophils Relative: 0.1 % (ref 0.0–5.0)
HCT: 39.8 % (ref 36.0–46.0)
Hemoglobin: 13.2 g/dL (ref 12.0–15.0)
Lymphocytes Relative: 16.3 % (ref 12.0–46.0)
Lymphs Abs: 1.8 10*3/uL (ref 0.7–4.0)
MCHC: 33.1 g/dL (ref 30.0–36.0)
MCV: 74.6 fl — AB (ref 78.0–100.0)
MONO ABS: 1 10*3/uL (ref 0.1–1.0)
Monocytes Relative: 8.9 % (ref 3.0–12.0)
NEUTROS PCT: 74.2 % (ref 43.0–77.0)
Neutro Abs: 8 10*3/uL — ABNORMAL HIGH (ref 1.4–7.7)
Platelets: 225 10*3/uL (ref 150.0–400.0)
RBC: 5.34 Mil/uL — ABNORMAL HIGH (ref 3.87–5.11)
RDW: 17.7 % — AB (ref 11.5–15.5)
WBC: 10.8 10*3/uL — ABNORMAL HIGH (ref 4.0–10.5)

## 2015-04-22 LAB — MICROALBUMIN / CREATININE URINE RATIO
Creatinine,U: 210.5 mg/dL
MICROALB UR: 13 mg/dL — AB (ref 0.0–1.9)
Microalb Creat Ratio: 6.2 mg/g (ref 0.0–30.0)

## 2015-04-22 LAB — LIPID PANEL
CHOL/HDL RATIO: 3
Cholesterol: 152 mg/dL (ref 0–200)
HDL: 45.5 mg/dL (ref >39.00)
LDL Cholesterol: 78 mg/dL (ref 0–99)
NONHDL: 106.5
TRIGLYCERIDES: 142 mg/dL (ref 0.0–149.0)
VLDL: 28.4 mg/dL (ref 0.0–40.0)

## 2015-04-22 LAB — TSH: TSH: 0.04 u[IU]/mL — ABNORMAL LOW (ref 0.35–4.50)

## 2015-04-22 LAB — HEMOGLOBIN A1C: Hgb A1c MFr Bld: 6.3 % (ref 4.6–6.5)

## 2015-04-22 MED ORDER — CEPHALEXIN 500 MG PO CAPS: 500.0000 mg | ORAL_CAPSULE | Freq: Four times a day (QID) | ORAL | Status: DC

## 2015-04-22 MED ORDER — CEFTRIAXONE SODIUM 1 G IJ SOLR
1.0000 g | Freq: Once | INTRAMUSCULAR | Status: AC
  Administered 2015-04-22: 1 g via INTRAMUSCULAR

## 2015-04-22 MED ORDER — ONDANSETRON HCL 4 MG PO TABS: 4.0000 mg | ORAL_TABLET | Freq: Three times a day (TID) | ORAL | Status: DC | PRN

## 2015-04-22 NOTE — Patient Instructions (Signed)
You had the antibiotic shot today  Please take all new medication as prescribed - the antibiotic pills  Please continue all other medications as before, and refills have been done if requested.  Please have the pharmacy call with any other refills you may need.  Please keep your appointments with your specialists as you may have planned  Please go to the XRAY Department in the Basement (go straight as you get off the elevator) for the x-ray testing  Please go to the LAB in the Basement (turn left off the elevator) for the tests to be done today  You will be contacted by phone if any changes need to be made immediately.  Otherwise, you will receive a letter about your results with an explanation, but please check with MyChart first.  Please remember to sign up for MyChart if you have not done so, as this will be important to you in the future with finding out test results, communicating by private email, and scheduling acute appointments online when needed.

## 2015-04-22 NOTE — Assessment & Plan Note (Signed)
Exam most c/w prob uti vs early pyelonephritis, also for cxr given temp 103 earilier

## 2015-04-22 NOTE — Assessment & Plan Note (Signed)
prob UTI - for urine studies today, rocephin IM, cephalexin asd,  to f/u any worsening symptoms or concerns

## 2015-04-22 NOTE — Progress Notes (Signed)
Pre visit review using our clinic review tool, if applicable. No additional management support is needed unless otherwise documented below in the visit note. 

## 2015-04-22 NOTE — Addendum Note (Signed)
Addended by: Biagio Borg on: 04/22/2015 01:02 PM   Modules accepted: Orders, SmartSet

## 2015-04-22 NOTE — Assessment & Plan Note (Signed)
Likely msk post fall persistent, but cant r/o early pyelonephritis, for labs, urine cx, today

## 2015-04-22 NOTE — Progress Notes (Signed)
   Subjective:    Patient ID: [name] [name], female    DOB: Oct 01, 1946, 69 y.o.   MRN: [id number]  HPI    Review of Systems     Objective:   Physical Exam        Assessment & Plan:

## 2015-04-22 NOTE — Progress Notes (Signed)
Subjective:    Patient ID: [name] [name], female    DOB: 1946-12-05, 69 y.o.   MRN: [id number]  HPI  Here with 2 days onset illness, day #1 was working in the yard, felt dizzy, then later fever to 103, nausea and general achiness, later fatigue and weakness. No vomiting, Also had vague mid abd pain , seems less today for some reason.  No flank pain specifically.   Had dysuria x 2 wks ago that seemed better with taking cranberry juice.  Did have and trip and fall with vacuuming in the house, fell on hardwood floor, has some left lower back pain mild intermittent since then.  No ST but has some mild cough nonprod, but does give her a HA to cough. Pt denies chest pain, increased sob or doe, wheezing, orthopnea, PND, increased LE swelling, palpitations, dizziness or syncope.  Denies urinary symptoms such as dysuria, frequency, urgency, flank pain, hematuria or n/v, fever, chills except for "pressure" at end urination and nausea as above Past Medical History  Diagnosis Date  . Psoriasis   . Eczema   . DIABETES MELLITUS 01/30/2008  . GOITER, MULTINODULAR 05/21/2010  . GLUCOSE INTOLERANCE 09/03/2007  . HYPERCHOLESTEROLEMIA 09/03/2007  . HYPERTENSION 09/03/2007  . CORONARY ARTERY DISEASE 09/03/2007  . ASTHMA 09/03/2007  . COPD 09/03/2007  . GERD 09/03/2007  . OBESITY 09/03/2007  . DEGENERATIVE JOINT DISEASE 01/30/2008  . ARTHRITIS 01/30/2008  . COLON CANCER 04/07/2008    pt states she had colon cancer 1987  . LVH (left ventricular hypertrophy)   . Osteopenia   . Impaired glucose tolerance 04/10/2011  . COPD (chronic obstructive pulmonary disease) 04/22/2013  . Diabetic peripheral neuropathy 04/22/2013   Past Surgical History  Procedure Laterality Date  . Abdominal hysterectomy    . Ptca    . Hernia repair    . Tubal ligation    . Hemicolectomy  12/1985    rt    reports that she quit smoking about 36 years ago. Her smoking use included Cigarettes. She has a 15 pack-year smoking history. She has  never used smokeless tobacco. She reports that she does not drink alcohol or use illicit drugs. family history includes Cancer in her other; Colon cancer in her other and paternal aunt; Heart disease in her mother; Liver disease in her father; Thyroid disease in her brother and sister. Allergies  Allergen Reactions  . Simvastatin    Current Outpatient Prescriptions on File Prior to Visit  Medication Sig Dispense Refill  . acyclovir cream (ZOVIRAX) 5 % Apply 1 application topically as needed.    Marland Kitchen ADVAIR DISKUS 250-50 MCG/DOSE AEPB INHALE 1 DOSE BY MOUTH TWICE DAILY. RINSE MOUTH AFTER USE 60 each 5  . albuterol (PROVENTIL HFA;VENTOLIN HFA) 108 (90 BASE) MCG/ACT inhaler Inhale 2 puffs into the lungs every 6 (six) hours as needed. Please put in patient file until she is ready for refill thanks 1 Inhaler 6  . amLODipine (NORVASC) 5 MG tablet TAKE 1 TABLET (5 MG TOTAL) BY MOUTH DAILY. 30 tablet 11  . aspirin 325 MG EC tablet Take 325 mg by mouth daily.      . Blood Glucose Monitoring Suppl (ONE TOUCH ULTRA SYSTEM KIT) W/DEVICE KIT 1 kit by Does not apply route once. 250.02 1 each 0  . clobetasol (TEMOVATE) 0.05 % ointment Use twice once daily to hand and feet as needed 30 g 1  . fluocinolone (SYNALAR) 0.025 % ointment Apply topically 2 (two) times daily as needed. [place]  g 1  . glucose blood (ONE TOUCH ULTRA TEST) test strip Use as directed once daily to check blood sugar. Diagnosis code 250.60 100 each 11  . hydrochlorothiazide (HYDRODIURIL) 25 MG tablet TAKE 1 TABLET EVERY DAY 31 tablet 11  . hydrochlorothiazide (HYDRODIURIL) 25 MG tablet TAKE 1 TABLET EVERY DAY 90 tablet 2  . ketoconazole (NIZORAL) 2 % cream Apply topically 3 (three) times daily. use as directed two times a day as needed under breast 15 g 3  . loratadine-pseudoephedrine (CLARITIN-D 24-HOUR) 10-240 MG per 24 hr tablet Take 1 tablet by mouth daily.    Marland Kitchen lovastatin (MEVACOR) 40 MG tablet TAKE 2 TABLETS EVERY DAY 60 tablet 9  . metFORMIN  (GLUCOPHAGE-XR) 500 MG 24 hr tablet TAKE 2 TABLETS EVERY DAY 180 tablet 3  . metoprolol tartrate (LOPRESSOR) 25 MG tablet TAKE 1 & 1/2 TABLETS TWICE A DAY 90 tablet 11  . naproxen (NAPROSYN) 500 MG tablet Take 1 tablet (500 mg total) by mouth 2 (two) times daily with a meal. 60 tablet 9  . omeprazole (PRILOSEC) 20 MG capsule TAKE ONE CAPSULE EVERY DAY 90 capsule 3  . ONE TOUCH LANCETS MISC Use as directed once daily to check blood sugar.  Diagnosis code 250.60 200 each 11  . tiZANidine (ZANAFLEX) 4 MG tablet Take 1 tablet (4 mg total) by mouth every 6 (six) hours as needed for muscle spasms. 60 tablet 1  . traMADol (ULTRAM) 50 MG tablet TAKE 1 TABLET EVERY 6 HOURS AS NEEDED FOR PAIN 120 tablet 3   No current facility-administered medications on file prior to visit.    Review of Systems  Constitutional: Negative for unusual diaphoresis or night sweats HENT: Negative for ringing in ear or discharge Eyes: Negative for double vision or worsening visual disturbance.  Respiratory: Negative for choking and stridor.   Gastrointestinal: Negative for vomiting or other signifcant bowel change Genitourinary: Negative for hematuria or change in urine volume.  Musculoskeletal: Negative for other MSK pain or swelling Skin: Negative for color change and worsening wound.  Neurological: Negative for tremors and numbness other than noted  Psychiatric/Behavioral: Negative for decreased concentration or agitation other than above        Objective:   Physical Exam BP 110/60 mmHg  Pulse 112  Temp(Src) 99.1 F (37.3 C) (Oral)  Wt 140 lb 1.3 oz (63.54 kg)  SpO2 96% VS noted, mild ill appearing Constitutional: Pt appears in no significant distress HENT: Head: NCAT.  Right Ear: External ear normal.  Left Ear: External ear normal.  Eyes: . Pupils are equal, round, and reactive to light. Conjunctivae and EOM are normal Neck: Normal range of motion. Neck supple.  Cardiovascular: Normal rate and regular  rhythm.   Pulmonary/Chest: Effort normal and breath sounds without rales or wheezing.  Abd:  Soft, ND, + BS with diffuse mid abd tender, no guarding or rebound Spine; nontender Has some tenderness to left flank/leftlower lumbar paravertebral as well Neurological: Pt is alert. Not confused , motor grossly intact Skin: Skin is warm. No rash, no LE edema Psychiatric: Pt behavior is normal. No agitation.     Assessment & Plan:

## 2015-04-24 ENCOUNTER — Encounter: Payer: Self-pay | Admitting: Internal Medicine

## 2015-04-24 LAB — URINE CULTURE: Colony Count: >=100000

## 2015-04-26 ENCOUNTER — Encounter: Payer: Self-pay | Admitting: Internal Medicine

## 2015-04-26 ENCOUNTER — Other Ambulatory Visit: Payer: Self-pay | Admitting: Internal Medicine

## 2015-04-26 DIAGNOSIS — R7989 Other specified abnormal findings of blood chemistry: Secondary | ICD-10-CM

## 2015-04-26 MED ORDER — POTASSIUM CHLORIDE ER 10 MEQ PO TBCR: 10.0000 meq | EXTENDED_RELEASE_TABLET | Freq: Every day | ORAL | Status: DC

## 2015-05-05 ENCOUNTER — Other Ambulatory Visit (INDEPENDENT_AMBULATORY_CARE_PROVIDER_SITE_OTHER): Payer: Medicare Other

## 2015-05-05 ENCOUNTER — Encounter: Payer: Self-pay | Admitting: Internal Medicine

## 2015-05-05 ENCOUNTER — Ambulatory Visit (INDEPENDENT_AMBULATORY_CARE_PROVIDER_SITE_OTHER): Payer: Medicare Other | Admitting: Internal Medicine

## 2015-05-05 VITALS — BP 110/62 | HR 80 | Temp 97.9°F | Wt 141.1 lb

## 2015-05-05 DIAGNOSIS — N390 Urinary tract infection, site not specified: Secondary | ICD-10-CM

## 2015-05-05 DIAGNOSIS — Z0000 Encounter for general adult medical examination without abnormal findings: Secondary | ICD-10-CM

## 2015-05-05 DIAGNOSIS — R630 Anorexia: Secondary | ICD-10-CM

## 2015-05-05 DIAGNOSIS — R531 Weakness: Secondary | ICD-10-CM

## 2015-05-05 DIAGNOSIS — R634 Abnormal weight loss: Secondary | ICD-10-CM

## 2015-05-05 DIAGNOSIS — E876 Hypokalemia: Secondary | ICD-10-CM | POA: Diagnosis not present

## 2015-05-05 DIAGNOSIS — T8351XD Infection and inflammatory reaction due to indwelling urinary catheter, subsequent encounter: Secondary | ICD-10-CM

## 2015-05-05 DIAGNOSIS — T83511D Infection and inflammatory reaction due to indwelling urethral catheter, subsequent encounter: Secondary | ICD-10-CM

## 2015-05-05 DIAGNOSIS — R7989 Other specified abnormal findings of blood chemistry: Secondary | ICD-10-CM | POA: Insufficient documentation

## 2015-05-05 DIAGNOSIS — Z0189 Encounter for other specified special examinations: Secondary | ICD-10-CM

## 2015-05-05 LAB — BASIC METABOLIC PANEL
BUN: 9 mg/dL (ref 6–23)
CO2: 29 mEq/L (ref 19–32)
Calcium: 9.6 mg/dL (ref 8.4–10.5)
Chloride: 102 mEq/L (ref 96–112)
Creatinine, Ser: 0.61 mg/dL (ref 0.40–1.20)
GFR: 124.82 mL/min (ref >60.00)
GLUCOSE: 104 mg/dL — AB (ref 70–99)
POTASSIUM: 3.8 meq/L (ref 3.5–5.1)
Sodium: 138 mEq/L (ref 135–145)

## 2015-05-05 LAB — URINALYSIS, ROUTINE W REFLEX MICROSCOPIC
Bilirubin Urine: NEGATIVE
Hgb urine dipstick: NEGATIVE
Ketones, ur: NEGATIVE
Leukocytes, UA: NEGATIVE
Nitrite: NEGATIVE
PH: 6 (ref 5.0–8.0)
Specific Gravity, Urine: <=1.005 — AB (ref 1.000–1.030)
Total Protein, Urine: NEGATIVE
Urine Glucose: NEGATIVE
Urobilinogen, UA: 0.2 (ref 0.0–1.0)

## 2015-05-05 NOTE — Patient Instructions (Signed)

## 2015-05-05 NOTE — Assessment & Plan Note (Signed)
For f/u bmp,  to f/u any worsening symptoms or concerns 

## 2015-05-05 NOTE — Assessment & Plan Note (Signed)
For endo f/u - ? hyperthyroid

## 2015-05-05 NOTE — Assessment & Plan Note (Addendum)
No specific neuro changes or worsening gait d/o., for recheck K and UA  .Note:  Total time for pt hx, exam, review of record with pt in the room, determination of diagnoses and plan for further eval and tx is > 40 min, with over 50% spent in coordination and counseling of patient

## 2015-05-05 NOTE — Assessment & Plan Note (Signed)
Etiology unclaer, no evidence depression, for eval as above

## 2015-05-05 NOTE — Progress Notes (Signed)
Subjective:    Patient ID: [name] [name], female    DOB: [birth year], 69 y.o.   MRN: [id number]  HPI  Here to f/u, has recent UTI, also mild low K, and persistent low TSH, has seen Dr Loanne Drilling years ago, last 2014, missed 2015 f/u.  C/o weakness, no appetitie, has lost some wt she thinks., even 7-8 lbs in the last few wks, with UTI treatment episode Wt Readings from Last 3 Encounters:  05/05/15 141 lb 1.9 oz (64.012 kg)  04/22/15 140 lb 1.3 oz (63.54 kg)  12/16/14 150 lb 6.4 oz (68.221 kg)  Denies current urinary symptoms such as dysuria, frequency, urgency, flank pain, hematuria or n/v, fever, chills, finished antibx.  Did have some vomiting with first potassium pill, but able to take since then.  Denies worsening reflux, abd pain, dysphagia, n/v, bowel change or blood. ,Pt continues to have recurring LBP without change in severity, bowel or bladder change, fever, wt loss,  worsening LE pain/numbness/weakness, gait change or falls. Past Medical History  Diagnosis Date  . Psoriasis   . Eczema   . DIABETES MELLITUS 01/30/2008  . GOITER, MULTINODULAR 05/21/2010  . GLUCOSE INTOLERANCE 09/03/2007  . HYPERCHOLESTEROLEMIA 09/03/2007  . HYPERTENSION 09/03/2007  . CORONARY ARTERY DISEASE 09/03/2007  . ASTHMA 09/03/2007  . COPD 09/03/2007  . GERD 09/03/2007  . OBESITY 09/03/2007  . DEGENERATIVE JOINT DISEASE 01/30/2008  . ARTHRITIS 01/30/2008  . COLON CANCER 04/07/2008    pt states she had colon cancer 1987  . LVH (left ventricular hypertrophy)   . Osteopenia   . Impaired glucose tolerance 04/10/2011  . COPD (chronic obstructive pulmonary disease) 04/22/2013  . Diabetic peripheral neuropathy 04/22/2013   Past Surgical History  Procedure Laterality Date  . Abdominal hysterectomy    . Ptca    . Hernia repair    . Tubal ligation    . Hemicolectomy  12/1985    rt    reports that she quit smoking about 36 years ago. Her smoking use included Cigarettes. She has a 15 pack-year smoking history. She has  never used smokeless tobacco. She reports that she does not drink alcohol or use illicit drugs. family history includes Cancer in her other; Colon cancer in her other and paternal aunt; Heart disease in her mother; Liver disease in her father; Thyroid disease in her brother and sister. Allergies  Allergen Reactions  . Simvastatin    Current Outpatient Prescriptions on File Prior to Visit  Medication Sig Dispense Refill  . acyclovir cream (ZOVIRAX) 5 % Apply 1 application topically as needed.    Marland Kitchen ADVAIR DISKUS 250-50 MCG/DOSE AEPB INHALE 1 DOSE BY MOUTH TWICE DAILY. RINSE MOUTH AFTER USE 60 each 5  . albuterol (PROVENTIL HFA;VENTOLIN HFA) 108 (90 BASE) MCG/ACT inhaler Inhale 2 puffs into the lungs every 6 (six) hours as needed. Please put in patient file until she is ready for refill thanks 1 Inhaler 6  . amLODipine (NORVASC) 5 MG tablet TAKE 1 TABLET (5 MG TOTAL) BY MOUTH DAILY. 30 tablet 11  . aspirin 325 MG EC tablet Take 325 mg by mouth daily.      . Blood Glucose Monitoring Suppl (ONE TOUCH ULTRA SYSTEM KIT) W/DEVICE KIT 1 kit by Does not apply route once. 250.02 1 each 0  . clobetasol (TEMOVATE) 0.05 % ointment Use twice once daily to hand and feet as needed 30 g 1  . fluocinolone (SYNALAR) 0.025 % ointment Apply topically 2 (two) times daily as needed. 30 g  1  . glucose blood (ONE TOUCH ULTRA TEST) test strip Use as directed once daily to check blood sugar. Diagnosis code 250.60 100 each 11  . hydrochlorothiazide (HYDRODIURIL) 25 MG tablet TAKE 1 TABLET EVERY DAY 31 tablet 11  . ketoconazole (NIZORAL) 2 % cream Apply topically 3 (three) times daily. use as directed two times a day as needed under breast 15 g 3  . loratadine-pseudoephedrine (CLARITIN-D 24-HOUR) 10-240 MG per 24 hr tablet Take 1 tablet by mouth daily.    Marland Kitchen lovastatin (MEVACOR) 40 MG tablet TAKE 2 TABLETS EVERY DAY 60 tablet 9  . metFORMIN (GLUCOPHAGE-XR) 500 MG 24 hr tablet TAKE 2 TABLETS EVERY DAY 180 tablet 3  .  metoprolol tartrate (LOPRESSOR) 25 MG tablet TAKE 1 & 1/2 TABLETS TWICE A DAY 90 tablet 11  . naproxen (NAPROSYN) 500 MG tablet Take 1 tablet (500 mg total) by mouth 2 (two) times daily with a meal. 60 tablet 9  . omeprazole (PRILOSEC) 20 MG capsule TAKE ONE CAPSULE EVERY DAY 90 capsule 3  . ONE TOUCH LANCETS MISC Use as directed once daily to check blood sugar.  Diagnosis code 250.60 200 each 11  . potassium chloride (KLOR-CON 10) 10 MEQ tablet Take 1 tablet (10 mEq total) by mouth daily. 90 tablet 3  . tiZANidine (ZANAFLEX) 4 MG tablet Take 1 tablet (4 mg total) by mouth every 6 (six) hours as needed for muscle spasms. 60 tablet 1  . traMADol (ULTRAM) 50 MG tablet TAKE 1 TABLET EVERY 6 HOURS AS NEEDED FOR PAIN 120 tablet 3  . hydrochlorothiazide (HYDRODIURIL) 25 MG tablet TAKE 1 TABLET EVERY DAY (Patient not taking: Reported on 05/05/2015) 90 tablet 2  . ondansetron (ZOFRAN) 4 MG tablet Take 1 tablet (4 mg total) by mouth every 8 (eight) hours as needed for nausea or vomiting. (Patient not taking: Reported on 05/05/2015) 30 tablet 0   No current facility-administered medications on file prior to visit.   Review of Systems  Constitutional: Negative for unusual diaphoresis or night sweats HENT: Negative for ringing in ear or discharge Eyes: Negative for double vision or worsening visual disturbance.  Respiratory: Negative for choking and stridor.   Gastrointestinal: Negative for vomiting or other signifcant bowel change Genitourinary: Negative for hematuria or change in urine volume.  Musculoskeletal: Negative for other MSK pain or swelling Skin: Negative for color change and worsening wound.  Neurological: Negative for tremors and numbness other than noted  Psychiatric/Behavioral: Negative for decreased concentration or agitation other than above       Objective:   Physical Exam BP 110/62 mmHg  Pulse 80  Temp(Src) 97.9 F (36.6 C) (Oral)  Wt 141 lb 1.9 oz (64.012 kg)  SpO2 97% BP  Readings from Last 3 Encounters:  05/05/15 110/62  04/22/15 110/60  12/16/14 112/64   VS noted,  Constitutional: Pt appears in no significant distress HENT: Head: NCAT.  Right Ear: External ear normal.  Left Ear: External ear normal.  Eyes: . Pupils are equal, round, and reactive to light. Conjunctivae and EOM are normal Neck: Normal range of motion. Neck supple.  Cardiovascular: Normal rate and regular rhythm.   Pulmonary/Chest: Effort normal and breath sounds without rales or wheezing.  Abd:  Soft, NT, ND, + BS Neurological: Pt is alert. Not confused , motor grossly intact Skin: Skin is warm. No rash, no LE edema Psychiatric: Pt behavior is normal. No agitation.      Assessment & Plan:

## 2015-05-05 NOTE — Progress Notes (Signed)
Pre visit review using our clinic review tool, if applicable. No additional management support is needed unless otherwise documented below in the visit note. 

## 2015-05-05 NOTE — Assessment & Plan Note (Signed)
Etiology unclear, for spep, ensure bid prn,  to f/u any worsening symptoms or concerns

## 2015-05-05 NOTE — Assessment & Plan Note (Signed)
Asympt, clinically resolved, for f/u uA

## 2015-05-07 ENCOUNTER — Encounter: Payer: Self-pay | Admitting: Endocrinology

## 2015-05-07 ENCOUNTER — Ambulatory Visit (INDEPENDENT_AMBULATORY_CARE_PROVIDER_SITE_OTHER): Payer: Medicare Other | Admitting: Endocrinology

## 2015-05-07 VITALS — BP 122/74 | HR 85 | Temp 98.0°F | Ht 63.0 in | Wt 142.0 lb

## 2015-05-07 DIAGNOSIS — E0590 Thyrotoxicosis, unspecified without thyrotoxic crisis or storm: Secondary | ICD-10-CM | POA: Diagnosis not present

## 2015-05-07 LAB — PROTEIN ELECTROPHORESIS, SERUM
ABNORMAL PROTEIN BAND1: 0.2 g/dL
ABNORMAL PROTEIN BAND3: NOT DETECTED g/dL
Abnormal Protein Band2: 0.1 g/dL
Albumin ELP: 3.5 g/dL — ABNORMAL LOW (ref 3.8–4.8)
Alpha-1-Globulin: 0.4 g/dL — ABNORMAL HIGH (ref 0.2–0.3)
Alpha-2-Globulin: 1 g/dL — ABNORMAL HIGH (ref 0.5–0.9)
Beta 2: 0.8 g/dL — ABNORMAL HIGH (ref 0.2–0.5)
Beta Globulin: 0.5 g/dL (ref 0.4–0.6)
GAMMA GLOBULIN: 0.7 g/dL — AB (ref 0.8–1.7)
TOTAL PROTEIN, SERUM ELECTROPHOR: 7 g/dL (ref 6.1–8.1)

## 2015-05-07 NOTE — Progress Notes (Signed)
Subjective:    Patient ID: [name] [name], female    DOB: [birth year], 69 y.o.   MRN: [id number]  HPI Pt returns for f/u of a small multinodular goiter (dx'ed 2011; nodules have not met criteria for bx; f/u ultrasound in 2014 was unchanged; she has never been on thyroid medication). She says she does not notice the goiter. pt states she feels well in general, except for fatigue.  Past Medical History  Diagnosis Date  . Psoriasis   . Eczema   . DIABETES MELLITUS 01/30/2008  . GOITER, MULTINODULAR 05/21/2010  . GLUCOSE INTOLERANCE 09/03/2007  . HYPERCHOLESTEROLEMIA 09/03/2007  . HYPERTENSION 09/03/2007  . CORONARY ARTERY DISEASE 09/03/2007  . ASTHMA 09/03/2007  . COPD 09/03/2007  . GERD 09/03/2007  . OBESITY 09/03/2007  . DEGENERATIVE JOINT DISEASE 01/30/2008  . ARTHRITIS 01/30/2008  . COLON CANCER 04/07/2008    pt states she had colon cancer 1987  . LVH (left ventricular hypertrophy)   . Osteopenia   . Impaired glucose tolerance 04/10/2011  . COPD (chronic obstructive pulmonary disease) 04/22/2013  . Diabetic peripheral neuropathy 04/22/2013    Past Surgical History  Procedure Laterality Date  . Abdominal hysterectomy    . Ptca    . Hernia repair    . Tubal ligation    . Hemicolectomy  12/1985    rt    History   Social History  . Marital Status: Married    Spouse Name: N/A  . Number of Children: 4  . Years of Education: N/A   Occupational History  . stay at home    Social History Main Topics  . Smoking status: Former Smoker -- 1.00 packs/day for 15 years    Types: Cigarettes    Quit date: 12/12/1978  . Smokeless tobacco: Never Used  . Alcohol Use: No  . Drug Use: No  . Sexual Activity: Not on file   Other Topics Concern  . Not on file   Social History Narrative   1 child died with meningitis    Current Outpatient Prescriptions on File Prior to Visit  Medication Sig Dispense Refill  . acyclovir cream (ZOVIRAX) 5 % Apply 1 application topically as needed.    Marland Kitchen  ADVAIR DISKUS 250-50 MCG/DOSE AEPB INHALE 1 DOSE BY MOUTH TWICE DAILY. RINSE MOUTH AFTER USE 60 each 5  . albuterol (PROVENTIL HFA;VENTOLIN HFA) 108 (90 BASE) MCG/ACT inhaler Inhale 2 puffs into the lungs every 6 (six) hours as needed. Please put in patient file until she is ready for refill thanks 1 Inhaler 6  . amLODipine (NORVASC) 5 MG tablet TAKE 1 TABLET (5 MG TOTAL) BY MOUTH DAILY. 30 tablet 11  . aspirin 325 MG EC tablet Take 325 mg by mouth daily.      . Blood Glucose Monitoring Suppl (ONE TOUCH ULTRA SYSTEM KIT) W/DEVICE KIT 1 kit by Does not apply route once. 250.02 1 each 0  . clobetasol (TEMOVATE) 0.05 % ointment Use twice once daily to hand and feet as needed 30 g 1  . fluocinolone (SYNALAR) 0.025 % ointment Apply topically 2 (two) times daily as needed. 30 g 1  . glucose blood (ONE TOUCH ULTRA TEST) test strip Use as directed once daily to check blood sugar. Diagnosis code 250.60 100 each 11  . hydrochlorothiazide (HYDRODIURIL) 25 MG tablet TAKE 1 TABLET EVERY DAY 31 tablet 11  . hydrochlorothiazide (HYDRODIURIL) 25 MG tablet TAKE 1 TABLET EVERY DAY 90 tablet 2  . ketoconazole (NIZORAL) 2 % cream Apply topically  3 (three) times daily. use as directed two times a day as needed under breast 15 g 3  . KLOR-CON M10 10 MEQ tablet Take 10 mEq by mouth daily.  3  . loratadine-pseudoephedrine (CLARITIN-D 24-HOUR) 10-240 MG per 24 hr tablet Take 1 tablet by mouth daily.    Marland Kitchen lovastatin (MEVACOR) 40 MG tablet TAKE 2 TABLETS EVERY DAY 60 tablet 9  . metFORMIN (GLUCOPHAGE-XR) 500 MG 24 hr tablet TAKE 2 TABLETS EVERY DAY 180 tablet 3  . metoprolol tartrate (LOPRESSOR) 25 MG tablet TAKE 1 & 1/2 TABLETS TWICE A DAY 90 tablet 11  . naproxen (NAPROSYN) 500 MG tablet Take 1 tablet (500 mg total) by mouth 2 (two) times daily with a meal. 60 tablet 9  . omeprazole (PRILOSEC) 20 MG capsule TAKE ONE CAPSULE EVERY DAY 90 capsule 3  . ONE TOUCH LANCETS MISC Use as directed once daily to check blood sugar.   Diagnosis code 250.60 200 each 11  . potassium chloride (KLOR-CON 10) 10 MEQ tablet Take 1 tablet (10 mEq total) by mouth daily. 90 tablet 3  . tiZANidine (ZANAFLEX) 4 MG tablet Take 1 tablet (4 mg total) by mouth every 6 (six) hours as needed for muscle spasms. 60 tablet 1  . traMADol (ULTRAM) 50 MG tablet TAKE 1 TABLET EVERY 6 HOURS AS NEEDED FOR PAIN 120 tablet 3  . ondansetron (ZOFRAN) 4 MG tablet Take 1 tablet (4 mg total) by mouth every 8 (eight) hours as needed for nausea or vomiting. (Patient not taking: Reported on 05/07/2015) 30 tablet 0   No current facility-administered medications on file prior to visit.    Allergies  Allergen Reactions  . Simvastatin     Family History  Problem Relation Age of Onset  . Liver disease Father   . Thyroid disease Sister     overactive  . Thyroid disease Brother     overactive  . Colon cancer Other     aunt  . Cancer Other     Colon Cancer-Aunt  . Heart disease Mother   . Colon cancer Paternal Aunt     BP 122/74 mmHg  Pulse 85  Temp(Src) 98 F (36.7 C) (Oral)  Ht $R'5\' 3"'Px$  (1.6 m)  Wt 142 lb (64.411 kg)  BMI 25.16 kg/m2  SpO2 96%    Review of Systems She has lost a few lbs.     Objective:   Physical Exam VITAL SIGNS:  See vs page GENERAL: no distress NECK: Thyroid is slightly enlarged, with multinodular surface.     Lab Results  Component Value Date   TSH 0.04* 04/22/2015   Radiol: i reviewed 2014 thyroid US: Stable multinodular goiter. None of the nodules reach the imaging criteria for biopsy.    Assessment & Plan:  Multinodular goiter, clinically slightly worse.   Hyperthyroidism, new, due to the goiter.   Patient is advised the following: Patient Instructions  Your options are a daily medication to slow the thyroid, or: the radioactive iodine pill, which works like this: we would first check a thyroid "scan" (a special, but easy and painless type of thyroid x ray).  It works like this: you go to the x-ray  department of the hospital to swallow a pill, which contains a miniscule amount of radiation.  You will not notice any symptoms from this.  You will go back to the x-ray department the next day, to lie down in front of a camera.  The results of this will be sent to  me.   Based on the results, i hope to order for you a treatment pill of radioactive iodine.  Although it is a larger amount of radiation, you will again notice no symptoms from this.  The pill is gone from your body in a few days (during which you should stay away from other people), but takes several months to work.  Therefore, please return here approximately 6-8 weeks after the treatment.  This treatment has been available for many years, and the only known side-effect is an underactive thyroid.  It is possible that i would eventually prescribe for you a thyroid hormone pill, which is very inexpensive.  You don't have to worry about side-effects of this thyroid hormone pill, because it is the same molecule your thyroid makes.  Please think these options over, and let me know.

## 2015-05-07 NOTE — Patient Instructions (Addendum)
Your options are a daily medication to slow the thyroid, or: the radioactive iodine pill, which works like this: we would first check a thyroid "scan" (a special, but easy and painless type of thyroid x ray).  It works like this: you go to the x-ray department of the hospital to swallow a pill, which contains a miniscule amount of radiation.  You will not notice any symptoms from this.  You will go back to the x-ray department the next day, to lie down in front of a camera.  The results of this will be sent to me.   Based on the results, i hope to order for you a treatment pill of radioactive iodine.  Although it is a larger amount of radiation, you will again notice no symptoms from this.  The pill is gone from your body in a few days (during which you should stay away from other people), but takes several months to work.  Therefore, please return here approximately 6-8 weeks after the treatment.  This treatment has been available for many years, and the only known side-effect is an underactive thyroid.  It is possible that i would eventually prescribe for you a thyroid hormone pill, which is very inexpensive.  You don't have to worry about side-effects of this thyroid hormone pill, because it is the same molecule your thyroid makes.  Please think these options over, and let me know.

## 2015-05-12 ENCOUNTER — Other Ambulatory Visit: Payer: Self-pay | Admitting: Internal Medicine

## 2015-05-12 DIAGNOSIS — R778 Other specified abnormalities of plasma proteins: Secondary | ICD-10-CM

## 2015-05-13 ENCOUNTER — Other Ambulatory Visit: Payer: Self-pay | Admitting: Internal Medicine

## 2015-05-15 ENCOUNTER — Encounter: Payer: Self-pay | Admitting: Internal Medicine

## 2015-05-15 ENCOUNTER — Other Ambulatory Visit (INDEPENDENT_AMBULATORY_CARE_PROVIDER_SITE_OTHER): Payer: Medicare Other

## 2015-05-15 DIAGNOSIS — I10 Essential (primary) hypertension: Secondary | ICD-10-CM | POA: Diagnosis not present

## 2015-05-15 DIAGNOSIS — Z0000 Encounter for general adult medical examination without abnormal findings: Secondary | ICD-10-CM

## 2015-05-15 DIAGNOSIS — Z0189 Encounter for other specified special examinations: Secondary | ICD-10-CM

## 2015-05-15 DIAGNOSIS — R778 Other specified abnormalities of plasma proteins: Secondary | ICD-10-CM

## 2015-05-15 DIAGNOSIS — R769 Abnormal immunological finding in serum, unspecified: Secondary | ICD-10-CM | POA: Diagnosis not present

## 2015-05-15 DIAGNOSIS — E780 Pure hypercholesterolemia: Secondary | ICD-10-CM

## 2015-05-15 LAB — URINALYSIS, ROUTINE W REFLEX MICROSCOPIC
BILIRUBIN URINE: NEGATIVE
HGB URINE DIPSTICK: NEGATIVE
Ketones, ur: NEGATIVE
Nitrite: NEGATIVE
SPECIFIC GRAVITY, URINE: 1.01 (ref 1.000–1.030)
TOTAL PROTEIN, URINE-UPE24: NEGATIVE
URINE GLUCOSE: NEGATIVE
Urobilinogen, UA: 0.2 (ref 0.0–1.0)
pH: 5.5 (ref 5.0–8.0)

## 2015-05-15 LAB — CBC WITH DIFFERENTIAL/PLATELET
Basophils Absolute: 0 K/uL (ref 0.0–0.1)
Basophils Relative: 0.3 % (ref 0.0–3.0)
Eosinophils Absolute: 0.2 K/uL (ref 0.0–0.7)
Eosinophils Relative: 3.6 % (ref 0.0–5.0)
HCT: 36.7 % (ref 36.0–46.0)
Hemoglobin: 12.1 g/dL (ref 12.0–15.0)
Lymphocytes Relative: 31.9 % (ref 12.0–46.0)
Lymphs Abs: 2 K/uL (ref 0.7–4.0)
MCHC: 32.9 g/dL (ref 30.0–36.0)
MCV: 76.1 fl — ABNORMAL LOW (ref 78.0–100.0)
Monocytes Absolute: 0.4 K/uL (ref 0.1–1.0)
Monocytes Relative: 6.8 % (ref 3.0–12.0)
Neutro Abs: 3.6 K/uL (ref 1.4–7.7)
Neutrophils Relative %: 57.4 % (ref 43.0–77.0)
Platelets: 259 K/uL (ref 150.0–400.0)
RBC: 4.82 Mil/uL (ref 3.87–5.11)
RDW: 17.3 % — ABNORMAL HIGH (ref 11.5–15.5)
WBC: 6.2 K/uL (ref 4.0–10.5)

## 2015-05-15 LAB — HEPATIC FUNCTION PANEL
ALK PHOS: 72 U/L (ref 39–117)
ALT: 16 U/L (ref 0–35)
AST: 19 U/L (ref 0–37)
Albumin: 3.9 g/dL (ref 3.5–5.2)
BILIRUBIN DIRECT: 0.2 mg/dL (ref 0.0–0.3)
Total Bilirubin: 0.7 mg/dL (ref 0.2–1.2)
Total Protein: 6.6 g/dL (ref 6.0–8.3)

## 2015-05-15 LAB — TSH: TSH: 0.01 u[IU]/mL — AB (ref 0.35–4.50)

## 2015-05-15 LAB — BASIC METABOLIC PANEL WITH GFR
BUN: 8 mg/dL (ref 6–23)
CO2: 31 meq/L (ref 19–32)
Calcium: 9.5 mg/dL (ref 8.4–10.5)
Chloride: 104 meq/L (ref 96–112)
Creatinine, Ser: 0.65 mg/dL (ref 0.40–1.20)
GFR: 115.99 mL/min
Glucose, Bld: 109 mg/dL — ABNORMAL HIGH (ref 70–99)
Potassium: 4 meq/L (ref 3.5–5.1)
Sodium: 139 meq/L (ref 135–145)

## 2015-05-15 LAB — LIPID PANEL
Cholesterol: 139 mg/dL (ref 0–200)
HDL: 44.6 mg/dL
LDL Cholesterol: 77 mg/dL (ref 0–99)
NonHDL: 94.4
Total CHOL/HDL Ratio: 3
Triglycerides: 89 mg/dL (ref 0.0–149.0)
VLDL: 17.8 mg/dL (ref 0.0–40.0)

## 2015-05-18 ENCOUNTER — Telehealth: Payer: Self-pay | Admitting: Endocrinology

## 2015-05-18 MED ORDER — METHIMAZOLE 10 MG PO TABS: 10.0000 mg | ORAL_TABLET | Freq: Every day | ORAL | Status: DC

## 2015-05-18 NOTE — Telephone Encounter (Signed)
i have sent a prescription to your pharmacy. if ever you have fever while taking methimazole, stop it and call us, because of the risk of a rare side-effect Please come back for a follow-up appointment in 1 month.

## 2015-05-18 NOTE — Telephone Encounter (Signed)
See note below and please advise, Thanks! 

## 2015-05-18 NOTE — Telephone Encounter (Signed)
Pt advised of MD's note below and voiced understanding. 1 month follow up scheduled for July 12 at 10:15 am.

## 2015-05-18 NOTE — Telephone Encounter (Signed)
Requested call back from the patient.

## 2015-05-18 NOTE — Telephone Encounter (Signed)
Patient stated that Dr Loanne Drilling gave her three option, she stated that she will take the pill once a day, she doesn't remember the name of the medication, please advise

## 2015-05-19 LAB — IMMUNOFIXATION ELECTROPHORESIS
IGM, SERUM: 41 mg/dL — AB (ref 52–322)
IgA: 263 mg/dL (ref 69–380)
IgG (Immunoglobin G), Serum: 896 mg/dL (ref 690–1700)
TOTAL PROTEIN, SERUM ELECTROPHOR: 6.4 g/dL (ref 6.0–8.3)

## 2015-06-23 ENCOUNTER — Ambulatory Visit (INDEPENDENT_AMBULATORY_CARE_PROVIDER_SITE_OTHER): Payer: Medicare Other | Admitting: Endocrinology

## 2015-06-23 ENCOUNTER — Encounter: Payer: Self-pay | Admitting: Endocrinology

## 2015-06-23 VITALS — BP 130/76 | HR 67 | Temp 97.9°F | Resp 16 | Ht 63.0 in | Wt 147.6 lb

## 2015-06-23 DIAGNOSIS — E0590 Thyrotoxicosis, unspecified without thyrotoxic crisis or storm: Secondary | ICD-10-CM

## 2015-06-23 LAB — TSH: TSH: 0.2 u[IU]/mL — ABNORMAL LOW (ref 0.35–4.50)

## 2015-06-23 LAB — T4, FREE: Free T4: 0.91 ng/dL (ref 0.60–1.60)

## 2015-06-23 MED ORDER — METHIMAZOLE 5 MG PO TABS: 5.0000 mg | ORAL_TABLET | Freq: Every day | ORAL | Status: DC

## 2015-06-23 NOTE — Progress Notes (Signed)
Subjective:    Patient ID: [name] [name], female    DOB: [birth year], 69 y.o.   MRN: [id number]  HPI Pt returns for f/u of a small multinodular goiter (dx'ed 2011; nodules have not met criteria for bx; f/u ultrasound in 2014 was unchanged; in 2016, TSH was more suppressed; she was offered I-131 rx, but chose tapazole). since on it, pt states she feels no different, and well in general. Past Medical History  Diagnosis Date  . Psoriasis   . Eczema   . DIABETES MELLITUS 01/30/2008  . GOITER, MULTINODULAR 05/21/2010  . GLUCOSE INTOLERANCE 09/03/2007  . HYPERCHOLESTEROLEMIA 09/03/2007  . HYPERTENSION 09/03/2007  . CORONARY ARTERY DISEASE 09/03/2007  . ASTHMA 09/03/2007  . COPD 09/03/2007  . GERD 09/03/2007  . OBESITY 09/03/2007  . DEGENERATIVE JOINT DISEASE 01/30/2008  . ARTHRITIS 01/30/2008  . COLON CANCER 04/07/2008    pt states she had colon cancer 1987  . LVH (left ventricular hypertrophy)   . Osteopenia   . Impaired glucose tolerance 04/10/2011  . COPD (chronic obstructive pulmonary disease) 04/22/2013  . Diabetic peripheral neuropathy 04/22/2013    Past Surgical History  Procedure Laterality Date  . Abdominal hysterectomy    . Ptca    . Hernia repair    . Tubal ligation    . Hemicolectomy  12/1985    rt    History   Social History  . Marital Status: Married    Spouse Name: N/A  . Number of Children: 4  . Years of Education: N/A   Occupational History  . stay at home    Social History Main Topics  . Smoking status: Former Smoker -- 1.00 packs/day for 15 years    Types: Cigarettes    Quit date: 12/12/1978  . Smokeless tobacco: Never Used  . Alcohol Use: No  . Drug Use: No  . Sexual Activity: Not on file   Other Topics Concern  . Not on file   Social History Narrative   1 child died with meningitis    Current Outpatient Prescriptions on File Prior to Visit  Medication Sig Dispense Refill  . acyclovir cream (ZOVIRAX) 5 % Apply 1 application topically as needed.     Marland Kitchen ADVAIR DISKUS 250-50 MCG/DOSE AEPB INHALE 1 DOSE BY MOUTH TWICE DAILY. RINSE MOUTH AFTER USE 60 each 5  . albuterol (PROVENTIL HFA;VENTOLIN HFA) 108 (90 BASE) MCG/ACT inhaler Inhale 2 puffs into the lungs every 6 (six) hours as needed. Please put in patient file until she is ready for refill thanks 1 Inhaler 6  . amLODipine (NORVASC) 5 MG tablet TAKE 1 TABLET (5 MG TOTAL) BY MOUTH DAILY. 30 tablet 11  . aspirin 325 MG EC tablet Take 325 mg by mouth daily.      . Blood Glucose Monitoring Suppl (ONE TOUCH ULTRA SYSTEM KIT) W/DEVICE KIT 1 kit by Does not apply route once. 250.02 1 each 0  . clobetasol (TEMOVATE) 0.05 % ointment Use twice once daily to hand and feet as needed 30 g 1  . fluocinolone (SYNALAR) 0.025 % ointment Apply topically 2 (two) times daily as needed. 30 g 1  . glucose blood (ONE TOUCH ULTRA TEST) test strip Use as directed once daily to check blood sugar. Diagnosis code 250.60 100 each 11  . hydrochlorothiazide (HYDRODIURIL) 25 MG tablet TAKE 1 TABLET EVERY DAY 31 tablet 11  . hydrochlorothiazide (HYDRODIURIL) 25 MG tablet TAKE 1 TABLET EVERY DAY 90 tablet 2  . ketoconazole (NIZORAL) 2 % cream Apply  topically 3 (three) times daily. use as directed two times a day as needed under breast 15 g 3  . KLOR-CON M10 10 MEQ tablet Take 10 mEq by mouth daily.  3  . loratadine-pseudoephedrine (CLARITIN-D 24-HOUR) 10-240 MG per 24 hr tablet Take 1 tablet by mouth daily.    Marland Kitchen lovastatin (MEVACOR) 40 MG tablet TAKE 2 TABLETS EVERY DAY 60 tablet 9  . metFORMIN (GLUCOPHAGE-XR) 500 MG 24 hr tablet TAKE 2 TABLETS EVERY DAY 180 tablet 3  . metoprolol tartrate (LOPRESSOR) 25 MG tablet TAKE 1 & 1/2 TABLETS TWICE A DAY 90 tablet 11  . naproxen (NAPROSYN) 500 MG tablet Take 1 tablet (500 mg total) by mouth 2 (two) times daily with a meal. 60 tablet 9  . omeprazole (PRILOSEC) 20 MG capsule TAKE ONE CAPSULE EVERY DAY 90 capsule 3  . ondansetron (ZOFRAN) 4 MG tablet Take 1 tablet (4 mg total) by mouth  every 8 (eight) hours as needed for nausea or vomiting. 30 tablet 0  . ONE TOUCH LANCETS MISC Use as directed once daily to check blood sugar.  Diagnosis code 250.60 200 each 11  . potassium chloride (KLOR-CON 10) 10 MEQ tablet Take 1 tablet (10 mEq total) by mouth daily. 90 tablet 3  . tiZANidine (ZANAFLEX) 4 MG tablet Take 1 tablet (4 mg total) by mouth every 6 (six) hours as needed for muscle spasms. 60 tablet 1  . traMADol (ULTRAM) 50 MG tablet TAKE 1 TABLET EVERY 6 HOURS AS NEEDED FOR PAIN 120 tablet 3   No current facility-administered medications on file prior to visit.    Allergies  Allergen Reactions  . Simvastatin     Family History  Problem Relation Age of Onset  . Liver disease Father   . Thyroid disease Sister     overactive  . Thyroid disease Brother     overactive  . Colon cancer Other     aunt  . Cancer Other     Colon Cancer-Aunt  . Heart disease Mother   . Colon cancer Paternal Aunt     BP 130/76 mmHg  Pulse 67  Temp(Src) 97.9 F (36.6 C) (Oral)  Resp 16  Ht 5' 3" (1.6 m)  Wt 147 lb 9.6 oz (66.951 kg)  BMI 26.15 kg/m2  SpO2 97%  Review of Systems Denies fever.      Objective:   Physical Exam VITAL SIGNS:  See vs page GENERAL: no distress Skin: not diaphoretic. Neuro: no tremor.     Lab Results  Component Value Date   TSH 0.20* 06/23/2015      Assessment & Plan:  Hyperthyroidism: improved  Patient is advised the following: Patient Instructions  blood tests are requested for you today.  We'll let you know about the results.   Please come back for a follow-up appointment in 2 months.  if ever you have fever while taking methimazole, stop it and call us, because of the risk of a rare side-effect.     addendum: reduce tapazole to 5 mg qd.

## 2015-06-23 NOTE — Patient Instructions (Addendum)
blood tests are requested for you today.  We'll let you know about the results. Please come back for a follow-up appointment in 2 months.  if ever you have fever while taking methimazole, stop it and call us, because of the risk of a rare side-effect.    

## 2015-07-20 ENCOUNTER — Other Ambulatory Visit: Payer: Self-pay | Admitting: Internal Medicine

## 2015-08-03 ENCOUNTER — Other Ambulatory Visit: Payer: Self-pay | Admitting: Internal Medicine

## 2015-08-05 ENCOUNTER — Other Ambulatory Visit: Payer: Self-pay | Admitting: Internal Medicine

## 2015-08-25 ENCOUNTER — Encounter: Payer: Self-pay | Admitting: Endocrinology

## 2015-08-25 ENCOUNTER — Ambulatory Visit (INDEPENDENT_AMBULATORY_CARE_PROVIDER_SITE_OTHER): Payer: Medicare Other | Admitting: Endocrinology

## 2015-08-25 VITALS — BP 118/74 | HR 74 | Temp 98.5°F | Ht 63.0 in | Wt 151.0 lb

## 2015-08-25 DIAGNOSIS — E0590 Thyrotoxicosis, unspecified without thyrotoxic crisis or storm: Secondary | ICD-10-CM | POA: Diagnosis not present

## 2015-08-25 LAB — T4, FREE: Free T4: 0.96 ng/dL (ref 0.60–1.60)

## 2015-08-25 LAB — TSH: TSH: 1.55 u[IU]/mL (ref 0.35–4.50)

## 2015-08-25 NOTE — Patient Instructions (Addendum)
blood tests are requested for you today.  We'll let you know about the results.   Please come back for a follow-up appointment in 3 months.  if ever you have fever while taking methimazole, stop it and call us, because of the risk of a rare side-effect.

## 2015-08-25 NOTE — Progress Notes (Signed)
Subjective:    Patient ID: [name] [name], female    DOB: [birth year], 69 y.o.   MRN: [id number]  HPI Pt returns for f/u of a small multinodular goiter (dx'ed 2011; nodules have not met criteria for bx; f/u ultrasound in 2014 was unchanged; in 2016, TSH was more suppressed; she was offered I-131 rx, but chose tapazole). since on it, pt states she feels no different, and well in general. Past Medical History  Diagnosis Date  . Psoriasis   . Eczema   . DIABETES MELLITUS 01/30/2008  . GOITER, MULTINODULAR 05/21/2010  . GLUCOSE INTOLERANCE 09/03/2007  . HYPERCHOLESTEROLEMIA 09/03/2007  . HYPERTENSION 09/03/2007  . CORONARY ARTERY DISEASE 09/03/2007  . ASTHMA 09/03/2007  . COPD 09/03/2007  . GERD 09/03/2007  . OBESITY 09/03/2007  . DEGENERATIVE JOINT DISEASE 01/30/2008  . ARTHRITIS 01/30/2008  . COLON CANCER 04/07/2008    pt states she had colon cancer 1987  . LVH (left ventricular hypertrophy)   . Osteopenia   . Impaired glucose tolerance 04/10/2011  . COPD (chronic obstructive pulmonary disease) 04/22/2013  . Diabetic peripheral neuropathy 04/22/2013    Past Surgical History  Procedure Laterality Date  . Abdominal hysterectomy    . Ptca    . Hernia repair    . Tubal ligation    . Hemicolectomy  12/1985    rt    Social History   Social History  . Marital Status: Married    Spouse Name: N/A  . Number of Children: 4  . Years of Education: N/A   Occupational History  . stay at home    Social History Main Topics  . Smoking status: Former Smoker -- 1.00 packs/day for 15 years    Types: Cigarettes    Quit date: 12/12/1978  . Smokeless tobacco: Never Used  . Alcohol Use: No  . Drug Use: No  . Sexual Activity: Not on file   Other Topics Concern  . Not on file   Social History Narrative   1 child died with meningitis    Current Outpatient Prescriptions on File Prior to Visit  Medication Sig Dispense Refill  . acyclovir cream (ZOVIRAX) 5 % Apply 1 application topically as  needed.    Marland Kitchen ADVAIR DISKUS 250-50 MCG/DOSE AEPB INHALE 1 DOSE BY MOUTH TWICE DAILY. RINSE MOUTH AFTER USE 60 each 5  . albuterol (PROVENTIL HFA;VENTOLIN HFA) 108 (90 BASE) MCG/ACT inhaler Inhale 2 puffs into the lungs every 6 (six) hours as needed. Please put in patient file until she is ready for refill thanks 1 Inhaler 6  . amLODipine (NORVASC) 5 MG tablet TAKE 1 TABLET (5 MG TOTAL) BY MOUTH DAILY. 30 tablet 11  . aspirin 325 MG EC tablet Take 325 mg by mouth daily.      . Blood Glucose Monitoring Suppl (ONE TOUCH ULTRA SYSTEM KIT) W/DEVICE KIT 1 kit by Does not apply route once. 250.02 1 each 0  . clobetasol (TEMOVATE) 0.05 % ointment Use twice once daily to hand and feet as needed 30 g 1  . fluocinolone (SYNALAR) 0.025 % ointment Apply topically 2 (two) times daily as needed. 30 g 1  . glucose blood (ONE TOUCH ULTRA TEST) test strip 1 each by Other route daily. Use to check blood sugar daily Dx E11.9 100 each 1  . glucose blood (ONE TOUCH ULTRA TEST) test strip 1 each by Other route daily. Dx Code E11.40 100 each 1  . hydrochlorothiazide (HYDRODIURIL) 25 MG tablet TAKE 1 TABLET EVERY DAY 31  tablet 11  . hydrochlorothiazide (HYDRODIURIL) 25 MG tablet TAKE 1 TABLET EVERY DAY 90 tablet 2  . ketoconazole (NIZORAL) 2 % cream Apply topically 3 (three) times daily. use as directed two times a day as needed under breast 15 g 3  . KLOR-CON M10 10 MEQ tablet Take 10 mEq by mouth daily.  3  . loratadine-pseudoephedrine (CLARITIN-D 24-HOUR) 10-240 MG per 24 hr tablet Take 1 tablet by mouth daily.    Marland Kitchen lovastatin (MEVACOR) 40 MG tablet TAKE 2 TABLETS EVERY DAY 60 tablet 9  . metFORMIN (GLUCOPHAGE-XR) 500 MG 24 hr tablet TAKE 2 TABLETS EVERY DAY 180 tablet 1  . methimazole (TAPAZOLE) 5 MG tablet Take 1 tablet (5 mg total) by mouth daily. 30 tablet 3  . metoprolol tartrate (LOPRESSOR) 25 MG tablet TAKE 1 & 1/2 TABLETS TWICE A DAY 90 tablet 11  . naproxen (NAPROSYN) 500 MG tablet Take 1 tablet (500 mg total)  by mouth 2 (two) times daily with a meal. 60 tablet 9  . omeprazole (PRILOSEC) 20 MG capsule TAKE ONE CAPSULE EVERY DAY 90 capsule 1  . ONETOUCH DELICA LANCETS 76E MISC 1 Device by Other route daily. Use to check blood sugars daily E11.9 200 each 0  . potassium chloride (KLOR-CON 10) 10 MEQ tablet Take 1 tablet (10 mEq total) by mouth daily. 90 tablet 3  . tiZANidine (ZANAFLEX) 4 MG tablet Take 1 tablet (4 mg total) by mouth every 6 (six) hours as needed for muscle spasms. 60 tablet 1  . traMADol (ULTRAM) 50 MG tablet TAKE 1 TABLET EVERY 6 HOURS AS NEEDED FOR PAIN 120 tablet 3   No current facility-administered medications on file prior to visit.    Allergies  Allergen Reactions  . Simvastatin     Family History  Problem Relation Age of Onset  . Liver disease Father   . Thyroid disease Sister     overactive  . Thyroid disease Brother     overactive  . Colon cancer Other     aunt  . Cancer Other     Colon Cancer-Aunt  . Heart disease Mother   . Colon cancer Paternal Aunt     BP 118/74 mmHg  Pulse 74  Temp(Src) 98.5 F (36.9 C) (Oral)  Ht 5' 3" (1.6 m)  Wt 151 lb (68.493 kg)  BMI 26.76 kg/m2  SpO2 95%  Review of Systems She has gained a few lbs.      Objective:   Physical Exam VITAL SIGNS:  See vs page GENERAL: no distress NECK: Thyroid is slightly enlarged, with multinodular surface.    Lab Results  Component Value Date   TSH 1.55 08/25/2015      Assessment & Plan:  Hyperthyroidism, well-controlled.    Patient is advised the following: Patient Instructions  blood tests are requested for you today.  We'll let you know about the results.   Please come back for a follow-up appointment in 3 months.  if ever you have fever while taking methimazole, stop it and call us, because of the risk of a rare side-effect.     addendum: Please continue the same medication

## 2015-09-01 ENCOUNTER — Encounter: Payer: Self-pay | Admitting: Internal Medicine

## 2015-09-21 ENCOUNTER — Other Ambulatory Visit: Payer: Self-pay | Admitting: Pulmonary Disease

## 2015-10-02 ENCOUNTER — Ambulatory Visit (INDEPENDENT_AMBULATORY_CARE_PROVIDER_SITE_OTHER): Payer: Medicare Other | Admitting: Internal Medicine

## 2015-10-02 ENCOUNTER — Encounter: Payer: Self-pay | Admitting: Internal Medicine

## 2015-10-02 VITALS — BP 134/82 | HR 70 | Temp 98.6°F | Resp 18 | Ht 62.0 in | Wt 149.0 lb

## 2015-10-02 DIAGNOSIS — J4531 Mild persistent asthma with (acute) exacerbation: Secondary | ICD-10-CM

## 2015-10-02 DIAGNOSIS — J441 Chronic obstructive pulmonary disease with (acute) exacerbation: Secondary | ICD-10-CM | POA: Diagnosis not present

## 2015-10-02 MED ORDER — HYDROCODONE-HOMATROPINE 5-1.5 MG/5ML PO SYRP
5.0000 mL | ORAL_SOLUTION | Freq: Four times a day (QID) | ORAL | Status: DC | PRN
Start: 2015-10-02 — End: 2016-01-15

## 2015-10-02 MED ORDER — PREDNISONE 10 MG PO TABS: ORAL_TABLET | ORAL | Status: DC

## 2015-10-02 MED ORDER — METHYLPREDNISOLONE ACETATE 80 MG/ML IJ SUSP
80.0000 mg | Freq: Once | INTRAMUSCULAR | Status: AC
  Administered 2015-10-02: 80 mg via INTRAMUSCULAR

## 2015-10-02 MED ORDER — LEVOFLOXACIN 250 MG PO TABS: 250.0000 mg | ORAL_TABLET | Freq: Every day | ORAL | Status: DC

## 2015-10-02 NOTE — Progress Notes (Signed)
Subjective:    Patient ID: [name] [name], female    DOB: January 30, 1946, 69 y.o.   MRN: [id number]  HPI She is here for asthmatic bronchitis.  She started expressing cold symptoms last week. Her symptoms have continued to worsen and she has developed asthmatic bronchitis. She was diagnosed with this years ago and typically gets 2-3 episodes a year. Her last episode she believes was in January 2015. She thinks the weather change has also influenced her current symptoms.  She states productive cough, wheeze, shortness of breath, congestion, runny nose, headaches and fatigue. She has been using her inhalers-probably more than she should. They do help, but she is still having significant wheezing and shortness of breath.  She is a diabetic and her sugar this morning was 114. She typically receives steroids with her asthmatic bronchitis flares. She knows this will raise her sugar.   Medications and allergies reviewed with patient and updated if appropriate.  Patient Active Problem List   Diagnosis Date Noted  . Hyperthyroidism 05/07/2015  . Loss of weight 05/05/2015  . Appetite impaired 05/05/2015  . General weakness 05/05/2015  . UTI (urinary tract infection) 05/05/2015  . Hypokalemia 05/05/2015  . Abnormal TSH 05/05/2015  . Fever 04/22/2015  . Lower back pain 10/31/2014  . Dysuria 11/14/2013  . Diabetic peripheral neuropathy ([place]) 04/22/2013  . Hypersomnolence 06/14/2012  . Eczema 10/19/2011  . Preventative health care 04/10/2011  . GOITER, MULTINODULAR 05/21/2010  . OSTEOPENIA 04/07/2008  . COLON CANCER 04/07/2008  . Diabetes mellitus with neuropathy ([place]) 01/30/2008  . DEGENERATIVE JOINT DISEASE 01/30/2008  . ARTHRITIS 01/30/2008  . HYPERCHOLESTEROLEMIA 09/03/2007  . OBESITY 09/03/2007  . HYPERTENSION 09/03/2007  . CORONARY ARTERY DISEASE 09/03/2007  . Allergic rhinitis 09/03/2007  . Asthma 09/03/2007  . GERD 09/03/2007  . Palpitations 09/03/2007    Past Medical History    Diagnosis Date  . Psoriasis   . Eczema   . DIABETES MELLITUS 01/30/2008  . GOITER, MULTINODULAR 05/21/2010  . GLUCOSE INTOLERANCE 09/03/2007  . HYPERCHOLESTEROLEMIA 09/03/2007  . HYPERTENSION 09/03/2007  . CORONARY ARTERY DISEASE 09/03/2007  . ASTHMA 09/03/2007  . COPD 09/03/2007  . GERD 09/03/2007  . OBESITY 09/03/2007  . DEGENERATIVE JOINT DISEASE 01/30/2008  . ARTHRITIS 01/30/2008  . COLON CANCER 04/07/2008    pt states she had colon cancer 1987  . LVH (left ventricular hypertrophy)   . Osteopenia   . Impaired glucose tolerance 04/10/2011  . COPD (chronic obstructive pulmonary disease) ([place]) 04/22/2013  . Diabetic peripheral neuropathy ([place]) 04/22/2013    Past Surgical History  Procedure Laterality Date  . Abdominal hysterectomy    . Ptca    . Hernia repair    . Tubal ligation    . Hemicolectomy  12/1985    rt    Social History   Social History  . Marital Status: Married    Spouse Name: N/A  . Number of Children: 4  . Years of Education: N/A   Occupational History  . stay at home    Social History Main Topics  . Smoking status: Former Smoker -- 1.00 packs/day for 15 years    Types: Cigarettes    Quit date: 12/12/1978  . Smokeless tobacco: Never Used  . Alcohol Use: No  . Drug Use: No  . Sexual Activity: Not Asked   Other Topics Concern  . None   Social History Narrative   1 child died with meningitis    Review of Systems  Constitutional: Positive for fatigue.  Negative for fever, chills and appetite change.  HENT: Positive for congestion and rhinorrhea. Negative for ear pain and sore throat.   Respiratory: Positive for cough (productive, grayish-brown thick phlegm), shortness of breath and wheezing.   Cardiovascular: Negative for chest pain, palpitations and leg swelling.  Gastrointestinal: Negative for nausea, abdominal pain and diarrhea.  Musculoskeletal: Negative for myalgias.  Neurological: Positive for dizziness and headaches. Negative for  light-headedness.       Objective:   Filed Vitals:   10/02/15 1045  BP: 134/82  Pulse: 70  Temp: 98.6 F (37 C)  Resp: 18   Filed Weights   10/02/15 1045  Weight: 149 lb (67.586 kg)   Body mass index is 27.25 kg/(m^2).   Physical Exam  Constitutional: She is oriented to person, place, and time. She appears well-developed and well-nourished. No distress.  HENT:  Head: Normocephalic and atraumatic.  Right Ear: External ear normal.  Left Ear: External ear normal.  Mouth/Throat: Oropharynx is clear and moist. No oropharyngeal exudate.  Eyes: Conjunctivae are normal.  Neck: Neck supple. No tracheal deviation present. No thyromegaly present.  Cardiovascular: Normal rate, regular rhythm and normal heart sounds.   Pulmonary/Chest: Effort normal. No respiratory distress. She has wheezes (Diffuse with inspiration and expiration).  Musculoskeletal: She exhibits no edema.  Lymphadenopathy:    She has no cervical adenopathy.  Neurological: She is alert and oriented to person, place, and time.  Skin: Skin is warm and dry. She is not diaphoretic.          Assessment & Plan:   Asthmatic bronchitis/COPD exacerbation Likely triggered by URI symptoms and change in weather Symptoms are worsening and she has diffuse wheezes on inspiration and expiration Her cough is productive of discolored phlegm Will start antibiotic-Levaquin 250 mg daily for 10 days and she has tolerated this well in the past Prednisone taper Cough syrup with codeine Medrol injection here today  She will call if her symptoms do not improve or worsen in anyway  She has diabetes and her sugars are currently controlled. She notes that her sugars will be elevated with the steroids and will monitor this closely. Advised her to call with any questions or concerns

## 2015-10-02 NOTE — Progress Notes (Signed)
Pre visit review using our clinic review tool, if applicable. No additional management support is needed unless otherwise documented below in the visit note. 

## 2015-10-02 NOTE — Patient Instructions (Signed)
Your prescriptions were sent to your pharmacy for an antibiotic, cough syrup and steroids.  You received a steroid injection here.  If you are not feeling better or your symptoms worse please call us.   As you know your sugars will be higher with the steroids.  If you have any questions or concerns please call us.

## 2015-10-26 ENCOUNTER — Telehealth: Payer: Self-pay | Admitting: Internal Medicine

## 2015-10-26 DIAGNOSIS — E119 Type 2 diabetes mellitus without complications: Secondary | ICD-10-CM

## 2015-10-26 DIAGNOSIS — Z0000 Encounter for general adult medical examination without abnormal findings: Secondary | ICD-10-CM

## 2015-10-26 NOTE — Telephone Encounter (Signed)
Patient states she wants to come tomorrow to get early lab work done before CPE.  She states that she knows her insurance will cover.  She is asking for those labs to be entered.  I did inform patient that Dr. Jenny Reichmann is not in today but will give a call back as soon as possible.

## 2015-10-27 NOTE — Telephone Encounter (Signed)
orders done 

## 2015-10-28 NOTE — Telephone Encounter (Signed)
Informed patient

## 2015-11-02 ENCOUNTER — Other Ambulatory Visit (INDEPENDENT_AMBULATORY_CARE_PROVIDER_SITE_OTHER): Payer: Medicare Other

## 2015-11-02 DIAGNOSIS — E7800 Pure hypercholesterolemia, unspecified: Secondary | ICD-10-CM | POA: Diagnosis not present

## 2015-11-02 DIAGNOSIS — I10 Essential (primary) hypertension: Secondary | ICD-10-CM

## 2015-11-02 DIAGNOSIS — E119 Type 2 diabetes mellitus without complications: Secondary | ICD-10-CM

## 2015-11-02 DIAGNOSIS — Z0189 Encounter for other specified special examinations: Secondary | ICD-10-CM | POA: Diagnosis not present

## 2015-11-02 LAB — URINALYSIS, ROUTINE W REFLEX MICROSCOPIC
Bilirubin Urine: NEGATIVE
HGB URINE DIPSTICK: NEGATIVE
Ketones, ur: NEGATIVE
Leukocytes, UA: NEGATIVE
NITRITE: NEGATIVE
Specific Gravity, Urine: 1.025 (ref 1.000–1.030)
Total Protein, Urine: NEGATIVE
Urine Glucose: NEGATIVE
Urobilinogen, UA: 0.2 (ref 0.0–1.0)
WBC UA: NONE SEEN (ref 0–?)
pH: 6 (ref 5.0–8.0)

## 2015-11-02 LAB — BASIC METABOLIC PANEL
BUN: 11 mg/dL (ref 6–23)
CALCIUM: 9 mg/dL (ref 8.4–10.5)
CHLORIDE: 106 meq/L (ref 96–112)
CO2: 28 mEq/L (ref 19–32)
CREATININE: 0.76 mg/dL (ref 0.40–1.20)
GFR: 96.71 mL/min (ref >60.00)
Glucose, Bld: 90 mg/dL (ref 70–99)
Potassium: 4.1 mEq/L (ref 3.5–5.1)
Sodium: 142 mEq/L (ref 135–145)

## 2015-11-02 LAB — LIPID PANEL
CHOL/HDL RATIO: 2
Cholesterol: 141 mg/dL (ref 0–200)
HDL: 56.5 mg/dL (ref >39.00)
LDL CALC: 73 mg/dL (ref 0–99)
NonHDL: 84.43
TRIGLYCERIDES: 55 mg/dL (ref 0.0–149.0)
VLDL: 11 mg/dL (ref 0.0–40.0)

## 2015-11-02 LAB — HEPATIC FUNCTION PANEL
ALT: 16 U/L (ref 0–35)
AST: 20 U/L (ref 0–37)
Albumin: 3.8 g/dL (ref 3.5–5.2)
Alkaline Phosphatase: 66 U/L (ref 39–117)
BILIRUBIN TOTAL: 0.5 mg/dL (ref 0.2–1.2)
Bilirubin, Direct: 0.1 mg/dL (ref 0.0–0.3)
Total Protein: 6.2 g/dL (ref 6.0–8.3)

## 2015-11-02 LAB — MICROALBUMIN / CREATININE URINE RATIO
CREATININE, U: 126.6 mg/dL
MICROALB/CREAT RATIO: 0.7 mg/g (ref 0.0–30.0)
Microalb, Ur: 0.9 mg/dL (ref 0.0–1.9)

## 2015-11-02 LAB — HEMOGLOBIN A1C: HEMOGLOBIN A1C: 6.2 % (ref 4.6–6.5)

## 2015-11-02 LAB — TSH: TSH: 3.14 u[IU]/mL (ref 0.35–4.50)

## 2015-11-03 ENCOUNTER — Encounter: Payer: Self-pay | Admitting: Internal Medicine

## 2015-11-03 LAB — CBC WITH DIFFERENTIAL/PLATELET
BASOS PCT: 0.3 % (ref 0.0–3.0)
Basophils Absolute: 0 10*3/uL (ref 0.0–0.1)
EOS PCT: 5.7 % — AB (ref 0.0–5.0)
Eosinophils Absolute: 0.3 10*3/uL (ref 0.0–0.7)
HEMATOCRIT: 38 % (ref 36.0–46.0)
HEMOGLOBIN: 12 g/dL (ref 12.0–15.0)
LYMPHS PCT: 38.8 % (ref 12.0–46.0)
Lymphs Abs: 2 10*3/uL (ref 0.7–4.0)
MCHC: 31.6 g/dL (ref 30.0–36.0)
MCV: 81.5 fl (ref 78.0–100.0)
MONO ABS: 0.5 10*3/uL (ref 0.1–1.0)
Monocytes Relative: 9 % (ref 3.0–12.0)
Neutro Abs: 2.3 10*3/uL (ref 1.4–7.7)
Neutrophils Relative %: 46.2 % (ref 43.0–77.0)
Platelets: 228 10*3/uL (ref 150.0–400.0)
RBC: 4.67 Mil/uL (ref 3.87–5.11)
RDW: 17.6 % — AB (ref 11.5–15.5)
WBC: 5.1 10*3/uL (ref 4.0–10.5)

## 2015-11-06 ENCOUNTER — Encounter: Payer: Self-pay | Admitting: Gastroenterology

## 2015-11-10 ENCOUNTER — Encounter: Payer: Self-pay | Admitting: Internal Medicine

## 2015-11-10 ENCOUNTER — Ambulatory Visit (INDEPENDENT_AMBULATORY_CARE_PROVIDER_SITE_OTHER): Payer: Medicare Other | Admitting: Internal Medicine

## 2015-11-10 VITALS — BP 118/72 | HR 60 | Temp 97.8°F | Ht 62.0 in | Wt 150.0 lb

## 2015-11-10 DIAGNOSIS — Z0000 Encounter for general adult medical examination without abnormal findings: Secondary | ICD-10-CM

## 2015-11-10 DIAGNOSIS — E1140 Type 2 diabetes mellitus with diabetic neuropathy, unspecified: Secondary | ICD-10-CM

## 2015-11-10 NOTE — Progress Notes (Signed)
Pre visit review using our clinic review tool, if applicable. No additional management support is needed unless otherwise documented below in the visit note. 

## 2015-11-10 NOTE — Progress Notes (Signed)
Subjective:    Patient ID: [name] [name], female    DOB: [birth year], 69 y.o.   MRN: [id number]  HPI  Here for wellness and f/u;  Overall doing ok;  Pt denies Chest pain, worsening SOB, DOE, wheezing, orthopnea, PND, worsening LE edema, palpitations, dizziness or syncope.  Pt denies neurological change such as new headache, facial or extremity weakness.  Pt denies polydipsia, polyuria, or low sugar symptoms. Pt states overall good compliance with treatment and medications, good tolerability, and has been trying to follow appropriate diet.  Pt denies worsening depressive symptoms, suicidal ideation or panic. No fever, night sweats, wt loss, loss of appetite, or other constitutional symptoms.  Pt states good ability with ADL's, has low fall risk, home safety reviewed and adequate, no other significant changes in hearing or vision, and only occasionally active with exercise. Taking the tramadol for neuropathic pain, and once naproxen per day.  Has f/u with endo dec 2016 for hyperthyroid.  Has been seen 2007 with  Cardiology with Echo and ECG cw LVH, normal EF, no further eval or fu needed. BP Readings from Last 3 Encounters:  11/10/15 118/72  10/02/15 134/82  08/25/15 118/74   Wt Readings from Last 3 Encounters:  11/10/15 150 lb (68.04 kg)  10/02/15 149 lb (67.586 kg)  08/25/15 151 lb (68.493 kg)   Past Medical History  Diagnosis Date  . Psoriasis   . Eczema   . DIABETES MELLITUS 01/30/2008  . GOITER, MULTINODULAR 05/21/2010  . GLUCOSE INTOLERANCE 09/03/2007  . HYPERCHOLESTEROLEMIA 09/03/2007  . HYPERTENSION 09/03/2007  . CORONARY ARTERY DISEASE 09/03/2007  . ASTHMA 09/03/2007  . COPD 09/03/2007  . GERD 09/03/2007  . OBESITY 09/03/2007  . DEGENERATIVE JOINT DISEASE 01/30/2008  . ARTHRITIS 01/30/2008  . COLON CANCER 04/07/2008    pt states she had colon cancer 1987  . LVH (left ventricular hypertrophy)   . Osteopenia   . Impaired glucose tolerance 04/10/2011  . COPD (chronic obstructive pulmonary  disease) ([place]) 04/22/2013  . Diabetic peripheral neuropathy ([place]) 04/22/2013   Past Surgical History  Procedure Laterality Date  . Abdominal hysterectomy    . Ptca    . Hernia repair    . Tubal ligation    . Hemicolectomy  12/1985    rt    reports that she quit smoking about 36 years ago. Her smoking use included Cigarettes. She has a 15 pack-year smoking history. She has never used smokeless tobacco. She reports that she does not drink alcohol or use illicit drugs. family history includes Cancer in her other; Colon cancer in her other and paternal aunt; Heart disease in her mother; Liver disease in her father; Thyroid disease in her brother and sister. Allergies  Allergen Reactions  . Simvastatin    Current Outpatient Prescriptions on File Prior to Visit  Medication Sig Dispense Refill  . acyclovir cream (ZOVIRAX) 5 % Apply 1 application topically as needed.    Marland Kitchen ADVAIR DISKUS 250-50 MCG/DOSE AEPB INHALE 1 DOSE BY MOUTH TWICE DAILY. RINSE MOUTH AFTER USE 60 each 5  . albuterol (PROVENTIL HFA;VENTOLIN HFA) 108 (90 BASE) MCG/ACT inhaler Inhale 2 puffs into the lungs every 6 (six) hours as needed. Please put in patient file until she is ready for refill thanks 1 Inhaler 6  . amLODipine (NORVASC) 5 MG tablet TAKE 1 TABLET (5 MG TOTAL) BY MOUTH DAILY. 30 tablet 11  . aspirin 325 MG EC tablet Take 325 mg by mouth daily.      . Blood  Glucose Monitoring Suppl (ONE TOUCH ULTRA SYSTEM KIT) W/DEVICE KIT 1 kit by Does not apply route once. 250.02 1 each 0  . clobetasol (TEMOVATE) 0.05 % ointment Use twice once daily to hand and feet as needed 30 g 1  . fluocinolone (SYNALAR) 0.025 % ointment Apply topically 2 (two) times daily as needed. 30 g 1  . glucose blood (ONE TOUCH ULTRA TEST) test strip 1 each by Other route daily. Dx Code E11.40 100 each 1  . HYDROcodone-homatropine (HYCODAN) 5-1.5 MG/5ML syrup Take 5 mLs by mouth every 6 (six) hours as needed for cough. 180 mL 0  . ketoconazole (NIZORAL) 2  % cream Apply topically 3 (three) times daily. use as directed two times a day as needed under breast 15 g 3  . KLOR-CON M10 10 MEQ tablet Take 10 mEq by mouth daily.  3  . loratadine-pseudoephedrine (CLARITIN-D 24-HOUR) 10-240 MG per 24 hr tablet Take 1 tablet by mouth daily.    Marland Kitchen lovastatin (MEVACOR) 40 MG tablet TAKE 2 TABLETS EVERY DAY 60 tablet 9  . metFORMIN (GLUCOPHAGE-XR) 500 MG 24 hr tablet TAKE 2 TABLETS EVERY DAY 180 tablet 1  . methimazole (TAPAZOLE) 5 MG tablet Take 1 tablet (5 mg total) by mouth daily. 30 tablet 3  . metoprolol tartrate (LOPRESSOR) 25 MG tablet TAKE 1 & 1/2 TABLETS TWICE A DAY 90 tablet 11  . naproxen (NAPROSYN) 500 MG tablet Take 1 tablet (500 mg total) by mouth 2 (two) times daily with a meal. 60 tablet 9  . omeprazole (PRILOSEC) 20 MG capsule TAKE ONE CAPSULE EVERY DAY 90 capsule 1  . ONETOUCH DELICA LANCETS 42A MISC 1 Device by Other route daily. Use to check blood sugars daily E11.9 200 each 0  . potassium chloride (KLOR-CON 10) 10 MEQ tablet Take 1 tablet (10 mEq total) by mouth daily. 90 tablet 3  . tiZANidine (ZANAFLEX) 4 MG tablet Take 1 tablet (4 mg total) by mouth every 6 (six) hours as needed for muscle spasms. 60 tablet 1  . traMADol (ULTRAM) 50 MG tablet TAKE 1 TABLET EVERY 6 HOURS AS NEEDED FOR PAIN 120 tablet 3  . hydrochlorothiazide (HYDRODIURIL) 25 MG tablet TAKE 1 TABLET EVERY DAY (Patient not taking: Reported on 11/10/2015) 90 tablet 2  . levofloxacin (LEVAQUIN) 250 MG tablet Take 1 tablet (250 mg total) by mouth daily. (Patient not taking: Reported on 11/10/2015) 10 tablet 0  . predniSONE (DELTASONE) 10 MG tablet 3 tabs by mouth per day for 3 days,2tabs per day for 3 days,1tab per day for 3 days (Patient not taking: Reported on 11/10/2015) 18 tablet 0   No current facility-administered medications on file prior to visit.   Review of Systems Constitutional: Negative for increased diaphoresis, other activity, appetite or siginficant weight change  other than noted HENT: Negative for worsening hearing loss, ear pain, facial swelling, mouth sores and neck stiffness.   Eyes: Negative for other worsening pain, redness or visual disturbance.  Respiratory: Negative for shortness of breath and wheezing  Cardiovascular: Negative for chest pain and palpitations.  Gastrointestinal: Negative for diarrhea, blood in stool, abdominal distention or other pain Genitourinary: Negative for hematuria, flank pain or change in urine volume.  Musculoskeletal: Negative for myalgias or other joint complaints.  Skin: Negative for color change and wound or drainage.  Neurological: Negative for syncope and numbness. other than noted Hematological: Negative for adenopathy. or other swelling Psychiatric/Behavioral: Negative for hallucinations, SI, self-injury, decreased concentration or other worsening agitation.  Objective:   Physical Exam BP 118/72 mmHg  Pulse 60  Temp(Src) 97.8 F (36.6 C) (Oral)  Ht _0  (1.575 m)  Wt 150 lb (68.04 kg)  BMI 27.43 kg/m2  SpO2 98% VS noted,  Constitutional: Pt is oriented to person, place, and time. Appears well-developed and well-nourished, in no significant distress Head: Normocephalic and atraumatic.  Right Ear: External ear normal.  Left Ear: External ear normal.  Nose: Nose normal.  Mouth/Throat: Oropharynx is clear and moist.  Eyes: Conjunctivae and EOM are normal. Pupils are equal, round, and reactive to light.  Neck: Normal range of motion. Neck supple. No JVD present. No tracheal deviation present or significant neck LA or mass Cardiovascular: Normal rate, regular rhythm, normal heart sounds and intact distal pulses.   Pulmonary/Chest: Effort normal and breath sounds without rales or wheezing  Abdominal: Soft. Bowel sounds are normal. NT. No HSM  Musculoskeletal: Normal range of motion. Exhibits no edema.  Lymphadenopathy:  Has no cervical adenopathy.  Neurological: Pt is alert and oriented to person,  place, and time. Pt has normal reflexes. No cranial nerve deficit. Motor grossly intact Skin: Skin is warm and dry. No rash noted.  Psychiatric:  Has normal mood and affect. Behavior is normal.      Assessment & Plan:

## 2015-11-10 NOTE — Assessment & Plan Note (Signed)

## 2015-11-10 NOTE — Assessment & Plan Note (Signed)
stable overall by history and exam, recent data reviewed with pt, and pt to continue medical treatment as before,  to f/u any worsening symptoms or concerns Lab Results  Component Value Date   HGBA1C 6.2 11/02/2015

## 2015-11-10 NOTE — Patient Instructions (Addendum)
Please continue all other medications as before, and refills have been done if requested.  Please have the pharmacy call with any other refills you may need.  Please continue your efforts at being more active, low cholesterol diet, and weight control.  You are otherwise up to date with prevention measures today.  Please keep your appointments with your specialists as you may have planned  Please return in 6 months, or sooner if needed, with Lab testing done 3-5 days before  

## 2015-11-16 ENCOUNTER — Other Ambulatory Visit: Payer: Self-pay | Admitting: Internal Medicine

## 2015-11-17 MED ORDER — TRAMADOL HCL 50 MG PO TABS: 50.0000 mg | ORAL_TABLET | Freq: Four times a day (QID) | ORAL | Status: DC | PRN

## 2015-11-17 NOTE — Telephone Encounter (Signed)
Done hardcopy to steph 

## 2015-11-17 NOTE — Telephone Encounter (Signed)
Faxed script back to [facility].../lmb 

## 2015-11-24 ENCOUNTER — Encounter: Payer: Self-pay | Admitting: Endocrinology

## 2015-11-24 ENCOUNTER — Ambulatory Visit (INDEPENDENT_AMBULATORY_CARE_PROVIDER_SITE_OTHER): Payer: Medicare Other | Admitting: Endocrinology

## 2015-11-24 VITALS — BP 118/82 | HR 79 | Temp 97.9°F | Ht 67.0 in | Wt 149.0 lb

## 2015-11-24 DIAGNOSIS — K121 Other forms of stomatitis: Secondary | ICD-10-CM | POA: Diagnosis not present

## 2015-11-24 DIAGNOSIS — E042 Nontoxic multinodular goiter: Secondary | ICD-10-CM | POA: Diagnosis not present

## 2015-11-24 LAB — TSH: TSH: 3.69 u[IU]/mL (ref 0.35–4.50)

## 2015-11-24 LAB — CBC WITH DIFFERENTIAL/PLATELET
BASOS PCT: 0.3 % (ref 0.0–3.0)
Basophils Absolute: 0 10*3/uL (ref 0.0–0.1)
EOS ABS: 0.3 10*3/uL (ref 0.0–0.7)
Eosinophils Relative: 3.2 % (ref 0.0–5.0)
HCT: 39 % (ref 36.0–46.0)
HEMOGLOBIN: 12.5 g/dL (ref 12.0–15.0)
LYMPHS ABS: 2.2 10*3/uL (ref 0.7–4.0)
Lymphocytes Relative: 26 % (ref 12.0–46.0)
MCHC: 32.1 g/dL (ref 30.0–36.0)
MCV: 80.6 fl (ref 78.0–100.0)
MONO ABS: 0.6 10*3/uL (ref 0.1–1.0)
Monocytes Relative: 7.2 % (ref 3.0–12.0)
NEUTROS PCT: 63.3 % (ref 43.0–77.0)
Neutro Abs: 5.5 10*3/uL (ref 1.4–7.7)
Platelets: 209 10*3/uL (ref 150.0–400.0)
RBC: 4.84 Mil/uL (ref 3.87–5.11)
RDW: 17.5 % — AB (ref 11.5–15.5)
WBC: 8.6 10*3/uL (ref 4.0–10.5)

## 2015-11-24 LAB — T4, FREE: Free T4: 0.87 ng/dL (ref 0.60–1.60)

## 2015-11-24 MED ORDER — METHIMAZOLE 5 MG PO TABS: 5.0000 mg | ORAL_TABLET | ORAL | Status: DC

## 2015-11-24 MED ORDER — AMOXICILLIN-POT CLAVULANATE 875-125 MG PO TABS: 1.0000 | ORAL_TABLET | Freq: Two times a day (BID) | ORAL | Status: AC

## 2015-11-24 NOTE — Progress Notes (Signed)
Subjective:    Patient ID: [name] [name], female    DOB: 1946-03-08, 69 y.o.   MRN: [id number]  HPI Pt returns for f/u of a small multinodular goiter (dx'ed 2011; nodules have not met criteria for bx; f/u ultrasound in 2014 was unchanged; in 2016, TSH was more suppressed; she was offered I-131 rx, but chose tapazole). since on it, pt states she feels no different, and well in general.   Pt states few days of slight right upper periodontal pain, and assoc sore throat. Past Medical History  Diagnosis Date  . Psoriasis   . Eczema   . DIABETES MELLITUS 01/30/2008  . GOITER, MULTINODULAR 05/21/2010  . GLUCOSE INTOLERANCE 09/03/2007  . HYPERCHOLESTEROLEMIA 09/03/2007  . HYPERTENSION 09/03/2007  . CORONARY ARTERY DISEASE 09/03/2007  . ASTHMA 09/03/2007  . COPD 09/03/2007  . GERD 09/03/2007  . OBESITY 09/03/2007  . DEGENERATIVE JOINT DISEASE 01/30/2008  . ARTHRITIS 01/30/2008  . COLON CANCER 04/07/2008    pt states she had colon cancer 1987  . LVH (left ventricular hypertrophy)   . Osteopenia   . Impaired glucose tolerance 04/10/2011  . COPD (chronic obstructive pulmonary disease) ([place]) 04/22/2013  . Diabetic peripheral neuropathy ([place]) 04/22/2013    Past Surgical History  Procedure Laterality Date  . Abdominal hysterectomy    . Ptca    . Hernia repair    . Tubal ligation    . Hemicolectomy  12/1985    rt    Social History   Social History  . Marital Status: Married    Spouse Name: N/A  . Number of Children: 4  . Years of Education: N/A   Occupational History  . stay at home    Social History Main Topics  . Smoking status: Former Smoker -- 1.00 packs/day for 15 years    Types: Cigarettes    Quit date: 12/12/1978  . Smokeless tobacco: Never Used  . Alcohol Use: No  . Drug Use: No  . Sexual Activity: Not on file   Other Topics Concern  . Not on file   Social History Narrative   1 child died with meningitis    Current Outpatient Prescriptions on File Prior to Visit    Medication Sig Dispense Refill  . acyclovir cream (ZOVIRAX) 5 % Apply 1 application topically as needed.    Marland Kitchen ADVAIR DISKUS 250-50 MCG/DOSE AEPB INHALE 1 DOSE BY MOUTH TWICE DAILY. RINSE MOUTH AFTER USE 60 each 5  . albuterol (PROVENTIL HFA;VENTOLIN HFA) 108 (90 BASE) MCG/ACT inhaler Inhale 2 puffs into the lungs every 6 (six) hours as needed. Please put in patient file until she is ready for refill thanks 1 Inhaler 6  . amLODipine (NORVASC) 5 MG tablet TAKE 1 TABLET (5 MG TOTAL) BY MOUTH DAILY. 30 tablet 11  . aspirin 325 MG EC tablet Take 325 mg by mouth daily.      . Blood Glucose Monitoring Suppl (ONE TOUCH ULTRA SYSTEM KIT) W/DEVICE KIT 1 kit by Does not apply route once. 250.02 1 each 0  . clobetasol (TEMOVATE) 0.05 % ointment Use twice once daily to hand and feet as needed 30 g 1  . fluocinolone (SYNALAR) 0.025 % ointment Apply topically 2 (two) times daily as needed. 30 g 1  . glucose blood (ONE TOUCH ULTRA TEST) test strip 1 each by Other route daily. Dx Code E11.40 100 each 1  . hydrochlorothiazide (HYDRODIURIL) 25 MG tablet TAKE 1 TABLET EVERY DAY 90 tablet 2  . HYDROcodone-homatropine (HYCODAN) 5-1.5 MG/5ML  syrup Take 5 mLs by mouth every 6 (six) hours as needed for cough. 180 mL 0  . ketoconazole (NIZORAL) 2 % cream Apply topically 3 (three) times daily. use as directed two times a day as needed under breast 15 g 3  . KLOR-CON M10 10 MEQ tablet Take 10 mEq by mouth daily.  3  . loratadine-pseudoephedrine (CLARITIN-D 24-HOUR) 10-240 MG per 24 hr tablet Take 1 tablet by mouth daily.    . lovastatin (MEVACOR) 40 MG tablet TAKE 2 TABLETS EVERY DAY 60 tablet 9  . metFORMIN (GLUCOPHAGE-XR) 500 MG 24 hr tablet TAKE 2 TABLETS EVERY DAY 180 tablet 1  . metoprolol tartrate (LOPRESSOR) 25 MG tablet TAKE 1 & 1/2 TABLETS TWICE A DAY 90 tablet 11  . naproxen (NAPROSYN) 500 MG tablet TAKE 1 TABLET (500 MG TOTAL) BY MOUTH 2 (TWO) TIMES DAILY WITH A MEAL. 60 tablet 3  . omeprazole (PRILOSEC) 20 MG  capsule TAKE ONE CAPSULE EVERY DAY 90 capsule 1  . ONETOUCH DELICA LANCETS 33G MISC 1 Device by Other route daily. Use to check blood sugars daily E11.9 200 each 0  . potassium chloride (KLOR-CON 10) 10 MEQ tablet Take 1 tablet (10 mEq total) by mouth daily. 90 tablet 3  . predniSONE (DELTASONE) 10 MG tablet 3 tabs by mouth per day for 3 days,2tabs per day for 3 days,1tab per day for 3 days 18 tablet 0  . tiZANidine (ZANAFLEX) 4 MG tablet Take 1 tablet (4 mg total) by mouth every 6 (six) hours as needed for muscle spasms. 60 tablet 1  . traMADol (ULTRAM) 50 MG tablet Take 1 tablet (50 mg total) by mouth every 6 (six) hours as needed. for pain 120 tablet 3   No current facility-administered medications on file prior to visit.    Allergies  Allergen Reactions  . Simvastatin     Family History  Problem Relation Age of Onset  . Liver disease Father   . Thyroid disease Sister     overactive  . Thyroid disease Brother     overactive  . Colon cancer Other     aunt  . Cancer Other     Colon Cancer-Aunt  . Heart disease Mother   . Colon cancer Paternal Aunt     BP 118/82 mmHg  Pulse 79  Temp(Src) 97.9 F (36.6 C) (Oral)  Ht 5' 7" (1.702 m)  Wt 149 lb (67.586 kg)  BMI 23.33 kg/m2  SpO2 97%  Review of Systems Denies fever and weight change.      Objective:   Physical Exam VITAL SIGNS:  See vs page GENERAL: no distress mouth: 2 cm ulcer posterior to the right upper teeth.  Lab Results  Component Value Date   TSH 3.69 11/24/2015      Assessment & Plan:  Hyperthyroidism: well-controlled Oral ulcer: new  Patient is advised the following: Patient Instructions  blood tests are requested for you today.  We'll let you know about the results.   Please come back for a follow-up appointment in 4 months.   if ever you have fever while taking methimazole, stop it and call us, because of the risk of a rare side-effect.   i have sent a prescription to your pharmacy, for and  antibiotic pill.   Please see an ear-nose-throat specialist.    addendum: reduce the methimazole to 1 pill, 3 times a week. 

## 2015-11-24 NOTE — Patient Instructions (Addendum)
blood tests are requested for you today.  We'll let you know about the results.   Please come back for a follow-up appointment in 4 months.   if ever you have fever while taking methimazole, stop it and call us, because of the risk of a rare side-effect.   i have sent a prescription to your pharmacy, for and antibiotic pill.   Please see an ear-nose-throat specialist.

## 2015-11-25 DIAGNOSIS — R220 Localized swelling, mass and lump, head: Secondary | ICD-10-CM | POA: Diagnosis not present

## 2015-11-27 ENCOUNTER — Other Ambulatory Visit: Payer: Self-pay | Admitting: Otolaryngology

## 2015-11-27 DIAGNOSIS — R220 Localized swelling, mass and lump, head: Secondary | ICD-10-CM

## 2015-12-09 ENCOUNTER — Ambulatory Visit
Admission: RE | Admit: 2015-12-09 | Discharge: 2015-12-09 | Disposition: A | Discharge status: Home / Self Care | Payer: Medicare Other | Source: Ambulatory Visit | Attending: Otolaryngology | Admitting: Otolaryngology

## 2015-12-09 DIAGNOSIS — R221 Localized swelling, mass and lump, neck: Secondary | ICD-10-CM | POA: Diagnosis not present

## 2015-12-09 DIAGNOSIS — R220 Localized swelling, mass and lump, head: Secondary | ICD-10-CM

## 2015-12-09 MED ORDER — IOPAMIDOL (ISOVUE-300) INJECTION 61%
75.0000 mL | Freq: Once | INTRAVENOUS | Status: AC | PRN
Start: 2015-12-09 — End: 2015-12-09
  Administered 2015-12-09: 75 mL via INTRAVENOUS

## 2015-12-11 DIAGNOSIS — R220 Localized swelling, mass and lump, head: Secondary | ICD-10-CM | POA: Diagnosis not present

## 2015-12-11 DIAGNOSIS — K1379 Other lesions of oral mucosa: Secondary | ICD-10-CM | POA: Diagnosis not present

## 2015-12-16 ENCOUNTER — Other Ambulatory Visit: Payer: Self-pay | Admitting: Internal Medicine

## 2015-12-18 ENCOUNTER — Ambulatory Visit: Payer: Self-pay | Admitting: Otolaryngology

## 2015-12-18 NOTE — H&P (Signed)
Assessment  Palate mass (784.2) (R22.0). Orders  CT Neck with contrast; Requested for: 25 Nov 2015. Discussed  Soft palate mass with extension into the maxillary tubercle area and the hard palate. This is concerning for a malignancy. Recommend CT evaluation followed by a biopsy in the office. Reason For Visit  Pt here for evaluation of upper oral ulcer in mouth. HPI  3 week history of a sore inside her mouth on the right side. She has had some ear pain associated with it. She quit smoking 30 years ago. Allergies  No Known Drug Allergies. Current Meds  Hydrocodone-Homatropine 5-1.5 MG/5ML Oral Syrup;TAKE 5 MLS BY MOUTH EVERY 6 HOURS AS NEEDED FOR COUGH; Qty180; R0; RPT. Advair Diskus 250-50 MCG/DOSE Inhalation Aerosol Powder Breath Activated;; Qty60; R0; RPT. AmLODIPine Besylate 5 MG Oral Tablet;; Qty30; R0; RPT. Klor-Con M10 10 MEQ Oral Tablet Extended Release;; Qty90; R0; RPT. HydroCHLOROthiazide 25 MG Oral Tablet;; Qty90; R0; RPT. Lovastatin 40 MG Oral Tablet;; Qty60; R0; RPT. MethIMAzole 5 MG Oral Tablet;; Qty30; R0; RPT. Naproxen 500 MG Oral Tablet;; Qty60; R0; RPT. MetFORMIN HCl ER 500 MG Oral Tablet Extended Release 24 Hour;; Qty180; R0; RPT. Metoprolol Tartrate 25 MG Oral Tablet;; Qty90; R0; RPT. Omeprazole 20 MG Oral Capsule Delayed Release;; Qty90; R0; RPT. OneTouch Delica Lancets [id number] Miscellaneous;; Qty100; R0; RPT. PredniSONE 10 MG Oral Tablet;; [id number]; R0; RPT. OneTouch Ultra Blue In [employer];; Qty100; R0; RPT. TraMADol HCl - 50 MG Oral Tablet;; Qty120; R0; RPT. Zovirax CREA;; Qty0; R0; RPT. Albuterol AERS;; Qty0; R0; RPT. Aspir-81 TBEC;; Qty0; R0; RPT. Augmentin 875-125 MG Oral Tablet(Amoxicillin-Pot Clavulanate);; Qty0; R0; RPT. Clobetasol Propionate 0.05 % External Ointment;; Qty0; R0; RPT. Fluocinonide OINT;; Qty0; R0; RPT. Claritin TABS;; Qty0; R0; RPT. Past Meds  Amoxicillin-Pot Clavulanate 875-125 MG Oral Tablet;; Qty14; R0; RPT. LevoFLOXacin 250 MG Oral  Tablet;; Qty10; R0; RPT. MethIMAzole 10 MG Oral Tablet;; Qty30; R0; RPT. Active Problems  Asthma   (493.90) (J45.909) High cholesterol   (272.0) (E78.00). PMH  History of malignant neoplasm of colon (V10.05) (Z85.038). [place]  Gastric Surgery Hysterectomy Oral Surgery. Family Hx  Family history of diabetes mellitus (V18.0) (Z83.3) Family history of malignant neoplasm (V16.9) (Z80.9) Seasonal allergies (J30.2) No pertinent family history: Mother. Personal Hx  Caffeine use (V49.89) (F15.90) Former smoker [phone]) [phone]). ROS  12 system ROS was obtained and reviewed on the Health Maintenance form dated today.  Positive responses are shown above.  If the symptom is not checked, the patient has denied it. Vital Signs   Recorded by Sallyanne Kuster on 25 Nov 2015 09:57 AM BP:124/73,  BSA Calculated: 1.69 ,  BMI Calculated: 27.25 ,  Weight: 149 lb , BMI: 27.3 kg/m2,  Height: 5 ft 2 in. Physical Exam  APPEARANCE: Well developed, well nourished, in no acute distress.  Normal affect, in a pleasant mood.  Oriented to time, place and person. COMMUNICATION: Normal voice   HEAD & FACE:  No scars, lesions or masses of head and face.  Sinuses nontender to palpation.  Salivary glands without mass or tenderness.  Facial strength symmetric.  No facial lesion, scars, or mass. EYES: EOMI with normal primary gaze alignment. Visual acuity grossly intact.  PERRLA EXTERNAL EAR & NOSE: No scars, lesions or masses  EAC & TYMPANIC MEMBRANE:  EAC shows no obstructing lesions or debris and tympanic membranes are normal bilaterally with good movement to insufflation. GROSS HEARING: Normal   TMJ:  Nontender  INTRANASAL EXAM: No polyps or purulence.  NASOPHARYNX: Normal, without lesions. LIPS, TEETH &  GUMS: No lip lesions, normal dentition and normal gums. ORAL CAVITY/OROPHARYNX:  Oral mucosa moist, healthy tongue, floor of mouth, oropharynx on the left. There is a visible raised ulcerative mass involving the  right soft palate, hard palate, maxillary tubercle. The ulceration is about 1-1/2 cm but the palpable mass is about 3 cm. NECK:  Supple without adenopathy or mass. THYROID:  Normal with no masses palpable.  NEUROLOGIC:  No gross CN deficits. No nystagmus noted.   LYMPHATIC:  No enlarged nodes palpable. Signature  Electronically signed by : Izora Gala  M.D.;.

## 2015-12-21 NOTE — Pre-Procedure Instructions (Addendum)
Lilium Wheller Hufstetler  12/21/2015    Your procedure is scheduled on Thursday, January 12.  Report to [facility] Admitting at  5:30A.M.                 Your surgery or procedure is scheduled for 7:30 A.M.   Call this number if you have problems the morning of [phone]                 For any other questions, please call [phone], Monday - Friday 8 AM - 4 PM.   Remember:  Do not eat food or drink liquids after midnight Wednesday, January 11.  Take these medicines the morning of surgery with A SIP OF WATER :)metoprolol tartrate (LOPRESSOR),  lovastatin (MEVACOR),                   May use inhalers. Please bring Albuterol inhaler to the hospital with you.                 May take traMADol Veatrice Bourbon) if needed.                     So not take any Aspirin, Aspirin Products, Naproxen, Advil (Ibuprofen), Vitamins or herbal medications.                                   How to Manage your Diabetes Before Surgery  What do I do about my diabetes medications?                          Do not take oral diabetes medicines (pills) the morning of surgery.                                                   Why is it important to control my blood sugar before and after surgery?   Improving blood sugar levels before and after surgery helps healing and can limit problems.  A way of improving blood sugar control is eating a healthy diet by:  - Eating less sugar and carbohydrates  - Increasing activity/exercise  - Talk with your doctor about reaching your blood sugar goals  High blood sugars (greater than 180 mg/dL) can raise your risk of infections and slow down your recovery so you will need to focus on controlling your diabetes during the weeks before surgery.  Make sure that the doctor who takes care of your diabetes knows about your planned surgery including the date and location.  How do I manage my blood sugars before surgery?   Check your blood sugar at least 4 times  a day, 2 days before surgery to make sure that they are not too high or low.   Check your blood sugar the morning of your surgery when you wake up and every 2 hours until you get to the Short-Stay unit.  If your blood sugar is less than 70 mg/dL, you will need to treat for low blood sugar by:  Treat a low blood sugar (less than 70 mg/dL) with 1/2 cup of clear juice (cranberry or apple), 4 glucose tablets, OR glucose gel.  Recheck blood sugar in 15 minutes after treatment (to make sure it is  greater than 70 mg/dL).  If blood sugar is not greater than 70 mg/dL on re-check, call [phone] for further instructions.   Report your blood sugar to the Short-Stay nurse when you get to Short-Stay.     Do not wear jewelry, make-up or nail polish.  Do not wear lotions, powders, or perfumes.    Do not shave 48 hours prior to surgery.    Do not bring valuables to the hospital.  [facility] is not responsible for any belongings or valuables.  Contacts, dentures or bridgework may not be worn into surgery.  Leave your suitcase in the car.  After surgery it may be brought to your room.  For patients admitted to the hospital, discharge time will be determined by your treatment team.  Patients discharged the day of surgery will not be allowed to drive home.   Name and phone number of your driver:   -  Special instructions:  Review  [place] - Preparing For Surgery.  Please read over the following fact sheets that you were given. Pain Booklet, Coughing and Deep Breathing and Surgical Site Infection Prevention

## 2015-12-22 ENCOUNTER — Encounter (HOSPITAL_COMMUNITY): Payer: Self-pay

## 2015-12-22 ENCOUNTER — Encounter (HOSPITAL_COMMUNITY)
Admission: RE | Admit: 2015-12-22 | Discharge: 2015-12-22 | Disposition: A | Discharge status: Home / Self Care | Payer: Medicare Other | Source: Ambulatory Visit | Attending: Otolaryngology | Admitting: Otolaryngology

## 2015-12-22 DIAGNOSIS — J45909 Unspecified asthma, uncomplicated: Secondary | ICD-10-CM | POA: Diagnosis not present

## 2015-12-22 DIAGNOSIS — C113 Malignant neoplasm of anterior wall of nasopharynx: Secondary | ICD-10-CM | POA: Diagnosis not present

## 2015-12-22 DIAGNOSIS — Z7984 Long term (current) use of oral hypoglycemic drugs: Secondary | ICD-10-CM | POA: Diagnosis not present

## 2015-12-22 DIAGNOSIS — Z87891 Personal history of nicotine dependence: Secondary | ICD-10-CM | POA: Diagnosis not present

## 2015-12-22 DIAGNOSIS — I2510 Atherosclerotic heart disease of native coronary artery without angina pectoris: Secondary | ICD-10-CM | POA: Diagnosis not present

## 2015-12-22 DIAGNOSIS — Z7982 Long term (current) use of aspirin: Secondary | ICD-10-CM | POA: Diagnosis not present

## 2015-12-22 DIAGNOSIS — E1142 Type 2 diabetes mellitus with diabetic polyneuropathy: Secondary | ICD-10-CM | POA: Diagnosis not present

## 2015-12-22 DIAGNOSIS — M1990 Unspecified osteoarthritis, unspecified site: Secondary | ICD-10-CM | POA: Diagnosis not present

## 2015-12-22 DIAGNOSIS — K219 Gastro-esophageal reflux disease without esophagitis: Secondary | ICD-10-CM | POA: Diagnosis not present

## 2015-12-22 DIAGNOSIS — Z79899 Other long term (current) drug therapy: Secondary | ICD-10-CM | POA: Diagnosis not present

## 2015-12-22 DIAGNOSIS — Z7952 Long term (current) use of systemic steroids: Secondary | ICD-10-CM | POA: Diagnosis not present

## 2015-12-22 DIAGNOSIS — R220 Localized swelling, mass and lump, head: Secondary | ICD-10-CM | POA: Diagnosis present

## 2015-12-22 DIAGNOSIS — I10 Essential (primary) hypertension: Secondary | ICD-10-CM | POA: Diagnosis not present

## 2015-12-22 DIAGNOSIS — J449 Chronic obstructive pulmonary disease, unspecified: Secondary | ICD-10-CM | POA: Diagnosis not present

## 2015-12-22 DIAGNOSIS — Z85038 Personal history of other malignant neoplasm of large intestine: Secondary | ICD-10-CM | POA: Diagnosis not present

## 2015-12-22 DIAGNOSIS — E7800 Pure hypercholesterolemia, unspecified: Secondary | ICD-10-CM | POA: Diagnosis not present

## 2015-12-22 HISTORY — DX: Reserved for inherently not codable concepts without codable children: IMO0001

## 2015-12-22 HISTORY — DX: Malignant (primary) neoplasm, unspecified: C80.1

## 2015-12-22 HISTORY — DX: Personal history of other medical treatment: Z92.89

## 2015-12-22 LAB — BASIC METABOLIC PANEL
Anion gap: 9 (ref 5–15)
BUN: 14 mg/dL (ref 6–20)
CALCIUM: 9.5 mg/dL (ref 8.9–10.3)
CO2: 30 mmol/L (ref 22–32)
CREATININE: 0.74 mg/dL (ref 0.44–1.00)
Chloride: 102 mmol/L (ref 101–111)
GFR calc Af Amer: >60 mL/min (ref >60)
GFR calc non Af Amer: >60 mL/min (ref >60)
GLUCOSE: 114 mg/dL — AB (ref 65–99)
Potassium: 4.3 mmol/L (ref 3.5–5.1)
Sodium: 141 mmol/L (ref 135–145)

## 2015-12-22 LAB — CBC
HCT: 38.1 % (ref 36.0–46.0)
Hemoglobin: 12.2 g/dL (ref 12.0–15.0)
MCH: 25.8 pg — AB (ref 26.0–34.0)
MCHC: 32 g/dL (ref 30.0–36.0)
MCV: 80.5 fL (ref 78.0–100.0)
PLATELETS: 236 10*3/uL (ref 150–400)
RBC: 4.73 MIL/uL (ref 3.87–5.11)
RDW: 16.7 % — ABNORMAL HIGH (ref 11.5–15.5)
WBC: 8.6 10*3/uL (ref 4.0–10.5)

## 2015-12-22 LAB — GLUCOSE, CAPILLARY: Glucose-Capillary: 97 mg/dL (ref 65–99)

## 2015-12-24 ENCOUNTER — Ambulatory Visit (HOSPITAL_COMMUNITY)
Admission: RE | Admit: 2015-12-24 | Discharge: 2015-12-24 | Disposition: A | Discharge status: Home / Self Care | Payer: Medicare Other | Source: Ambulatory Visit | Attending: Otolaryngology | Admitting: Otolaryngology

## 2015-12-24 ENCOUNTER — Encounter (HOSPITAL_COMMUNITY): Payer: Self-pay | Admitting: Anesthesiology

## 2015-12-24 ENCOUNTER — Ambulatory Visit (HOSPITAL_COMMUNITY): Payer: Medicare Other | Admitting: Anesthesiology

## 2015-12-24 ENCOUNTER — Encounter (HOSPITAL_COMMUNITY)
Admission: RE | Disposition: A | Discharge status: Home / Self Care | Payer: Self-pay | Source: Ambulatory Visit | Attending: Otolaryngology

## 2015-12-24 DIAGNOSIS — K219 Gastro-esophageal reflux disease without esophagitis: Secondary | ICD-10-CM | POA: Insufficient documentation

## 2015-12-24 DIAGNOSIS — M1990 Unspecified osteoarthritis, unspecified site: Secondary | ICD-10-CM | POA: Insufficient documentation

## 2015-12-24 DIAGNOSIS — J45909 Unspecified asthma, uncomplicated: Secondary | ICD-10-CM | POA: Diagnosis not present

## 2015-12-24 DIAGNOSIS — C113 Malignant neoplasm of anterior wall of nasopharynx: Secondary | ICD-10-CM | POA: Insufficient documentation

## 2015-12-24 DIAGNOSIS — Z7982 Long term (current) use of aspirin: Secondary | ICD-10-CM | POA: Insufficient documentation

## 2015-12-24 DIAGNOSIS — Z7952 Long term (current) use of systemic steroids: Secondary | ICD-10-CM | POA: Diagnosis not present

## 2015-12-24 DIAGNOSIS — Z7984 Long term (current) use of oral hypoglycemic drugs: Secondary | ICD-10-CM | POA: Insufficient documentation

## 2015-12-24 DIAGNOSIS — R220 Localized swelling, mass and lump, head: Secondary | ICD-10-CM | POA: Diagnosis not present

## 2015-12-24 DIAGNOSIS — Z79899 Other long term (current) drug therapy: Secondary | ICD-10-CM | POA: Diagnosis not present

## 2015-12-24 DIAGNOSIS — I2510 Atherosclerotic heart disease of native coronary artery without angina pectoris: Secondary | ICD-10-CM | POA: Insufficient documentation

## 2015-12-24 DIAGNOSIS — J449 Chronic obstructive pulmonary disease, unspecified: Secondary | ICD-10-CM | POA: Insufficient documentation

## 2015-12-24 DIAGNOSIS — Z85038 Personal history of other malignant neoplasm of large intestine: Secondary | ICD-10-CM | POA: Insufficient documentation

## 2015-12-24 DIAGNOSIS — Z87891 Personal history of nicotine dependence: Secondary | ICD-10-CM | POA: Diagnosis not present

## 2015-12-24 DIAGNOSIS — E7800 Pure hypercholesterolemia, unspecified: Secondary | ICD-10-CM | POA: Diagnosis not present

## 2015-12-24 DIAGNOSIS — E1142 Type 2 diabetes mellitus with diabetic polyneuropathy: Secondary | ICD-10-CM | POA: Insufficient documentation

## 2015-12-24 DIAGNOSIS — I10 Essential (primary) hypertension: Secondary | ICD-10-CM | POA: Diagnosis not present

## 2015-12-24 DIAGNOSIS — D490 Neoplasm of unspecified behavior of digestive system: Secondary | ICD-10-CM | POA: Diagnosis not present

## 2015-12-24 HISTORY — PX: FLOOR OF MOUTH BIOPSY: SHX5834

## 2015-12-24 LAB — GLUCOSE, CAPILLARY
GLUCOSE-CAPILLARY: 144 mg/dL — AB (ref 65–99)
Glucose-Capillary: 117 mg/dL — ABNORMAL HIGH (ref 65–99)

## 2015-12-24 SURGERY — BIOPSY, MOUTH, FLOOR
Anesthesia: General

## 2015-12-24 MED ORDER — LIDOCAINE-EPINEPHRINE 1 %-1:100000 IJ SOLN
INTRAMUSCULAR | Status: AC
  Filled 2015-12-24: qty 1

## 2015-12-24 MED ORDER — FENTANYL CITRATE (PF) 100 MCG/2ML IJ SOLN: 25.0000 ug | INTRAMUSCULAR | Status: DC | PRN

## 2015-12-24 MED ORDER — PROPOFOL 10 MG/ML IV BOLUS
INTRAVENOUS | Status: DC | PRN
  Administered 2015-12-24: 100 mg via INTRAVENOUS

## 2015-12-24 MED ORDER — ROCURONIUM BROMIDE 100 MG/10ML IV SOLN
INTRAVENOUS | Status: DC | PRN
  Administered 2015-12-24: 10 mg via INTRAVENOUS

## 2015-12-24 MED ORDER — LIDOCAINE-EPINEPHRINE 1 %-1:100000 IJ SOLN
INTRAMUSCULAR | Status: DC | PRN
  Administered 2015-12-24: 3 mL

## 2015-12-24 MED ORDER — SUCCINYLCHOLINE CHLORIDE 20 MG/ML IJ SOLN
INTRAMUSCULAR | Status: DC | PRN
  Administered 2015-12-24: 100 mg via INTRAVENOUS

## 2015-12-24 MED ORDER — PROPOFOL 10 MG/ML IV BOLUS
INTRAVENOUS | Status: AC
  Filled 2015-12-24: qty 20

## 2015-12-24 MED ORDER — HYDROCODONE-ACETAMINOPHEN 7.5-325 MG PO TABS: 1.0000 | ORAL_TABLET | Freq: Four times a day (QID) | ORAL | Status: DC | PRN

## 2015-12-24 MED ORDER — LACTATED RINGERS IV SOLN
INTRAVENOUS | Status: DC | PRN
  Administered 2015-12-24: 07:00:00 via INTRAVENOUS

## 2015-12-24 MED ORDER — 0.9 % SODIUM CHLORIDE (POUR BTL) OPTIME
TOPICAL | Status: DC | PRN
  Administered 2015-12-24: 1000 mL

## 2015-12-24 MED ORDER — ONDANSETRON HCL 4 MG/2ML IJ SOLN
INTRAMUSCULAR | Status: DC | PRN
  Administered 2015-12-24: 4 mg via INTRAVENOUS

## 2015-12-24 MED ORDER — FENTANYL CITRATE (PF) 100 MCG/2ML IJ SOLN
INTRAMUSCULAR | Status: DC | PRN
  Administered 2015-12-24: 50 ug via INTRAVENOUS
  Administered 2015-12-24: 100 ug via INTRAVENOUS

## 2015-12-24 MED ORDER — LIDOCAINE HCL (CARDIAC) 20 MG/ML IV SOLN
INTRAVENOUS | Status: DC | PRN
  Administered 2015-12-24: 80 mg via INTRAVENOUS

## 2015-12-24 MED ORDER — MEPERIDINE HCL 25 MG/ML IJ SOLN: 6.2500 mg | INTRAMUSCULAR | Status: DC | PRN

## 2015-12-24 MED ORDER — FENTANYL CITRATE (PF) 250 MCG/5ML IJ SOLN
INTRAMUSCULAR | Status: AC
  Filled 2015-12-24: qty 5

## 2015-12-24 MED ORDER — PROMETHAZINE HCL 25 MG/ML IJ SOLN: 6.2500 mg | INTRAMUSCULAR | Status: DC | PRN

## 2015-12-24 SURGICAL SUPPLY — 30 items
BLADE SURG 15 STRL LF DISP TIS (BLADE) IMPLANT
BLADE SURG 15 STRL SS (BLADE)
CANISTER SUCTION 2500CC (MISCELLANEOUS) ×2 IMPLANT
CORDS BIPOLAR (ELECTRODE) IMPLANT
CRADLE DONUT ADULT HEAD (MISCELLANEOUS) IMPLANT
DERMABOND ADVANCED (GAUZE/BANDAGES/DRESSINGS) ×1
DERMABOND ADVANCED .7 DNX12 (GAUZE/BANDAGES/DRESSINGS) ×1 IMPLANT
DRAPE PROXIMA HALF (DRAPES) IMPLANT
ELECT COATED BLADE 2.86 ST (ELECTRODE) IMPLANT
ELECT REM PT RETURN 9FT ADLT (ELECTROSURGICAL)
ELECTRODE REM PT RTRN 9FT ADLT (ELECTROSURGICAL) IMPLANT
FORCEPS BIPOLAR SPETZLER 8 1.0 (NEUROSURGERY SUPPLIES) IMPLANT
GAUZE SPONGE 4X4 12PLY STRL (GAUZE/BANDAGES/DRESSINGS) ×2 IMPLANT
GLOVE ECLIPSE 7.0 STRL STRAW (GLOVE) ×2 IMPLANT
GLOVE SS BIOGEL STRL SZ 7.5 (GLOVE) ×1 IMPLANT
GLOVE SUPERSENSE BIOGEL SZ 7.5 (GLOVE) ×1
KIT BASIN OR (CUSTOM PROCEDURE TRAY) IMPLANT
KIT ROOM TURNOVER OR (KITS) ×2 IMPLANT
NEEDLE 27GAX1X1/2 (NEEDLE) IMPLANT
NS IRRIG 1000ML POUR BTL (IV SOLUTION) ×2 IMPLANT
PAD ARMBOARD 7.5X6 YLW CONV (MISCELLANEOUS) ×4 IMPLANT
PENCIL FOOT CONTROL (ELECTRODE) ×2 IMPLANT
SPONGE GAUZE 4X4 12PLY STER LF (GAUZE/BANDAGES/DRESSINGS) ×2 IMPLANT
SPONGE TONSIL 1 RF SGL (DISPOSABLE) ×2 IMPLANT
SURGILUBE 2OZ TUBE FLIPTOP (MISCELLANEOUS) IMPLANT
SUT CHROMIC 3 0 PS 2 (SUTURE) IMPLANT
SYRINGE CONTROL L 12CC (SYRINGE) IMPLANT
TOWEL OR 17X24 6PK STRL BLUE (TOWEL DISPOSABLE) ×4 IMPLANT
TRAY ENT MC OR (CUSTOM PROCEDURE TRAY) ×2 IMPLANT
TUBE CONNECTING 12X1/4 (SUCTIONS) ×2 IMPLANT

## 2015-12-24 NOTE — Anesthesia Procedure Notes (Signed)
Procedure Name: Intubation Date/Time: 12/24/2015 7:42 AM Performed by: Jacquiline Doe A Pre-anesthesia Checklist: Patient identified, Timeout performed, Emergency Drugs available, Suction available and Patient being monitored Patient Re-evaluated:Patient Re-evaluated prior to inductionOxygen Delivery Method: Circle system utilized Preoxygenation: Pre-oxygenation with 100% oxygen Intubation Type: IV induction and Cricoid Pressure applied Ventilation: Mask ventilation without difficulty Laryngoscope Size: Mac and 4 Grade View: Grade I Tube type: Oral Tube size: 7.5 mm Number of attempts: 1 Airway Equipment and Method: Stylet Placement Confirmation: ETT inserted through vocal cords under direct vision,  breath sounds checked- equal and bilateral and positive ETCO2 Secured at: 21 cm Tube secured with: Tape Dental Injury: Teeth and Oropharynx as per pre-operative assessment

## 2015-12-24 NOTE — Discharge Instructions (Signed)
Rinse mouth with salt water (half teaspoon in a glass of water) 3 times daily.

## 2015-12-24 NOTE — Interval H&P Note (Signed)
History and Physical Interval Note:  12/24/2015 7:23 AM  [name] [name]  has presented today for surgery, with the diagnosis of PALET MASS  The various methods of treatment have been discussed with the patient and family. After consideration of risks, benefits and other options for treatment, the patient has consented to  Procedure(s): ORAL BIOPSY (N/A) as a surgical intervention .  The patient's history has been reviewed, patient examined, no change in status, stable for surgery.  I have reviewed the patient's chart and labs.  Questions were answered to the patient's satisfaction.     Imanie Darrow

## 2015-12-24 NOTE — Anesthesia Preprocedure Evaluation (Addendum)
Anesthesia Evaluation  Patient identified by MRN, date of birth, ID band Patient awake    Reviewed: Allergy & Precautions, NPO status , Patient's Chart, lab work & pertinent test results, reviewed documented beta blocker date and time   Airway Mallampati: II  TM Distance: >3 FB Neck ROM: Full    Dental  (+) Poor Dentition, Caps, Chipped,    Pulmonary shortness of breath and with exertion, asthma , COPD,  COPD inhaler, former smoker,    Pulmonary exam normal breath sounds clear to auscultation       Cardiovascular hypertension, Pt. on medications and Pt. on home beta blockers + CAD  Normal cardiovascular exam Rhythm:Regular Rate:Normal     Neuro/Psych Diabetic peripheral neuropathy  Neuromuscular disease negative psych ROS   GI/Hepatic Neg liver ROS, GERD  Medicated and Controlled,  Endo/Other  diabetes, Well Controlled, Type 2, Oral Hypoglycemic AgentsHyperthyroidism Hx/o multinodular goiter Hyperlipidemia  Renal/GU negative Renal ROS  negative genitourinary   Musculoskeletal  (+) Arthritis , Osteoarthritis,    Abdominal   Peds  Hematology negative hematology ROS (+)   Anesthesia Other Findings   Reproductive/Obstetrics                            Anesthesia Physical Anesthesia Plan  ASA: III  Anesthesia Plan: General   Post-op Pain Management:    Induction: Intravenous  Airway Management Planned: Oral ETT  Additional Equipment:   Intra-op Plan:   Post-operative Plan: Extubation in OR  Informed Consent: I have reviewed the patients History and Physical, chart, labs and discussed the procedure including the risks, benefits and alternatives for the proposed anesthesia with the patient or authorized representative who has indicated his/her understanding and acceptance.   Dental advisory given  Plan Discussed with: Anesthesiologist, CRNA and Surgeon  Anesthesia Plan Comments:          Anesthesia Quick Evaluation

## 2015-12-24 NOTE — H&P (View-Only) (Signed)
Assessment  Palate mass (784.2) (R22.0). Orders  CT Neck with contrast; Requested for: 25 Nov 2015. Discussed  Soft palate mass with extension into the maxillary tubercle area and the hard palate. This is concerning for a malignancy. Recommend CT evaluation followed by a biopsy in the office. Reason For Visit  Pt here for evaluation of upper oral ulcer in mouth. HPI  3 week history of a sore inside her mouth on the right side. She has had some ear pain associated with it. She quit smoking 30 years ago. Allergies  No Known Drug Allergies. Current Meds  Hydrocodone-Homatropine 5-1.5 MG/5ML Oral Syrup;TAKE 5 MLS BY MOUTH EVERY 6 HOURS AS NEEDED FOR COUGH; Qty180; R0; RPT. Advair Diskus 250-50 MCG/DOSE Inhalation Aerosol Powder Breath Activated;; Qty60; R0; RPT. AmLODIPine Besylate 5 MG Oral Tablet;; Qty30; R0; RPT. Klor-Con M10 10 MEQ Oral Tablet Extended Release;; Qty90; R0; RPT. HydroCHLOROthiazide 25 MG Oral Tablet;; Qty90; R0; RPT. Lovastatin 40 MG Oral Tablet;; Qty60; R0; RPT. MethIMAzole 5 MG Oral Tablet;; Qty30; R0; RPT. Naproxen 500 MG Oral Tablet;; Qty60; R0; RPT. MetFORMIN HCl ER 500 MG Oral Tablet Extended Release 24 Hour;; Qty180; R0; RPT. Metoprolol Tartrate 25 MG Oral Tablet;; Qty90; R0; RPT. Omeprazole 20 MG Oral Capsule Delayed Release;; Qty90; R0; RPT. OneTouch Delica Lancets [id number] Miscellaneous;; Qty100; R0; RPT. PredniSONE 10 MG Oral Tablet;; [id number]; R0; RPT. OneTouch Ultra Blue In [employer];; Qty100; R0; RPT. TraMADol HCl - 50 MG Oral Tablet;; Qty120; R0; RPT. Zovirax CREA;; Qty0; R0; RPT. Albuterol AERS;; Qty0; R0; RPT. Aspir-81 TBEC;; Qty0; R0; RPT. Augmentin 875-125 MG Oral Tablet(Amoxicillin-Pot Clavulanate);; Qty0; R0; RPT. Clobetasol Propionate 0.05 % External Ointment;; Qty0; R0; RPT. Fluocinonide OINT;; Qty0; R0; RPT. Claritin TABS;; Qty0; R0; RPT. Past Meds  Amoxicillin-Pot Clavulanate 875-125 MG Oral Tablet;; Qty14; R0; RPT. LevoFLOXacin 250 MG Oral  Tablet;; Qty10; R0; RPT. MethIMAzole 10 MG Oral Tablet;; Qty30; R0; RPT. Active Problems  Asthma   (493.90) (J45.909) High cholesterol   (272.0) (E78.00). PMH  History of malignant neoplasm of colon (V10.05) (Z85.038). [place]  Gastric Surgery Hysterectomy Oral Surgery. Family Hx  Family history of diabetes mellitus (V18.0) (Z83.3) Family history of malignant neoplasm (V16.9) (Z80.9) Seasonal allergies (J30.2) No pertinent family history: Mother. Personal Hx  Caffeine use (V49.89) (F15.90) Former smoker [phone]) [phone]). ROS  12 system ROS was obtained and reviewed on the Health Maintenance form dated today.  Positive responses are shown above.  If the symptom is not checked, the patient has denied it. Vital Signs   Recorded by Sallyanne Kuster on 25 Nov 2015 09:57 AM BP:124/73,  BSA Calculated: 1.69 ,  BMI Calculated: 27.25 ,  Weight: 149 lb , BMI: 27.3 kg/m2,  Height: 5 ft 2 in. Physical Exam  APPEARANCE: Well developed, well nourished, in no acute distress.  Normal affect, in a pleasant mood.  Oriented to time, place and person. COMMUNICATION: Normal voice   HEAD & FACE:  No scars, lesions or masses of head and face.  Sinuses nontender to palpation.  Salivary glands without mass or tenderness.  Facial strength symmetric.  No facial lesion, scars, or mass. EYES: EOMI with normal primary gaze alignment. Visual acuity grossly intact.  PERRLA EXTERNAL EAR & NOSE: No scars, lesions or masses  EAC & TYMPANIC MEMBRANE:  EAC shows no obstructing lesions or debris and tympanic membranes are normal bilaterally with good movement to insufflation. GROSS HEARING: Normal   TMJ:  Nontender  INTRANASAL EXAM: No polyps or purulence.  NASOPHARYNX: Normal, without lesions. LIPS, TEETH &  GUMS: No lip lesions, normal dentition and normal gums. ORAL CAVITY/OROPHARYNX:  Oral mucosa moist, healthy tongue, floor of mouth, oropharynx on the left. There is a visible raised ulcerative mass involving the  right soft palate, hard palate, maxillary tubercle. The ulceration is about 1-1/2 cm but the palpable mass is about 3 cm. NECK:  Supple without adenopathy or mass. THYROID:  Normal with no masses palpable.  NEUROLOGIC:  No gross CN deficits. No nystagmus noted.   LYMPHATIC:  No enlarged nodes palpable. Signature  Electronically signed by : Izora Gala  M.D.;.

## 2015-12-24 NOTE — Op Note (Signed)
OPERATIVE REPORT  DATE OF SURGERY: 12/24/2015  PATIENT:  Deatra Robinson,  70 y.o. female  PRE-OPERATIVE DIAGNOSIS:  PALATE MASS  POST-OPERATIVE DIAGNOSIS:   SAME  PROCEDURE:  Procedure(s): ORAL BIOPSY  SURGEON:  Beckie Salts, MD  ASSISTANTS: none  ANESTHESIA:   General   EBL:  5 ml  DRAINS: none  LOCAL MEDICATIONS USED:  1% xylocaine with epinephrine  SPECIMEN:  Right palate mass  COUNTS:  Correct  PROCEDURE DETAILS: The patient was taken to the operating room and placed on the operating table in the supine position.  Following induction of general endotracheal anesthesia, the mouth was draped in a standard fashion. A bite guard was used throughout the case. The palate was inspected and the mass was noted. There is about a 2 cm ulceration with a 4-5 cm surrounding submucosal fullness.  Local anesthetic was infiltrated around the lesion.A #15 scalpel was used to incise a 1 cm length by 5 mm width sample of the lesion including the central ulceration and the healthy appearing mucosa. This was removed and sent for pathologic evaluation. A Takahashi forcep was used to sample some of the deeper tissue that had a cartilaginous appearance. There  Was minimal bleeding. A 4 x 4 was used for packing for several minutes until extubation. She was extubated, and awakened without difficulty.    PATIENT DISPOSITION:  To PACU, stable

## 2015-12-24 NOTE — Anesthesia Postprocedure Evaluation (Signed)
Anesthesia Post Note  Patient: [name] [name]  Procedure(s) Performed: Procedure(s) (LRB): ORAL BIOPSY (N/A)  Patient location during evaluation: PACU Anesthesia Type: General Level of consciousness: awake and alert and oriented Pain management: pain level controlled Vital Signs Assessment: post-procedure vital signs reviewed and stable Respiratory status: spontaneous breathing, nonlabored ventilation and respiratory function stable Cardiovascular status: blood pressure returned to baseline and stable Postop Assessment: no signs of nausea or vomiting Anesthetic complications: no    Last Vitals:  Filed Vitals:   12/24/15 0830 12/24/15 0845  BP: 105/70 109/69  Pulse: 73 64  Temp: 36.4 C   Resp: 15     Last Pain:  Filed Vitals:   12/24/15 0858  PainSc: 0-No pain                 Jervis Trapani A.

## 2015-12-24 NOTE — Transfer of Care (Signed)
Immediate Anesthesia Transfer of Care Note  Patient: [name] [name]  Procedure(s) Performed: Procedure(s): ORAL BIOPSY (N/A)  Patient Location: PACU  Anesthesia Type:General  Level of Consciousness: awake, oriented, sedated, patient cooperative and responds to stimulation  Airway & Oxygen Therapy: Patient Spontanous Breathing and Patient connected to nasal cannula oxygen  Post-op Assessment: Report given to RN, Post -op Vital signs reviewed and stable, Patient moving all extremities and Patient moving all extremities X 4  Post vital signs: Reviewed and stable  Last Vitals:  Filed Vitals:   12/24/15 0659  BP: 118/62  Pulse: 64  Temp: 36.4 C  Resp: 20    Complications: No apparent anesthesia complications

## 2015-12-25 ENCOUNTER — Encounter (HOSPITAL_COMMUNITY): Payer: Self-pay | Admitting: Otolaryngology

## 2016-01-13 ENCOUNTER — Other Ambulatory Visit: Payer: Self-pay | Admitting: Internal Medicine

## 2016-01-13 DIAGNOSIS — C069 Malignant neoplasm of mouth, unspecified: Secondary | ICD-10-CM | POA: Diagnosis not present

## 2016-01-14 ENCOUNTER — Ambulatory Visit: Payer: Self-pay | Admitting: Otolaryngology

## 2016-01-14 ENCOUNTER — Other Ambulatory Visit: Payer: Self-pay | Admitting: Otolaryngology

## 2016-01-14 DIAGNOSIS — C069 Malignant neoplasm of mouth, unspecified: Secondary | ICD-10-CM

## 2016-01-14 NOTE — H&P (Signed)
  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 Orders  Dental Consult; Requested for: 13 Jan 2016. CT Neck with contrast; Requested for: 13 Jan 2016. CT Chest with contrast; Requested for: 13 Jan 2016. CT Abdomen with contrast; Requested for: 13 Jan 2016. Discussed  Here today with her family to discuss the results of the biopsy. Her husband, daughter and daughter-in-law are all in attendance. The biopsy revealed a low-grade adenocarcinoma. The optimal treatment for this is complete surgical excision with adequate margins. This will involve a partial maxillectomy. This will be performed with a combined oral and sublabial approach. Upper lip incision may be necessary. She will need an obturator and a NG feeding tube post op.    The case was discussed at tumor board this morning. Recommendation was to check chest and abdomen for any evidence of meastatic disease with CT. Post op radiation may or may not be necessary.   She will also need dental eval for obturator.   All questions were answered. Reason For Visit  Discuss biopsy result. Allergies  No Known Drug Allergies. Current Meds  Naproxen 500 MG Oral Tablet;; RPT PredniSONE 10 MG Oral Tablet;; RPT HydroCHLOROthiazide 25 MG Oral Tablet;; RPT Hydrocodone-Homatropine 5-1.5 MG/5ML Oral Syrup;TAKE 5 MLS BY MOUTH EVERY 6 HOURS AS NEEDED FOR COUGH; RPT Augmentin 875-125 MG Oral Tablet (Amoxicillin-Pot Clavulanate);; RPT Clobetasol Propionate 0.05 % External Ointment;; RPT MethIMAzole 5 MG Oral Tablet;; RPT Claritin TABS;; RPT Fluocinonide OINT;; RPT Albuterol AERS;; RPT Aspir-81 TBEC;; RPT Lovastatin 40 MG Oral Tablet;; RPT OneTouch Ultra Blue In Vitro Strip;; RPT Klor-Con M10 10 MEQ Oral Tablet Extended Release;; RPT TraMADol HCl - 50 MG Oral Tablet;; RPT Omeprazole 20 MG Oral Capsule Delayed Release;; RPT Zovirax CREA;; RPT MetFORMIN HCl ER 500 MG Oral Tablet Extended Release 24 Hour;; RPT Metoprolol Tartrate 25 MG  Oral Tablet;; RPT AmLODIPine Besylate 5 MG Oral Tablet;; RPT Advair Diskus 250-50 MCG/DOSE Inhalation Aerosol Powder Breath Activated;; RPT OneTouch Delica Lancets 99991111 Miscellaneous;; RPT. Active Problems  Asthma   (493.90) (J45.909) High cholesterol   (272.0) (E78.00) Palate mass   (784.2) (R22.0). PMH  History of malignant neoplasm of colon (V10.05) (Z85.038). [place]  Gastric Surgery Hysterectomy Oral Surgery. Family Hx  Family history of diabetes mellitus (V18.0) (Z83.3) Family history of malignant neoplasm (V16.9) (Z80.9) No pertinent family history: Mother Seasonal allergies (J30.2). Personal Hx  Caffeine use (V49.89) (F15.90) Former smoker [phone]) [phone]). Signature  Electronically signed by : Izora Gala  M.D.; 01/13/2016 11:20 AM EST.

## 2016-01-15 ENCOUNTER — Ambulatory Visit
Admission: RE | Admit: 2016-01-15 | Discharge: 2016-01-15 | Disposition: A | Discharge status: Home / Self Care | Payer: Medicare Other | Source: Ambulatory Visit | Attending: Otolaryngology | Admitting: Otolaryngology

## 2016-01-15 ENCOUNTER — Encounter: Payer: Self-pay | Admitting: Pulmonary Disease

## 2016-01-15 ENCOUNTER — Ambulatory Visit (INDEPENDENT_AMBULATORY_CARE_PROVIDER_SITE_OTHER): Payer: Medicare Other | Admitting: Pulmonary Disease

## 2016-01-15 VITALS — BP 120/68 | HR 82 | Ht 62.0 in | Wt 150.8 lb

## 2016-01-15 DIAGNOSIS — C069 Malignant neoplasm of mouth, unspecified: Secondary | ICD-10-CM | POA: Diagnosis not present

## 2016-01-15 DIAGNOSIS — R911 Solitary pulmonary nodule: Secondary | ICD-10-CM | POA: Diagnosis not present

## 2016-01-15 DIAGNOSIS — J4530 Mild persistent asthma, uncomplicated: Secondary | ICD-10-CM | POA: Diagnosis not present

## 2016-01-15 MED ORDER — IOPAMIDOL (ISOVUE-300) INJECTION 61%
100.0000 mL | Freq: Once | INTRAVENOUS | Status: AC | PRN
  Administered 2016-01-15: 100 mL via INTRAVENOUS

## 2016-01-15 NOTE — Assessment & Plan Note (Signed)
Continue Advair 250/50 twice daily She has 1-2 flareups in a year of asthmatic bronchitis GERD appears well controlled

## 2016-01-15 NOTE — Assessment & Plan Note (Signed)
Follow-up CT scan in one year

## 2016-01-15 NOTE — Progress Notes (Signed)
   Subjective:    Patient ID: [name] [name], female    DOB: [birth year], 70 y.o.   MRN: [id number]  HPI  70 yo AAF, ex smoker with asthmatic bronchitis, GERD and a component of VCD.  Worse during spring, fall & extreme heat. On xopenex MDI due to after taste & palpitations with albuterol   12/2011 increased advair to 250/50  12/2012 Spirometry >> FEv1 84%    01/15/2016  Chief Complaint  Patient presents with  . Follow-up    breathing doing well.  no concerns.   Right upper mouth cancer. Recent diagnosis with biopsy on 11/27/2015 She is scheduled for surgery soon by Dr. Constance Holster  Asthma is well controlled-she had a flareup in October 2016 with subsided with medications No nocturnal symptoms or wheezing Reviewed CT imaging studies CT chest shows 3 mm right lower lobe nodule    Past Medical History  Diagnosis Date  . Psoriasis   . Eczema   . DIABETES MELLITUS 01/30/2008  . GOITER, MULTINODULAR 05/21/2010  . GLUCOSE INTOLERANCE 09/03/2007  . HYPERCHOLESTEROLEMIA 09/03/2007  . HYPERTENSION 09/03/2007  . CORONARY ARTERY DISEASE 09/03/2007  . ASTHMA 09/03/2007  . COPD 09/03/2007  . GERD 09/03/2007  . OBESITY 09/03/2007  . DEGENERATIVE JOINT DISEASE 01/30/2008  . ARTHRITIS 01/30/2008  . COLON CANCER 04/07/2008    pt states she had colon cancer 1987  . LVH (left ventricular hypertrophy)   . Osteopenia   . Impaired glucose tolerance 04/10/2011  . COPD (chronic obstructive pulmonary disease) ([place]) 04/22/2013  . Diabetic peripheral neuropathy ([place]) 04/22/2013  . Shortness of breath dyspnea     With exertion and allergies to pollen  . History of blood transfusion   . Cancer ([place]) 1987    Review of Systems neg for any significant sore throat, dysphagia, itching, sneezing, nasal congestion or excess/ purulent secretions, fever, chills, sweats, unintended wt loss, pleuritic or exertional cp, hempoptysis, orthopnea pnd or change in chronic leg swelling. Also denies presyncope, palpitations,  heartburn, abdominal pain, nausea, vomiting, diarrhea or change in bowel or urinary habits, dysuria,hematuria, rash, arthralgias, visual complaints, headache, numbness weakness or ataxia.     Objective:   Physical Exam  Gen. Pleasant, well-nourished, in no distress ENT - no lesions, no post nasal drip Neck: No JVD, no thyromegaly, no carotid bruits Lungs: no use of accessory muscles, no dullness to percussion, clear without rales or rhonchi  Cardiovascular: Rhythm regular, heart sounds  normal, no murmurs or gallops, no peripheral edema Musculoskeletal: No deformities, no cyanosis or clubbing        Assessment & Plan:

## 2016-01-15 NOTE — Patient Instructions (Signed)
Stay on advair twice daily Repeat Ct scan in 1 year for lung nodules Good luck with cancer surgery

## 2016-01-19 ENCOUNTER — Other Ambulatory Visit (HOSPITAL_COMMUNITY): Payer: Medicare Other | Admitting: Dentistry

## 2016-01-21 ENCOUNTER — Encounter (HOSPITAL_COMMUNITY): Payer: Self-pay | Admitting: *Deleted

## 2016-01-21 MED ORDER — OXYMETAZOLINE HCL 0.05 % NA SOLN
2.0000 | NASAL | Status: DC
  Administered 2016-01-22: 2 via NASAL

## 2016-01-21 MED ORDER — CEFAZOLIN SODIUM-DEXTROSE 2-3 GM-% IV SOLR: 2.0000 g | INTRAVENOUS | Status: DC

## 2016-01-21 NOTE — Progress Notes (Signed)
Pt denies SOB, chest pain, and being under the care of a cardiologist. Pt stated that a stress test was done " about 10 years ago." Pt all takes BP medications at night and stated that she does not take Metoprolol BID as indicated, she takes it only at night. She stated that she takes her thyroid medication at noon and Tramadol at 4:00 P.M. daily. Pt wishes to hold Statin the morning of surgery. Pt made aware to stop taking Aspirin, Claritin -D, otc vitamins, fish oil and herbal medications. Do not take any NSAIDs ie: Ibuprofen, Advil, Naproxen or any medication containing Aspirin. Pt made aware to not take ant Metformin the morning of procedure. Pt verbalized understanding of all pre-op instructions.

## 2016-01-22 ENCOUNTER — Inpatient Hospital Stay (HOSPITAL_COMMUNITY): Payer: Medicare Other

## 2016-01-22 ENCOUNTER — Encounter (HOSPITAL_COMMUNITY): Payer: Self-pay | Admitting: *Deleted

## 2016-01-22 ENCOUNTER — Inpatient Hospital Stay (HOSPITAL_COMMUNITY): Payer: Medicare Other | Admitting: Certified Registered Nurse Anesthetist

## 2016-01-22 ENCOUNTER — Encounter (HOSPITAL_COMMUNITY)
Admission: RE | Disposition: A | Discharge status: Home / Self Care | Payer: Self-pay | Source: Ambulatory Visit | Attending: Otolaryngology

## 2016-01-22 ENCOUNTER — Inpatient Hospital Stay (HOSPITAL_COMMUNITY)
Admission: RE | Admit: 2016-01-22 | Discharge: 2016-01-25 | DRG: 132 | Disposition: A | Discharge status: Home / Self Care | Payer: Medicare Other | Source: Ambulatory Visit | Attending: Otolaryngology | Admitting: Otolaryngology

## 2016-01-22 DIAGNOSIS — Z4659 Encounter for fitting and adjustment of other gastrointestinal appliance and device: Secondary | ICD-10-CM

## 2016-01-22 DIAGNOSIS — Z87891 Personal history of nicotine dependence: Secondary | ICD-10-CM

## 2016-01-22 DIAGNOSIS — J449 Chronic obstructive pulmonary disease, unspecified: Secondary | ICD-10-CM | POA: Diagnosis not present

## 2016-01-22 DIAGNOSIS — Z85038 Personal history of other malignant neoplasm of large intestine: Secondary | ICD-10-CM | POA: Diagnosis not present

## 2016-01-22 DIAGNOSIS — Z79899 Other long term (current) drug therapy: Secondary | ICD-10-CM | POA: Diagnosis not present

## 2016-01-22 DIAGNOSIS — E7800 Pure hypercholesterolemia, unspecified: Secondary | ICD-10-CM | POA: Diagnosis present

## 2016-01-22 DIAGNOSIS — Z7982 Long term (current) use of aspirin: Secondary | ICD-10-CM

## 2016-01-22 DIAGNOSIS — Z7952 Long term (current) use of systemic steroids: Secondary | ICD-10-CM | POA: Diagnosis not present

## 2016-01-22 DIAGNOSIS — C490 Malignant neoplasm of connective and soft tissue of head, face and neck: Secondary | ICD-10-CM | POA: Diagnosis not present

## 2016-01-22 DIAGNOSIS — Z7984 Long term (current) use of oral hypoglycemic drugs: Secondary | ICD-10-CM

## 2016-01-22 DIAGNOSIS — C069 Malignant neoplasm of mouth, unspecified: Secondary | ICD-10-CM | POA: Diagnosis not present

## 2016-01-22 DIAGNOSIS — Z452 Encounter for adjustment and management of vascular access device: Secondary | ICD-10-CM | POA: Diagnosis not present

## 2016-01-22 DIAGNOSIS — Z90710 Acquired absence of both cervix and uterus: Secondary | ICD-10-CM

## 2016-01-22 DIAGNOSIS — Z809 Family history of malignant neoplasm, unspecified: Secondary | ICD-10-CM | POA: Diagnosis not present

## 2016-01-22 DIAGNOSIS — C310 Malignant neoplasm of maxillary sinus: Secondary | ICD-10-CM | POA: Diagnosis not present

## 2016-01-22 DIAGNOSIS — C410 Malignant neoplasm of bones of skull and face: Secondary | ICD-10-CM | POA: Diagnosis not present

## 2016-01-22 DIAGNOSIS — J45909 Unspecified asthma, uncomplicated: Secondary | ICD-10-CM | POA: Diagnosis present

## 2016-01-22 DIAGNOSIS — Z833 Family history of diabetes mellitus: Secondary | ICD-10-CM | POA: Diagnosis not present

## 2016-01-22 HISTORY — PX: MAXILLECTOMY: SHX5207

## 2016-01-22 LAB — GLUCOSE, CAPILLARY
GLUCOSE-CAPILLARY: 114 mg/dL — AB (ref 65–99)
Glucose-Capillary: 118 mg/dL — ABNORMAL HIGH (ref 65–99)
Glucose-Capillary: 172 mg/dL — ABNORMAL HIGH (ref 65–99)

## 2016-01-22 LAB — CBC
HCT: 36.2 % (ref 36.0–46.0)
Hemoglobin: 11.4 g/dL — ABNORMAL LOW (ref 12.0–15.0)
MCH: 25.2 pg — AB (ref 26.0–34.0)
MCHC: 31.5 g/dL (ref 30.0–36.0)
MCV: 80.1 fL (ref 78.0–100.0)
PLATELETS: 213 10*3/uL (ref 150–400)
RBC: 4.52 MIL/uL (ref 3.87–5.11)
RDW: 16.7 % — ABNORMAL HIGH (ref 11.5–15.5)
WBC: 10.3 10*3/uL (ref 4.0–10.5)

## 2016-01-22 LAB — BASIC METABOLIC PANEL
Anion gap: 9 (ref 5–15)
BUN: 13 mg/dL (ref 6–20)
CHLORIDE: 107 mmol/L (ref 101–111)
CO2: 26 mmol/L (ref 22–32)
CREATININE: 0.67 mg/dL (ref 0.44–1.00)
Calcium: 9 mg/dL (ref 8.9–10.3)
GFR calc non Af Amer: >60 mL/min (ref >60)
Glucose, Bld: 118 mg/dL — ABNORMAL HIGH (ref 65–99)
Potassium: 4 mmol/L (ref 3.5–5.1)
Sodium: 142 mmol/L (ref 135–145)

## 2016-01-22 SURGERY — MAXILLECTOMY
Anesthesia: General | Laterality: Right

## 2016-01-22 MED ORDER — HYDROCODONE-ACETAMINOPHEN 7.5-325 MG/15ML PO SOLN
10.0000 mL | ORAL | Status: DC | PRN
  Administered 2016-01-22 – 2016-01-25 (×7): 15 mL via ORAL
  Filled 2016-01-22 (×6): qty 15

## 2016-01-22 MED ORDER — AMLODIPINE BESYLATE 5 MG PO TABS
5.0000 mg | ORAL_TABLET | Freq: Every day | ORAL | Status: DC
  Administered 2016-01-23 – 2016-01-25 (×3): 5 mg via ORAL
  Filled 2016-01-22 (×3): qty 1

## 2016-01-22 MED ORDER — KETOCONAZOLE 2 % EX CREA
TOPICAL_CREAM | Freq: Three times a day (TID) | CUTANEOUS | Status: DC
  Administered 2016-01-22 – 2016-01-24 (×4): via TOPICAL
  Filled 2016-01-22: qty 15

## 2016-01-22 MED ORDER — DEXAMETHASONE SODIUM PHOSPHATE 4 MG/ML IJ SOLN
INTRAMUSCULAR | Status: AC
  Filled 2016-01-22: qty 4

## 2016-01-22 MED ORDER — LIDOCAINE HCL (CARDIAC) 20 MG/ML IV SOLN
INTRAVENOUS | Status: AC
  Filled 2016-01-22: qty 5

## 2016-01-22 MED ORDER — PRAVASTATIN SODIUM 10 MG PO TABS
10.0000 mg | ORAL_TABLET | Freq: Every day | ORAL | Status: DC
  Administered 2016-01-23 – 2016-01-24 (×2): 10 mg via ORAL
  Filled 2016-01-22 (×2): qty 1

## 2016-01-22 MED ORDER — METFORMIN HCL ER 500 MG PO TB24
1000.0000 mg | ORAL_TABLET | Freq: Every day | ORAL | Status: DC
  Administered 2016-01-23 – 2016-01-25 (×3): 1000 mg via ORAL
  Filled 2016-01-22 (×3): qty 2

## 2016-01-22 MED ORDER — POTASSIUM CHLORIDE ER 10 MEQ PO TBCR
10.0000 meq | EXTENDED_RELEASE_TABLET | Freq: Every day | ORAL | Status: DC
  Administered 2016-01-23 – 2016-01-25 (×3): 10 meq via ORAL
  Filled 2016-01-22 (×7): qty 1

## 2016-01-22 MED ORDER — LACTATED RINGERS IV SOLN
INTRAVENOUS | Status: DC
  Administered 2016-01-22 (×2): via INTRAVENOUS

## 2016-01-22 MED ORDER — CEFAZOLIN SODIUM-DEXTROSE 2-3 GM-% IV SOLR
INTRAVENOUS | Status: AC
  Administered 2016-01-22: 2 g via INTRAVENOUS
  Filled 2016-01-22: qty 50

## 2016-01-22 MED ORDER — SODIUM CHLORIDE 0.9 % IJ SOLN
INTRAMUSCULAR | Status: AC
  Filled 2016-01-22: qty 10

## 2016-01-22 MED ORDER — LORATADINE 10 MG PO TABS
10.0000 mg | ORAL_TABLET | Freq: Every day | ORAL | Status: DC
  Administered 2016-01-23 – 2016-01-25 (×3): 10 mg via ORAL
  Filled 2016-01-22 (×3): qty 1

## 2016-01-22 MED ORDER — PROPOFOL 10 MG/ML IV BOLUS
INTRAVENOUS | Status: DC | PRN
  Administered 2016-01-22: 100 mg via INTRAVENOUS

## 2016-01-22 MED ORDER — ONDANSETRON HCL 4 MG/2ML IJ SOLN: 4.0000 mg | INTRAMUSCULAR | Status: DC | PRN

## 2016-01-22 MED ORDER — 0.9 % SODIUM CHLORIDE (POUR BTL) OPTIME
TOPICAL | Status: DC | PRN
  Administered 2016-01-22: 1000 mL

## 2016-01-22 MED ORDER — ASPIRIN EC 325 MG PO TBEC
325.0000 mg | DELAYED_RELEASE_TABLET | Freq: Every day | ORAL | Status: DC
  Administered 2016-01-23 – 2016-01-25 (×3): 325 mg via ORAL
  Filled 2016-01-22 (×3): qty 1

## 2016-01-22 MED ORDER — ONDANSETRON 4 MG PO TBDP: 4.0000 mg | ORAL_TABLET | Freq: Three times a day (TID) | ORAL | Status: DC | PRN

## 2016-01-22 MED ORDER — SUCCINYLCHOLINE CHLORIDE 20 MG/ML IJ SOLN
INTRAMUSCULAR | Status: DC | PRN
  Administered 2016-01-22: 100 mg via INTRAVENOUS

## 2016-01-22 MED ORDER — CLOBETASOL PROPIONATE 0.05 % EX OINT
1.0000 "application " | TOPICAL_OINTMENT | Freq: Two times a day (BID) | CUTANEOUS | Status: DC
  Administered 2016-01-23 – 2016-01-24 (×4): 1 via TOPICAL
  Filled 2016-01-22: qty 15

## 2016-01-22 MED ORDER — MIDAZOLAM HCL 2 MG/2ML IJ SOLN
INTRAMUSCULAR | Status: AC
  Filled 2016-01-22: qty 2

## 2016-01-22 MED ORDER — DEXAMETHASONE SODIUM PHOSPHATE 4 MG/ML IJ SOLN
INTRAMUSCULAR | Status: DC | PRN
  Administered 2016-01-22: 4 mg via INTRAVENOUS

## 2016-01-22 MED ORDER — FLUOCINOLONE ACETONIDE 0.025 % EX OINT: 1.0000 "application " | TOPICAL_OINTMENT | Freq: Two times a day (BID) | CUTANEOUS | Status: DC | PRN

## 2016-01-22 MED ORDER — ROCURONIUM BROMIDE 50 MG/5ML IV SOLN
INTRAVENOUS | Status: AC
  Filled 2016-01-22: qty 1

## 2016-01-22 MED ORDER — ONDANSETRON HCL 4 MG/2ML IJ SOLN: 4.0000 mg | Freq: Once | INTRAMUSCULAR | Status: DC | PRN

## 2016-01-22 MED ORDER — OXYMETAZOLINE HCL 0.05 % NA SOLN
NASAL | Status: AC
  Filled 2016-01-22: qty 15

## 2016-01-22 MED ORDER — HYDROCODONE-ACETAMINOPHEN 7.5-325 MG PO TABS: 1.0000 | ORAL_TABLET | Freq: Four times a day (QID) | ORAL | Status: DC | PRN

## 2016-01-22 MED ORDER — NAPROXEN SODIUM 275 MG PO TABS
275.0000 mg | ORAL_TABLET | Freq: Every day | ORAL | Status: DC | PRN
  Filled 2016-01-22: qty 1

## 2016-01-22 MED ORDER — PHENYLEPHRINE HCL 10 MG/ML IJ SOLN
10.0000 mg | INTRAVENOUS | Status: DC | PRN
  Administered 2016-01-22: 20 ug/min via INTRAVENOUS

## 2016-01-22 MED ORDER — OXYMETAZOLINE HCL 0.05 % NA SOLN
NASAL | Status: AC
  Administered 2016-01-22: 2 via NASAL
  Filled 2016-01-22: qty 15

## 2016-01-22 MED ORDER — FENTANYL CITRATE (PF) 100 MCG/2ML IJ SOLN
INTRAMUSCULAR | Status: AC
  Administered 2016-01-22: 50 ug
  Filled 2016-01-22: qty 2

## 2016-01-22 MED ORDER — ROCURONIUM BROMIDE 100 MG/10ML IV SOLN
INTRAVENOUS | Status: DC | PRN
  Administered 2016-01-22: 20 mg via INTRAVENOUS

## 2016-01-22 MED ORDER — ONDANSETRON HCL 4 MG/2ML IJ SOLN
INTRAMUSCULAR | Status: DC | PRN
  Administered 2016-01-22: 4 mg via INTRAVENOUS

## 2016-01-22 MED ORDER — DEXTROSE-NACL 5-0.9 % IV SOLN
INTRAVENOUS | Status: DC
  Administered 2016-01-22 – 2016-01-24 (×5): via INTRAVENOUS

## 2016-01-22 MED ORDER — HYDROCODONE-ACETAMINOPHEN 7.5-325 MG/15ML PO SOLN
ORAL | Status: AC
  Filled 2016-01-22: qty 15

## 2016-01-22 MED ORDER — FENTANYL CITRATE (PF) 250 MCG/5ML IJ SOLN
INTRAMUSCULAR | Status: AC
  Filled 2016-01-22: qty 5

## 2016-01-22 MED ORDER — ONETOUCH DELICA LANCETS 33G MISC: 1.0000 | Freq: Every day | Status: DC

## 2016-01-22 MED ORDER — PROPOFOL 10 MG/ML IV BOLUS
INTRAVENOUS | Status: AC
  Filled 2016-01-22: qty 20

## 2016-01-22 MED ORDER — OXYCODONE HCL 5 MG/5ML PO SOLN: 5.0000 mg | Freq: Once | ORAL | Status: DC | PRN

## 2016-01-22 MED ORDER — EPHEDRINE SULFATE 50 MG/ML IJ SOLN
INTRAMUSCULAR | Status: AC
  Filled 2016-01-22: qty 1

## 2016-01-22 MED ORDER — TIZANIDINE HCL 2 MG PO TABS: 4.0000 mg | ORAL_TABLET | Freq: Four times a day (QID) | ORAL | Status: DC | PRN

## 2016-01-22 MED ORDER — LIDOCAINE HCL 4 % MT SOLN
OROMUCOSAL | Status: DC | PRN
  Administered 2016-01-22: 4 mL via TOPICAL

## 2016-01-22 MED ORDER — HYDROCHLOROTHIAZIDE 25 MG PO TABS
25.0000 mg | ORAL_TABLET | Freq: Every day | ORAL | Status: DC
  Administered 2016-01-23 – 2016-01-25 (×3): 25 mg via ORAL
  Filled 2016-01-22 (×3): qty 1

## 2016-01-22 MED ORDER — ALBUTEROL SULFATE HFA 108 (90 BASE) MCG/ACT IN AERS
2.0000 | INHALATION_SPRAY | Freq: Four times a day (QID) | RESPIRATORY_TRACT | Status: DC | PRN
Start: 2016-01-22 — End: 2016-01-22

## 2016-01-22 MED ORDER — TRAMADOL HCL 50 MG PO TABS
50.0000 mg | ORAL_TABLET | Freq: Every day | ORAL | Status: DC
  Administered 2016-01-23 – 2016-01-25 (×3): 50 mg via ORAL
  Filled 2016-01-22 (×3): qty 1

## 2016-01-22 MED ORDER — LIDOCAINE-EPINEPHRINE 1 %-1:100000 IJ SOLN
INTRAMUSCULAR | Status: DC | PRN
  Administered 2016-01-22: 5 mL

## 2016-01-22 MED ORDER — LIDOCAINE HCL (CARDIAC) 20 MG/ML IV SOLN
INTRAVENOUS | Status: DC | PRN
  Administered 2016-01-22: 60 mg via INTRAVENOUS

## 2016-01-22 MED ORDER — ONETOUCH ULTRA SYSTEM W/DEVICE KIT: 1.0000 | PACK | Freq: Once | Status: DC

## 2016-01-22 MED ORDER — EPHEDRINE SULFATE 50 MG/ML IJ SOLN
INTRAMUSCULAR | Status: DC | PRN
  Administered 2016-01-22: 10 mg via INTRAVENOUS

## 2016-01-22 MED ORDER — CLINDAMYCIN HCL 300 MG PO CAPS: 300.0000 mg | ORAL_CAPSULE | Freq: Three times a day (TID) | ORAL | Status: DC

## 2016-01-22 MED ORDER — BACITRACIN ZINC 500 UNIT/GM EX OINT
TOPICAL_OINTMENT | CUTANEOUS | Status: AC
  Filled 2016-01-22: qty 28.35

## 2016-01-22 MED ORDER — METHIMAZOLE 5 MG PO TABS
5.0000 mg | ORAL_TABLET | ORAL | Status: DC
  Administered 2016-01-25: 5 mg via ORAL
  Filled 2016-01-22 (×2): qty 1

## 2016-01-22 MED ORDER — OXYMETAZOLINE HCL 0.05 % NA SOLN
NASAL | Status: DC | PRN
  Administered 2016-01-22: 1 via TOPICAL

## 2016-01-22 MED ORDER — MORPHINE SULFATE (PF) 2 MG/ML IV SOLN
2.0000 mg | INTRAVENOUS | Status: DC | PRN
  Administered 2016-01-22 – 2016-01-24 (×8): 2 mg via INTRAVENOUS
  Filled 2016-01-22 (×8): qty 1

## 2016-01-22 MED ORDER — OXYCODONE HCL 5 MG PO TABS: 5.0000 mg | ORAL_TABLET | Freq: Once | ORAL | Status: DC | PRN

## 2016-01-22 MED ORDER — LORATADINE-PSEUDOEPHEDRINE ER 10-240 MG PO TB24: 1.0000 | ORAL_TABLET | Freq: Every day | ORAL | Status: DC

## 2016-01-22 MED ORDER — FENTANYL CITRATE (PF) 100 MCG/2ML IJ SOLN
INTRAMUSCULAR | Status: AC
  Filled 2016-01-22: qty 2

## 2016-01-22 MED ORDER — GLUCOSE BLOOD VI STRP: 1.0000 | ORAL_STRIP | Freq: Every day | Status: DC

## 2016-01-22 MED ORDER — HEMOSTATIC AGENTS (NO CHARGE) OPTIME
TOPICAL | Status: DC | PRN
  Administered 2016-01-22: 1 via TOPICAL

## 2016-01-22 MED ORDER — FENTANYL CITRATE (PF) 100 MCG/2ML IJ SOLN
INTRAMUSCULAR | Status: DC | PRN
  Administered 2016-01-22 (×6): 50 ug via INTRAVENOUS

## 2016-01-22 MED ORDER — ALBUTEROL SULFATE (2.5 MG/3ML) 0.083% IN NEBU: 2.5000 mg | INHALATION_SOLUTION | Freq: Four times a day (QID) | RESPIRATORY_TRACT | Status: DC | PRN

## 2016-01-22 MED ORDER — CLINDAMYCIN PHOSPHATE 300 MG/50ML IV SOLN
300.0000 mg | Freq: Three times a day (TID) | INTRAVENOUS | Status: DC
  Administered 2016-01-22 – 2016-01-25 (×9): 300 mg via INTRAVENOUS
  Filled 2016-01-22 (×11): qty 50

## 2016-01-22 MED ORDER — PANTOPRAZOLE SODIUM 40 MG PO TBEC
40.0000 mg | DELAYED_RELEASE_TABLET | Freq: Every day | ORAL | Status: DC
  Administered 2016-01-23 – 2016-01-25 (×3): 40 mg via ORAL
  Filled 2016-01-22 (×3): qty 1

## 2016-01-22 MED ORDER — FENTANYL CITRATE (PF) 100 MCG/2ML IJ SOLN
25.0000 ug | INTRAMUSCULAR | Status: DC | PRN
  Administered 2016-01-22 (×2): 50 ug via INTRAVENOUS

## 2016-01-22 MED ORDER — ONDANSETRON HCL 4 MG PO TABS: 4.0000 mg | ORAL_TABLET | ORAL | Status: DC | PRN

## 2016-01-22 MED ORDER — METOPROLOL TARTRATE 25 MG PO TABS
25.0000 mg | ORAL_TABLET | Freq: Two times a day (BID) | ORAL | Status: DC
  Administered 2016-01-22 – 2016-01-25 (×6): 25 mg via ORAL
  Filled 2016-01-22 (×6): qty 1

## 2016-01-22 MED ORDER — LIDOCAINE-EPINEPHRINE 1 %-1:100000 IJ SOLN
INTRAMUSCULAR | Status: AC
Start: 2016-01-22 — End: 2016-01-22
  Filled 2016-01-22: qty 1

## 2016-01-22 MED ORDER — MOMETASONE FURO-FORMOTEROL FUM 100-5 MCG/ACT IN AERO
2.0000 | INHALATION_SPRAY | Freq: Two times a day (BID) | RESPIRATORY_TRACT | Status: DC
  Administered 2016-01-22 – 2016-01-25 (×6): 2 via RESPIRATORY_TRACT
  Filled 2016-01-22: qty 8.8

## 2016-01-22 MED ORDER — PSEUDOEPHEDRINE HCL ER 120 MG PO TB12
120.0000 mg | ORAL_TABLET | Freq: Two times a day (BID) | ORAL | Status: DC
Start: 2016-01-22 — End: 2016-01-23
  Administered 2016-01-22 – 2016-01-23 (×2): 120 mg via ORAL
  Filled 2016-01-22 (×3): qty 1

## 2016-01-22 SURGICAL SUPPLY — 44 items
BENZOIN TINCTURE PRP APPL 2/3 (GAUZE/BANDAGES/DRESSINGS) ×2 IMPLANT
BLADE SURG 15 STRL LF DISP TIS (BLADE) ×1 IMPLANT
BLADE SURG 15 STRL SS (BLADE) ×1
CANISTER SUCTION 2500CC (MISCELLANEOUS) ×2 IMPLANT
CLEANER TIP ELECTROSURG 2X2 (MISCELLANEOUS) ×2 IMPLANT
CLSR STERI-STRIP ANTIMIC 1/2X4 (GAUZE/BANDAGES/DRESSINGS) ×2 IMPLANT
CONT SPEC 4OZ CLIKSEAL STRL BL (MISCELLANEOUS) ×12 IMPLANT
CORDS BIPOLAR (ELECTRODE) ×2 IMPLANT
COVER SURGICAL LIGHT HANDLE (MISCELLANEOUS) ×2 IMPLANT
DRAPE PROXIMA HALF (DRAPES) IMPLANT
ELECT COATED BLADE 2.86 ST (ELECTRODE) ×2 IMPLANT
ELECT REM PT RETURN 9FT ADLT (ELECTROSURGICAL) ×2
ELECTRODE REM PT RTRN 9FT ADLT (ELECTROSURGICAL) ×1 IMPLANT
FORCEPS BIPOLAR SPETZLER 8 1.0 (NEUROSURGERY SUPPLIES) ×2 IMPLANT
GAUZE PACKING IODOFORM 1X5 (MISCELLANEOUS) ×2 IMPLANT
GAUZE SPONGE 4X4 16PLY XRAY LF (GAUZE/BANDAGES/DRESSINGS) ×2 IMPLANT
GLOVE BIO SURGEON STRL SZ 6.5 (GLOVE) ×2 IMPLANT
GLOVE BIOGEL PI IND STRL 7.0 (GLOVE) ×2 IMPLANT
GLOVE BIOGEL PI IND STRL 7.5 (GLOVE) ×1 IMPLANT
GLOVE BIOGEL PI INDICATOR 7.0 (GLOVE) ×2
GLOVE BIOGEL PI INDICATOR 7.5 (GLOVE) ×1
GLOVE ECLIPSE 7.5 STRL STRAW (GLOVE) ×4 IMPLANT
GLOVE SURG SS PI 6.5 STRL IVOR (GLOVE) ×4 IMPLANT
GLOVE SURG SS PI 7.0 STRL IVOR (GLOVE) ×2 IMPLANT
GOWN STRL REUS W/ TWL LRG LVL3 (GOWN DISPOSABLE) ×4 IMPLANT
GOWN STRL REUS W/TWL LRG LVL3 (GOWN DISPOSABLE) ×4
HEMOSTAT SURGICEL 2X14 (HEMOSTASIS) ×2 IMPLANT
KIT BASIN OR (CUSTOM PROCEDURE TRAY) ×2 IMPLANT
KIT ROOM TURNOVER OR (KITS) ×2 IMPLANT
NEEDLE 27GAX1X1/2 (NEEDLE) ×2 IMPLANT
NS IRRIG 1000ML POUR BTL (IV SOLUTION) ×2 IMPLANT
PAD ARMBOARD 7.5X6 YLW CONV (MISCELLANEOUS) ×4 IMPLANT
PATTIES SURGICAL .5 X3 (DISPOSABLE) ×2 IMPLANT
PENCIL FOOT CONTROL (ELECTRODE) ×2 IMPLANT
SOLUTION ANTI FOG 6CC (MISCELLANEOUS) ×2 IMPLANT
SUT BONE WAX W31G (SUTURE) ×2 IMPLANT
SUT SILK 2 0 SH (SUTURE) ×2 IMPLANT
SUT SILK 2 0 SH CR/8 (SUTURE) ×2 IMPLANT
SUT VIC AB 3-0 FS2 27 (SUTURE) ×4 IMPLANT
TOWEL OR 17X24 6PK STRL BLUE (TOWEL DISPOSABLE) ×2 IMPLANT
TRAY ENT MC OR (CUSTOM PROCEDURE TRAY) ×2 IMPLANT
TUBE CONNECTING 12X1/4 (SUCTIONS) ×2 IMPLANT
TUBE FEEDING 10FR FLEXIFLO (MISCELLANEOUS) ×2 IMPLANT
TUBING EXTENTION W/L.L. (IV SETS) ×2 IMPLANT

## 2016-01-22 NOTE — H&P (View-Only) (Signed)
  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 Orders  Dental Consult; Requested for: 13 Jan 2016. CT Neck with contrast; Requested for: 13 Jan 2016. CT Chest with contrast; Requested for: 13 Jan 2016. CT Abdomen with contrast; Requested for: 13 Jan 2016. Discussed  Here today with her family to discuss the results of the biopsy. Her husband, daughter and daughter-in-law are all in attendance. The biopsy revealed a low-grade adenocarcinoma. The optimal treatment for this is complete surgical excision with adequate margins. This will involve a partial maxillectomy. This will be performed with a combined oral and sublabial approach. Upper lip incision may be necessary. She will need an obturator and a NG feeding tube post op.    The case was discussed at tumor board this morning. Recommendation was to check chest and abdomen for any evidence of meastatic disease with CT. Post op radiation may or may not be necessary.   She will also need dental eval for obturator.   All questions were answered. Reason For Visit  Discuss biopsy result. Allergies  No Known Drug Allergies. Current Meds  Naproxen 500 MG Oral Tablet;; RPT PredniSONE 10 MG Oral Tablet;; RPT HydroCHLOROthiazide 25 MG Oral Tablet;; RPT Hydrocodone-Homatropine 5-1.5 MG/5ML Oral Syrup;TAKE 5 MLS BY MOUTH EVERY 6 HOURS AS NEEDED FOR COUGH; RPT Augmentin 875-125 MG Oral Tablet (Amoxicillin-Pot Clavulanate);; RPT Clobetasol Propionate 0.05 % External Ointment;; RPT MethIMAzole 5 MG Oral Tablet;; RPT Claritin TABS;; RPT Fluocinonide OINT;; RPT Albuterol AERS;; RPT Aspir-81 TBEC;; RPT Lovastatin 40 MG Oral Tablet;; RPT OneTouch Ultra Blue In Vitro Strip;; RPT Klor-Con M10 10 MEQ Oral Tablet Extended Release;; RPT TraMADol HCl - 50 MG Oral Tablet;; RPT Omeprazole 20 MG Oral Capsule Delayed Release;; RPT Zovirax CREA;; RPT MetFORMIN HCl ER 500 MG Oral Tablet Extended Release 24 Hour;; RPT Metoprolol Tartrate 25 MG  Oral Tablet;; RPT AmLODIPine Besylate 5 MG Oral Tablet;; RPT Advair Diskus 250-50 MCG/DOSE Inhalation Aerosol Powder Breath Activated;; RPT OneTouch Delica Lancets 99991111 Miscellaneous;; RPT. Active Problems  Asthma   (493.90) (J45.909) High cholesterol   (272.0) (E78.00) Palate mass   (784.2) (R22.0). PMH  History of malignant neoplasm of colon (V10.05) (Z85.038). [place]  Gastric Surgery Hysterectomy Oral Surgery. Family Hx  Family history of diabetes mellitus (V18.0) (Z83.3) Family history of malignant neoplasm (V16.9) (Z80.9) No pertinent family history: Mother Seasonal allergies (J30.2). Personal Hx  Caffeine use (V49.89) (F15.90) Former smoker [phone]) [phone]). Signature  Electronically signed by : Izora Gala  M.D.; 01/13/2016 11:20 AM EST.

## 2016-01-22 NOTE — Op Note (Signed)
OPERATIVE REPORT  DATE OF SURGERY: 01/22/2016  PATIENT:  [name] [name],  70 y.o. female  PRE-OPERATIVE DIAGNOSIS:  cancer of the minor salivary gland  POST-OPERATIVE DIAGNOSIS:  cancer of the minor salivary gland  PROCEDURE:  Procedure(s): RIGHT SIDE PARTIAL MAXILLECTOMY  SURGEON:  Beckie Salts, MD  ASSISTANTS: Jolene Provost PA  ANESTHESIA:   General   EBL:  300 ml  DRAINS: none  LOCAL MEDICATIONS USED:  1% Xylocaine with epinephrine  SPECIMEN:  Partial maxillectomy, with additional frozen section analysis.  COUNTS:  Correct  PROCEDURE DETAILS: The patient was taken to the operating room and placed on the operating table in the supine position. Following induction of general endotracheal anesthesia, the face was prepped and draped in a standard fashion. Local anesthetic was infiltrated into the sublabial mucosa/canine fossa. Electrocautery was used to incise the mucosa to expose the face of the maxilla. The periosteum was elevated up superiorly. The antrum was entered from the anterior wall. The 0 endoscope was used to visualize within the sinus. The tumor was identified. Mucosal marks were made along the posterior margin of the maxillary antrum, the lateral margin and the medial wall. The gingival mucosa was also infiltrated at the proposed osteotomy site which was between the premolar and the second molar. The premolar was felt to be far enough away from the tumor to preserved. The hard palate and soft palate mucosa was also incised with electrocautery. Osteotomes were used to create all of the bony cuts. Electrocautery was used to divide through the pterygoid muscles posteriorly. The lesion was removed en bloc.   Oral mucosal margins appeared to be clear. The specimen was marked with sutures and sent for pathologic dilation. Frozen sections were taken from the upper pterygoid margin, the lower pterygoid margin, the hard palate margin and the retromolar trigone margin. All were  negative except for the lower pterygoid. An additional centimeter of pterygoid musculature was taken and sent for additional frozen section which was negative. Bipolar cautery was used for hemostasis. Some Surgicel was packed up along the pterygopalatine fossa. Mucosa was reapproximated with interrupted Vicryl suture along the soft palate and the lateral pharyngeal areas. The remaining defect was packed with iodoform gauze. Silk trampoline sutures were used to hold the packing in place. The canine fossa incision was reapproximated with running chromic suture. The closure, the entire wound was irrigated with saline. A nasogastric feeding tube was placed from the left nasal cavity and secured in place with benzoin and Steri-Strips. The patient was awakened extubated and transferred to recovery in stable condition.    PATIENT DISPOSITION:  To PACU, stable

## 2016-01-22 NOTE — Anesthesia Preprocedure Evaluation (Signed)
Anesthesia Evaluation  Patient identified by MRN, date of birth, ID band Patient awake    Reviewed: Allergy & Precautions, NPO status , Patient's Chart, lab work & pertinent test results  Airway Mallampati: II  TM Distance: >3 FB Neck ROM: Full    Dental  (+) Teeth Intact   Pulmonary former smoker,    breath sounds clear to auscultation       Cardiovascular hypertension,  Rhythm:Regular Rate:Normal     Neuro/Psych    GI/Hepatic   Endo/Other  diabetes  Renal/GU      Musculoskeletal   Abdominal   Peds  Hematology   Anesthesia Other Findings   Reproductive/Obstetrics                             Anesthesia Physical Anesthesia Plan  ASA: III  Anesthesia Plan: General   Post-op Pain Management:    Induction: Intravenous  Airway Management Planned: Oral ETT  Additional Equipment:   Intra-op Plan:   Post-operative Plan:   Informed Consent: I have reviewed the patients History and Physical, chart, labs and discussed the procedure including the risks, benefits and alternatives for the proposed anesthesia with the patient or authorized representative who has indicated his/her understanding and acceptance.   Dental advisory given  Plan Discussed with: CRNA and Anesthesiologist  Anesthesia Plan Comments:         Anesthesia Quick Evaluation

## 2016-01-22 NOTE — Progress Notes (Signed)
Initial Nutrition Assessment  DOCUMENTATION CODES:   Not applicable  INTERVENTION:   -RD will follow for diet advancement and supplement as appropriate -If pt unable to tolerate PO intake and requires enteral feeding, recommend:  Initiate Jevity 1.2 @ 20 ml/hr via NGT and increase by 10 ml every 4 hours to goal rate of 60 ml/hr.   Tube feeding regimen provides 1728 kcal (100% of needs), 80 grams of protein, and 1162 ml of H2O.   NUTRITION DIAGNOSIS:   Inadequate oral intake related to inability to eat as evidenced by NPO status  GOAL:   Patient will meet greater than or equal to 90% of their needs  MONITOR:   PO intake, Diet advancement, Labs, Weight trends, Skin, I & O's  REASON FOR ASSESSMENT:   Consult Enteral/tube feeding initiation and management  ASSESSMENT:   Pt is a 70 year old fmeale with PMH which includes, psioriasis, eczema, goiter, hypercholesterolemia, HTN, CAD, asthma, COPD< DJD, and colon cancer, admitted for cancer of minor salivary gland.   S/p Procedure(s) 01/22/16: RIGHT SIDE PARTIAL MAXILLECTOMY  Hx obtained from pt and multiple family members at bedside. All reports that pt had a good appetite PTA. She consumed 2 meals per day and denied any changes to her eating patterns. They report UBW is around 150# and deny any weight loss.   Pt came from OR approximately 30 minutes to RD visit. RN feeding pt ice chips during visit, which pt requested just prior to RD visiting pt.   Nutrition-Focused physical exam completed. Findings are no fat depletion, no muscle depletion, and no edema. Per pt family, pt active and independent at baseline.   Spoke with RN, who reports that MD placed feeding tube as a precaution due to concern of being unable to tolerate PO intake due to large amount of packing in mouth. RN reports MD only wants to start TF if pt unable to tolerate PO intake. Plan is to advance slowly to soft diet and pt will likely be d/c over the weekend.  Case discussed with RN; this RD will provide TF recommendations if needed. Noted placement of NGT has been verified in the stomach.   Labs reviewed: CBGS: 114-172.   Diet Order:  DIET SOFT Room service appropriate?: Yes; Fluid consistency:: Thin  Skin:  Reviewed, no issues  Last BM:  01/22/16  Height:   Ht Readings from Last 1 Encounters:  01/22/16 5\' 2"  (1.575 m)    Weight:   Wt Readings from Last 1 Encounters:  01/22/16 150 lb 12.7 oz (68.4 kg)    Ideal Body Weight:  50 kg  BMI:  Body mass index is 27.57 kg/(m^2).  Estimated Nutritional Needs:   Kcal:  1700-1900  Protein:  80-95 grams  Fluid:  1.7-1.9 L  EDUCATION NEEDS:   No education needs identified at this time  Jakita Dutkiewicz A. Jimmye Norman, RD, LDN, CDE Pager: [phone] After hours Pager: [phone]

## 2016-01-22 NOTE — Anesthesia Procedure Notes (Signed)
Procedure Name: Intubation Date/Time: 01/22/2016 9:34 AM Performed by: Layla Maw Pre-anesthesia Checklist: Patient identified, Patient being monitored, Timeout performed, Emergency Drugs available and Suction available Patient Re-evaluated:Patient Re-evaluated prior to inductionOxygen Delivery Method: Circle System Utilized Preoxygenation: Pre-oxygenation with 100% oxygen Intubation Type: IV induction Ventilation: Mask ventilation without difficulty Laryngoscope Size: Miller and 3 Grade View: Grade II Tube type: Oral Tube size: 7.5 mm Number of attempts: 1 Airway Equipment and Method: Stylet and LTA kit utilized Placement Confirmation: ETT inserted through vocal cords under direct vision,  positive ETCO2 and breath sounds checked- equal and bilateral Secured at: 22 cm Tube secured with: Tape Dental Injury: Teeth and Oropharynx as per pre-operative assessment

## 2016-01-22 NOTE — Interval H&P Note (Signed)
History and Physical Interval Note:  01/22/2016 8:20 AM  [name] [name]  has presented today for surgery, with the diagnosis of cancer of the minor salivary gland  The various methods of treatment have been discussed with the patient and family. After consideration of risks, benefits and other options for treatment, the patient has consented to  Procedure(s): RIGHT SIDE PARTIAL MAXILLECTOMY (Right) as a surgical intervention .  The patient's history has been reviewed, patient examined, no change in status, stable for surgery.  I have reviewed the patient's chart and labs.  Questions were answered to the patient's satisfaction.     Tylicia Sherman

## 2016-01-22 NOTE — Transfer of Care (Signed)
Immediate Anesthesia Transfer of Care Note  Patient: [name] [name]  Procedure(s) Performed: Procedure(s): RIGHT SIDE PARTIAL MAXILLECTOMY (Right)  Patient Location: PACU  Anesthesia Type:General  Level of Consciousness: awake, alert , oriented and patient cooperative  Airway & Oxygen Therapy: Patient Spontanous Breathing and Patient connected to face mask oxygen  Post-op Assessment: Report given to RN, Post -op Vital signs reviewed and stable and Patient moving all extremities X 4  Post vital signs: Reviewed and stable  Last Vitals:  Filed Vitals:   01/22/16 0649 01/22/16 0803  BP: 131/84   Pulse: 70   Temp: 36.6 C   Resp:  18    Complications: No apparent anesthesia complications

## 2016-01-22 NOTE — Anesthesia Postprocedure Evaluation (Signed)
Anesthesia Post Note  Patient: [name] [name]  Procedure(s) Performed: Procedure(s) (LRB): RIGHT SIDE PARTIAL MAXILLECTOMY (Right)  Patient location during evaluation: PACU Anesthesia Type: General Level of consciousness: awake and awake and alert Pain management: pain level controlled Respiratory status: spontaneous breathing and nonlabored ventilation Anesthetic complications: no    Last Vitals:  Filed Vitals:   01/22/16 1450 01/22/16 1755  BP: 128/61 135/65  Pulse: 82 83  Temp: 36.7 C 36.7 C  Resp: 15 18    Last Pain:  Filed Vitals:   01/22/16 1758  PainSc: Asleep                 Keyara Ent COKER

## 2016-01-23 MED ORDER — PSEUDOEPHEDRINE HCL 30 MG/5ML PO SYRP
60.0000 mg | ORAL_SOLUTION | Freq: Four times a day (QID) | ORAL | Status: DC
  Administered 2016-01-23 – 2016-01-25 (×6): 60 mg
  Filled 2016-01-23 (×10): qty 10

## 2016-01-23 NOTE — Progress Notes (Signed)
1 Day Post-Op  Subjective: She is doing well taking some fluids. Pan moderate.   Objective: Vital signs in last 24 hours: Temp:  [96.3 F (35.7 C)-98.6 F (37 C)] 98.1 F (36.7 C) (02/11 0426) Pulse Rate:  [75-91] 75 (02/11 0426) Resp:  [12-19] 19 (02/11 0426) BP: (112-135)/(55-70) 112/58 mmHg (02/11 0426) SpO2:  [90 %-100 %] 100 % (02/11 0426) Weight:  [68.4 kg (150 lb 12.7 oz)-71.4 kg (157 lb 6.5 oz)] 68.4 kg (150 lb 12.7 oz) (02/10 1500) Last BM Date: 01/22/16  Intake/Output from previous day: 02/10 0701 - 02/11 0700 In: 3108.8 [P.O.:480; I.V.:2418.8; IV Piggyback:150] Out: 1100 [Urine:800; Blood:300] Intake/Output this shift:    no facial swelling. bolster intact in mouth. no excessive swelling or bleeding. cv rrr l-ctear. ext- no tenderness or swelling  Lab Results:   Recent Labs  01/22/16 0710  WBC 10.3  HGB 11.4*  HCT 36.2  PLT 213   BMET  Recent Labs  01/22/16 0710  NA 142  K 4.0  CL 107  CO2 26  GLUCOSE 118*  BUN 13  CREATININE 0.67  CALCIUM 9.0   PT/INR No results for input(s): LABPROT, INR in the last 72 hours. ABG No results for input(s): PHART, HCO3 in the last 72 hours.  Invalid input(s): PCO2, PO2  Studies/Results: Dg Abd Portable 1v  01/22/2016  CLINICAL DATA:  Check feeding catheter placement EXAM: PORTABLE ABDOMEN - 1 VIEW COMPARISON:  None. FINDINGS: The feeding catheter is noted just beyond the gastroesophageal junction in the proximal stomach. Scattered large and small bowel gas is noted. Degenerative changes of lumbar spine are seen. IMPRESSION: Feeding catheter within the proximal stomach. Electronically Signed   By: Inez Catalina M.D.   On: 01/22/2016 14:31    Anti-infectives: Anti-infectives    Start     Dose/Rate Route Frequency Ordered Stop   01/22/16 1600  clindamycin (CLEOCIN) IVPB 300 mg     300 mg 100 mL/hr over 30 Minutes Intravenous 3 times per day 01/22/16 1456     01/22/16 0830  ceFAZolin (ANCEF) IVPB 2 g/50 mL  premix  Status:  Discontinued     2 g 100 mL/hr over 30 Minutes Intravenous To ShortStay [facility] 01/21/16 1019 01/22/16 1539   01/22/16 0728  ceFAZolin (ANCEF) 2-3 GM-% IVPB SOLR    Comments:  Sammuel Cooper   : cabinet override      01/22/16 0728 01/22/16 0946   01/22/16 0000  clindamycin (CLEOCIN) 300 MG capsule     300 mg Oral 3 times daily 01/22/16 1308        Assessment/Plan: s/p Procedure(s): RIGHT SIDE PARTIAL MAXILLECTOMY (Right) she will stay until able to take fluids but may get home health to arrange tube feedings at home in case.   LOS: 1 day    Melissa Montane 01/23/2016

## 2016-01-24 NOTE — Progress Notes (Signed)
2 Days Post-Op  Subjective: She seems better and able to swallow liquids. She c/o about the feeding tube  Objective: Vital signs in last 24 hours: Temp:  [98.5 F (36.9 C)-99.3 F (37.4 C)] 98.5 F (36.9 C) (02/12 0919) Pulse Rate:  [77-91] 90 (02/12 0919) Resp:  [16-19] 18 (02/12 0919) BP: (119-139)/(60-66) 133/65 mmHg (02/12 0919) SpO2:  [94 %-99 %] 99 % (02/12 0919) Last BM Date: 01/22/16  Intake/Output from previous day: 02/11 0701 - 02/12 0700 In: 1453.3 [P.O.:462; I.V.:941.3; IV Piggyback:50] Out: 600 [Urine:600] Intake/Output this shift: Total I/O In: -  Out: 500 [Urine:500]  awake and alert wound healing well. Cv rrr l normal effort, ext- no swelling or pain  Lab Results:   Recent Labs  01/22/16 0710  WBC 10.3  HGB 11.4*  HCT 36.2  PLT 213   BMET  Recent Labs  01/22/16 0710  NA 142  K 4.0  CL 107  CO2 26  GLUCOSE 118*  BUN 13  CREATININE 0.67  CALCIUM 9.0   PT/INR No results for input(s): LABPROT, INR in the last 72 hours. ABG No results for input(s): PHART, HCO3 in the last 72 hours.  Invalid input(s): PCO2, PO2  Studies/Results: Dg Abd Portable 1v  01/22/2016  CLINICAL DATA:  Check feeding catheter placement EXAM: PORTABLE ABDOMEN - 1 VIEW COMPARISON:  None. FINDINGS: The feeding catheter is noted just beyond the gastroesophageal junction in the proximal stomach. Scattered large and small bowel gas is noted. Degenerative changes of lumbar spine are seen. IMPRESSION: Feeding catheter within the proximal stomach. Electronically Signed   By: Inez Catalina M.D.   On: 01/22/2016 14:31    Anti-infectives: Anti-infectives    Start     Dose/Rate Route Frequency Ordered Stop   01/22/16 1600  clindamycin (CLEOCIN) IVPB 300 mg     300 mg 100 mL/hr over 30 Minutes Intravenous 3 times per day 01/22/16 1456     01/22/16 0830  ceFAZolin (ANCEF) IVPB 2 g/50 mL premix  Status:  Discontinued     2 g 100 mL/hr over 30 Minutes Intravenous To ShortStay  Surgical 01/21/16 1019 01/22/16 1539   01/22/16 0728  ceFAZolin (ANCEF) 2-3 GM-% IVPB SOLR    Comments:  Sammuel Cooper   : cabinet override      01/22/16 0728 01/22/16 0946   01/22/16 0000  clindamycin (CLEOCIN) 300 MG capsule     300 mg Oral 3 times daily 01/22/16 1308        Assessment/Plan: s/p Procedure(s): RIGHT SIDE PARTIAL MAXILLECTOMY (Right) she wanted the feeding tube removed which is not being used yet. She iis able to swallow liquids so should be able to get her nutrition and crush meds. Feeding tube removed, will see how she does with drinking and getting meds in and d./c in AM if ok  LOS: 2 days    Melissa Montane 01/24/2016

## 2016-01-25 ENCOUNTER — Encounter (HOSPITAL_COMMUNITY): Payer: Self-pay | Admitting: Otolaryngology

## 2016-01-25 MED ORDER — HYDROCODONE-ACETAMINOPHEN 7.5-325 MG/15ML PO SOLN: 10.0000 mL | ORAL | Status: DC | PRN

## 2016-01-25 NOTE — Progress Notes (Signed)
3 Days Post-Op  Subjective: She is doing well. She is able to get fluids down and thus her medications. She is ready to go home.  Objective: Vital signs in last 24 hours: Temp:  [98.2 F (36.8 C)-98.6 F (37 C)] 98.2 F (36.8 C) (02/13 0538) Pulse Rate:  [70-101] 70 (02/13 0538) Resp:  [18] 18 (02/13 0538) BP: (102-133)/(59-67) 122/65 mmHg (02/13 0538) SpO2:  [94 %-100 %] 100 % (02/13 0538) Last BM Date: 01/22/16  Intake/Output from previous day: 02/12 0701 - 02/13 0700 In: 1593.8 [I.V.:1443.8; IV Piggyback:150] Out: 650 [Urine:650] Intake/Output this shift:    Awake and alert. Wound healing well and packing intact. Neck no swelling,. cv- rrr. l- CTA ext- no swelling or tenderness  Lab Results:  No results for input(s): WBC, HGB, HCT, PLT in the last 72 hours. BMET No results for input(s): NA, K, CL, CO2, GLUCOSE, BUN, CREATININE, CALCIUM in the last 72 hours. PT/INR No results for input(s): LABPROT, INR in the last 72 hours. ABG No results for input(s): PHART, HCO3 in the last 72 hours.  Invalid input(s): PCO2, PO2  Studies/Results: No results found.  Anti-infectives: Anti-infectives    Start     Dose/Rate Route Frequency Ordered Stop   01/22/16 1600  clindamycin (CLEOCIN) IVPB 300 mg     300 mg 100 mL/hr over 30 Minutes Intravenous 3 times per day 01/22/16 1456     01/22/16 0830  ceFAZolin (ANCEF) IVPB 2 g/50 mL premix  Status:  Discontinued     2 g 100 mL/hr over 30 Minutes Intravenous To ShortStay Surgical 01/21/16 1019 01/22/16 1539   01/22/16 0728  ceFAZolin (ANCEF) 2-3 GM-% IVPB SOLR    Comments:  Sammuel Cooper   : cabinet override      01/22/16 0728 01/22/16 0946   01/22/16 0000  clindamycin (CLEOCIN) 300 MG capsule     300 mg Oral 3 times daily 01/22/16 1308        Assessment/Plan: s/p Procedure(s): RIGHT SIDE PARTIAL MAXILLECTOMY (Right) She is doing reasonably well and is ready to go home. She is able to take fluids without difficulty now that  the feeding tube is removed. She will keep her calories up with an sure or instant breakfast and full liquid diet. She will follow-up this week with Dr. Constance Holster.  LOS: 3 days    Melissa Montane 01/25/2016

## 2016-01-25 NOTE — Progress Notes (Signed)
Patient discharted to home with instructions.

## 2016-02-01 NOTE — Discharge Summary (Signed)
Physician Discharge Summary  Patient ID: [name] [name] MRN: [id number] DOB/AGE: May 20, 1946 70 y.o.  Admit date: 01/22/2016 Discharge date: 02/01/2016  Admission Diagnoses:Maxillary adenocarcinoma  Discharge Diagnoses:  Active Problems:   Oral cancer [facility])   Discharged Condition: fair  Hospital Course: no complications, feeding tube removed prior to discharge.  Consults: none  Significant Diagnostic Studies: none  Treatments: surgery: partial maxillectomy  Discharge Exam: Blood pressure 122/65, pulse 70, temperature 98.2 F (36.8 C), temperature source Oral, resp. rate 18, height '5\' 2"'  (1.575 m), weight 68.4 kg (150 lb 12.7 oz), SpO2 100 %. PHYSICAL EXAM: Packing in place with suture closure of maxillary defect.   Disposition: 01-Home or Self Care  Discharge Instructions    Call MD for:  difficulty breathing, headache or visual disturbances    Complete by:  As directed      Call MD for:  extreme fatigue    Complete by:  As directed      Call MD for:  hives    Complete by:  As directed      Call MD for:  persistant dizziness or light-headedness    Complete by:  As directed      Call MD for:  persistant nausea and vomiting    Complete by:  As directed      Call MD for:  redness, tenderness, or signs of infection (pain, swelling, redness, odor or green/yellow discharge around incision site)    Complete by:  As directed      Call MD for:  severe uncontrolled pain    Complete by:  As directed      Call MD for:  temperature >100.4    Complete by:  As directed      Diet - low sodium heart healthy    Complete by:  As directed      Discharge instructions    Complete by:  As directed   Call the office at [phone] to get an appointment with Dr. Constance Holster this week. Call if there's any problems developing or complaints. Continue liquid diet with ensure or instant breakfast.     Increase activity slowly    Complete by:  As directed             Medication List    TAKE  these medications        acyclovir cream 5 %  Commonly known as:  ZOVIRAX  Apply 1 application topically daily as needed. For psoriasis     ADVAIR DISKUS 250-50 MCG/DOSE Aepb  Generic drug:  Fluticasone-Salmeterol  INHALE 1 DOSE BY MOUTH TWICE DAILY. RINSE MOUTH AFTER USE     albuterol 108 (90 Base) MCG/ACT inhaler  Commonly known as:  PROVENTIL HFA;VENTOLIN HFA  Inhale 2 puffs into the lungs every 6 (six) hours as needed. Please put in patient file until she is ready for refill thanks     amLODipine 5 MG tablet  Commonly known as:  NORVASC  TAKE 1 TABLET EVERY DAY     aspirin 325 MG EC tablet  Take 325 mg by mouth daily.     clindamycin 300 MG capsule  Commonly known as:  CLEOCIN  Take 1 capsule (300 mg total) by mouth 3 (three) times daily.     clobetasol ointment 0.05 %  Commonly known as:  TEMOVATE  Use twice once daily to hand and feet as needed     fluocinolone 0.025 % ointment  Commonly known as:  SYNALAR  Apply topically 2 (two) times daily  as needed.     glucose blood test strip  Commonly known as:  ONE TOUCH ULTRA TEST  1 each by Other route daily. Dx Code E11.40     hydrochlorothiazide 25 MG tablet  Commonly known as:  HYDRODIURIL  TAKE 1 TABLET EVERY DAY     HYDROcodone-acetaminophen 7.5-325 mg/15 ml solution  Commonly known as:  HYCET  Take 10-15 mLs by mouth every 4 (four) hours as needed for moderate pain.     ketoconazole 2 % cream  Commonly known as:  NIZORAL  Apply topically 3 (three) times daily. use as directed two times a day as needed under breast     loratadine-pseudoephedrine 10-240 MG 24 hr tablet  Commonly known as:  CLARITIN-D 24-hour  Take 1 tablet by mouth daily.     lovastatin 40 MG tablet  Commonly known as:  MEVACOR  TAKE 2 TABLETS EVERY DAY     metFORMIN 500 MG 24 hr tablet  Commonly known as:  GLUCOPHAGE-XR  TAKE 2 TABLETS EVERY DAY     methimazole 5 MG tablet  Commonly known as:  TAPAZOLE  Take 1 tablet (5 mg total) by  mouth 3 (three) times a week.     metoprolol tartrate 25 MG tablet  Commonly known as:  LOPRESSOR  TAKE 1 & 1/2 TABLETS TWICE A DAY     naproxen 500 MG tablet  Commonly known as:  NAPROSYN  TAKE 1 TABLET (500 MG TOTAL) BY MOUTH 2 (TWO) TIMES DAILY WITH A MEAL.     naproxen sodium 220 MG tablet  Commonly known as:  ANAPROX  Take 220 mg by mouth daily as needed (for pain).     omeprazole 20 MG capsule  Commonly known as:  PRILOSEC  TAKE ONE CAPSULE EVERY DAY     ondansetron 4 MG disintegrating tablet  Commonly known as:  ZOFRAN ODT  Take 1 tablet (4 mg total) by mouth every 8 (eight) hours as needed for nausea or vomiting.     ONE TOUCH ULTRA SYSTEM KIT w/Device Kit  1 kit by Does not apply route once. 151.76     ONETOUCH DELICA LANCETS 16W Misc  1 Device by Other route daily. Use to check blood sugars daily E11.9     potassium chloride 10 MEQ tablet  Commonly known as:  KLOR-CON 10  Take 1 tablet (10 mEq total) by mouth daily.     tiZANidine 4 MG tablet  Commonly known as:  ZANAFLEX  Take 1 tablet (4 mg total) by mouth every 6 (six) hours as needed for muscle spasms.     traMADol 50 MG tablet  Commonly known as:  ULTRAM  Take 1 tablet (50 mg total) by mouth every 6 (six) hours as needed. for pain           Follow-up Information    Follow up with Izora Gala, MD. Schedule an appointment as soon as possible for a visit on 01/27/2016.   Specialty:  Otolaryngology   Contact information:   [address] [place] [address] [phone]       Signed: Izora Gala 02/01/2016, 8:51 AM

## 2016-02-08 ENCOUNTER — Encounter (HOSPITAL_COMMUNITY): Payer: Self-pay | Admitting: Emergency Medicine

## 2016-02-08 ENCOUNTER — Emergency Department (HOSPITAL_COMMUNITY)
Admission: EM | Admit: 2016-02-08 | Discharge: 2016-02-08 | Disposition: A | Discharge status: Home / Self Care | Payer: Medicare Other | Attending: Emergency Medicine | Admitting: Emergency Medicine

## 2016-02-08 DIAGNOSIS — M1990 Unspecified osteoarthritis, unspecified site: Secondary | ICD-10-CM | POA: Insufficient documentation

## 2016-02-08 DIAGNOSIS — J449 Chronic obstructive pulmonary disease, unspecified: Secondary | ICD-10-CM | POA: Diagnosis not present

## 2016-02-08 DIAGNOSIS — I10 Essential (primary) hypertension: Secondary | ICD-10-CM | POA: Insufficient documentation

## 2016-02-08 DIAGNOSIS — L7621 Postprocedural hemorrhage and hematoma of skin and subcutaneous tissue following a dermatologic procedure: Secondary | ICD-10-CM | POA: Diagnosis not present

## 2016-02-08 DIAGNOSIS — Z7984 Long term (current) use of oral hypoglycemic drugs: Secondary | ICD-10-CM | POA: Diagnosis not present

## 2016-02-08 DIAGNOSIS — Z85818 Personal history of malignant neoplasm of other sites of lip, oral cavity, and pharynx: Secondary | ICD-10-CM | POA: Insufficient documentation

## 2016-02-08 DIAGNOSIS — E7800 Pure hypercholesterolemia, unspecified: Secondary | ICD-10-CM | POA: Diagnosis not present

## 2016-02-08 DIAGNOSIS — Z79899 Other long term (current) drug therapy: Secondary | ICD-10-CM | POA: Insufficient documentation

## 2016-02-08 DIAGNOSIS — K219 Gastro-esophageal reflux disease without esophagitis: Secondary | ICD-10-CM | POA: Insufficient documentation

## 2016-02-08 DIAGNOSIS — Z87891 Personal history of nicotine dependence: Secondary | ICD-10-CM | POA: Diagnosis not present

## 2016-02-08 DIAGNOSIS — E119 Type 2 diabetes mellitus without complications: Secondary | ICD-10-CM | POA: Insufficient documentation

## 2016-02-08 DIAGNOSIS — Z7982 Long term (current) use of aspirin: Secondary | ICD-10-CM | POA: Insufficient documentation

## 2016-02-08 DIAGNOSIS — Z85038 Personal history of other malignant neoplasm of large intestine: Secondary | ICD-10-CM | POA: Diagnosis not present

## 2016-02-08 DIAGNOSIS — E669 Obesity, unspecified: Secondary | ICD-10-CM | POA: Diagnosis not present

## 2016-02-08 DIAGNOSIS — L7622 Postprocedural hemorrhage and hematoma of skin and subcutaneous tissue following other procedure: Secondary | ICD-10-CM

## 2016-02-08 DIAGNOSIS — Z9889 Other specified postprocedural states: Secondary | ICD-10-CM | POA: Insufficient documentation

## 2016-02-08 LAB — PROTIME-INR
INR: 1.04 (ref 0.00–1.49)
Prothrombin Time: 13.8 seconds (ref 11.6–15.2)

## 2016-02-08 LAB — CBC WITH DIFFERENTIAL/PLATELET
BASOS PCT: 0 %
Basophils Absolute: 0 10*3/uL (ref 0.0–0.1)
EOS ABS: 0.2 10*3/uL (ref 0.0–0.7)
Eosinophils Relative: 2 %
HCT: 34.1 % — ABNORMAL LOW (ref 36.0–46.0)
HEMOGLOBIN: 10.9 g/dL — AB (ref 12.0–15.0)
Lymphocytes Relative: 21 %
Lymphs Abs: 1.7 10*3/uL (ref 0.7–4.0)
MCH: 25.1 pg — ABNORMAL LOW (ref 26.0–34.0)
MCHC: 32 g/dL (ref 30.0–36.0)
MCV: 78.4 fL (ref 78.0–100.0)
MONOS PCT: 7 %
Monocytes Absolute: 0.6 10*3/uL (ref 0.1–1.0)
NEUTROS PCT: 70 %
Neutro Abs: 5.9 10*3/uL (ref 1.7–7.7)
Platelets: 378 10*3/uL (ref 150–400)
RBC: 4.35 MIL/uL (ref 3.87–5.11)
RDW: 15.4 % (ref 11.5–15.5)
WBC: 8.4 10*3/uL (ref 4.0–10.5)

## 2016-02-08 LAB — BASIC METABOLIC PANEL
Anion gap: 11 (ref 5–15)
BUN: 8 mg/dL (ref 6–20)
CALCIUM: 9.2 mg/dL (ref 8.9–10.3)
CHLORIDE: 103 mmol/L (ref 101–111)
CO2: 27 mmol/L (ref 22–32)
CREATININE: 0.76 mg/dL (ref 0.44–1.00)
Glucose, Bld: 106 mg/dL — ABNORMAL HIGH (ref 65–99)
Potassium: 3.9 mmol/L (ref 3.5–5.1)
SODIUM: 141 mmol/L (ref 135–145)

## 2016-02-08 MED ORDER — SODIUM CHLORIDE 0.9 % IV SOLN
INTRAVENOUS | Status: DC
  Administered 2016-02-08: 07:00:00 via INTRAVENOUS

## 2016-02-08 NOTE — ED Provider Notes (Signed)
CSN: [id number]     Arrival date & time 02/08/16  5465 History   First MD Initiated Contact with Patient 02/08/16 [phone]     Chief Complaint  Patient presents with  . Post-op Problem    HPI Pt had surgery in her mouth on Feb 10th.  The patient had a right-sided partial maxillectomy for oropharyngeal cancer . She had been doing well until [facility] when she woke up and noticed clots of blood in her mouth.  She rinsed her mouth out and was fine the rest of the day.  THis morning when she woke up she noticed a large amount of blood in her mouth again this morning.  Patient denies any trouble with fevers or chills. She has no increasing pain. She has not had any trouble with vomiting or diarrhea.  Past Medical History  Diagnosis Date  . Psoriasis   . Eczema   . DIABETES MELLITUS 01/30/2008  . GOITER, MULTINODULAR 05/21/2010  . GLUCOSE INTOLERANCE 09/03/2007  . HYPERCHOLESTEROLEMIA 09/03/2007  . HYPERTENSION 09/03/2007  . CORONARY ARTERY DISEASE 09/03/2007  . ASTHMA 09/03/2007  . COPD 09/03/2007  . GERD 09/03/2007  . OBESITY 09/03/2007  . DEGENERATIVE JOINT DISEASE 01/30/2008  . ARTHRITIS 01/30/2008  . COLON CANCER 04/07/2008    pt states she had colon cancer 1987  . LVH (left ventricular hypertrophy)   . Osteopenia   . Impaired glucose tolerance 04/10/2011  . COPD (chronic obstructive pulmonary disease) ([place]) 04/22/2013  . Diabetic peripheral neuropathy ([place]) 04/22/2013  . Shortness of breath dyspnea     With exertion and allergies to pollen  . History of blood transfusion   . Cancer [facility]) 1987    palate cancer   Past Surgical History  Procedure Laterality Date  . Abdominal hysterectomy    . Cardiac catheterization    . Hernia repair    . Tubal ligation    . Hemicolectomy  12/1985    cancer  . Floor of mouth biopsy N/A 12/24/2015    Procedure: ORAL BIOPSY;  Surgeon: Izora Gala, MD;  Location: [place];  Service: ENT;  Laterality: N/A;  . Colon surgery    . Maxillectomy Right 01/22/2016   Procedure: RIGHT SIDE PARTIAL MAXILLECTOMY;  Surgeon: Izora Gala, MD;  [facility];  Service: ENT;  Laterality: Right;   Family History  Problem Relation Age of Onset  . Liver disease Father   . Thyroid disease Sister     overactive  . Thyroid disease Brother     overactive  . Colon cancer Other     aunt  . Cancer Other     Colon Cancer-Aunt  . Heart disease Mother   . Colon cancer Paternal Aunt    Social History  Substance Use Topics  . Smoking status: Former Smoker -- 1.00 packs/day for 15 years    Types: Cigarettes    Quit date: 12/12/1978  . Smokeless tobacco: Never Used  . Alcohol Use: No   OB History    No data available     Review of Systems  All other systems reviewed and are negative.     Allergies  Simvastatin  Home Medications   Prior to Admission medications   Medication Sig Start Date End Date Taking? Authorizing Provider  acyclovir cream (ZOVIRAX) 5 % Apply 1 application topically daily as needed. For psoriasis 04/15/11  Yes Biagio Borg, MD  ADVAIR DISKUS 250-50 MCG/DOSE AEPB INHALE 1 DOSE BY MOUTH TWICE DAILY. RINSE MOUTH AFTER USE 09/21/15  Yes  Rigoberto Noel, MD  albuterol (PROVENTIL HFA;VENTOLIN HFA) 108 (90 BASE) MCG/ACT inhaler Inhale 2 puffs into the lungs every 6 (six) hours as needed. Please put in patient file until she is ready for refill thanks Patient taking differently: Inhale 2 puffs into the lungs every 6 (six) hours as needed for wheezing or shortness of breath. Please put in patient file until she is ready for refill thanks 07/04/12  Yes Rigoberto Noel, MD  amLODipine (NORVASC) 5 MG tablet TAKE 1 TABLET EVERY DAY 12/16/15  Yes Biagio Borg, MD  aspirin 325 MG EC tablet Take 325 mg by mouth daily.     Yes Historical Provider, MD  clobetasol (TEMOVATE) 0.05 % ointment Use twice once daily to hand and feet as needed Patient taking differently: Apply 1 application topically 2 (two) times daily. Use twice once daily to hand and feet as needed  10/19/11  Yes Biagio Borg, MD  fluocinolone (SYNALAR) 0.025 % ointment Apply topically 2 (two) times daily as needed. Patient taking differently: Apply 1 application topically 2 (two) times daily as needed. For psoriasis 10/19/11  Yes Biagio Borg, MD  hydrochlorothiazide (HYDRODIURIL) 25 MG tablet TAKE 1 TABLET EVERY DAY 01/13/16  Yes Biagio Borg, MD  HYDROcodone-acetaminophen (HYCET) 7.5-325 mg/15 ml solution Take 10-15 mLs by mouth every 4 (four) hours as needed for moderate pain. 01/25/16  Yes Melissa Montane, MD  ketoconazole (NIZORAL) 2 % cream Apply topically 3 (three) times daily. use as directed two times a day as needed under breast 04/22/13  Yes Biagio Borg, MD  loratadine-pseudoephedrine (CLARITIN-D 24-HOUR) 10-240 MG per 24 hr tablet Take 1 tablet by mouth daily.   Yes Historical Provider, MD  lovastatin (MEVACOR) 40 MG tablet TAKE 2 TABLETS EVERY DAY 05/13/15  Yes Biagio Borg, MD  metFORMIN (GLUCOPHAGE-XR) 500 MG 24 hr tablet TAKE 2 TABLETS EVERY DAY 07/20/15  Yes Biagio Borg, MD  methimazole (TAPAZOLE) 5 MG tablet Take 1 tablet (5 mg total) by mouth 3 (three) times a week. Patient taking differently: Take 5 mg by mouth every Monday, Wednesday, and Friday.  11/24/15  Yes Renato Shin, MD  metoprolol tartrate (LOPRESSOR) 25 MG tablet TAKE 1 & 1/2 TABLETS TWICE A DAY 12/16/15  Yes Biagio Borg, MD  naproxen (NAPROSYN) 500 MG tablet TAKE 1 TABLET (500 MG TOTAL) BY MOUTH 2 (TWO) TIMES DAILY WITH A MEAL. Patient taking differently: TAKE 1 TABLET (500 MG TOTAL) BY MOUTH once a day WITH A MEAL. 11/16/15  Yes Biagio Borg, MD  omeprazole (PRILOSEC) 20 MG capsule TAKE ONE CAPSULE EVERY DAY 01/13/16  Yes Biagio Borg, MD  ondansetron (ZOFRAN ODT) 4 MG disintegrating tablet Take 1 tablet (4 mg total) by mouth every 8 (eight) hours as needed for nausea or vomiting. 01/22/16  Yes Izora Gala, MD  potassium chloride (KLOR-CON 10) 10 MEQ tablet Take 1 tablet (10 mEq total) by mouth daily. 04/26/15  Yes Biagio Borg,  MD  tiZANidine (ZANAFLEX) 4 MG tablet Take 1 tablet (4 mg total) by mouth every 6 (six) hours as needed for muscle spasms. Patient taking differently: Take 4 mg by mouth every 6 (six) hours as needed for muscle spasms. For muscle spasms 10/31/14  Yes Biagio Borg, MD  traMADol (ULTRAM) 50 MG tablet Take 1 tablet (50 mg total) by mouth every 6 (six) hours as needed. for pain Patient taking differently: Take 50 mg by mouth daily. for pain 11/17/15  Yes Jeneen Rinks  Quin Hoop, MD  Blood Glucose Monitoring Suppl (ONE TOUCH ULTRA SYSTEM KIT) W/DEVICE KIT 1 kit by Does not apply route once. 250.02 10/31/14   Biagio Borg, MD  clindamycin (CLEOCIN) 300 MG capsule Take 1 capsule (300 mg total) by mouth 3 (three) times daily. Patient not taking: Reported on 02/08/2016 01/22/16   Izora Gala, MD  glucose blood (ONE TOUCH ULTRA TEST) test strip 1 each by Other route daily. Dx Code E11.40 08/05/15   Biagio Borg, MD  [facility] DELICA LANCETS 43X MISC 1 Device by Other route daily. Use to check blood sugars daily E11.9 08/03/15   Biagio Borg, MD   BP 119/73 mmHg  Pulse 80  Temp(Src) 97.5 F (36.4 C) (Axillary)  Resp 18  Ht '5\' 2"'  (1.575 m)  Wt 58.968 kg  BMI 23.77 kg/m2  SpO2 98% Physical Exam  Constitutional: No distress.  HENT:  Head: Normocephalic and atraumatic.  Right Ear: External ear normal.  Left Ear: External ear normal.  Patient has difficulty opening her mouth more than a centimeter or 2. Limited view of the posterior oropharynx. Surgical defect in the right maxillary region posteriorly with clot noted in the posterior oropharynx on the right side, no active bright red blood  Eyes: Conjunctivae are normal. Right eye exhibits no discharge. Left eye exhibits no discharge. No scleral icterus.  Neck: Neck supple. No tracheal deviation present.  Cardiovascular: Normal rate, regular rhythm and intact distal pulses.   Pulmonary/Chest: Effort normal and breath sounds normal. No stridor. No respiratory distress.  She has no wheezes. She has no rales.  Abdominal: Soft. Bowel sounds are normal. She exhibits no distension. There is no tenderness. There is no rebound and no guarding.  Musculoskeletal: She exhibits no edema or tenderness.  Neurological: She is alert. She has normal strength. No cranial nerve deficit (no facial droop, extraocular movements intact, no slurred speech) or sensory deficit. She exhibits normal muscle tone. She displays no seizure activity. Coordination normal.  Skin: Skin is warm and dry. No rash noted.  Psychiatric: She has a normal mood and affect.  Nursing note and vitals reviewed.   ED Course  Procedures (including critical care time) Labs Review Labs Reviewed  CBC WITH DIFFERENTIAL/PLATELET - Abnormal; Notable for the following:    Hemoglobin 10.9 (*)    HCT 34.1 (*)    MCH 25.1 (*)    All other components within normal limits  BASIC METABOLIC PANEL - Abnormal; Notable for the following:    Glucose, Bld 106 (*)    All other components within normal limits  PROTIME-INR     MDM   Final diagnoses:  Postoperative hemorrhage of subcutaneous tissue following non-dermatologic procedure    The patient is hemodynamically stable. She does not appear to have any active bleeding at this time. Laboratory tests are reassuring.   I spoke with Dr. Constance Holster.  He performed her initial surgery.  He would like to see her in his office in approximately 40 minutes.  I discussed this with the patient and her family. They're comfortable following up in the office.   Dorie Rank, MD 02/08/16 [phone]

## 2016-02-08 NOTE — ED Notes (Signed)
Cancer area removed from the top roof of her mouth that was surgically removed. Bleeding started around yesterday morning with a lot of clots, pt rinse mouth with warm salt water. Around 5 am bleeding returned and was much worse than the other 2 times. Thick bloody clots and mucus; pt actively coughing up.

## 2016-02-15 ENCOUNTER — Other Ambulatory Visit: Payer: Self-pay | Admitting: Internal Medicine

## 2016-02-16 ENCOUNTER — Other Ambulatory Visit: Payer: Self-pay

## 2016-02-16 DIAGNOSIS — Z1231 Encounter for screening mammogram for malignant neoplasm of breast: Secondary | ICD-10-CM

## 2016-03-01 DIAGNOSIS — Z9889 Other specified postprocedural states: Secondary | ICD-10-CM | POA: Insufficient documentation

## 2016-03-28 ENCOUNTER — Ambulatory Visit (INDEPENDENT_AMBULATORY_CARE_PROVIDER_SITE_OTHER): Payer: Medicare Other | Admitting: Endocrinology

## 2016-03-28 ENCOUNTER — Ambulatory Visit
Admission: RE | Admit: 2016-03-28 | Discharge: 2016-03-28 | Disposition: A | Discharge status: Home / Self Care | Payer: Medicare Other | Source: Ambulatory Visit

## 2016-03-28 ENCOUNTER — Encounter: Payer: Self-pay | Admitting: Endocrinology

## 2016-03-28 VITALS — BP 122/70 | HR 87 | Temp 98.2°F | Ht 62.0 in | Wt 134.0 lb

## 2016-03-28 DIAGNOSIS — Z1231 Encounter for screening mammogram for malignant neoplasm of breast: Secondary | ICD-10-CM

## 2016-03-28 DIAGNOSIS — E042 Nontoxic multinodular goiter: Secondary | ICD-10-CM

## 2016-03-28 LAB — TSH: TSH: 0.39 u[IU]/mL (ref 0.35–4.50)

## 2016-03-28 NOTE — Patient Instructions (Signed)
blood tests are requested for you today.  We'll let you know about the results.   Please come back for a follow-up appointment in 4 months.   if ever you have fever while taking methimazole, stop it and call us, because of the risk of a rare side-effect.

## 2016-03-28 NOTE — Progress Notes (Signed)
Subjective:    Patient ID: [name] [name], female    DOB: Aug 13, 1946, 70 y.o.   MRN: [id number]  HPI Pt returns for f/u of a small multinodular goiter (dx'ed 2011; nodules have not met criteria for bx; f/u ultrasound in 2014 was unchanged; in 2016, TSH was more suppressed; she was offered I-131 rx, but chose tapazole). she feels better in general since her recent cancer surgery.   Past Medical History  Diagnosis Date  . Psoriasis   . Eczema   . DIABETES MELLITUS 01/30/2008  . GOITER, MULTINODULAR 05/21/2010  . GLUCOSE INTOLERANCE 09/03/2007  . HYPERCHOLESTEROLEMIA 09/03/2007  . HYPERTENSION 09/03/2007  . CORONARY ARTERY DISEASE 09/03/2007  . ASTHMA 09/03/2007  . COPD 09/03/2007  . GERD 09/03/2007  . OBESITY 09/03/2007  . DEGENERATIVE JOINT DISEASE 01/30/2008  . ARTHRITIS 01/30/2008  . COLON CANCER 04/07/2008    pt states she had colon cancer 1987  . LVH (left ventricular hypertrophy)   . Osteopenia   . Impaired glucose tolerance 04/10/2011  . COPD (chronic obstructive pulmonary disease) ([place]) 04/22/2013  . Diabetic peripheral neuropathy ([place]) 04/22/2013  . Shortness of breath dyspnea     With exertion and allergies to pollen  . History of blood transfusion   . Cancer [facility]) 1987    palate cancer    Past Surgical History  Procedure Laterality Date  . Abdominal hysterectomy    . Cardiac catheterization    . Hernia repair    . Tubal ligation    . Hemicolectomy  12/1985    cancer  . Floor of mouth biopsy N/A 12/24/2015    Procedure: ORAL BIOPSY;  Surgeon: Izora Gala, MD;  Location: [place];  Service: ENT;  Laterality: N/A;  . Colon surgery    . Maxillectomy Right 01/22/2016    Procedure: RIGHT SIDE PARTIAL MAXILLECTOMY;  Surgeon: Izora Gala, MD;  Location: [place];  Service: ENT;  Laterality: Right;    Social History   Social History  . Marital Status: Married    Spouse Name: N/A  . Number of Children: 4  . Years of Education: N/A   Occupational History  . stay at home     Social History Main Topics  . Smoking status: Former Smoker -- 1.00 packs/day for 15 years    Types: Cigarettes    Quit date: 12/12/1978  . Smokeless tobacco: Never Used  . Alcohol Use: No  . Drug Use: No  . Sexual Activity: Not on file   Other Topics Concern  . Not on file   Social History Narrative   1 child died with meningitis    Current Outpatient Prescriptions on File Prior to Visit  Medication Sig Dispense Refill  . acyclovir cream (ZOVIRAX) 5 % Apply 1 application topically daily as needed. For psoriasis    . ADVAIR DISKUS 250-50 MCG/DOSE AEPB INHALE 1 DOSE BY MOUTH TWICE DAILY. RINSE MOUTH AFTER USE 60 each 5  . albuterol (PROVENTIL HFA;VENTOLIN HFA) 108 (90 BASE) MCG/ACT inhaler Inhale 2 puffs into the lungs every 6 (six) hours as needed. Please put in patient file until she is ready for refill thanks (Patient taking differently: Inhale 2 puffs into the lungs every 6 (six) hours as needed for wheezing or shortness of breath. Please put in patient file until she is ready for refill thanks) 1 Inhaler 6  . amLODipine (NORVASC) 5 MG tablet TAKE 1 TABLET EVERY DAY 30 tablet 5  . aspirin 325 MG EC tablet Take 325 mg by mouth  daily.      . Blood Glucose Monitoring Suppl (ONE TOUCH ULTRA SYSTEM KIT) W/DEVICE KIT 1 kit by Does not apply route once. 250.02 1 each 0  . clobetasol (TEMOVATE) 0.05 % ointment Use twice once daily to hand and feet as needed (Patient taking differently: Apply 1 application topically 2 (two) times daily. Use twice once daily to hand and feet as needed) 30 g 1  . fluocinolone (SYNALAR) 0.025 % ointment Apply topically 2 (two) times daily as needed. (Patient taking differently: Apply 1 application topically 2 (two) times daily as needed. For psoriasis) 30 g 1  . glucose blood (ONE TOUCH ULTRA TEST) test strip 1 each by Other route daily. Dx Code E11.40 100 each 1  . hydrochlorothiazide (HYDRODIURIL) 25 MG tablet TAKE 1 TABLET EVERY DAY 90 tablet 2  .  ketoconazole (NIZORAL) 2 % cream Apply topically 3 (three) times daily. use as directed two times a day as needed under breast 15 g 3  . loratadine-pseudoephedrine (CLARITIN-D 24-HOUR) 10-240 MG per 24 hr tablet Take 1 tablet by mouth daily.    Marland Kitchen lovastatin (MEVACOR) 40 MG tablet TAKE 2 TABLETS EVERY DAY 60 tablet 9  . metFORMIN (GLUCOPHAGE-XR) 500 MG 24 hr tablet Take 2 tablets (1,000 mg total) by mouth daily. 180 tablet 3  . methimazole (TAPAZOLE) 5 MG tablet Take 1 tablet (5 mg total) by mouth 3 (three) times a week. (Patient taking differently: Take 5 mg by mouth every Monday, Wednesday, and Friday. ) 30 tablet 3  . metoprolol tartrate (LOPRESSOR) 25 MG tablet TAKE 1 & 1/2 TABLETS TWICE A DAY 90 tablet 5  . naproxen (NAPROSYN) 500 MG tablet TAKE 1 TABLET (500 MG TOTAL) BY MOUTH 2 (TWO) TIMES DAILY WITH A MEAL. (Patient taking differently: TAKE 1 TABLET (500 MG TOTAL) BY MOUTH once a day WITH A MEAL.) 60 tablet 3  . omeprazole (PRILOSEC) 20 MG capsule TAKE ONE CAPSULE EVERY DAY 90 capsule 1  . ONETOUCH DELICA LANCETS 69C MISC 1 Device by Other route daily. Use to check blood sugars daily E11.9 200 each 0  . potassium chloride (KLOR-CON 10) 10 MEQ tablet Take 1 tablet (10 mEq total) by mouth daily. 90 tablet 3  . tiZANidine (ZANAFLEX) 4 MG tablet Take 1 tablet (4 mg total) by mouth every 6 (six) hours as needed for muscle spasms. (Patient taking differently: Take 4 mg by mouth every 6 (six) hours as needed for muscle spasms. For muscle spasms) 60 tablet 1  . traMADol (ULTRAM) 50 MG tablet Take 1 tablet (50 mg total) by mouth every 6 (six) hours as needed. for pain (Patient taking differently: Take 50 mg by mouth daily. for pain) 120 tablet 3   No current facility-administered medications on file prior to visit.    Allergies  Allergen Reactions  . Simvastatin Other (See Comments)    Muscle spasms    Family History  Problem Relation Age of Onset  . Liver disease Father   . Thyroid disease  Sister     overactive  . Thyroid disease Brother     overactive  . Colon cancer Other     aunt  . Cancer Other     Colon Cancer-Aunt  . Heart disease Mother   . Colon cancer Paternal Aunt     BP 122/70 mmHg  Pulse 87  Temp(Src) 98.2 F (36.8 C) (Oral)  Ht '5\' 2"'  (1.575 m)  Wt 134 lb (60.782 kg)  BMI 24.50 kg/m2  SpO2 97%  Review of Systems No fever    Objective:   Physical Exam VITAL SIGNS:  See vs page GENERAL: no distress NECK: Thyroid is slightly enlarged, with irregular surface.     Lab Results  Component Value Date   TSH 0.39 03/28/2016      Assessment & Plan:  Hyperthyroidism: well-controlled.   Patient is advised the following: Patient Instructions  blood tests are requested for you today.  We'll let you know about the results.   Please come back for a follow-up appointment in 4 months.   if ever you have fever while taking methimazole, stop it and call us, because of the risk of a rare side-effect.

## 2016-05-05 ENCOUNTER — Other Ambulatory Visit (INDEPENDENT_AMBULATORY_CARE_PROVIDER_SITE_OTHER): Payer: Medicare Other

## 2016-05-05 DIAGNOSIS — E1140 Type 2 diabetes mellitus with diabetic neuropathy, unspecified: Secondary | ICD-10-CM | POA: Diagnosis not present

## 2016-05-05 LAB — BASIC METABOLIC PANEL
BUN: 11 mg/dL (ref 6–23)
CO2: 32 mEq/L (ref 19–32)
CREATININE: 0.73 mg/dL (ref 0.40–1.20)
Calcium: 9.3 mg/dL (ref 8.4–10.5)
Chloride: 104 mEq/L (ref 96–112)
GFR: 101.17 mL/min (ref >60.00)
Glucose, Bld: 90 mg/dL (ref 70–99)
Potassium: 3.6 mEq/L (ref 3.5–5.1)
Sodium: 142 mEq/L (ref 135–145)

## 2016-05-05 LAB — LIPID PANEL
CHOL/HDL RATIO: 3
Cholesterol: 143 mg/dL (ref 0–200)
HDL: 50.8 mg/dL (ref >39.00)
LDL CALC: 77 mg/dL (ref 0–99)
NONHDL: 91.96
TRIGLYCERIDES: 77 mg/dL (ref 0.0–149.0)
VLDL: 15.4 mg/dL (ref 0.0–40.0)

## 2016-05-05 LAB — HEPATIC FUNCTION PANEL
ALT: 10 U/L (ref 0–35)
AST: 16 U/L (ref 0–37)
Albumin: 4 g/dL (ref 3.5–5.2)
Alkaline Phosphatase: 73 U/L (ref 39–117)
BILIRUBIN TOTAL: 0.7 mg/dL (ref 0.2–1.2)
Bilirubin, Direct: 0.2 mg/dL (ref 0.0–0.3)
Total Protein: 6.6 g/dL (ref 6.0–8.3)

## 2016-05-05 LAB — HEMOGLOBIN A1C: Hgb A1c MFr Bld: 6.3 % (ref 4.6–6.5)

## 2016-05-09 ENCOUNTER — Other Ambulatory Visit: Payer: Self-pay | Admitting: Internal Medicine

## 2016-05-10 ENCOUNTER — Encounter: Payer: Self-pay | Admitting: Internal Medicine

## 2016-05-10 ENCOUNTER — Ambulatory Visit (INDEPENDENT_AMBULATORY_CARE_PROVIDER_SITE_OTHER): Payer: Medicare Other | Admitting: Internal Medicine

## 2016-05-10 VITALS — BP 118/76 | HR 66 | Temp 97.9°F | Resp 20 | Wt 128.0 lb

## 2016-05-10 DIAGNOSIS — E1140 Type 2 diabetes mellitus with diabetic neuropathy, unspecified: Secondary | ICD-10-CM | POA: Diagnosis not present

## 2016-05-10 DIAGNOSIS — Z0001 Encounter for general adult medical examination with abnormal findings: Secondary | ICD-10-CM | POA: Diagnosis not present

## 2016-05-10 DIAGNOSIS — E7800 Pure hypercholesterolemia, unspecified: Secondary | ICD-10-CM

## 2016-05-10 DIAGNOSIS — I10 Essential (primary) hypertension: Secondary | ICD-10-CM | POA: Diagnosis not present

## 2016-05-10 DIAGNOSIS — R6889 Other general symptoms and signs: Secondary | ICD-10-CM

## 2016-05-10 NOTE — Assessment & Plan Note (Signed)
stable overall by history and exam, recent data reviewed with pt, and pt to continue medical treatment as before,  to f/u any worsening symptoms or concerns Lab Results  Component Value Date   HGBA1C 6.3 05/05/2016

## 2016-05-10 NOTE — Patient Instructions (Signed)
Please continue all other medications as before, and refills have been done if requested.  Please have the pharmacy call with any other refills you may need.  Please continue your efforts at being more active, low cholesterol diet, and weight control.  You are otherwise up to date with prevention measures today.  Please keep your appointments with your specialists as you may have planned  Please return in 6 months, or sooner if needed, with Lab testing done 3-5 days before  

## 2016-05-10 NOTE — Assessment & Plan Note (Signed)
stable overall by history and exam, recent data reviewed with pt, and pt to continue medical treatment as before,  to f/u any worsening symptoms or concerns Lab Results  Component Value Date   LDLCALC 77 05/05/2016

## 2016-05-10 NOTE — Assessment & Plan Note (Signed)
stable overall by history and exam, recent data reviewed with pt, and pt to continue medical treatment as before,  to f/u any worsening symptoms or concerns BP Readings from Last 3 Encounters:  05/10/16 118/76  03/28/16 122/70  02/08/16 119/73

## 2016-05-10 NOTE — Progress Notes (Signed)
Pre visit review using our clinic review tool, if applicable. No additional management support is needed unless otherwise documented below in the visit note. 

## 2016-05-10 NOTE — Progress Notes (Signed)
Subjective:    Patient ID: [name] [name], female    DOB: 1946-09-14, 70 y.o.   MRN: [id number]  HPI Here to f/u; overall doing ok,  Pt denies chest pain, increasing sob or doe, wheezing, orthopnea, PND, increased LE swelling, palpitations, dizziness or syncope.  Pt denies new neurological symptoms such as new headache, or facial or extremity weakness or numbness.  Pt denies polydipsia, polyuria, or low sugar episode.   Pt denies new neurological symptoms such as new headache, or facial or extremity weakness or numbness.   Pt states overall good compliance with meds, mostly trying to follow appropriate diet, with wt overall stable, despite recent oral cancer surgury per Dr Constance Holster, impaired right TMJ function to where cannot open mouth more than a small amount, takes glucerna on a regular basis to help nutrition Wt Readings from Last 3 Encounters:  05/10/16 128 lb (58.06 kg)  03/28/16 134 lb (60.782 kg)  02/08/16 130 lb (58.968 kg)   Past Medical History  Diagnosis Date  . Psoriasis   . Eczema   . DIABETES MELLITUS 01/30/2008  . GOITER, MULTINODULAR 05/21/2010  . GLUCOSE INTOLERANCE 09/03/2007  . HYPERCHOLESTEROLEMIA 09/03/2007  . HYPERTENSION 09/03/2007  . CORONARY ARTERY DISEASE 09/03/2007  . ASTHMA 09/03/2007  . COPD 09/03/2007  . GERD 09/03/2007  . OBESITY 09/03/2007  . DEGENERATIVE JOINT DISEASE 01/30/2008  . ARTHRITIS 01/30/2008  . COLON CANCER 04/07/2008    pt states she had colon cancer 1987  . LVH (left ventricular hypertrophy)   . Osteopenia   . Impaired glucose tolerance 04/10/2011  . COPD (chronic obstructive pulmonary disease) ([place]) 04/22/2013  . Diabetic peripheral neuropathy ([place]) 04/22/2013  . Shortness of breath dyspnea     With exertion and allergies to pollen  . History of blood transfusion   . Cancer [facility]) 1987    palate cancer   Past Surgical History  Procedure Laterality Date  . Abdominal hysterectomy    . Cardiac catheterization    . Hernia repair    . Tubal  ligation    . Hemicolectomy  12/1985    cancer  . Floor of mouth biopsy N/A 12/24/2015    Procedure: ORAL BIOPSY;  Surgeon: Izora Gala, MD;  Location: [place];  Service: ENT;  Laterality: N/A;  . Colon surgery    . Maxillectomy Right 01/22/2016    Procedure: RIGHT SIDE PARTIAL MAXILLECTOMY;  Surgeon: Izora Gala, MD;  Location: [place];  Service: ENT;  Laterality: Right;    reports that she quit smoking about 37 years ago. Her smoking use included Cigarettes. She has a 15 pack-year smoking history. She has never used smokeless tobacco. She reports that she does not drink alcohol or use illicit drugs. family history includes Cancer in her other; Colon cancer in her other and paternal aunt; Heart disease in her mother; Liver disease in her father; Thyroid disease in her brother and sister. Allergies  Allergen Reactions  . Simvastatin Other (See Comments)    Muscle spasms   Current Outpatient Prescriptions on File Prior to Visit  Medication Sig Dispense Refill  . acyclovir cream (ZOVIRAX) 5 % Apply 1 application topically daily as needed. For psoriasis    . ADVAIR DISKUS 250-50 MCG/DOSE AEPB INHALE 1 DOSE BY MOUTH TWICE DAILY. RINSE MOUTH AFTER USE 60 each 5  . albuterol (PROVENTIL HFA;VENTOLIN HFA) 108 (90 BASE) MCG/ACT inhaler Inhale 2 puffs into the lungs every 6 (six) hours as needed. Please put in patient file until she is ready  for refill thanks (Patient taking differently: Inhale 2 puffs into the lungs every 6 (six) hours as needed for wheezing or shortness of breath. Please put in patient file until she is ready for refill thanks) 1 Inhaler 6  . amLODipine (NORVASC) 5 MG tablet TAKE 1 TABLET EVERY DAY 30 tablet 5  . aspirin 325 MG EC tablet Take 325 mg by mouth daily.      . Blood Glucose Monitoring Suppl (ONE TOUCH ULTRA SYSTEM KIT) W/DEVICE KIT 1 kit by Does not apply route once. 250.02 1 each 0  . clobetasol (TEMOVATE) 0.05 % ointment Use twice once daily to hand and feet as needed  (Patient taking differently: Apply 1 application topically 2 (two) times daily. Use twice once daily to hand and feet as needed) 30 g 1  . fluocinolone (SYNALAR) 0.025 % ointment Apply topically 2 (two) times daily as needed. (Patient taking differently: Apply 1 application topically 2 (two) times daily as needed. For psoriasis) 30 g 1  . glucose blood (ONE TOUCH ULTRA TEST) test strip 1 each by Other route daily. Dx Code E11.40 100 each 1  . hydrochlorothiazide (HYDRODIURIL) 25 MG tablet TAKE 1 TABLET EVERY DAY 90 tablet 2  . ketoconazole (NIZORAL) 2 % cream Apply topically 3 (three) times daily. use as directed two times a day as needed under breast 15 g 3  . loratadine-pseudoephedrine (CLARITIN-D 24-HOUR) 10-240 MG per 24 hr tablet Take 1 tablet by mouth daily.    Marland Kitchen lovastatin (MEVACOR) 40 MG tablet TAKE 2 TABLETS EVERY DAY 60 tablet 9  . metFORMIN (GLUCOPHAGE-XR) 500 MG 24 hr tablet Take 2 tablets (1,000 mg total) by mouth daily. 180 tablet 3  . methimazole (TAPAZOLE) 5 MG tablet Take 1 tablet (5 mg total) by mouth 3 (three) times a week. (Patient taking differently: Take 5 mg by mouth every Monday, Wednesday, and Friday. ) 30 tablet 3  . metoprolol tartrate (LOPRESSOR) 25 MG tablet TAKE 1 & 1/2 TABLETS TWICE A DAY 90 tablet 5  . naproxen (NAPROSYN) 500 MG tablet TAKE 1 TABLET (500 MG TOTAL) BY MOUTH 2 (TWO) TIMES DAILY WITH A MEAL. (Patient taking differently: TAKE 1 TABLET (500 MG TOTAL) BY MOUTH once a day WITH A MEAL.) 60 tablet 3  . omeprazole (PRILOSEC) 20 MG capsule TAKE ONE CAPSULE EVERY DAY 90 capsule 1  . ONETOUCH DELICA LANCETS 02I MISC 1 Device by Other route daily. Use to check blood sugars daily E11.9 200 each 0  . potassium chloride (KLOR-CON 10) 10 MEQ tablet Take 1 tablet (10 mEq total) by mouth daily. 90 tablet 3  . tiZANidine (ZANAFLEX) 4 MG tablet Take 1 tablet (4 mg total) by mouth every 6 (six) hours as needed for muscle spasms. (Patient taking differently: Take 4 mg by mouth  every 6 (six) hours as needed for muscle spasms. For muscle spasms) 60 tablet 1  . traMADol (ULTRAM) 50 MG tablet Take 1 tablet (50 mg total) by mouth every 6 (six) hours as needed. for pain (Patient taking differently: Take 50 mg by mouth daily. for pain) 120 tablet 3   No current facility-administered medications on file prior to visit.    Review of Systems  Constitutional: Negative for unusual diaphoresis or night sweats HENT: Negative for ear swelling or discharge Eyes: Negative for worsening visual haziness  Respiratory: Negative for choking and stridor.   Gastrointestinal: Negative for distension or worsening eructation Genitourinary: Negative for retention or change in urine volume.  Musculoskeletal: Negative for  other MSK pain or swelling Skin: Negative for color change and worsening wound Neurological: Negative for tremors and numbness other than noted  Psychiatric/Behavioral: Negative for decreased concentration or agitation other than above       Objective:   Physical Exam BP 118/76 mmHg  Pulse 66  Temp(Src) 97.9 F (36.6 C) (Oral)  Resp 20  Wt 128 lb (58.06 kg)  SpO2 98% VS noted,  Constitutional: Pt appears in no apparent distress HENT: Head: NCAT.  Right Ear: External ear normal.  Left Ear: External ear normal.  Eyes: . Pupils are equal, round, and reactive to light. Conjunctivae and EOM are normal Neck: Normal range of motion. Neck supple.  Cardiovascular: Normal rate and regular rhythm.   Pulmonary/Chest: Effort normal and breath sounds without rales or wheezing.  Abd:  Soft, NT, ND, + BS Neurological: Pt is alert. Not confused , motor grossly intact Skin: Skin is warm. No rash, no LE edema Psychiatric: Pt behavior is normal. No agitation.     Assessment & Plan:

## 2016-05-16 DIAGNOSIS — H9042 Sensorineural hearing loss, unilateral, left ear, with unrestricted hearing on the contralateral side: Secondary | ICD-10-CM | POA: Diagnosis not present

## 2016-05-16 DIAGNOSIS — R252 Cramp and spasm: Secondary | ICD-10-CM | POA: Diagnosis not present

## 2016-05-16 DIAGNOSIS — Z9889 Other specified postprocedural states: Secondary | ICD-10-CM | POA: Diagnosis not present

## 2016-05-16 DIAGNOSIS — H9011 Conductive hearing loss, unilateral, right ear, with unrestricted hearing on the contralateral side: Secondary | ICD-10-CM | POA: Diagnosis not present

## 2016-06-07 DIAGNOSIS — H6521 Chronic serous otitis media, right ear: Secondary | ICD-10-CM | POA: Diagnosis not present

## 2016-06-21 ENCOUNTER — Other Ambulatory Visit: Payer: Self-pay

## 2016-06-21 MED ORDER — METOPROLOL TARTRATE 25 MG PO TABS: ORAL_TABLET | ORAL | Status: DC

## 2016-06-21 MED ORDER — AMLODIPINE BESYLATE 5 MG PO TABS: 5.0000 mg | ORAL_TABLET | Freq: Every day | ORAL | Status: DC

## 2016-06-30 ENCOUNTER — Telehealth: Payer: Self-pay

## 2016-06-30 ENCOUNTER — Encounter: Payer: Self-pay | Admitting: Internal Medicine

## 2016-06-30 ENCOUNTER — Ambulatory Visit (INDEPENDENT_AMBULATORY_CARE_PROVIDER_SITE_OTHER): Payer: Medicare Other | Admitting: Internal Medicine

## 2016-06-30 ENCOUNTER — Ambulatory Visit (INDEPENDENT_AMBULATORY_CARE_PROVIDER_SITE_OTHER)
Admission: RE | Admit: 2016-06-30 | Discharge: 2016-06-30 | Disposition: A | Discharge status: Home / Self Care | Payer: Medicare Other | Source: Ambulatory Visit | Attending: Internal Medicine | Admitting: Internal Medicine

## 2016-06-30 VITALS — BP 132/82 | HR 106 | Temp 99.0°F | Resp 20 | Wt 120.0 lb

## 2016-06-30 DIAGNOSIS — E1140 Type 2 diabetes mellitus with diabetic neuropathy, unspecified: Secondary | ICD-10-CM | POA: Diagnosis not present

## 2016-06-30 DIAGNOSIS — R05 Cough: Secondary | ICD-10-CM

## 2016-06-30 DIAGNOSIS — I10 Essential (primary) hypertension: Secondary | ICD-10-CM

## 2016-06-30 DIAGNOSIS — R062 Wheezing: Secondary | ICD-10-CM

## 2016-06-30 DIAGNOSIS — R059 Cough, unspecified: Secondary | ICD-10-CM | POA: Insufficient documentation

## 2016-06-30 MED ORDER — CEFTRIAXONE SODIUM 1 G IJ SOLR
1.0000 g | Freq: Once | INTRAMUSCULAR | Status: AC
  Administered 2016-06-30: 1 g via INTRAMUSCULAR

## 2016-06-30 MED ORDER — PREDNISONE 10 MG PO TABS: ORAL_TABLET | ORAL | Status: DC

## 2016-06-30 MED ORDER — ALBUTEROL SULFATE HFA 108 (90 BASE) MCG/ACT IN AERS: 2.0000 | INHALATION_SPRAY | Freq: Four times a day (QID) | RESPIRATORY_TRACT | Status: DC | PRN

## 2016-06-30 MED ORDER — METHYLPREDNISOLONE ACETATE 40 MG/ML IJ SUSP
40.0000 mg | Freq: Once | INTRAMUSCULAR | Status: AC
  Administered 2016-06-30: 40 mg via INTRAMUSCULAR

## 2016-06-30 MED ORDER — HYDROCODONE-HOMATROPINE 5-1.5 MG/5ML PO SYRP
5.0000 mL | ORAL_SOLUTION | Freq: Four times a day (QID) | ORAL | Status: DC | PRN
Start: 2016-06-30 — End: 2017-01-20

## 2016-06-30 MED ORDER — LEVOFLOXACIN 500 MG PO TABS: 500.0000 mg | ORAL_TABLET | Freq: Every day | ORAL | Status: DC

## 2016-06-30 MED ORDER — TIZANIDINE HCL 4 MG PO TABS: 4.0000 mg | ORAL_TABLET | Freq: Four times a day (QID) | ORAL | Status: DC | PRN

## 2016-06-30 NOTE — Patient Instructions (Signed)
You had the antibiotic and steroid shots today  Please take all new medication as prescribed - the antibiotic, cough medicine and prednisone, as well as the inhaler if needed  Please continue all other medications as before, and refills have been done if requested.  Please have the pharmacy call with any other refills you may need.  Please keep your appointments with your specialists as you may have planned  You had the Chest xray today  You will be contacted by phone if any changes need to be made immediately.  Otherwise, you will receive a letter about your results with an explanation, but please check with MyChart first.  Please remember to sign up for MyChart if you have not done so, as this will be important to you in the future with finding out test results, communicating by private email, and scheduling acute appointments online when needed.

## 2016-06-30 NOTE — Telephone Encounter (Signed)
Medication refill sent .

## 2016-06-30 NOTE — Telephone Encounter (Signed)
Order placed

## 2016-06-30 NOTE — Progress Notes (Signed)
Pre visit review using our clinic review tool, if applicable. No additional management support is needed unless otherwise documented below in the visit note. 

## 2016-07-01 ENCOUNTER — Encounter: Payer: Self-pay | Admitting: Internal Medicine

## 2016-07-03 NOTE — Assessment & Plan Note (Signed)
stable overall by history and exam, recent data reviewed with pt, and pt to continue medical treatment as before,  to f/u any worsening symptoms or concerns Lab Results  Component Value Date   HGBA1C 6.3 05/05/2016   Pt to call for onset polys with illness and steroid tx

## 2016-07-03 NOTE — Progress Notes (Signed)
Subjective:    Patient ID: [name] [name], female    DOB: June 25, 1946, 70 y.o.   MRN: [id number]  HPI  Here with acute onset mild to mod 2-3 days ST, HA, general weakness and malaise, with prod cough greenish sputum, but Pt denies chest pain, increased sob or doe, wheezing, orthopnea, PND, increased LE swelling, palpitations, dizziness or syncope, except for mild wheezing onset last PM.  Temp has been as much as 101.8 last PM.  Tylenol and robitussin not helping.  Nothing else makes better or worse.   Past Medical History:  Diagnosis Date  . ARTHRITIS 01/30/2008  . ASTHMA 09/03/2007  . Cancer ([place]) 1987   palate cancer  . COLON CANCER 04/07/2008   pt states she had colon cancer 1987  . COPD 09/03/2007  . COPD (chronic obstructive pulmonary disease) ([place]) 04/22/2013  . CORONARY ARTERY DISEASE 09/03/2007  . DEGENERATIVE JOINT DISEASE 01/30/2008  . DIABETES MELLITUS 01/30/2008  . Diabetic peripheral neuropathy ([place]) 04/22/2013  . Eczema   . GERD 09/03/2007  . GLUCOSE INTOLERANCE 09/03/2007  . GOITER, MULTINODULAR 05/21/2010  . History of blood transfusion   . HYPERCHOLESTEROLEMIA 09/03/2007  . HYPERTENSION 09/03/2007  . Impaired glucose tolerance 04/10/2011  . LVH (left ventricular hypertrophy)   . OBESITY 09/03/2007  . Osteopenia   . Psoriasis   . Shortness of breath dyspnea    With exertion and allergies to pollen   Past Surgical History:  Procedure Laterality Date  . ABDOMINAL HYSTERECTOMY    . CARDIAC CATHETERIZATION    . COLON SURGERY    . FLOOR OF MOUTH BIOPSY N/A 12/24/2015   Procedure: ORAL BIOPSY;  Surgeon: Izora Gala, MD;  Location: [place];  Service: ENT;  Laterality: N/A;  . HEMICOLECTOMY  12/1985   cancer  . HERNIA REPAIR    . MAXILLECTOMY Right 01/22/2016   Procedure: RIGHT SIDE PARTIAL MAXILLECTOMY;  Surgeon: Izora Gala, MD;  Location: [place];  Service: ENT;  Laterality: Right;  . TUBAL LIGATION      reports that she quit smoking about 37 years ago. Her smoking use included  Cigarettes. She has a 15.00 pack-year smoking history. She has never used smokeless tobacco. She reports that she does not drink alcohol or use drugs. family history includes Cancer in her other; Colon cancer in her other and paternal aunt; Heart disease in her mother; Liver disease in her father; Thyroid disease in her brother and sister. Allergies  Allergen Reactions  . Simvastatin Other (See Comments)    Muscle spasms   Current Outpatient Prescriptions on File Prior to Visit  Medication Sig Dispense Refill  . acyclovir cream (ZOVIRAX) 5 % Apply 1 application topically daily as needed. For psoriasis    . ADVAIR DISKUS 250-50 MCG/DOSE AEPB INHALE 1 DOSE BY MOUTH TWICE DAILY. RINSE MOUTH AFTER USE 60 each 5  . amLODipine (NORVASC) 5 MG tablet Take 1 tablet (5 mg total) by mouth daily. 30 tablet 2  . aspirin 325 MG EC tablet Take 325 mg by mouth daily.      . Blood Glucose Monitoring Suppl (ONE TOUCH ULTRA SYSTEM KIT) W/DEVICE KIT 1 kit by Does not apply route once. 250.02 1 each 0  . clobetasol (TEMOVATE) 0.05 % ointment Use twice once daily to hand and feet as needed (Patient taking differently: Apply 1 application topically 2 (two) times daily. Use twice once daily to hand and feet as needed) 30 g 1  . fluocinolone (SYNALAR) 0.025 % ointment Apply topically 2 (  two) times daily as needed. (Patient taking differently: Apply 1 application topically 2 (two) times daily as needed. For psoriasis) 30 g 1  . glucose blood (ONE TOUCH ULTRA TEST) test strip 1 each by Other route daily. Dx Code E11.40 100 each 1  . hydrochlorothiazide (HYDRODIURIL) 25 MG tablet TAKE 1 TABLET EVERY DAY 90 tablet 2  . ketoconazole (NIZORAL) 2 % cream Apply topically 3 (three) times daily. use as directed two times a day as needed under breast 15 g 3  . loratadine-pseudoephedrine (CLARITIN-D 24-HOUR) 10-240 MG per 24 hr tablet Take 1 tablet by mouth daily.    Marland Kitchen lovastatin (MEVACOR) 40 MG tablet TAKE 2 TABLETS EVERY DAY 60  tablet 9  . metFORMIN (GLUCOPHAGE-XR) 500 MG 24 hr tablet Take 2 tablets (1,000 mg total) by mouth daily. 180 tablet 3  . methimazole (TAPAZOLE) 5 MG tablet Take 1 tablet (5 mg total) by mouth 3 (three) times a week. (Patient taking differently: Take 5 mg by mouth every Monday, Wednesday, and Friday. ) 30 tablet 3  . metoprolol tartrate (LOPRESSOR) 25 MG tablet TAKE 1 & 1/2 TABLETS TWICE A DAY 90 tablet 2  . naproxen (NAPROSYN) 500 MG tablet TAKE 1 TABLET (500 MG TOTAL) BY MOUTH 2 (TWO) TIMES DAILY WITH A MEAL. (Patient taking differently: TAKE 1 TABLET (500 MG TOTAL) BY MOUTH once a day WITH A MEAL.) 60 tablet 3  . omeprazole (PRILOSEC) 20 MG capsule TAKE ONE CAPSULE EVERY DAY 90 capsule 1  . ONETOUCH DELICA LANCETS 44B MISC 1 Device by Other route daily. Use to check blood sugars daily E11.9 200 each 0  . potassium chloride (KLOR-CON 10) 10 MEQ tablet Take 1 tablet (10 mEq total) by mouth daily. 90 tablet 3  . traMADol (ULTRAM) 50 MG tablet Take 1 tablet (50 mg total) by mouth every 6 (six) hours as needed. for pain (Patient taking differently: Take 50 mg by mouth daily. for pain) 120 tablet 3   No current facility-administered medications on file prior to visit.    Review of Systems  Constitutional: Negative for unusual diaphoresis or night sweats HENT: Negative for ear swelling or discharge Eyes: Negative for worsening visual haziness  Respiratory: Negative for choking and stridor.   Gastrointestinal: Negative for distension or worsening eructation Genitourinary: Negative for retention or change in urine volume.  Musculoskeletal: Negative for other MSK pain or swelling Skin: Negative for color change and worsening wound Neurological: Negative for tremors and numbness other than noted  Psychiatric/Behavioral: Negative for decreased concentration or agitation other than above       Objective:   Physical Exam BP 132/82   Pulse (!) 106   Temp 99 F (37.2 C) (Oral)   Resp 20   Wt 120  lb (54.4 kg)   SpO2 99%   BMI 21.95 kg/m  VS noted, mild ill Constitutional: Pt appears in no apparent distress HENT: Head: NCAT.  Right Ear: External ear normal.  Left Ear: External ear normal.  Bilat tm's with mild erythema.  Max sinus areas non tender.  Pharynx with mild erythema, no exudate Eyes: . Pupils are equal, round, and reactive to light. Conjunctivae and EOM are normal Neck: Normal range of motion. Neck supple.  Cardiovascular: Normal rate and regular rhythm.   Pulmonary/Chest: Effort normal and breath sounds with few RLL rales and few scatter bilat wheezing.  Neurological: Pt is alert. Not confused , motor grossly intact Skin: Skin is warm. No rash, no LE edema Psychiatric: Pt behavior  is normal. No agitation.     Assessment & Plan:

## 2016-07-03 NOTE — Assessment & Plan Note (Signed)
Mild to mod, for depomedrol IM, predpac asd, to f/u any worsening symptoms or concerns 

## 2016-07-03 NOTE — Assessment & Plan Note (Signed)
stable overall by history and exam, recent data reviewed with pt, and pt to continue medical treatment as before,  to f/u any worsening symptoms or concerns BP Readings from Last 3 Encounters:  06/30/16 132/82  05/10/16 118/76  03/28/16 122/70

## 2016-07-03 NOTE — Assessment & Plan Note (Signed)
Mild to mod, c/w bronchitis vs pna, for cxr, also for antibx course and cough med prn,  to f/u any worsening symptoms or concerns

## 2016-07-19 ENCOUNTER — Other Ambulatory Visit: Payer: Self-pay | Admitting: Internal Medicine

## 2016-07-27 DIAGNOSIS — R252 Cramp and spasm: Secondary | ICD-10-CM | POA: Diagnosis not present

## 2016-07-27 DIAGNOSIS — Z9889 Other specified postprocedural states: Secondary | ICD-10-CM | POA: Diagnosis not present

## 2016-07-28 ENCOUNTER — Ambulatory Visit (INDEPENDENT_AMBULATORY_CARE_PROVIDER_SITE_OTHER): Payer: Medicare Other | Admitting: Endocrinology

## 2016-07-28 ENCOUNTER — Encounter: Payer: Self-pay | Admitting: Endocrinology

## 2016-07-28 VITALS — BP 110/60 | HR 71 | Ht 62.0 in | Wt 121.5 lb

## 2016-07-28 DIAGNOSIS — E042 Nontoxic multinodular goiter: Secondary | ICD-10-CM

## 2016-07-28 LAB — TSH: TSH: 2.2 u[IU]/mL (ref 0.35–4.50)

## 2016-07-28 LAB — T4, FREE: FREE T4: 0.81 ng/dL (ref 0.60–1.60)

## 2016-07-28 NOTE — Patient Instructions (Addendum)
Thyroid blood tests are requested for you today.  We'll let you know about the results.   Please come back for a follow-up appointment in 6 months.   if ever you have fever while taking methimazole, stop it and call us, because of the risk of a rare side-effect.    

## 2016-07-28 NOTE — Progress Notes (Signed)
Subjective:    Patient ID: [name] [name], female    DOB: [birth year], 70 y.o.   MRN: [id number]  HPI Pt returns for f/u of a small multinodular goiter (dx'ed 2011; nodules have not met criteria for bx; f/u ultrasound in 2014 was unchanged; in 2016, TSH was more suppressed; she was offered I-131 rx, but chose tapazole). since on the tapazole, pt states she feels better in general.  She is finished cancer rx, for now.   Past Medical History:  Diagnosis Date  . ARTHRITIS 01/30/2008  . ASTHMA 09/03/2007  . Cancer ([place]) 1987   palate cancer  . COLON CANCER 04/07/2008   pt states she had colon cancer 1987  . COPD 09/03/2007  . COPD (chronic obstructive pulmonary disease) ([place]) 04/22/2013  . CORONARY ARTERY DISEASE 09/03/2007  . DEGENERATIVE JOINT DISEASE 01/30/2008  . DIABETES MELLITUS 01/30/2008  . Diabetic peripheral neuropathy ([place]) 04/22/2013  . Eczema   . GERD 09/03/2007  . GLUCOSE INTOLERANCE 09/03/2007  . GOITER, MULTINODULAR 05/21/2010  . History of blood transfusion   . HYPERCHOLESTEROLEMIA 09/03/2007  . HYPERTENSION 09/03/2007  . Impaired glucose tolerance 04/10/2011  . LVH (left ventricular hypertrophy)   . OBESITY 09/03/2007  . Osteopenia   . Psoriasis   . Shortness of breath dyspnea    With exertion and allergies to pollen    Past Surgical History:  Procedure Laterality Date  . ABDOMINAL HYSTERECTOMY    . CARDIAC CATHETERIZATION    . COLON SURGERY    . FLOOR OF MOUTH BIOPSY N/A 12/24/2015   Procedure: ORAL BIOPSY;  Surgeon: Izora Gala, MD;  Location: [place];  Service: ENT;  Laterality: N/A;  . HEMICOLECTOMY  12/1985   cancer  . HERNIA REPAIR    . MAXILLECTOMY Right 01/22/2016   Procedure: RIGHT SIDE PARTIAL MAXILLECTOMY;  Surgeon: Izora Gala, MD;  Location: [place];  Service: ENT;  Laterality: Right;  . TUBAL LIGATION      Social History   Social History  . Marital status: Married    Spouse name: N/A  . Number of children: 4  . Years of education: N/A   Occupational  History  . stay at home Unemployed   Social History Main Topics  . Smoking status: Former Smoker    Packs/day: 1.00    Years: 15.00    Types: Cigarettes    Quit date: 12/12/1978  . Smokeless tobacco: Never Used  . Alcohol use No  . Drug use: No  . Sexual activity: Not on file   Other Topics Concern  . Not on file   Social History Narrative   1 child died with meningitis    Current Outpatient Prescriptions on File Prior to Visit  Medication Sig Dispense Refill  . acyclovir cream (ZOVIRAX) 5 % Apply 1 application topically daily as needed. For psoriasis    . ADVAIR DISKUS 250-50 MCG/DOSE AEPB INHALE 1 DOSE BY MOUTH TWICE DAILY. RINSE MOUTH AFTER USE 60 each 5  . albuterol (PROVENTIL HFA;VENTOLIN HFA) 108 (90 Base) MCG/ACT inhaler Inhale 2 puffs into the lungs every 6 (six) hours as needed. 1 Inhaler 6  . amLODipine (NORVASC) 5 MG tablet Take 1 tablet (5 mg total) by mouth daily. 30 tablet 2  . aspirin 325 MG EC tablet Take 325 mg by mouth daily.      . Blood Glucose Monitoring Suppl (ONE TOUCH ULTRA SYSTEM KIT) W/DEVICE KIT 1 kit by Does not apply route once. 250.02 1 each 0  . clobetasol (TEMOVATE)  0.05 % ointment Use twice once daily to hand and feet as needed (Patient taking differently: Apply 1 application topically 2 (two) times daily. Use twice once daily to hand and feet as needed) 30 g 1  . fluocinolone (SYNALAR) 0.025 % ointment Apply topically 2 (two) times daily as needed. (Patient taking differently: Apply 1 application topically 2 (two) times daily as needed. For psoriasis) 30 g 1  . glucose blood (ONE TOUCH ULTRA TEST) test strip 1 each by Other route daily. Dx Code E11.40 100 each 1  . hydrochlorothiazide (HYDRODIURIL) 25 MG tablet TAKE 1 TABLET EVERY DAY 90 tablet 2  . ketoconazole (NIZORAL) 2 % cream Apply topically 3 (three) times daily. use as directed two times a day as needed under breast 15 g 3  . loratadine-pseudoephedrine (CLARITIN-D 24-HOUR) 10-240 MG per 24 hr  tablet Take 1 tablet by mouth daily.    Marland Kitchen lovastatin (MEVACOR) 40 MG tablet TAKE 2 TABLETS EVERY DAY 60 tablet 9  . metFORMIN (GLUCOPHAGE-XR) 500 MG 24 hr tablet Take 2 tablets (1,000 mg total) by mouth daily. 180 tablet 3  . methimazole (TAPAZOLE) 5 MG tablet Take 1 tablet (5 mg total) by mouth 3 (three) times a week. (Patient taking differently: Take 5 mg by mouth every Monday, Wednesday, and Friday. ) 30 tablet 3  . metoprolol tartrate (LOPRESSOR) 25 MG tablet TAKE 1 & 1/2 TABLETS TWICE A DAY 90 tablet 2  . naproxen (NAPROSYN) 500 MG tablet TAKE 1 TABLET (500 MG TOTAL) BY MOUTH 2 (TWO) TIMES DAILY WITH A MEAL. (Patient taking differently: TAKE 1 TABLET (500 MG TOTAL) BY MOUTH once a day WITH A MEAL.) 60 tablet 3  . omeprazole (PRILOSEC) 20 MG capsule TAKE 1 CAPSULE EVERY DAY 90 capsule 1  . ONETOUCH DELICA LANCETS 25E MISC 1 Device by Other route daily. Use to check blood sugars daily E11.9 200 each 0  . potassium chloride (KLOR-CON 10) 10 MEQ tablet Take 1 tablet (10 mEq total) by mouth daily. 90 tablet 3  . tiZANidine (ZANAFLEX) 4 MG tablet Take 1 tablet (4 mg total) by mouth every 6 (six) hours as needed for muscle spasms. 60 tablet 1  . traMADol (ULTRAM) 50 MG tablet Take 1 tablet (50 mg total) by mouth every 6 (six) hours as needed. for pain (Patient taking differently: Take 50 mg by mouth daily. for pain) 120 tablet 3  . HYDROcodone-homatropine (HYCODAN) 5-1.5 MG/5ML syrup Take 5 mLs by mouth every 6 (six) hours as needed for cough. (Patient not taking: Reported on 07/28/2016) 180 mL 0  . levofloxacin (LEVAQUIN) 500 MG tablet Take 1 tablet (500 mg total) by mouth daily. (Patient not taking: Reported on 07/28/2016) 10 tablet 0  . predniSONE (DELTASONE) 10 MG tablet 3 tabs by mouth per day for 3 days,2tabs per day for 3 days,1tab per day for 3 days (Patient not taking: Reported on 07/28/2016) 18 tablet 0   No current facility-administered medications on file prior to visit.     Allergies    Allergen Reactions  . Simvastatin Other (See Comments)    Muscle spasms    Family History  Problem Relation Age of Onset  . Liver disease Father   . Thyroid disease Sister     overactive  . Thyroid disease Brother     overactive  . Colon cancer Other     aunt  . Cancer Other     Colon Cancer-Aunt  . Heart disease Mother   . Colon cancer Paternal  Aunt     BP 110/60   Pulse 71   Ht '5\' 2"'  (1.575 m)   Wt 121 lb 8 oz (55.1 kg)   SpO2 95%   BMI 22.22 kg/m   Review of Systems Denies fever.      Objective:   Physical Exam VITAL SIGNS:  See vs page. GENERAL: no distress.   NECK: There is no palpable thyroid enlargement.  No thyroid nodule is palpable.  No palpable lymphadenopathy at the anterior neck.    Lab Results  Component Value Date   TSH 2.20 07/28/2016      Assessment & Plan:  Hyperthyroidism: well-controlled: Please continue the same medication.

## 2016-07-29 ENCOUNTER — Telehealth: Payer: Self-pay

## 2016-07-29 NOTE — Telephone Encounter (Signed)
Called patient and gave lab results. Patient had no questions or concerns.  

## 2016-08-08 ENCOUNTER — Other Ambulatory Visit: Payer: Self-pay | Admitting: Internal Medicine

## 2016-08-09 NOTE — Telephone Encounter (Signed)
Done hardcopy to Corinne  

## 2016-08-09 NOTE — Telephone Encounter (Signed)
Script faxed back to [facility].../lmb

## 2016-08-30 ENCOUNTER — Ambulatory Visit (INDEPENDENT_AMBULATORY_CARE_PROVIDER_SITE_OTHER): Payer: Medicare Other

## 2016-08-30 DIAGNOSIS — Z23 Encounter for immunization: Secondary | ICD-10-CM

## 2016-09-05 ENCOUNTER — Other Ambulatory Visit: Payer: Self-pay | Admitting: Endocrinology

## 2016-09-22 ENCOUNTER — Other Ambulatory Visit: Payer: Self-pay | Admitting: Internal Medicine

## 2016-10-18 ENCOUNTER — Other Ambulatory Visit: Payer: Self-pay | Admitting: Internal Medicine

## 2016-10-27 DIAGNOSIS — Z9889 Other specified postprocedural states: Secondary | ICD-10-CM | POA: Diagnosis not present

## 2016-11-04 ENCOUNTER — Other Ambulatory Visit (INDEPENDENT_AMBULATORY_CARE_PROVIDER_SITE_OTHER): Payer: Medicare Other

## 2016-11-04 DIAGNOSIS — Z0001 Encounter for general adult medical examination with abnormal findings: Secondary | ICD-10-CM

## 2016-11-04 DIAGNOSIS — E1140 Type 2 diabetes mellitus with diabetic neuropathy, unspecified: Secondary | ICD-10-CM | POA: Diagnosis not present

## 2016-11-04 LAB — HEPATIC FUNCTION PANEL
ALBUMIN: 3.9 g/dL (ref 3.5–5.2)
ALT: 8 U/L (ref 0–35)
AST: 13 U/L (ref 0–37)
Alkaline Phosphatase: 62 U/L (ref 39–117)
Bilirubin, Direct: 0.1 mg/dL (ref 0.0–0.3)
TOTAL PROTEIN: 6.6 g/dL (ref 6.0–8.3)
Total Bilirubin: 0.6 mg/dL (ref 0.2–1.2)

## 2016-11-04 LAB — BASIC METABOLIC PANEL
BUN: 14 mg/dL (ref 6–23)
CALCIUM: 9.2 mg/dL (ref 8.4–10.5)
CO2: 30 meq/L (ref 19–32)
CREATININE: 0.79 mg/dL (ref 0.40–1.20)
Chloride: 106 mEq/L (ref 96–112)
GFR: 92.22 mL/min (ref >60.00)
GLUCOSE: 84 mg/dL (ref 70–99)
Potassium: 3.9 mEq/L (ref 3.5–5.1)
Sodium: 144 mEq/L (ref 135–145)

## 2016-11-04 LAB — TSH: TSH: 0.57 u[IU]/mL (ref 0.35–4.50)

## 2016-11-04 LAB — CBC WITH DIFFERENTIAL/PLATELET
BASOS ABS: 0 10*3/uL (ref 0.0–0.1)
Basophils Relative: 0.3 % (ref 0.0–3.0)
EOS PCT: 3.9 % (ref 0.0–5.0)
Eosinophils Absolute: 0.3 10*3/uL (ref 0.0–0.7)
HCT: 35.6 % — ABNORMAL LOW (ref 36.0–46.0)
HEMOGLOBIN: 11.6 g/dL — AB (ref 12.0–15.0)
LYMPHS ABS: 1.4 10*3/uL (ref 0.7–4.0)
Lymphocytes Relative: 18.9 % (ref 12.0–46.0)
MCHC: 32.4 g/dL (ref 30.0–36.0)
MCV: 78.8 fl (ref 78.0–100.0)
MONO ABS: 0.6 10*3/uL (ref 0.1–1.0)
MONOS PCT: 8.2 % (ref 3.0–12.0)
NEUTROS PCT: 68.7 % (ref 43.0–77.0)
Neutro Abs: 5.3 10*3/uL (ref 1.4–7.7)
Platelets: 203 10*3/uL (ref 150.0–400.0)
RBC: 4.52 Mil/uL (ref 3.87–5.11)
RDW: 16.8 % — ABNORMAL HIGH (ref 11.5–15.5)
WBC: 7.7 10*3/uL (ref 4.0–10.5)

## 2016-11-04 LAB — LIPID PANEL
CHOLESTEROL: 148 mg/dL (ref 0–200)
HDL: 64.6 mg/dL (ref >39.00)
LDL CALC: 71 mg/dL (ref 0–99)
NONHDL: 83.68
Total CHOL/HDL Ratio: 2
Triglycerides: 64 mg/dL (ref 0.0–149.0)
VLDL: 12.8 mg/dL (ref 0.0–40.0)

## 2016-11-04 LAB — URINALYSIS, ROUTINE W REFLEX MICROSCOPIC
BILIRUBIN URINE: NEGATIVE
KETONES UR: NEGATIVE
LEUKOCYTES UA: NEGATIVE
NITRITE: NEGATIVE
PH: 5.5 (ref 5.0–8.0)
SPECIFIC GRAVITY, URINE: 1.02 (ref 1.000–1.030)
Total Protein, Urine: NEGATIVE
URINE GLUCOSE: NEGATIVE
UROBILINOGEN UA: 0.2 (ref 0.0–1.0)

## 2016-11-04 LAB — HEMOGLOBIN A1C: HEMOGLOBIN A1C: 6.2 % (ref 4.6–6.5)

## 2016-11-04 LAB — MICROALBUMIN / CREATININE URINE RATIO
Creatinine,U: 129.1 mg/dL
Microalb Creat Ratio: 0.7 mg/g (ref 0.0–30.0)
Microalb, Ur: 0.9 mg/dL (ref 0.0–1.9)

## 2016-11-09 NOTE — Progress Notes (Signed)
Pre visit review using our clinic review tool, if applicable. No additional management support is needed unless otherwise documented below in the visit note. 

## 2016-11-09 NOTE — Progress Notes (Signed)
Subjective:   [name] [name] is a 70 y.o. female who presents for an Initial Medicare Annual Wellness Visit.  The Patient was informed that the wellness visit is to identify future health risk and educate and initiate measures that can reduce risk for increased disease through the lifespan.   Describes health as fair, good or great? 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 Enjoys yard work and church activities.   Review of Systems    No ROS.  Medicare Wellness Visit.  Cardiac Risk Factors include: advanced age (>37mn, >>72women);diabetes mellitus;dyslipidemia;family history of premature cardiovascular disease   Sleep patterns: sleeps 7 hours. Up to void x 2.  Home Safety/Smoke Alarms:  Smoke detectors and security in place.  Living environment; residence and Firearm Safety: Lives with husband in single story home. No firearms. Seat Belt Safety/Bike Helmet: Wears seatbelt.   Counseling:   Eye Exam-Last exam > 7 years. Will make appointment.   Dental-Last exam 11/09/16, JVernard Gambles(prosthodontics). Next appt in Jan 2018.  Female:   Pap-01/12/2001      Mammo-03/28/16, negative.        Dexa scan-Last 2015, order placed.       [facility]-colonoscopy 03/19/13, normal. Recall 5 years. H/O colon Ca.       Objective:    Today's Vitals   11/10/16 0834  BP: 130/70  Pulse: 65  SpO2: 98%  Weight: 122 lb 1.9 oz (55.4 kg)  Height: _0  (1.575 m)   Body mass index is 22.34 kg/m.   Current Medications (verified) Outpatient Encounter Prescriptions as of 11/10/2016  Medication Sig  . acyclovir cream (ZOVIRAX) 5 % Apply 1 application topically daily as needed. For psoriasis  . albuterol (PROVENTIL HFA;VENTOLIN HFA) 108 (90 Base) MCG/ACT inhaler Inhale 2 puffs into the lungs every 6 (six) hours as needed.  .Marland KitchenamLODipine (NORVASC) 5 MG tablet TAKE 1 TABLET (5 MG TOTAL) BY MOUTH DAILY.  .Marland Kitchenaspirin 325 MG EC tablet Take 325 mg by mouth daily.    . Blood Glucose Monitoring  Suppl (ONE TOUCH ULTRA SYSTEM KIT) W/DEVICE KIT 1 kit by Does not apply route once. 250.02  . clobetasol (TEMOVATE) 0.05 % ointment Use twice once daily to hand and feet as needed (Patient taking differently: Apply 1 application topically 2 (two) times daily. Use twice once daily to hand and feet as needed)  . fluocinolone (SYNALAR) 0.025 % ointment Apply topically 2 (two) times daily as needed. (Patient taking differently: Apply 1 application topically 2 (two) times daily as needed. For psoriasis)  . glucose blood (ONE TOUCH ULTRA TEST) test strip 1 each by Other route daily. Dx Code E11.40  . hydrochlorothiazide (HYDRODIURIL) 25 MG tablet TAKE 1 TABLET EVERY DAY  . ketoconazole (NIZORAL) 2 % cream Apply topically 3 (three) times daily. use as directed two times a day as needed under breast  . loratadine-pseudoephedrine (CLARITIN-D 24-HOUR) 10-240 MG per 24 hr tablet Take 1 tablet by mouth daily.  .Marland Kitchenlovastatin (MEVACOR) 40 MG tablet TAKE 2 TABLETS EVERY DAY  . metFORMIN (GLUCOPHAGE-XR) 500 MG 24 hr tablet Take 2 tablets (1,000 mg total) by mouth daily.  . methimazole (TAPAZOLE) 5 MG tablet TAKE 1 TABLET BY MOUTH 3 TIMES A WEEK.  . metoprolol tartrate (LOPRESSOR) 25 MG tablet TAKE 1 & 1/2 TABLETS TWICE A DAY  . naproxen (NAPROSYN) 500 MG tablet TAKE 1 TABLET (500 MG TOTAL) BY MOUTH 2 (TWO) TIMES DAILY WITH A MEAL. (Patient taking differently: TAKE 1 TABLET (500 MG  TOTAL) BY MOUTH once a day WITH A MEAL.)  . omeprazole (PRILOSEC) 20 MG capsule TAKE 1 CAPSULE EVERY DAY  . ONETOUCH DELICA LANCETS 38G MISC 1 Device by Other route daily. Use to check blood sugars daily E11.9  . potassium chloride (KLOR-CON 10) 10 MEQ tablet Take 1 tablet (10 mEq total) by mouth daily.  Marland Kitchen tiZANidine (ZANAFLEX) 4 MG tablet Take 1 tablet (4 mg total) by mouth every 6 (six) hours as needed for muscle spasms.  . traMADol (ULTRAM) 50 MG tablet TAKE 1 TABLET EVERY 6 HOURS AS NEEDED FOR PAIN  . ADVAIR DISKUS 250-50 MCG/DOSE  AEPB INHALE 1 DOSE BY MOUTH TWICE DAILY. RINSE MOUTH AFTER USE  . HYDROcodone-homatropine (HYCODAN) 5-1.5 MG/5ML syrup Take 5 mLs by mouth every 6 (six) hours as needed for cough. (Patient not taking: Reported on 11/10/2016)  . levofloxacin (LEVAQUIN) 500 MG tablet Take 1 tablet (500 mg total) by mouth daily. (Patient not taking: Reported on 11/10/2016)  . predniSONE (DELTASONE) 10 MG tablet 3 tabs by mouth per day for 3 days,2tabs per day for 3 days,1tab per day for 3 days (Patient not taking: Reported on 11/10/2016)   No facility-administered encounter medications on file as of 11/10/2016.     Allergies (verified) Simvastatin   History: Past Medical History:  Diagnosis Date  . ARTHRITIS 01/30/2008  . ASTHMA 09/03/2007  . Cancer ([place]) 1987   palate cancer  . COLON CANCER 04/07/2008   pt states she had colon cancer 1987  . COPD 09/03/2007  . COPD (chronic obstructive pulmonary disease) ([place]) 04/22/2013  . CORONARY ARTERY DISEASE 09/03/2007  . DEGENERATIVE JOINT DISEASE 01/30/2008  . DIABETES MELLITUS 01/30/2008  . Diabetic peripheral neuropathy ([place]) 04/22/2013  . Eczema   . GERD 09/03/2007  . GLUCOSE INTOLERANCE 09/03/2007  . GOITER, MULTINODULAR 05/21/2010  . History of blood transfusion   . HYPERCHOLESTEROLEMIA 09/03/2007  . HYPERTENSION 09/03/2007  . Impaired glucose tolerance 04/10/2011  . LVH (left ventricular hypertrophy)   . OBESITY 09/03/2007  . Osteopenia   . Psoriasis   . Shortness of breath dyspnea    With exertion and allergies to pollen   Past Surgical History:  Procedure Laterality Date  . ABDOMINAL HYSTERECTOMY    . CARDIAC CATHETERIZATION    . COLON SURGERY    . FLOOR OF MOUTH BIOPSY N/A 12/24/2015   Procedure: ORAL BIOPSY;  Surgeon: Izora Gala, MD;  Location: [name];  Service: ENT;  Laterality: N/A;  . HEMICOLECTOMY  12/1985   cancer  . HERNIA REPAIR    . MAXILLECTOMY Right 01/22/2016   Procedure: RIGHT SIDE PARTIAL MAXILLECTOMY;  Surgeon: Izora Gala, MD;   Location: [place];  Service: ENT;  Laterality: Right;  . TUBAL LIGATION     Family History  Problem Relation Age of Onset  . Liver disease Father   . Heart disease Mother   . Colon cancer Paternal Aunt   . Thyroid disease Sister     overactive  . Cancer Sister     lung, adrenal gland  . Thyroid disease Brother     overactive  . Colon cancer Other     aunt  . Cancer Other     Colon Cancer-Aunt   Social History   Occupational History  . stay at home Unemployed   Social History Main Topics  . Smoking status: Former Smoker    Packs/day: 1.00    Years: 15.00    Types: Cigarettes    Quit date: 12/12/1978  . Smokeless  tobacco: Never Used  . Alcohol use No  . Drug use: No  . Sexual activity: Not on file    Tobacco Counseling Counseling given: Not Answered   Activities of Daily Living In your present state of health, do you have any difficulty performing the following activities: 11/10/2016 01/22/2016  Hearing? N -  Vision? N -  Difficulty concentrating or making decisions? N -  Walking or climbing stairs? N -  Dressing or bathing? N -  Doing errands, shopping? N N  Preparing Food and eating ? N -  Using the Toilet? N -  In the past six months, have you accidently leaked urine? N -  Do you have problems with loss of bowel control? N -  Managing your Medications? N -  Managing your Finances? N -  Housekeeping or managing your Housekeeping? N -  Some recent data might be hidden    Immunizations and Health Maintenance Immunization History  Administered Date(s) Administered  . H1N1 10/13/2008  . Influenza Split 07/31/2012  . Influenza Whole 09/12/2007, 09/12/2008, 08/20/2009, 08/26/2011  . Influenza, High Dose Seasonal PF 08/26/2014, 08/30/2016  . Influenza, Seasonal, Injecte, Preservative Fre 08/26/2013  . Influenza-Unspecified 08/27/2015  . Pneumococcal Conjugate-13 10/31/2014  . Pneumococcal Polysaccharide-23 12/21/2010  . Td 03/22/1995, 09/20/2007   Health  Maintenance Due  Topic Date Due  . Hepatitis C Screening  06/14/1946  . OPHTHALMOLOGY EXAM  09/03/1955  . ZOSTAVAX  09/02/2005  . DEXA SCAN  09/02/2010  . FOOT EXAM  11/09/2016   Eye exam to be scheduled.  Hep C declined. Zostavax declined. Dexa scan ordered.  Foot exam with CPE.   Patient Care Team: Biagio Borg, MD as PCP - General Rigoberto Noel, MD as Consulting Physician (Pulmonary Disease) Renato Shin, MD as Consulting Physician (Endocrinology) Izora Gala, MD as Consulting Physician (Otolaryngology)  Indicate any recent Medical Services you may have received from other than [facility] providers in the past year (date may be approximate).     Assessment:   This is a routine wellness examination for [facility]. Physical assessment deferred to PCP.   Hearing/Vision screen Hearing Screening Comments: Able to hear conversational tones w/o difficulty. Last exam June 2017. Tube in right ear since oral surgery.  Vision Screening Comments: Wears corrective lenses,   Dietary issues and exercise activities discussed: Current Exercise Habits: The patient does not participate in regular exercise at present (housework and yard work), Exercise limited by: None identified   Diet (meal preparation, eat out, water intake, caffeinated beverages, dairy products, fruits and vegetables): Prepares own meals. Occasional fruit. Drinks sweet (splenda) tea (4 glasses/day).   Breakfast: Egg, bacon, coffee. Typically skips breakfast.  Lunch: sandwich Dinner: chicken (fried, baked), noodles, vegetables.   Encouraged to make healthy food options and increase water intake. Discussed diabetic diet.  Discussed increasing exercise, patient not interested. Patient stays active with housework and yard/gardening.   Goals      Patient Stated   . <enter goal here> (pt-stated)          Maintain current health to stay healthy.       Depression Screen PHQ 2/9 Scores 11/10/2016 11/10/2015 10/31/2014  PHQ - 2  Score 0 0 0    Fall Risk Fall Risk  11/10/2016 11/10/2015 10/31/2014  Falls in the past year? No No No    Cognitive Function:       Ad8 score reviewed for issues:  Issues making decisions:no  Less interest in hobbies / activities:  Repeats questions,  stories (family complaining):no  Trouble using ordinary gadgets (microwave, computer, phone):no  Forgets the month or year: no  Mismanaging finances: no  Remembering appts:no  Daily problems with thinking and/or memory:no Ad8 score is=0     Screening Tests Health Maintenance  Topic Date Due  . Hepatitis C Screening  12-21-1945  . OPHTHALMOLOGY EXAM  09/03/1955  . ZOSTAVAX  09/02/2005  . DEXA SCAN  09/02/2010  . FOOT EXAM  11/09/2016  . HEMOGLOBIN A1C  05/04/2017  . TETANUS/TDAP  09/19/2017  . URINE MICROALBUMIN  11/04/2017  . COLONOSCOPY  03/19/2018  . MAMMOGRAM  03/28/2018  . INFLUENZA VACCINE  Addressed  . PNA vac Low Risk Adult  Completed      Plan:     Eat heart healthy diet (full of fruits, vegetables, whole grains, lean protein, water--limit salt, fat, and sugar intake) and increase physical activity as tolerated.  Continue doing brain stimulating activities (puzzles, reading, adult coloring books, staying active) to keep memory sharp.   Complete advanced directives and bring copy.   Make eye appointment.   During the course of the visit, Jeweliana was educated and counseled about the following appropriate screening and preventive services:   Vaccines to include Pneumoccal, Influenza, Hepatitis B, Td, Zostavax, HCV  Cardiovascular disease screening  Colorectal cancer screening  Bone density screening  Diabetes screening  Glaucoma screening  Mammography/PAP  Nutrition counseling  Patient Instructions (the written plan) were given to the patient.    Gerilyn Nestle, RN   11/10/2016   Medical screening examination/treatment/procedure(s) were performed by non-physician practitioner and as  supervising physician I was immediately available for consultation/collaboration. I agree with above. Cathlean Cower, MD

## 2016-11-10 ENCOUNTER — Ambulatory Visit (INDEPENDENT_AMBULATORY_CARE_PROVIDER_SITE_OTHER): Payer: Medicare Other | Admitting: Internal Medicine

## 2016-11-10 ENCOUNTER — Encounter: Payer: Self-pay | Admitting: Internal Medicine

## 2016-11-10 VITALS — BP 130/70 | HR 65 | Temp 98.5°F | Ht 62.0 in | Wt 122.1 lb

## 2016-11-10 DIAGNOSIS — R3129 Other microscopic hematuria: Secondary | ICD-10-CM | POA: Diagnosis not present

## 2016-11-10 DIAGNOSIS — Z780 Asymptomatic menopausal state: Secondary | ICD-10-CM

## 2016-11-10 DIAGNOSIS — J45901 Unspecified asthma with (acute) exacerbation: Secondary | ICD-10-CM

## 2016-11-10 DIAGNOSIS — J4521 Mild intermittent asthma with (acute) exacerbation: Secondary | ICD-10-CM

## 2016-11-10 DIAGNOSIS — J45909 Unspecified asthma, uncomplicated: Secondary | ICD-10-CM | POA: Insufficient documentation

## 2016-11-10 DIAGNOSIS — E1140 Type 2 diabetes mellitus with diabetic neuropathy, unspecified: Secondary | ICD-10-CM | POA: Diagnosis not present

## 2016-11-10 DIAGNOSIS — L309 Dermatitis, unspecified: Secondary | ICD-10-CM

## 2016-11-10 DIAGNOSIS — Z0000 Encounter for general adult medical examination without abnormal findings: Secondary | ICD-10-CM

## 2016-11-10 DIAGNOSIS — Z0001 Encounter for general adult medical examination with abnormal findings: Secondary | ICD-10-CM

## 2016-11-10 MED ORDER — AZITHROMYCIN 250 MG PO TABS: ORAL_TABLET | ORAL | 1 refills | Status: DC

## 2016-11-10 MED ORDER — PROMETHAZINE-CODEINE 6.25-10 MG/5ML PO SYRP: 5.0000 mL | ORAL_SOLUTION | ORAL | 0 refills | Status: AC | PRN

## 2016-11-10 MED ORDER — BUDESONIDE-FORMOTEROL FUMARATE 160-4.5 MCG/ACT IN AERO: 2.0000 | INHALATION_SPRAY | Freq: Two times a day (BID) | RESPIRATORY_TRACT | 12 refills | Status: DC

## 2016-11-10 MED ORDER — ALBUTEROL SULFATE HFA 108 (90 BASE) MCG/ACT IN AERS: 2.0000 | INHALATION_SPRAY | Freq: Four times a day (QID) | RESPIRATORY_TRACT | 11 refills | Status: DC | PRN

## 2016-11-10 MED ORDER — ASPIRIN EC 81 MG PO TBEC: 81.0000 mg | DELAYED_RELEASE_TABLET | Freq: Every day | ORAL | 11 refills | Status: AC

## 2016-11-10 MED ORDER — PREDNISONE 10 MG PO TABS: ORAL_TABLET | ORAL | 0 refills | Status: DC

## 2016-11-10 NOTE — Progress Notes (Signed)
Subjective:    Patient ID: [name] [name], female    DOB: 08/28/1946, 70 y.o.   MRN: [id number]  HPI  Here for wellness and f/u;  Overall doing ok;  Pt denies Chest pain, worsening SOB, DOE, orthopnea, PND, worsening LE edema, palpitations, dizziness or syncope.  Pt denies neurological change such as new headache, facial or extremity weakness.   Pt states overall good compliance with treatment and medications, good tolerability, and has been trying to follow appropriate diet.  Pt denies worsening depressive symptoms, suicidal ideation or panic. No fever, night sweats, wt loss, loss of appetite, or other constitutional symptoms.  Pt states good ability with ADL's, has low fall risk, home safety reviewed and adequate, no other significant changes in hearing or vision, and only occasionally active with exercise.    Pt denies polydipsia, polyuria, or low sugar symptoms such as weakness or confusion improved with po intake.  Pt states overall good compliance with meds, trying to follow lower cholesterol, diabetic diet, wt overall stable but little exercise however.   No other changes to history except Here with acute onset mild to mod 2-3 days ST, HA, general weakness and malaise, with prod cough greenish sputum with mild wheezing, worse since could not afford advair recently.  Needs refill for eczema ointment, as ran out and rash worsening to left wrist Past Medical History:  Diagnosis Date  . ARTHRITIS 01/30/2008  . ASTHMA 09/03/2007  . Cancer ([place]) 1987   palate cancer  . COLON CANCER 04/07/2008   pt states she had colon cancer 1987  . COPD 09/03/2007  . COPD (chronic obstructive pulmonary disease) ([place]) 04/22/2013  . CORONARY ARTERY DISEASE 09/03/2007  . DEGENERATIVE JOINT DISEASE 01/30/2008  . DIABETES MELLITUS 01/30/2008  . Diabetic peripheral neuropathy ([place]) 04/22/2013  . Eczema   . GERD 09/03/2007  . GLUCOSE INTOLERANCE 09/03/2007  . GOITER, MULTINODULAR 05/21/2010  . History of blood transfusion    . HYPERCHOLESTEROLEMIA 09/03/2007  . HYPERTENSION 09/03/2007  . Impaired glucose tolerance 04/10/2011  . LVH (left ventricular hypertrophy)   . OBESITY 09/03/2007  . Osteopenia   . Psoriasis   . Shortness of breath dyspnea    With exertion and allergies to pollen   Past Surgical History:  Procedure Laterality Date  . ABDOMINAL HYSTERECTOMY    . CARDIAC CATHETERIZATION    . COLON SURGERY    . FLOOR OF MOUTH BIOPSY N/A 12/24/2015   Procedure: ORAL BIOPSY;  Surgeon: Izora Gala, MD;  Location: [facility];  Service: ENT;  Laterality: N/A;  . HEMICOLECTOMY  12/1985   cancer  . HERNIA REPAIR    . MAXILLECTOMY Right 01/22/2016   Procedure: RIGHT SIDE PARTIAL MAXILLECTOMY;  Surgeon: Izora Gala, MD;  Location: [facility];  Service: ENT;  Laterality: Right;  . TUBAL LIGATION      reports that she quit smoking about 37 years ago. Her smoking use included Cigarettes. She has a 15.00 pack-year smoking history. She has never used smokeless tobacco. She reports that she does not drink alcohol or use drugs. family history includes Cancer in her other and sister; Colon cancer in her other and paternal aunt; Heart disease in her mother; Liver disease in her father; Thyroid disease in her brother and sister. Allergies  Allergen Reactions  . Simvastatin Other (See Comments)    Muscle spasms   Current Outpatient Prescriptions on File Prior to Visit  Medication Sig Dispense Refill  . acyclovir cream (ZOVIRAX) 5 % Apply 1 application topically daily  as needed. For psoriasis    . amLODipine (NORVASC) 5 MG tablet TAKE 1 TABLET (5 MG TOTAL) BY MOUTH DAILY. 30 tablet 2  . Blood Glucose Monitoring Suppl (ONE TOUCH ULTRA SYSTEM KIT) W/DEVICE KIT 1 kit by Does not apply route once. 250.02 1 each 0  . clobetasol (TEMOVATE) 0.05 % ointment Use twice once daily to hand and feet as needed (Patient taking differently: Apply 1 application topically 2 (two) times daily. Use twice once daily to hand and feet as needed) 30 g 1  .  fluocinolone (SYNALAR) 0.025 % ointment Apply topically 2 (two) times daily as needed. (Patient taking differently: Apply 1 application topically 2 (two) times daily as needed. For psoriasis) 30 g 1  . glucose blood (ONE TOUCH ULTRA TEST) test strip 1 each by Other route daily. Dx Code E11.40 100 each 1  . hydrochlorothiazide (HYDRODIURIL) 25 MG tablet TAKE 1 TABLET EVERY DAY 90 tablet 2  . ketoconazole (NIZORAL) 2 % cream Apply topically 3 (three) times daily. use as directed two times a day as needed under breast 15 g 3  . loratadine-pseudoephedrine (CLARITIN-D 24-HOUR) 10-240 MG per 24 hr tablet Take 1 tablet by mouth daily.    . lovastatin (MEVACOR) 40 MG tablet TAKE 2 TABLETS EVERY DAY 60 tablet 9  . metFORMIN (GLUCOPHAGE-XR) 500 MG 24 hr tablet Take 2 tablets (1,000 mg total) by mouth daily. 180 tablet 3  . methimazole (TAPAZOLE) 5 MG tablet TAKE 1 TABLET BY MOUTH 3 TIMES A WEEK. 30 tablet 2  . metoprolol tartrate (LOPRESSOR) 25 MG tablet TAKE 1 & 1/2 TABLETS TWICE A DAY 90 tablet 2  . naproxen (NAPROSYN) 500 MG tablet TAKE 1 TABLET (500 MG TOTAL) BY MOUTH 2 (TWO) TIMES DAILY WITH A MEAL. (Patient taking differently: TAKE 1 TABLET (500 MG TOTAL) BY MOUTH once a day WITH A MEAL.) 60 tablet 3  . omeprazole (PRILOSEC) 20 MG capsule TAKE 1 CAPSULE EVERY DAY 90 capsule 1  . ONETOUCH DELICA LANCETS 33G MISC 1 Device by Other route daily. Use to check blood sugars daily E11.9 200 each 0  . potassium chloride (KLOR-CON 10) 10 MEQ tablet Take 1 tablet (10 mEq total) by mouth daily. 90 tablet 3  . tiZANidine (ZANAFLEX) 4 MG tablet Take 1 tablet (4 mg total) by mouth every 6 (six) hours as needed for muscle spasms. 60 tablet 1  . traMADol (ULTRAM) 50 MG tablet TAKE 1 TABLET EVERY 6 HOURS AS NEEDED FOR PAIN 120 tablet 3  . HYDROcodone-homatropine (HYCODAN) 5-1.5 MG/5ML syrup Take 5 mLs by mouth every 6 (six) hours as needed for cough. (Patient not taking: Reported on 11/10/2016) 180 mL 0   No current  facility-administered medications on file prior to visit.    Review of Systems Constitutional: Negative for increased diaphoresis, or other activity, appetite or siginficant weight change other than noted HENT: Negative for worsening hearing loss, ear pain, facial swelling, mouth sores and neck stiffness.   Eyes: Negative for other worsening pain, redness or visual disturbance.  Respiratory: Negative for choking or stridor Cardiovascular: Negative for other chest pain and palpitations.  Gastrointestinal: Negative for worsening diarrhea, blood in stool, or abdominal distention Genitourinary: Negative for hematuria, flank pain or change in urine volume.  Musculoskeletal: Negative for myalgias or other joint complaints.  Skin: Negative for other color change and wound or drainage.  Neurological: Negative for syncope and numbness. other than noted Hematological: Negative for adenopathy. or other swelling Psychiatric/Behavioral: Negative for hallucinations, SI,   self-injury, decreased concentration or other worsening agitation.  All other system neg per pt    Objective:   Physical Exam BP 130/70 (BP Location: Left Arm, Patient Position: Sitting, Cuff Size: Normal)   Pulse 65   Temp 98.5 F (36.9 C) (Oral)   Ht 5' 2" (1.575 m)   Wt 122 lb 1.9 oz (55.4 kg)   SpO2 98%   BMI 22.34 kg/m  VS noted,  Constitutional: Pt is oriented to person, place, and time. Appears well-developed and well-nourished, in no significant distress, mild ill appaering Head: Normocephalic and atraumatic  Eyes: Conjunctivae and EOM are normal. Pupils are equal, round, and reactive to light Right Ear: External ear normal.  Left Ear: External ear normal Nose: Nose normal.  Bilat tm's with mild erythema.  Max sinus areas non tender.  Pharynx with mild erythema, no exudate Mouth/Throat: Oropharynx is clear and moist  Neck: Normal range of motion. Neck supple. No JVD present. No tracheal deviation present or significant  neck LA or mass Cardiovascular: Normal rate, regular rhythm, normal heart sounds and intact distal pulses.   Pulmonary/Chest: Effort normal and breath sounds without rales or wheezing  Abdominal: Soft. Bowel sounds are normal. NT. No HSM  Musculoskeletal: Normal range of motion. Exhibits no edema Lymphadenopathy: Has no cervical adenopathy.  Neurological: Pt is alert and oriented to person, place, and time. Pt has normal reflexes. No cranial nerve deficit. Motor grossly intact Skin: Skin is warm and dry. No rash noted or new ulcers Psychiatric:  Has normal mood and affect. Behavior is normal.     Assessment & Plan:   

## 2016-11-10 NOTE — Patient Instructions (Addendum)
Ok to change the Aspirin 325 mg to the 81 mg per day  You had the steroid shot today  Please take all new medication as prescribed - the antibiotic, cough medicine if needed, and prednisone  Please continue all other medications as before, and refills have been done if requested - including the hardcopy of the albuterol Inhaler and symbicort  Please have the pharmacy call with any other refills you may need.  Please continue your efforts at being more active, low cholesterol diet, and weight control.  You are otherwise up to date with prevention measures today.  Please keep your appointments with your specialists as you may have planned  Please schedule the bone density test before leaving today at the scheduling desk (where you check out)  Your lab work was OK   Please return in 6 months, or sooner if needed, with Lab testing done 3-5 days before    Eat heart healthy diet (full of fruits, vegetables, whole grains, lean protein, water--limit salt, fat, and sugar intake) and increase physical activity as tolerated.  Continue doing brain stimulating activities (puzzles, reading, adult coloring books, staying active) to keep memory sharp.   Complete advanced directives and bring copy.   Make eye appointment.    Fall Prevention in the Home Introduction Falls can cause injuries. They can happen to people of all ages. There are many things you can do to make your home safe and to help prevent falls. What can I do on the outside of my home?  Regularly fix the edges of walkways and driveways and fix any cracks.  Remove anything that might make you trip as you walk through a door, such as a raised step or threshold.  Trim any bushes or trees on the path to your home.  Use bright outdoor lighting.  Clear any walking paths of anything that might make someone trip, such as rocks or tools.  Regularly check to see if handrails are loose or broken. Make sure that both sides of any  steps have handrails.  Any raised decks and porches should have guardrails on the edges.  Have any leaves, snow, or ice cleared regularly.  Use sand or salt on walking paths during winter.  Clean up any spills in your garage right away. This includes oil or grease spills. What can I do in the bathroom?  Use night lights.  Install grab bars by the toilet and in the tub and shower. Do not use towel bars as grab bars.  Use non-skid mats or decals in the tub or shower.  If you need to sit down in the shower, use a plastic, non-slip stool.  Keep the floor dry. Clean up any water that spills on the floor as soon as it happens.  Remove soap buildup in the tub or shower regularly.  Attach bath mats securely with double-sided non-slip rug tape.  Do not have throw rugs and other things on the floor that can make you trip. What can I do in the bedroom?  Use night lights.  Make sure that you have a light by your bed that is easy to reach.  Do not use any sheets or blankets that are too big for your bed. They should not hang down onto the floor.  Have a firm chair that has side arms. You can use this for support while you get dressed.  Do not have throw rugs and other things on the floor that can make you trip. What can  I do in the kitchen?  Clean up any spills right away.  Avoid walking on wet floors.  Keep items that you use a lot in easy-to-reach places.  If you need to reach something above you, use a strong step stool that has a grab bar.  Keep electrical cords out of the way.  Do not use floor polish or wax that makes floors slippery. If you must use wax, use non-skid floor wax.  Do not have throw rugs and other things on the floor that can make you trip. What can I do with my stairs?  Do not leave any items on the stairs.  Make sure that there are handrails on both sides of the stairs and use them. Fix handrails that are broken or loose. Make sure that handrails are  as long as the stairways.  Check any carpeting to make sure that it is firmly attached to the stairs. Fix any carpet that is loose or worn.  Avoid having throw rugs at the top or bottom of the stairs. If you do have throw rugs, attach them to the floor with carpet tape.  Make sure that you have a light switch at the top of the stairs and the bottom of the stairs. If you do not have them, ask someone to add them for you. What else can I do to help prevent falls?  Wear shoes that:  Do not have high heels.  Have rubber bottoms.  Are comfortable and fit you well.  Are closed at the toe. Do not wear sandals.  If you use a stepladder:  Make sure that it is fully opened. Do not climb a closed stepladder.  Make sure that both sides of the stepladder are locked into place.  Ask someone to hold it for you, if possible.  Clearly mark and make sure that you can see:  Any grab bars or handrails.  First and last steps.  Where the edge of each step is.  Use tools that help you move around (mobility aids) if they are needed. These include:  Canes.  Walkers.  Scooters.  Crutches.  Turn on the lights when you go into a dark area. Replace any light bulbs as soon as they burn out.  Set up your furniture so you have a clear path. Avoid moving your furniture around.  If any of your floors are uneven, fix them.  If there are any pets around you, be aware of where they are.  Review your medicines with your doctor. Some medicines can make you feel dizzy. This can increase your chance of falling. Ask your doctor what other things that you can do to help prevent falls. This information is not intended to replace advice given to you by your health care provider. Make sure you discuss any questions you have with your health care provider. Document Released: 09/24/2009 Document Revised: 05/05/2016 Document Reviewed: 01/02/2015  2017 [employer]  Health Maintenance,  Female Introduction Adopting a healthy lifestyle and getting preventive care can go a long way to promote health and wellness. Talk with your health care provider about what schedule of regular examinations is right for you. This is a good chance for you to check in with your provider about disease prevention and staying healthy. In between checkups, there are plenty of things you can do on your own. Experts have done a lot of research about which lifestyle changes and preventive measures are most likely to keep you healthy. Ask your health care  provider for more information. Weight and diet Eat a healthy diet  Be sure to include plenty of vegetables, fruits, low-fat dairy products, and lean protein.  Do not eat a lot of foods high in solid fats, added sugars, or salt.  Get regular exercise. This is one of the most important things you can do for your health.  Most adults should exercise for at least 150 minutes each week. The exercise should increase your heart rate and make you sweat (moderate-intensity exercise).  Most adults should also do strengthening exercises at least twice a week. This is in addition to the moderate-intensity exercise. Maintain a healthy weight  Body mass index (BMI) is a measurement that can be used to identify possible weight problems. It estimates body fat based on height and weight. Your health care provider can help determine your BMI and help you achieve or maintain a healthy weight.  For females 61 years of age and older:  A BMI below 18.5 is considered underweight.  A BMI of 18.5 to 24.9 is normal.  A BMI of 25 to 29.9 is considered overweight.  A BMI of 30 and above is considered obese. Watch levels of cholesterol and blood lipids  You should start having your blood tested for lipids and cholesterol at 71 years of age, then have this test every 5 years.  You may need to have your cholesterol levels checked more often if:  Your lipid or cholesterol  levels are high.  You are older than 71 years of age.  You are at high risk for heart disease. Cancer screening Lung Cancer  Lung cancer screening is recommended for adults 76-38 years old who are at high risk for lung cancer because of a history of smoking.  A yearly low-dose CT scan of the lungs is recommended for people who:  Currently smoke.  Have quit within the past 15 years.  Have at least a 30-pack-year history of smoking. A pack year is smoking an average of one pack of cigarettes a day for 1 year.  Yearly screening should continue until it has been 15 years since you quit.  Yearly screening should stop if you develop a health problem that would prevent you from having lung cancer treatment. Breast Cancer  Practice breast self-awareness. This means understanding how your breasts normally appear and feel.  It also means doing regular breast self-exams. Let your health care provider know about any changes, no matter how small.  If you are in your 20s or 30s, you should have a clinical breast exam (CBE) by a health care provider every 1-3 years as part of a regular health exam.  If you are 54 or older, have a CBE every year. Also consider having a breast X-ray (mammogram) every year.  If you have a family history of breast cancer, talk to your health care provider about genetic screening.  If you are at high risk for breast cancer, talk to your health care provider about having an MRI and a mammogram every year.  Breast cancer gene (BRCA) assessment is recommended for women who have family members with BRCA-related cancers. BRCA-related cancers include:  Breast.  Ovarian.  Tubal.  Peritoneal cancers.  Results of the assessment will determine the need for genetic counseling and BRCA1 and BRCA2 testing. Cervical Cancer  Your health care provider may recommend that you be screened regularly for cancer of the pelvic organs (ovaries, uterus, and vagina). This screening  involves a pelvic examination, including checking for microscopic  changes to the surface of your cervix (Pap test). You may be encouraged to have this screening done every 3 years, beginning at age 77.  For women ages 66-65, health care providers may recommend pelvic exams and Pap testing every 3 years, or they may recommend the Pap and pelvic exam, combined with testing for human papilloma virus (HPV), every 5 years. Some types of HPV increase your risk of cervical cancer. Testing for HPV may also be done on women of any age with unclear Pap test results.  Other health care providers may not recommend any screening for nonpregnant women who are considered low risk for pelvic cancer and who do not have symptoms. Ask your health care provider if a screening pelvic exam is right for you.  If you have had past treatment for cervical cancer or a condition that could lead to cancer, you need Pap tests and screening for cancer for at least 20 years after your treatment. If Pap tests have been discontinued, your risk factors (such as having a new sexual partner) need to be reassessed to determine if screening should resume. Some women have medical problems that increase the chance of getting cervical cancer. In these cases, your health care provider may recommend more frequent screening and Pap tests. Colorectal Cancer  This type of cancer can be detected and often prevented.  Routine colorectal cancer screening usually begins at 71 years of age and continues through 71 years of age.  Your health care provider may recommend screening at an earlier age if you have risk factors for colon cancer.  Your health care provider may also recommend using home test kits to check for hidden blood in the stool.  A small camera at the end of a tube can be used to examine your colon directly (sigmoidoscopy or colonoscopy). This is done to check for the earliest forms of colorectal cancer.  Routine screening usually  begins at age 68.  Direct examination of the colon should be repeated every 5-10 years through 71 years of age. However, you may need to be screened more often if early forms of precancerous polyps or small growths are found. Skin Cancer  Check your skin from head to toe regularly.  Tell your health care provider about any new moles or changes in moles, especially if there is a change in a mole's shape or color.  Also tell your health care provider if you have a mole that is larger than the size of a pencil eraser.  Always use sunscreen. Apply sunscreen liberally and repeatedly throughout the day.  Protect yourself by wearing long sleeves, pants, a wide-brimmed hat, and sunglasses whenever you are outside. Heart disease, diabetes, and high blood pressure  High blood pressure causes heart disease and increases the risk of stroke. High blood pressure is more likely to develop in:  People who have blood pressure in the high end of the normal range (130-139/85-89 mm Hg).  People who are overweight or obese.  People who are African American.  If you are 93-30 years of age, have your blood pressure checked every 3-5 years. If you are 94 years of age or older, have your blood pressure checked every year. You should have your blood pressure measured twice-once when you are at a hospital or clinic, and once when you are not at a hospital or clinic. Record the average of the two measurements. To check your blood pressure when you are not at a hospital or clinic, you can use:  An automated blood pressure machine at a pharmacy.  A home blood pressure monitor.  If you are between 23 years and 21 years old, ask your health care provider if you should take aspirin to prevent strokes.  Have regular diabetes screenings. This involves taking a blood sample to check your fasting blood sugar level.  If you are at a normal weight and have a low risk for diabetes, have this test once every three years  after 71 years of age.  If you are overweight and have a high risk for diabetes, consider being tested at a younger age or more often. Preventing infection Hepatitis B  If you have a higher risk for hepatitis B, you should be screened for this virus. You are considered at high risk for hepatitis B if:  You were born in a country where hepatitis B is common. Ask your health care provider which countries are considered high risk.  Your parents were born in a high-risk country, and you have not been immunized against hepatitis B (hepatitis B vaccine).  You have HIV or AIDS.  You use needles to inject street drugs.  You live with someone who has hepatitis B.  You have had sex with someone who has hepatitis B.  You get hemodialysis treatment.  You take certain medicines for conditions, including cancer, organ transplantation, and autoimmune conditions. Hepatitis C  Blood testing is recommended for:  Everyone born from [birth year] through 1966.  Anyone with known risk factors for hepatitis C. Sexually transmitted infections (STIs)  You should be screened for sexually transmitted infections (STIs) including gonorrhea and chlamydia if:  You are sexually active and are younger than 71 years of age.  You are older than 71 years of age and your health care provider tells you that you are at risk for this type of infection.  Your sexual activity has changed since you were last screened and you are at an increased risk for chlamydia or gonorrhea. Ask your health care provider if you are at risk.  If you do not have HIV, but are at risk, it may be recommended that you take a prescription medicine daily to prevent HIV infection. This is called pre-exposure prophylaxis (PrEP). You are considered at risk if:  You are sexually active and do not regularly use condoms or know the HIV status of your partner(s).  You take drugs by injection.  You are sexually active with a partner who has HIV. Talk  with your health care provider about whether you are at high risk of being infected with HIV. If you choose to begin PrEP, you should first be tested for HIV. You should then be tested every 3 months for as long as you are taking PrEP. Pregnancy  If you are premenopausal and you may become pregnant, ask your health care provider about preconception counseling.  If you may become pregnant, take 400 to 800 micrograms (mcg) of folic acid every day.  If you want to prevent pregnancy, talk to your health care provider about birth control (contraception). Osteoporosis and menopause  Osteoporosis is a disease in which the bones lose minerals and strength with aging. This can result in serious bone fractures. Your risk for osteoporosis can be identified using a bone density scan.  If you are 87 years of age or older, or if you are at risk for osteoporosis and fractures, ask your health care provider if you should be screened.  Ask your health care provider whether you should take  a calcium or vitamin D supplement to lower your risk for osteoporosis.  Menopause may have certain physical symptoms and risks.  Hormone replacement therapy may reduce some of these symptoms and risks. Talk to your health care provider about whether hormone replacement therapy is right for you. Follow these instructions at home:  Schedule regular health, dental, and eye exams.  Stay current with your immunizations.  Do not use any tobacco products including cigarettes, chewing tobacco, or electronic cigarettes.  If you are pregnant, do not drink alcohol.  If you are breastfeeding, limit how much and how often you drink alcohol.  Limit alcohol intake to no more than 1 drink per day for nonpregnant women. One drink equals 12 ounces of beer, 5 ounces of wine, or 1 ounces of hard liquor.  Do not use street drugs.  Do not share needles.  Ask your health care provider for help if you need support or information  about quitting drugs.  Tell your health care provider if you often feel depressed.  Tell your health care provider if you have ever been abused or do not feel safe at home. This information is not intended to replace advice given to you by your health care provider. Make sure you discuss any questions you have with your health care provider. Document Released: 06/13/2011 Document Revised: 05/05/2016 Document Reviewed: 09/01/2015  2017 [employer]

## 2016-11-13 NOTE — Assessment & Plan Note (Signed)
stable overall by history and exam, recent data reviewed with pt, and pt to continue medical treatment as before,  to f/u any worsening symptoms or concerns Lab Results  Component Value Date   HGBA1C 6.2 11/04/2016

## 2016-11-13 NOTE — Assessment & Plan Note (Signed)
Mild to mod, for antibx course, cough med prn, depomedrol 80 IM, predpac asd, albuterol prn refill, and change advair to dulera hopefully less expensive, to f/u any worsening symptoms or concerns

## 2016-11-13 NOTE — Assessment & Plan Note (Signed)

## 2016-11-13 NOTE — Assessment & Plan Note (Signed)
Asympt, declines urology referral

## 2016-11-13 NOTE — Assessment & Plan Note (Signed)
stable overall by history and exam, and pt to continue medical treatment as before with steroid ointment refill,  to f/u any worsening symptoms or concerns

## 2016-12-21 ENCOUNTER — Ambulatory Visit (INDEPENDENT_AMBULATORY_CARE_PROVIDER_SITE_OTHER)
Admission: RE | Admit: 2016-12-21 | Discharge: 2016-12-21 | Disposition: A | Discharge status: Home / Self Care | Payer: Medicare Other | Source: Ambulatory Visit | Attending: Internal Medicine | Admitting: Internal Medicine

## 2016-12-21 DIAGNOSIS — Z780 Asymptomatic menopausal state: Secondary | ICD-10-CM

## 2016-12-24 ENCOUNTER — Other Ambulatory Visit: Payer: Self-pay | Admitting: Internal Medicine

## 2017-01-04 ENCOUNTER — Encounter: Payer: Self-pay | Admitting: Internal Medicine

## 2017-01-20 ENCOUNTER — Encounter: Payer: Self-pay | Admitting: Adult Health

## 2017-01-20 ENCOUNTER — Ambulatory Visit (INDEPENDENT_AMBULATORY_CARE_PROVIDER_SITE_OTHER): Payer: Medicare Other | Admitting: Adult Health

## 2017-01-20 ENCOUNTER — Ambulatory Visit: Payer: Medicare Other | Admitting: Pulmonary Disease

## 2017-01-20 ENCOUNTER — Other Ambulatory Visit: Payer: Self-pay | Admitting: Internal Medicine

## 2017-01-20 VITALS — BP 116/72 | HR 56 | Ht 62.0 in | Wt 131.0 lb

## 2017-01-20 DIAGNOSIS — J4530 Mild persistent asthma, uncomplicated: Secondary | ICD-10-CM | POA: Diagnosis not present

## 2017-01-20 DIAGNOSIS — R911 Solitary pulmonary nodule: Secondary | ICD-10-CM | POA: Diagnosis not present

## 2017-01-20 NOTE — Progress Notes (Signed)
'@Patient'  ID: [name] [name], female    DOB: Dec 29, 1945, 71 y.o.   MRN: [id number]  Chief Complaint  Patient presents with  . Follow-up    Asthma     Referring provider: Biagio Borg, MD  HPI:  71 -year-old female former smoker followed for asthmatic bronchitis, complicated by GERD, and  suspected component of vocal cord dysfunction. Lung nodule on CT .  Oral cancer dx 11/2015   TEST  /2014 Spirometry >> FEv1 84%  CT chest 01/2016 3 mm RLL nodules   01/20/2017 Follow up; Asthma /Lung nodule  Pt returns for a 1 year follow up for asthma and lung nodule.  Pt says over last year she has been doing well . No flare of cough or wheezing.  She is now on symbicort. She likes this better than Advair .   She says she did well from her oral cancer surgery . Was in early stages and did not require additional treatment other than excision.  She does have to wear an oral "arbitrator"   Previous CT scan in 01/2016 showed very small 3 mm RLL nodules. We discussed serial follow up . No hemoptysis . Wt is going back up, appetite is improving .    Allergies  Allergen Reactions  . Simvastatin Other (See Comments)    Muscle spasms    Immunization History  Administered Date(s) Administered  . H1N1 10/13/2008  . Influenza Split 07/31/2012  . Influenza Whole 09/12/2007, 09/12/2008, 08/20/2009, 08/26/2011  . Influenza, High Dose Seasonal PF 08/26/2014, 08/30/2016  . Influenza, Seasonal, Injecte, Preservative Fre 08/26/2013  . Influenza-Unspecified 08/27/2015  . Pneumococcal Conjugate-13 10/31/2014  . Pneumococcal Polysaccharide-23 12/21/2010  . Td 03/22/1995, 09/20/2007    Past Medical History:  Diagnosis Date  . ARTHRITIS 01/30/2008  . ASTHMA 09/03/2007  . Cancer ([place]) 1987   palate cancer  . COLON CANCER 04/07/2008   pt states she had colon cancer 1987  . COPD 09/03/2007  . COPD (chronic obstructive pulmonary disease) ([place]) 04/22/2013  . CORONARY ARTERY DISEASE 09/03/2007  .  DEGENERATIVE JOINT DISEASE 01/30/2008  . DIABETES MELLITUS 01/30/2008  . Diabetic peripheral neuropathy ([place]) 04/22/2013  . Eczema   . GERD 09/03/2007  . GLUCOSE INTOLERANCE 09/03/2007  . GOITER, MULTINODULAR 05/21/2010  . History of blood transfusion   . HYPERCHOLESTEROLEMIA 09/03/2007  . HYPERTENSION 09/03/2007  . Impaired glucose tolerance 04/10/2011  . LVH (left ventricular hypertrophy)   . OBESITY 09/03/2007  . Osteopenia   . Psoriasis   . Shortness of breath dyspnea    With exertion and allergies to pollen    Tobacco History: History  Smoking Status  . Former Smoker  . Packs/day: 1.00  . Years: 15.00  . Types: Cigarettes  . Quit date: 12/12/1978  Smokeless Tobacco  . Never Used   Counseling given: Not Answered   Outpatient Encounter Prescriptions as of 01/20/2017  Medication Sig  . acyclovir cream (ZOVIRAX) 5 % Apply 1 application topically daily as needed. For psoriasis  . albuterol (PROVENTIL HFA;VENTOLIN HFA) 108 (90 Base) MCG/ACT inhaler Inhale 2 puffs into the lungs every 6 (six) hours as needed.  Marland Kitchen amLODipine (NORVASC) 5 MG tablet TAKE 1 TABLET EVERY DAY  . aspirin EC 81 MG tablet Take 1 tablet (81 mg total) by mouth daily.  . Blood Glucose Monitoring Suppl (ONE TOUCH ULTRA SYSTEM KIT) W/DEVICE KIT 1 kit by Does not apply route once. 250.02  . budesonide-formoterol (SYMBICORT) 160-4.5 MCG/ACT inhaler Inhale 2 puffs into the lungs 2 (  two) times daily.  . clobetasol (TEMOVATE) 0.05 % ointment Use twice once daily to hand and feet as needed (Patient taking differently: Apply 1 application topically 2 (two) times daily. Use twice once daily to hand and feet as needed)  . fluocinolone (SYNALAR) 0.025 % ointment Apply topically 2 (two) times daily as needed. (Patient taking differently: Apply 1 application topically 2 (two) times daily as needed. For psoriasis)  . glucose blood (ONE TOUCH ULTRA TEST) test strip 1 each by Other route daily. Dx Code E11.40  . hydrochlorothiazide  (HYDRODIURIL) 25 MG tablet TAKE 1 TABLET EVERY DAY  . ketoconazole (NIZORAL) 2 % cream Apply topically 3 (three) times daily. use as directed two times a day as needed under breast  . loratadine-pseudoephedrine (CLARITIN-D 24-HOUR) 10-240 MG per 24 hr tablet Take 1 tablet by mouth daily.  Marland Kitchen lovastatin (MEVACOR) 40 MG tablet TAKE 2 TABLETS EVERY DAY  . metFORMIN (GLUCOPHAGE-XR) 500 MG 24 hr tablet Take 2 tablets (1,000 mg total) by mouth daily.  . methimazole (TAPAZOLE) 5 MG tablet TAKE 1 TABLET BY MOUTH 3 TIMES A WEEK.  . metoprolol tartrate (LOPRESSOR) 25 MG tablet TAKE 1 & 1/2 TABLETS TWICE A DAY  . naproxen (NAPROSYN) 500 MG tablet TAKE 1 TABLET (500 MG TOTAL) BY MOUTH 2 (TWO) TIMES DAILY WITH A MEAL. (Patient taking differently: TAKE 1 TABLET (500 MG TOTAL) BY MOUTH once a day WITH A MEAL.)  . omeprazole (PRILOSEC) 20 MG capsule TAKE 1 CAPSULE EVERY DAY  . ONETOUCH DELICA LANCETS 33X MISC 1 Device by Other route daily. Use to check blood sugars daily E11.9  . potassium chloride (KLOR-CON 10) 10 MEQ tablet Take 1 tablet (10 mEq total) by mouth daily.  Marland Kitchen tiZANidine (ZANAFLEX) 4 MG tablet Take 1 tablet (4 mg total) by mouth every 6 (six) hours as needed for muscle spasms.  . traMADol (ULTRAM) 50 MG tablet TAKE 1 TABLET EVERY 6 HOURS AS NEEDED FOR PAIN  . [DISCONTINUED] azithromycin (ZITHROMAX Z-PAK) 250 MG tablet 2 tab by mouth on day1, then 1 per day (Patient not taking: Reported on 01/20/2017)  . [DISCONTINUED] HYDROcodone-homatropine (HYCODAN) 5-1.5 MG/5ML syrup Take 5 mLs by mouth every 6 (six) hours as needed for cough. (Patient not taking: Reported on 01/20/2017)  . [DISCONTINUED] predniSONE (DELTASONE) 10 MG tablet 3 tabs by mouth per day for 3 days,2tabs per day for 3 days,1tab per day for 3 days (Patient not taking: Reported on 01/20/2017)   No facility-administered encounter medications on file as of 01/20/2017.      Review of Systems  Constitutional:   No  weight loss, night sweats,   Fevers, chills, fatigue, or  lassitude.  HEENT:   No headaches,  Difficulty swallowing,  Tooth/dental problems, or  Sore throat,                No sneezing, itching, ear ache, nasal congestion, post nasal drip,   CV:  No chest pain,  Orthopnea, PND, swelling in lower extremities, anasarca, dizziness, palpitations, syncope.   GI  No heartburn, indigestion, abdominal pain, nausea, vomiting, diarrhea, change in bowel habits, loss of appetite, bloody stools.   Resp: No shortness of breath with exertion or at rest.  No excess mucus, no productive cough,  No non-productive cough,  No coughing up of blood.  No change in color of mucus.  No wheezing.  No chest wall deformity  Skin: no rash or lesions.  GU: no dysuria, change in color of urine, no urgency or  frequency.  No flank pain, no hematuria   MS:  No joint pain or swelling.  No decreased range of motion.  No back pain.    Physical Exam  BP 116/72 (BP Location: Left Arm, Cuff Size: Normal)   Pulse (!) 56   Ht '5\' 2"'  (1.575 m)   Wt 131 lb (59.4 kg)   SpO2 100%   BMI 23.96 kg/m   GEN: A/Ox3; pleasant , NAD, well nourished    HEENT:  [place]/AT,  EACs-clear, TMs-wnl, NOSE-clear, THROAT-clear, no lesions, no postnasal drip or exudate noted.   NECK:  Supple w/ fair ROM; no JVD; normal carotid impulses w/o bruits; no thyromegaly or nodules palpated; no lymphadenopathy.    RESP  Clear  P & A; w/o, wheezes/ rales/ or rhonchi. no accessory muscle use, no dullness to percussion  CARD:  RRR, no m/r/g, no peripheral edema, pulses intact, no cyanosis or clubbing.  GI:   Soft & nt; nml bowel sounds; no organomegaly or masses detected.   Musco: Warm bil, no deformities or joint swelling noted.   Neuro: alert, no focal deficits noted.    Skin: Warm, no lesions or rashes  Psych:  No change in mood or affect. No depression or anxiety.  No memory loss.  Lab Results: ProBNP No results found for: PROBNP  Imaging: Dexascan  Result Date:  12/26/2016 Date of study: 12/21/16 Exam: DUAL X-RAY ABSORPTIOMETRY (DXA) FOR BONE MINERAL DENSITY (BMD) Instrument: [employer] Requesting Provider: PCP Indication: follow up for low BMD Comparison: (please note that it is not possible to compare data from different instruments) Clinical data: Pt is a 71 y.o. female with  previous history of fracture. On calcium and vitamin D. Results:  Lumbar spine (L1-L4) Femoral neck (FN) 33% distal radius T-score 2.7 RFN:-1.0 LFN:-1.5 n/a Change in BMD from previous DXA test (%) Up 11.3% Down 11.5% n/a (*) statistically significant Assessment: the BMD is low according to the [facility] classification for osteoporosis (see below). Fracture risk: moderate FRAX score: 10 year major osteoporotic risk: 7.5%. 10 year hip fracture risk: 1/1%. These are under the thresholds for treatment of 20% and 3%, respectively. Comments: the technical quality of the study is good.  Scoliosis and degenerative changes may falsely elevate spine score. Evaluation for secondary causes should be considered if clinically indicated. Recommend optimizing calcium (1200 mg/day) and vitamin D (800 IU/day) intake. Followup: Repeat BMD is appropriate after 2 years or after 1-2 years if starting treatment. WHO criteria for diagnosis of osteoporosis in postmenopausal women and in men 83 y/o or older: - normal: T-score -1.0 to + 1.0 - osteopenia/low bone density: T-score between -2.5 and -1.0 - osteoporosis: T-score below -2.5 - severe osteoporosis: T-score below -2.5 with history of fragility fracture Note: although not part of the WHO classification, the presence of a fragility fracture, regardless of the T-score, should be considered diagnostic of osteoporosis, provided other causes for the fracture have been excluded. Treatment: The National Osteoporosis Foundation recommends that treatment be considered in postmenopausal women and men age 109 or older with: 1. Hip or vertebral (clinical or morphometric)  fracture 2. T-score of - 2.5 or lower at the spine or hip 3. 10-year fracture probability by FRAX of at least 20% for a major osteoporotic fracture and 3% for a hip fracture Loura Pardon MD     Assessment & Plan:   Asthmatic bronchitis Well controlled without flare   Plan  Patient Instructions  Continue on Symbicort 2 puffs Twice daily  ,  rinse after use.  Set up CT chest to follow lung nodules .  follow up Dr. Elsworth Soho  In 1 year and As needed        Pulmonary nodule Incidental finding on CT in 01/2016  Will conttinue to follow serially  Check CT chest w/o contrast.      Rexene Edison, NP 01/20/2017

## 2017-01-20 NOTE — Assessment & Plan Note (Signed)
Well controlled without flare   Plan  Patient Instructions  Continue on Symbicort 2 puffs Twice daily  , rinse after use.  Set up CT chest to follow lung nodules .  follow up Dr. Elsworth Soho  In 1 year and As needed

## 2017-01-20 NOTE — Patient Instructions (Addendum)
Continue on Symbicort 2 puffs Twice daily  , rinse after use.  Set up CT chest to follow lung nodules .  follow up Dr. Elsworth Soho  In 1 year and As needed

## 2017-01-20 NOTE — Assessment & Plan Note (Signed)
Incidental finding on CT in 01/2016  Will conttinue to follow serially  Check CT chest w/o contrast.

## 2017-01-21 NOTE — Progress Notes (Signed)
Reviewed & agree with plan  

## 2017-01-26 DIAGNOSIS — Z85819 Personal history of malignant neoplasm of unspecified site of lip, oral cavity, and pharynx: Secondary | ICD-10-CM | POA: Diagnosis not present

## 2017-01-26 DIAGNOSIS — Z9889 Other specified postprocedural states: Secondary | ICD-10-CM | POA: Diagnosis not present

## 2017-01-30 ENCOUNTER — Ambulatory Visit: Payer: Medicare Other | Admitting: Endocrinology

## 2017-02-20 ENCOUNTER — Other Ambulatory Visit: Payer: Self-pay | Admitting: Internal Medicine

## 2017-02-21 ENCOUNTER — Ambulatory Visit (INDEPENDENT_AMBULATORY_CARE_PROVIDER_SITE_OTHER): Payer: Medicare Other | Admitting: Endocrinology

## 2017-02-21 ENCOUNTER — Ambulatory Visit
Admission: RE | Admit: 2017-02-21 | Discharge: 2017-02-21 | Disposition: A | Discharge status: Home / Self Care | Payer: Medicare Other | Source: Ambulatory Visit | Attending: Adult Health | Admitting: Adult Health

## 2017-02-21 ENCOUNTER — Encounter: Payer: Self-pay | Admitting: Endocrinology

## 2017-02-21 VITALS — BP 126/62 | HR 68 | Ht 62.0 in | Wt 132.0 lb

## 2017-02-21 DIAGNOSIS — E0590 Thyrotoxicosis, unspecified without thyrotoxic crisis or storm: Secondary | ICD-10-CM | POA: Diagnosis not present

## 2017-02-21 DIAGNOSIS — R911 Solitary pulmonary nodule: Secondary | ICD-10-CM

## 2017-02-21 DIAGNOSIS — R918 Other nonspecific abnormal finding of lung field: Secondary | ICD-10-CM | POA: Diagnosis not present

## 2017-02-21 LAB — TSH: TSH: 1.03 u[IU]/mL (ref 0.35–4.50)

## 2017-02-21 LAB — T4, FREE: FREE T4: 1.04 ng/dL (ref 0.60–1.60)

## 2017-02-21 NOTE — Patient Instructions (Signed)
Thyroid blood tests are requested for you today.  We'll let you know about the results.   Please come back for a follow-up appointment in 6 months.   if ever you have fever while taking methimazole, stop it and call us, because of the risk of a rare side-effect.    

## 2017-02-21 NOTE — Progress Notes (Signed)
Subjective:    Patient ID: [name] [name], female    DOB: 01-31-1946, 71 y.o.   MRN: [id number]  HPI Pt returns for f/u of a small multinodular goiter (dx'ed 2011; nodules have not met criteria for bx; f/u ultrasound in 2014 was unchanged; in 2017, CT showed normal thyroid;  in 2016, TSH was more suppressed; she was offered RAI, but chose tapazole). since on the tapazole, pt states she feels better in general.  Past Medical History:  Diagnosis Date  . ARTHRITIS 01/30/2008  . ASTHMA 09/03/2007  . Cancer ([place]) 1987   palate cancer  . COLON CANCER 04/07/2008   pt states she had colon cancer 1987  . COPD 09/03/2007  . COPD (chronic obstructive pulmonary disease) ([place]) 04/22/2013  . CORONARY ARTERY DISEASE 09/03/2007  . DEGENERATIVE JOINT DISEASE 01/30/2008  . DIABETES MELLITUS 01/30/2008  . Diabetic peripheral neuropathy ([place]) 04/22/2013  . Eczema   . GERD 09/03/2007  . GLUCOSE INTOLERANCE 09/03/2007  . GOITER, MULTINODULAR 05/21/2010  . History of blood transfusion   . HYPERCHOLESTEROLEMIA 09/03/2007  . HYPERTENSION 09/03/2007  . Impaired glucose tolerance 04/10/2011  . LVH (left ventricular hypertrophy)   . OBESITY 09/03/2007  . Osteopenia   . Psoriasis   . Shortness of breath dyspnea    With exertion and allergies to pollen    Past Surgical History:  Procedure Laterality Date  . ABDOMINAL HYSTERECTOMY    . CARDIAC CATHETERIZATION    . COLON SURGERY    . FLOOR OF MOUTH BIOPSY N/A 12/24/2015   Procedure: ORAL BIOPSY;  Surgeon: Izora Gala, MD;  Location: [facility];  Service: ENT;  Laterality: N/A;  . HEMICOLECTOMY  12/1985   cancer  . HERNIA REPAIR    . MAXILLECTOMY Right 01/22/2016   Procedure: RIGHT SIDE PARTIAL MAXILLECTOMY;  Surgeon: Izora Gala, MD;  Location: [facility];  Service: ENT;  Laterality: Right;  . TUBAL LIGATION      Social History   Social History  . Marital status: Married    Spouse name: N/A  . Number of children: 4  . Years of education: N/A   Occupational History   . stay at home Unemployed   Social History Main Topics  . Smoking status: Former Smoker    Packs/day: 1.00    Years: 15.00    Types: Cigarettes    Quit date: 12/12/1978  . Smokeless tobacco: Never Used  . Alcohol use No  . Drug use: No  . Sexual activity: Not on file   Other Topics Concern  . Not on file   Social History Narrative   1 child died with meningitis    Current Outpatient Prescriptions on File Prior to Visit  Medication Sig Dispense Refill  . acyclovir cream (ZOVIRAX) 5 % Apply 1 application topically daily as needed. For psoriasis    . albuterol (PROVENTIL HFA;VENTOLIN HFA) 108 (90 Base) MCG/ACT inhaler Inhale 2 puffs into the lungs every 6 (six) hours as needed. 1 Inhaler 11  . amLODipine (NORVASC) 5 MG tablet TAKE 1 TABLET EVERY DAY 30 tablet 6  . aspirin EC 81 MG tablet Take 1 tablet (81 mg total) by mouth daily. 90 tablet 11  . Blood Glucose Monitoring Suppl (ONE TOUCH ULTRA SYSTEM KIT) W/DEVICE KIT 1 kit by Does not apply route once. 250.02 1 each 0  . budesonide-formoterol (SYMBICORT) 160-4.5 MCG/ACT inhaler Inhale 2 puffs into the lungs 2 (two) times daily. 1 Inhaler 12  . clobetasol (TEMOVATE) 0.05 % ointment Use twice once  daily to hand and feet as needed (Patient taking differently: Apply 1 application topically 2 (two) times daily. Use twice once daily to hand and feet as needed) 30 g 1  . fluocinolone (SYNALAR) 0.025 % ointment Apply topically 2 (two) times daily as needed. (Patient taking differently: Apply 1 application topically 2 (two) times daily as needed. For psoriasis) 30 g 1  . glucose blood (ONE TOUCH ULTRA TEST) test strip 1 each by Other route daily. Dx Code E11.40 100 each 1  . hydrochlorothiazide (HYDRODIURIL) 25 MG tablet TAKE 1 TABLET EVERY DAY 90 tablet 2  . ketoconazole (NIZORAL) 2 % cream Apply topically 3 (three) times daily. use as directed two times a day as needed under breast 15 g 3  . loratadine-pseudoephedrine (CLARITIN-D 24-HOUR)  10-240 MG per 24 hr tablet Take 1 tablet by mouth daily.    Marland Kitchen lovastatin (MEVACOR) 40 MG tablet TAKE 2 TABLETS EVERY DAY 60 tablet 9  . metFORMIN (GLUCOPHAGE-XR) 500 MG 24 hr tablet TAKE 2 TABLETS BY MOUTH DAILY. 180 tablet 2  . methimazole (TAPAZOLE) 5 MG tablet TAKE 1 TABLET BY MOUTH 3 TIMES A WEEK. 30 tablet 2  . metoprolol tartrate (LOPRESSOR) 25 MG tablet TAKE 1 & 1/2 TABLETS TWICE A DAY 90 tablet 6  . naproxen (NAPROSYN) 500 MG tablet TAKE 1 TABLET TWICE A DAY WITH FOOD 60 tablet 2  . omeprazole (PRILOSEC) 20 MG capsule TAKE 1 CAPSULE EVERY DAY 90 capsule 1  . ONETOUCH DELICA LANCETS 58I MISC 1 Device by Other route daily. Use to check blood sugars daily E11.9 200 each 0  . potassium chloride (KLOR-CON 10) 10 MEQ tablet Take 1 tablet (10 mEq total) by mouth daily. 90 tablet 3  . tiZANidine (ZANAFLEX) 4 MG tablet Take 1 tablet (4 mg total) by mouth every 6 (six) hours as needed for muscle spasms. 60 tablet 1  . traMADol (ULTRAM) 50 MG tablet TAKE 1 TABLET EVERY 6 HOURS AS NEEDED FOR PAIN 120 tablet 3   No current facility-administered medications on file prior to visit.     Allergies  Allergen Reactions  . Simvastatin Other (See Comments)    Muscle spasms    Family History  Problem Relation Age of Onset  . Liver disease Father   . Heart disease Mother   . Colon cancer Paternal Aunt   . Thyroid disease Sister     overactive  . Cancer Sister     lung, adrenal gland  . Thyroid disease Brother     overactive  . Colon cancer Other     aunt  . Cancer Other     Colon Cancer-Aunt    BP 126/62   Pulse 68   Ht '5\' 2"'  (1.575 m)   Wt 132 lb (59.9 kg)   SpO2 98%   BMI 24.14 kg/m    Review of Systems Denies fever    Objective:   Physical Exam VITAL SIGNS:  See vs page GENERAL: no distress NECK: There is no palpable thyroid enlargement.  No thyroid nodule is palpable.  No palpable lymphadenopathy at the anterior neck.     Assessment & Plan:  Hyperthyroidism: due for  recheck.  Small multinodular goiter: she does not need repeat US now.   Patient is advised the following: Patient Instructions  Thyroid blood tests are requested for you today.  We'll let you know about the results.   Please come back for a follow-up appointment in 6 months.   if ever you have  fever while taking methimazole, stop it and call us, because of the risk of a rare side-effect.

## 2017-02-22 ENCOUNTER — Encounter: Payer: Self-pay | Admitting: Internal Medicine

## 2017-02-22 DIAGNOSIS — I2510 Atherosclerotic heart disease of native coronary artery without angina pectoris: Secondary | ICD-10-CM | POA: Insufficient documentation

## 2017-02-23 IMAGING — CT CT ABDOMEN W/ CM
2 of 11 series · 13 of 46 positions shown, 18 images · IV contrast ([ID] [id number] 300)
Comparison: None.

CLINICAL DATA: Oral cancer diagnosed in November 2015. History of
colon cancer with surgery and cervical or uterine cancer status post
hysterectomy. Ex-smoker.

EXAM:
CT CHEST AND ABDOMEN WITH CONTRAST
TECHNIQUE: Multidetector CT imaging of the chest and abdomen was performed
following the standard protocol during bolus administration of
intravenous contrast.
CONTRAST:  100mL 8QK31O-XYY IOPAMIDOL (8QK31O-XYY) INJECTION 61%

[Series 10: super d · axial · 0.69mm/px · z∈[-353,-3]mm · 10 of 421 slices shown, 15 images]
[im 36/421  soft-tissue]
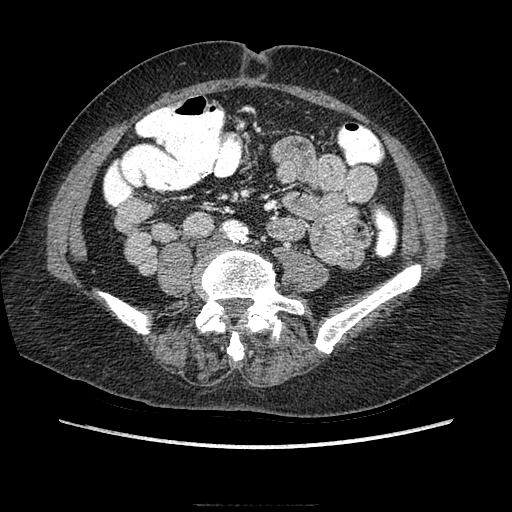
[im 36/421  bone]
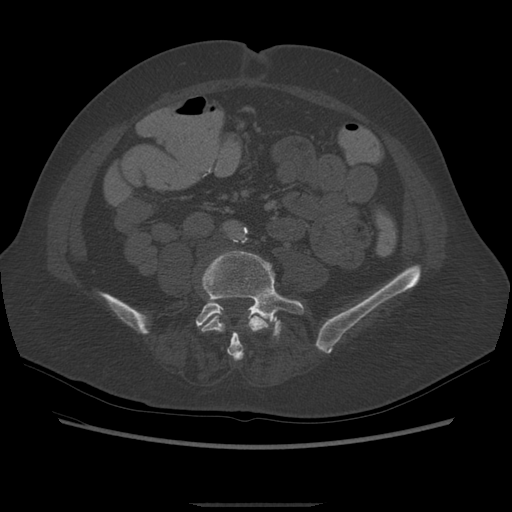
[im 71/421  soft-tissue]
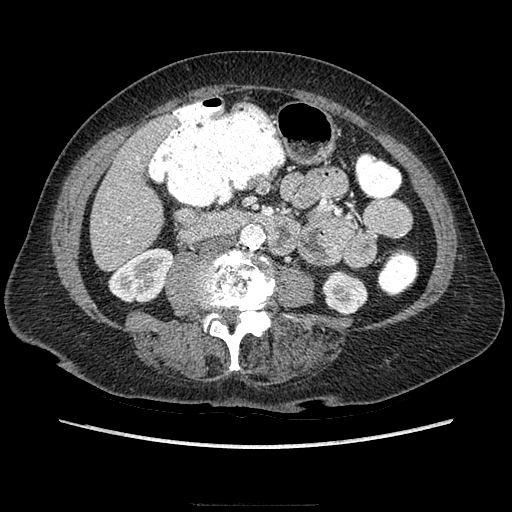
[im 141/421  soft-tissue]
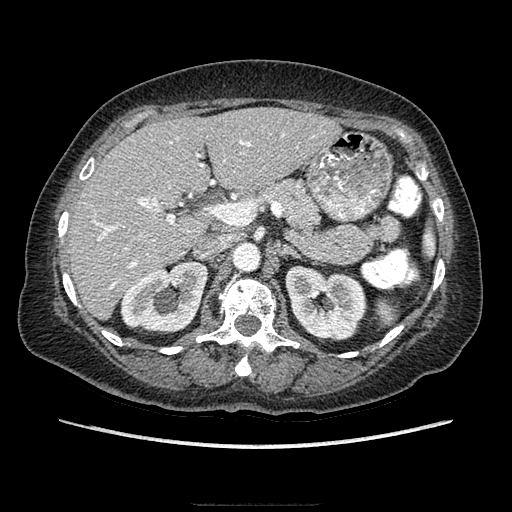
[im 176/421  soft-tissue]
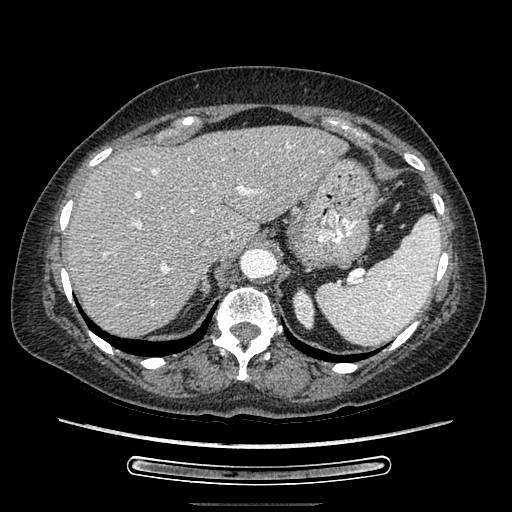
[im 211/421  soft-tissue]
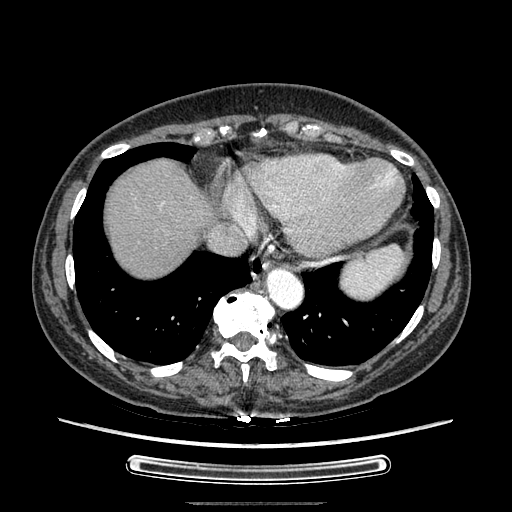
[im 246/421  soft-tissue]
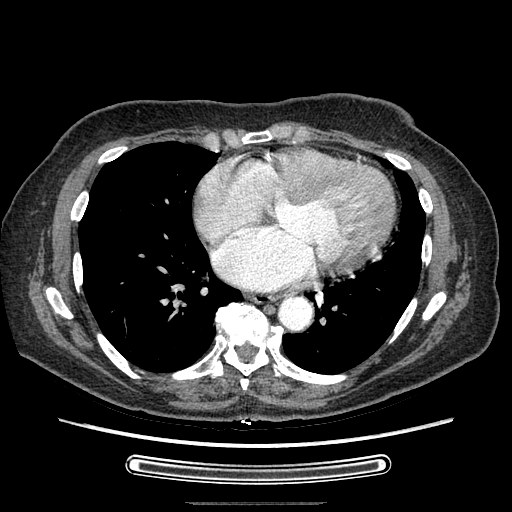
[im 281/421  soft-tissue]
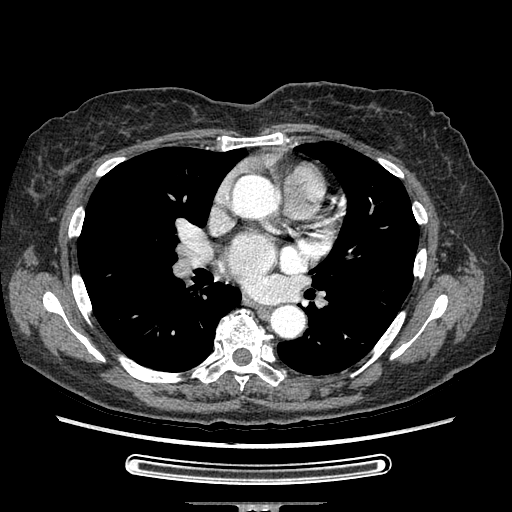
[im 281/421  lung]
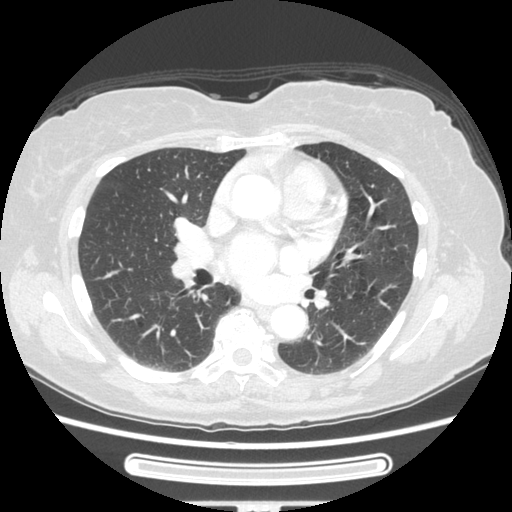
[im 316/421  lung]
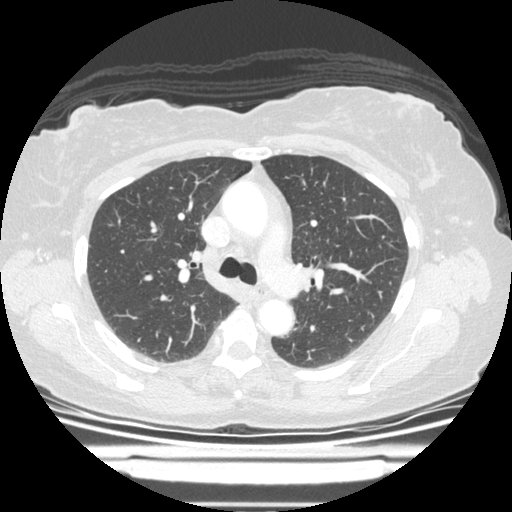
[im 351/421  soft-tissue]
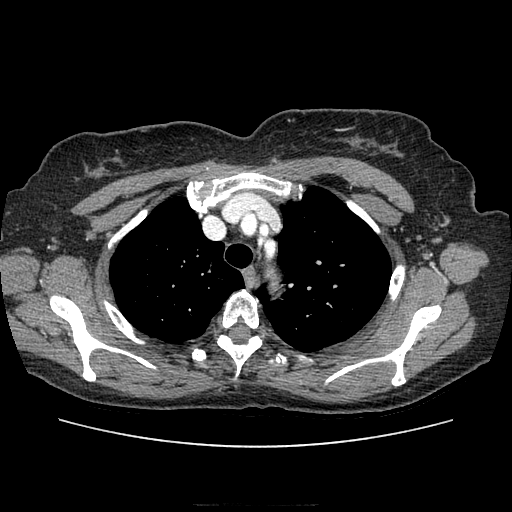
[im 351/421  lung]
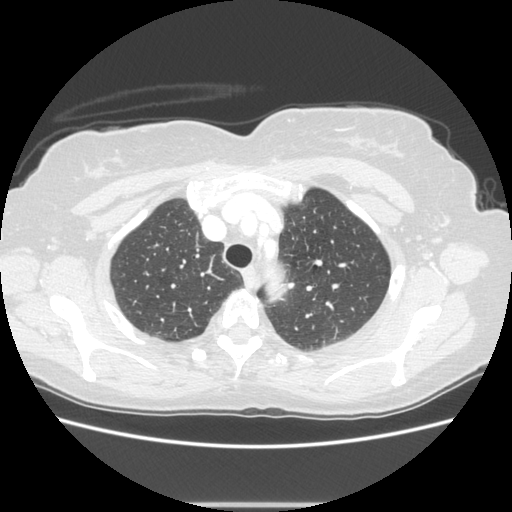
[im 386/421  soft-tissue]
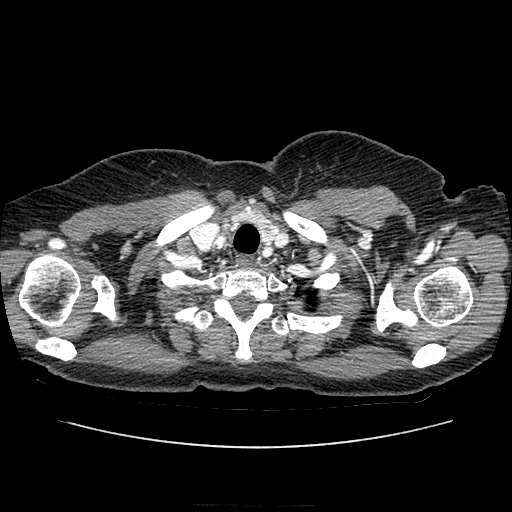
[im 386/421  lung]
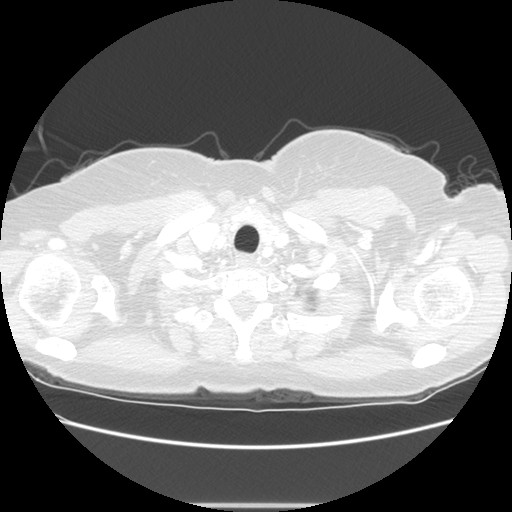
[im 386/421  bone]
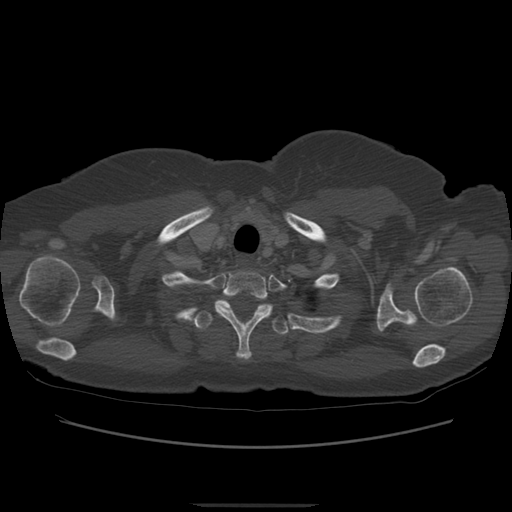

[Series 300: cor · coronal · 0.45mm/px · 3 of 93 slices shown]
[im 31/93  soft-tissue]
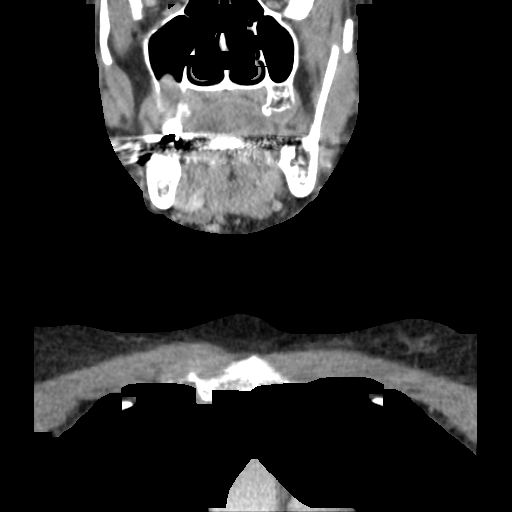
[im 41/93  soft-tissue]
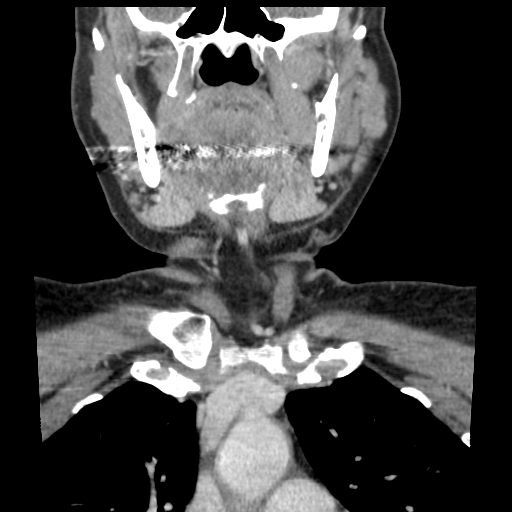
[im 52/93  soft-tissue]
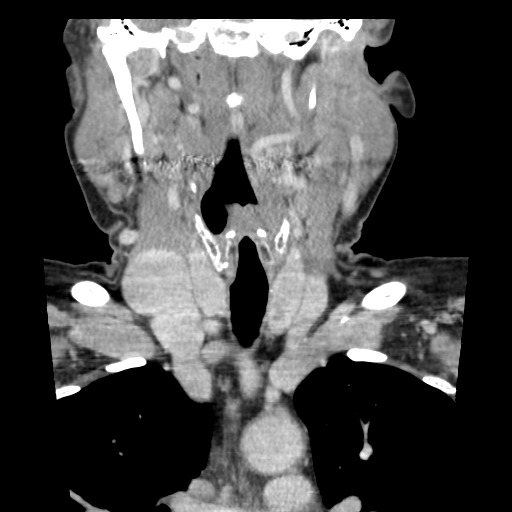

[13 of 46 positions shown; findings below may reference images not displayed]

FINDINGS: Neck findings are dictated separately.

CT CHEST

Mediastinum/Nodes: There are no enlarged mediastinal, hilar or
axillary lymph nodes. There is minimal thyroid heterogeneity. The
esophagus appears unremarkable. The heart is mildly enlarged. There
is no pericardial effusion. There is mild atherosclerosis of the
aorta, great vessels and coronary arteries.

Lungs/Pleura: There is no pleural effusion. There are 3 mm right
lower lobe subpleural nodules on images 40 and 42. No suspicious
pulmonary nodules are present. The lungs are otherwise clear.

Musculoskeletal/Chest wall: No chest wall mass or suspicious osseous
findings.

CT ABDOMEN FINDINGS

Hepatobiliary: There is a probable cyst inferiorly in the right
hepatic lobe, measuring 7 mm on image 63. There are scattered
calcified granulomas within the liver. No suspicious hepatic
findings. The gallbladder is contracted without apparent wall
thickening or surrounding inflammation. There is no biliary
dilatation.

Pancreas: Unremarkable. No pancreatic ductal dilatation or
surrounding inflammatory changes.

Spleen: Normal in size without focal abnormality.

Adrenals/Urinary Tract: Both adrenal glands appear normal. There are
small bilateral renal cysts. No evidence of renal mass, urinary
tract calculus or hydronephrosis. The bladder was not imaged.

Stomach/Bowel: Status post right hemicolectomy. No bowel wall
thickening or focal mucosal lesion identified within the visualized
bowel. Pelvis not imaged.

Vascular/Lymphatic: No enlarged abdominal lymph nodes. Moderate
aortoiliac atherosclerosis.

Other: Small umbilical hernia containing only fat. No ascites or
peritoneal nodularity.

Musculoskeletal: No acute or significant osseous findings. There are
degenerative changes throughout the thoracolumbar spine associated
with a mild convex right lumbar scoliosis.
IMPRESSION: 1. No findings suspicious for metastatic disease within the chest or
abdomen. No adenopathy.
2. There are tiny right lower lobe pulmonary nodules which are
nonspecific, but not highly suspicious. Stability can be addressed
with follow-up imaging as clinically warranted.
3. Hepatic and renal cysts.
4. Neck findings dictated separately.

## 2017-02-24 NOTE — Progress Notes (Signed)
Called spoke with patient, advised of CT results / recs as stated by TP.  Pt verbalized her understanding and denied any questions - pt aware of the incidental findings and will discuss with PCP at her upcoming appt.

## 2017-02-27 ENCOUNTER — Other Ambulatory Visit: Payer: Self-pay | Admitting: Internal Medicine

## 2017-02-27 DIAGNOSIS — Z1231 Encounter for screening mammogram for malignant neoplasm of breast: Secondary | ICD-10-CM

## 2017-03-20 ENCOUNTER — Other Ambulatory Visit: Payer: Self-pay | Admitting: Internal Medicine

## 2017-03-20 ENCOUNTER — Other Ambulatory Visit: Payer: Self-pay | Admitting: Endocrinology

## 2017-03-30 ENCOUNTER — Ambulatory Visit
Admission: RE | Admit: 2017-03-30 | Discharge: 2017-03-30 | Disposition: A | Discharge status: Home / Self Care | Payer: Medicare Other | Source: Ambulatory Visit | Attending: Internal Medicine | Admitting: Internal Medicine

## 2017-03-30 DIAGNOSIS — Z1231 Encounter for screening mammogram for malignant neoplasm of breast: Secondary | ICD-10-CM | POA: Diagnosis not present

## 2017-04-03 ENCOUNTER — Other Ambulatory Visit: Payer: Self-pay | Admitting: Internal Medicine

## 2017-04-03 DIAGNOSIS — R928 Other abnormal and inconclusive findings on diagnostic imaging of breast: Secondary | ICD-10-CM

## 2017-04-05 ENCOUNTER — Ambulatory Visit
Admission: RE | Admit: 2017-04-05 | Discharge: 2017-04-05 | Disposition: A | Discharge status: Home / Self Care | Payer: Medicare Other | Source: Ambulatory Visit | Attending: Internal Medicine | Admitting: Internal Medicine

## 2017-04-05 DIAGNOSIS — R928 Other abnormal and inconclusive findings on diagnostic imaging of breast: Secondary | ICD-10-CM

## 2017-04-18 ENCOUNTER — Other Ambulatory Visit: Payer: Self-pay | Admitting: Internal Medicine

## 2017-04-18 NOTE — Telephone Encounter (Signed)
faxed

## 2017-04-18 NOTE — Telephone Encounter (Signed)
Done hardcopy to Shirron  

## 2017-04-28 DIAGNOSIS — Z85819 Personal history of malignant neoplasm of unspecified site of lip, oral cavity, and pharynx: Secondary | ICD-10-CM | POA: Diagnosis not present

## 2017-05-11 ENCOUNTER — Other Ambulatory Visit (INDEPENDENT_AMBULATORY_CARE_PROVIDER_SITE_OTHER): Payer: Medicare Other

## 2017-05-11 DIAGNOSIS — E1140 Type 2 diabetes mellitus with diabetic neuropathy, unspecified: Secondary | ICD-10-CM | POA: Diagnosis not present

## 2017-05-11 LAB — BASIC METABOLIC PANEL
BUN: 11 mg/dL (ref 6–23)
CO2: 32 mEq/L (ref 19–32)
CREATININE: 0.81 mg/dL (ref 0.40–1.20)
Calcium: 9.3 mg/dL (ref 8.4–10.5)
Chloride: 105 mEq/L (ref 96–112)
GFR: 89.47 mL/min (ref >60.00)
Glucose, Bld: 90 mg/dL (ref 70–99)
Potassium: 4.3 mEq/L (ref 3.5–5.1)
Sodium: 142 mEq/L (ref 135–145)

## 2017-05-11 LAB — HEPATIC FUNCTION PANEL
ALT: 11 U/L (ref 0–35)
AST: 16 U/L (ref 0–37)
Albumin: 4 g/dL (ref 3.5–5.2)
Alkaline Phosphatase: 55 U/L (ref 39–117)
Bilirubin, Direct: 0.2 mg/dL (ref 0.0–0.3)
Total Bilirubin: 0.5 mg/dL (ref 0.2–1.2)
Total Protein: 6.3 g/dL (ref 6.0–8.3)

## 2017-05-11 LAB — LIPID PANEL
Cholesterol: 122 mg/dL (ref 0–200)
HDL: 58.5 mg/dL (ref >39.00)
LDL CALC: 51 mg/dL (ref 0–99)
NONHDL: 63.18
TRIGLYCERIDES: 62 mg/dL (ref 0.0–149.0)
Total CHOL/HDL Ratio: 2
VLDL: 12.4 mg/dL (ref 0.0–40.0)

## 2017-05-11 LAB — HEMOGLOBIN A1C: Hgb A1c MFr Bld: 6.2 % (ref 4.6–6.5)

## 2017-05-16 ENCOUNTER — Ambulatory Visit (INDEPENDENT_AMBULATORY_CARE_PROVIDER_SITE_OTHER): Payer: Medicare Other | Admitting: Internal Medicine

## 2017-05-16 ENCOUNTER — Encounter: Payer: Self-pay | Admitting: Internal Medicine

## 2017-05-16 VITALS — BP 122/78 | HR 61 | Ht 61.0 in | Wt 131.0 lb

## 2017-05-16 DIAGNOSIS — E1140 Type 2 diabetes mellitus with diabetic neuropathy, unspecified: Secondary | ICD-10-CM | POA: Diagnosis not present

## 2017-05-16 DIAGNOSIS — Z0000 Encounter for general adult medical examination without abnormal findings: Secondary | ICD-10-CM | POA: Diagnosis not present

## 2017-05-16 DIAGNOSIS — Z1159 Encounter for screening for other viral diseases: Secondary | ICD-10-CM | POA: Diagnosis not present

## 2017-05-16 DIAGNOSIS — E7800 Pure hypercholesterolemia, unspecified: Secondary | ICD-10-CM

## 2017-05-16 DIAGNOSIS — I10 Essential (primary) hypertension: Secondary | ICD-10-CM

## 2017-05-16 NOTE — Assessment & Plan Note (Signed)
stable overall by history and exam, recent data reviewed with pt, and pt to continue medical treatment as before,  to f/u any worsening symptoms or concerns Lab Results  Component Value Date   HGBA1C 6.2 05/11/2017

## 2017-05-16 NOTE — Assessment & Plan Note (Signed)
stable overall by history and exam, recent data reviewed with pt, and pt to continue medical treatment as before,  to f/u any worsening symptoms or concerns BP Readings from Last 3 Encounters:  05/16/17 122/78  02/21/17 126/62  01/20/17 116/72

## 2017-05-16 NOTE — Assessment & Plan Note (Signed)
stable overall by history and exam, recent data reviewed with pt, and pt to continue medical treatment as before,  to f/u any worsening symptoms or concerns Lab Results  Component Value Date   LDLCALC 51 05/11/2017

## 2017-05-16 NOTE — Patient Instructions (Signed)
Please continue all other medications as before, and refills have been done if requested.  Please have the pharmacy call with any other refills you may need.  Please continue your efforts at being more active, low cholesterol diet, and weight control.  You are otherwise up to date with prevention measures today.  Please keep your appointments with your specialists as you may have planned  Please return in 6 months, or sooner if needed, with Lab testing done 3-5 days before  

## 2017-05-16 NOTE — Progress Notes (Signed)
Subjective:    Patient ID: [name] [name], female    DOB: [birth year], 71 y.o.   MRN: [id number]  HPI  Here to f/u; overall doing ok,  Pt denies chest pain, increasing sob or doe, wheezing, orthopnea, PND, increased LE swelling, palpitations, dizziness or syncope.  Pt denies new neurological symptoms such as new headache, or facial or extremity weakness or numbness.  Pt denies polydipsia, polyuria, or low sugar episode.   Pt denies new neurological symptoms such as new headache, or facial or extremity weakness or numbness.   Pt states overall good compliance with meds, mostly trying to follow appropriate diet, with wt overall stable,  but little exercise however.. Not taking K and would like to come off b/c just does not tolerate the large pill after oral surgury for malignancy, but o/w doing well per pt per ENT Wt Readings from Last 3 Encounters:  05/16/17 131 lb (59.4 kg)  02/21/17 132 lb (59.9 kg)  01/20/17 131 lb (59.4 kg)   Past Medical History:  Diagnosis Date  . ARTHRITIS 01/30/2008  . ASTHMA 09/03/2007  . Cancer ([place]) 1987   palate cancer  . COLON CANCER 04/07/2008   pt states she had colon cancer 1987  . COPD 09/03/2007  . COPD (chronic obstructive pulmonary disease) ([place]) 04/22/2013  . CORONARY ARTERY DISEASE 09/03/2007  . DEGENERATIVE JOINT DISEASE 01/30/2008  . DIABETES MELLITUS 01/30/2008  . Diabetic peripheral neuropathy ([place]) 04/22/2013  . Eczema   . GERD 09/03/2007  . GLUCOSE INTOLERANCE 09/03/2007  . GOITER, MULTINODULAR 05/21/2010  . History of blood transfusion   . HYPERCHOLESTEROLEMIA 09/03/2007  . HYPERTENSION 09/03/2007  . Impaired glucose tolerance 04/10/2011  . LVH (left ventricular hypertrophy)   . OBESITY 09/03/2007  . Osteopenia   . Psoriasis   . Shortness of breath dyspnea    With exertion and allergies to pollen   Past Surgical History:  Procedure Laterality Date  . ABDOMINAL HYSTERECTOMY    . CARDIAC CATHETERIZATION    . COLON SURGERY    . FLOOR OF MOUTH  BIOPSY N/A 12/24/2015   Procedure: ORAL BIOPSY;  Surgeon: Izora Gala, MD;  Location: [facility];  Service: ENT;  Laterality: N/A;  . HEMICOLECTOMY  12/1985   cancer  . HERNIA REPAIR    . MAXILLECTOMY Right 01/22/2016   Procedure: RIGHT SIDE PARTIAL MAXILLECTOMY;  Surgeon: Izora Gala, MD;  Location: [facility];  Service: ENT;  Laterality: Right;  . TUBAL LIGATION      reports that she quit smoking about 38 years ago. Her smoking use included Cigarettes. She has a 15.00 pack-year smoking history. She has never used smokeless tobacco. She reports that she does not drink alcohol or use drugs. family history includes Cancer in her other and sister; Colon cancer in her other and paternal aunt; Heart disease in her mother; Liver disease in her father; Thyroid disease in her brother and sister. Allergies  Allergen Reactions  . Simvastatin Other (See Comments)    Muscle spasms   Current Outpatient Prescriptions on File Prior to Visit  Medication Sig Dispense Refill  . acyclovir cream (ZOVIRAX) 5 % Apply 1 application topically daily as needed. For psoriasis    . amLODipine (NORVASC) 5 MG tablet TAKE 1 TABLET EVERY DAY 30 tablet 6  . aspirin EC 81 MG tablet Take 1 tablet (81 mg total) by mouth daily. 90 tablet 11  . Blood Glucose Monitoring Suppl (ONE TOUCH ULTRA SYSTEM KIT) W/DEVICE KIT 1 kit by Does not apply  route once. 250.02 1 each 0  . budesonide-formoterol (SYMBICORT) 160-4.5 MCG/ACT inhaler Inhale 2 puffs into the lungs 2 (two) times daily. 1 Inhaler 12  . clobetasol (TEMOVATE) 0.05 % ointment Use twice once daily to hand and feet as needed (Patient taking differently: Apply 1 application topically 2 (two) times daily. Use twice once daily to hand and feet as needed) 30 g 1  . fluocinolone (SYNALAR) 0.025 % ointment Apply topically 2 (two) times daily as needed. (Patient taking differently: Apply 1 application topically 2 (two) times daily as needed. For psoriasis) 30 g 1  . hydrochlorothiazide  (HYDRODIURIL) 25 MG tablet TAKE 1 TABLET EVERY DAY 90 tablet 2  . ketoconazole (NIZORAL) 2 % cream Apply topically 3 (three) times daily. use as directed two times a day as needed under breast 15 g 3  . loratadine-pseudoephedrine (CLARITIN-D 24-HOUR) 10-240 MG per 24 hr tablet Take 1 tablet by mouth daily.    Marland Kitchen lovastatin (MEVACOR) 40 MG tablet TAKE 2 TABLETS EVERY DAY 60 tablet 9  . metFORMIN (GLUCOPHAGE-XR) 500 MG 24 hr tablet TAKE 2 TABLETS BY MOUTH DAILY. 180 tablet 2  . methimazole (TAPAZOLE) 5 MG tablet TAKE 1 TABLET BY MOUTH 3 TIMES A WEEK. 30 tablet 1  . metoprolol tartrate (LOPRESSOR) 25 MG tablet TAKE 1 & 1/2 TABLETS TWICE A DAY 90 tablet 6  . naproxen (NAPROSYN) 500 MG tablet TAKE 1 TABLET TWICE A DAY WITH FOOD 60 tablet 2  . omeprazole (PRILOSEC) 20 MG capsule TAKE 1 CAPSULE EVERY DAY 90 capsule 1  . ONE TOUCH ULTRA TEST test strip TEST ONCE DAILY. E11.9 100 each 1  . ONETOUCH DELICA LANCETS 84O MISC USE TO CHECK BLOOD SUGAR ONCE DAILY 200 each 0  . tiZANidine (ZANAFLEX) 4 MG tablet Take 1 tablet (4 mg total) by mouth every 6 (six) hours as needed for muscle spasms. 60 tablet 1  . traMADol (ULTRAM) 50 MG tablet TAKE 1 TABLET EVERY 6 HOURS AS NEEDED FOR PAIN 120 tablet 1   No current facility-administered medications on file prior to visit.    Review of Systems  Constitutional: Negative for other unusual diaphoresis or sweats HENT: Negative for ear discharge or swelling Eyes: Negative for other worsening visual disturbances Respiratory: Negative for stridor or other swelling  Gastrointestinal: Negative for worsening distension or other blood Genitourinary: Negative for retention or other urinary change Musculoskeletal: Negative for other MSK pain or swelling Skin: Negative for color change or other new lesions Neurological: Negative for worsening tremors and other numbness  Psychiatric/Behavioral: Negative for worsening agitation or other fatigue All other system neg per pt      Objective:   Physical Exam BP 122/78   Pulse 61   Ht _0  (1.549 m)   Wt 131 lb (59.4 kg)   SpO2 98%   BMI 24.75 kg/m  VS noted,  Constitutional: Pt appears in NAD HENT: Head: NCAT.  Right Ear: External ear normal.  Left Ear: External ear normal.  Eyes: . Pupils are equal, round, and reactive to light. Conjunctivae and EOM are normal Nose: without d/c or deformity Neck: Neck supple. Gross normal ROM Cardiovascular: Normal rate and regular rhythm.   Pulmonary/Chest: Effort normal and breath sounds without rales or wheezing.  Abd:  Soft, NT, ND, + BS, no organomegaly Neurological: Pt is alert. At baseline orientation, motor grossly intact Skin: Skin is warm. No rashes, other new lesions, no LE edema Psychiatric: Pt behavior is normal without agitation  No other exam  findings  Lab Results  Component Value Date   WBC 7.7 11/04/2016   HGB 11.6 (L) 11/04/2016   HCT 35.6 (L) 11/04/2016   PLT 203.0 11/04/2016   GLUCOSE 90 05/11/2017   CHOL 122 05/11/2017   TRIG 62.0 05/11/2017   HDL 58.50 05/11/2017   LDLCALC 51 05/11/2017   ALT 11 05/11/2017   AST 16 05/11/2017   NA 142 05/11/2017   K 4.3 05/11/2017   CL 105 05/11/2017   CREATININE 0.81 05/11/2017   BUN 11 05/11/2017   CO2 32 05/11/2017   TSH 1.03 02/21/2017   INR 1.04 02/08/2016   HGBA1C 6.2 05/11/2017   MICROALBUR 0.9 11/04/2016       Assessment & Plan:

## 2017-06-06 ENCOUNTER — Other Ambulatory Visit: Payer: Self-pay

## 2017-06-06 MED ORDER — METOPROLOL TARTRATE 25 MG PO TABS: ORAL_TABLET | ORAL | 3 refills | Status: DC

## 2017-06-09 ENCOUNTER — Other Ambulatory Visit: Payer: Self-pay | Admitting: Internal Medicine

## 2017-06-20 ENCOUNTER — Telehealth: Payer: Self-pay | Admitting: Internal Medicine

## 2017-06-20 ENCOUNTER — Other Ambulatory Visit: Payer: Self-pay | Admitting: Internal Medicine

## 2017-06-20 MED ORDER — METOPROLOL TARTRATE 25 MG PO TABS: ORAL_TABLET | ORAL | 1 refills | Status: DC

## 2017-06-20 MED ORDER — AMLODIPINE BESYLATE 5 MG PO TABS: 5.0000 mg | ORAL_TABLET | Freq: Every day | ORAL | 1 refills | Status: DC

## 2017-06-20 NOTE — Telephone Encounter (Signed)
Pt would like a refill of amLODipine (NORVASC) 5 MG tablet And metoprolol tartrate (LOPRESSOR) 25 MG tablet   [facility] [place] [facility] [address]

## 2017-06-20 NOTE — Telephone Encounter (Signed)
Done

## 2017-07-03 DIAGNOSIS — H11153 Pinguecula, bilateral: Secondary | ICD-10-CM | POA: Diagnosis not present

## 2017-07-03 DIAGNOSIS — H2513 Age-related nuclear cataract, bilateral: Secondary | ICD-10-CM | POA: Diagnosis not present

## 2017-07-03 DIAGNOSIS — E119 Type 2 diabetes mellitus without complications: Secondary | ICD-10-CM | POA: Diagnosis not present

## 2017-07-03 DIAGNOSIS — H47233 Glaucomatous optic atrophy, bilateral: Secondary | ICD-10-CM | POA: Diagnosis not present

## 2017-07-16 ENCOUNTER — Other Ambulatory Visit: Payer: Self-pay | Admitting: Internal Medicine

## 2017-07-17 ENCOUNTER — Other Ambulatory Visit: Payer: Self-pay | Admitting: Internal Medicine

## 2017-07-30 ENCOUNTER — Other Ambulatory Visit: Payer: Self-pay | Admitting: Endocrinology

## 2017-08-15 ENCOUNTER — Other Ambulatory Visit: Payer: Self-pay | Admitting: Internal Medicine

## 2017-08-24 ENCOUNTER — Other Ambulatory Visit (INDEPENDENT_AMBULATORY_CARE_PROVIDER_SITE_OTHER): Payer: Medicare Other

## 2017-08-24 ENCOUNTER — Ambulatory Visit (INDEPENDENT_AMBULATORY_CARE_PROVIDER_SITE_OTHER): Payer: Medicare Other | Admitting: Endocrinology

## 2017-08-24 ENCOUNTER — Encounter: Payer: Self-pay | Admitting: Endocrinology

## 2017-08-24 VITALS — BP 132/78 | HR 64 | Wt 130.6 lb

## 2017-08-24 DIAGNOSIS — E0590 Thyrotoxicosis, unspecified without thyrotoxic crisis or storm: Secondary | ICD-10-CM

## 2017-08-24 DIAGNOSIS — Z23 Encounter for immunization: Secondary | ICD-10-CM | POA: Diagnosis not present

## 2017-08-24 LAB — T4, FREE: Free T4: 1.21 ng/dL (ref 0.60–1.60)

## 2017-08-24 LAB — TSH: TSH: 0.31 u[IU]/mL — ABNORMAL LOW (ref 0.35–4.50)

## 2017-08-24 MED ORDER — METHIMAZOLE 5 MG PO TABS: 5.0000 mg | ORAL_TABLET | Freq: Every day | ORAL | 1 refills | Status: DC

## 2017-08-24 NOTE — Progress Notes (Signed)
Subjective:    Patient ID: [name] [name], female    DOB: [birth year], 71 y.o.   MRN: [id number]  HPI Pt returns for f/u of a small multinodular goiter and hyperthyroidism (dx'ed 2011; nodules have not met criteria for bx; f/u ultrasound in 2014 was unchanged; in 2017, CT showed normal thyroid; in 2016, TSH was more suppressed; she was offered RAI, but chose tapazole). since on the tapazole, pt states she feels better in general.   Past Medical History:  Diagnosis Date  . ARTHRITIS 01/30/2008  . ASTHMA 09/03/2007  . Cancer ([place]) 1987   palate cancer  . COLON CANCER 04/07/2008   pt states she had colon cancer 1987  . COPD 09/03/2007  . COPD (chronic obstructive pulmonary disease) ([place]) 04/22/2013  . CORONARY ARTERY DISEASE 09/03/2007  . DEGENERATIVE JOINT DISEASE 01/30/2008  . DIABETES MELLITUS 01/30/2008  . Diabetic peripheral neuropathy ([place]) 04/22/2013  . Eczema   . GERD 09/03/2007  . GLUCOSE INTOLERANCE 09/03/2007  . GOITER, MULTINODULAR 05/21/2010  . History of blood transfusion   . HYPERCHOLESTEROLEMIA 09/03/2007  . HYPERTENSION 09/03/2007  . Impaired glucose tolerance 04/10/2011  . LVH (left ventricular hypertrophy)   . OBESITY 09/03/2007  . Osteopenia   . Psoriasis   . Shortness of breath dyspnea    With exertion and allergies to pollen    Past Surgical History:  Procedure Laterality Date  . ABDOMINAL HYSTERECTOMY    . CARDIAC CATHETERIZATION    . COLON SURGERY    . FLOOR OF MOUTH BIOPSY N/A 12/24/2015   Procedure: ORAL BIOPSY;  Surgeon: Izora Gala, MD;  Location: [facility];  Service: ENT;  Laterality: N/A;  . HEMICOLECTOMY  12/1985   cancer  . HERNIA REPAIR    . MAXILLECTOMY Right 01/22/2016   Procedure: RIGHT SIDE PARTIAL MAXILLECTOMY;  Surgeon: Izora Gala, MD;  Location: [facility];  Service: ENT;  Laterality: Right;  . TUBAL LIGATION      Social History   Social History  . Marital status: Married    Spouse name: N/A  . Number of children: 4  . Years of education: N/A    Occupational History  . stay at home Unemployed   Social History Main Topics  . Smoking status: Former Smoker    Packs/day: 1.00    Years: 15.00    Types: Cigarettes    Quit date: 12/12/1978  . Smokeless tobacco: Never Used  . Alcohol use No  . Drug use: No  . Sexual activity: Not on file   Other Topics Concern  . Not on file   Social History Narrative   1 child died with meningitis    Current Outpatient Prescriptions on File Prior to Visit  Medication Sig Dispense Refill  . acyclovir cream (ZOVIRAX) 5 % Apply 1 application topically daily as needed. For psoriasis    . amLODipine (NORVASC) 5 MG tablet Take 1 tablet (5 mg total) by mouth daily. 90 tablet 1  . aspirin EC 81 MG tablet Take 1 tablet (81 mg total) by mouth daily. 90 tablet 11  . Blood Glucose Monitoring Suppl (ONE TOUCH ULTRA SYSTEM KIT) W/DEVICE KIT 1 kit by Does not apply route once. 250.02 1 each 0  . budesonide-formoterol (SYMBICORT) 160-4.5 MCG/ACT inhaler Inhale 2 puffs into the lungs 2 (two) times daily. 1 Inhaler 12  . clobetasol (TEMOVATE) 0.05 % ointment Use twice once daily to hand and feet as needed (Patient taking differently: Apply 1 application topically 2 (two) times daily. Use twice  once daily to hand and feet as needed) 30 g 1  . fluocinolone (SYNALAR) 0.025 % ointment Apply topically 2 (two) times daily as needed. (Patient taking differently: Apply 1 application topically 2 (two) times daily as needed. For psoriasis) 30 g 1  . hydrochlorothiazide (HYDRODIURIL) 25 MG tablet TAKE 1 TABLET EVERY DAY 90 tablet 0  . ketoconazole (NIZORAL) 2 % cream Apply topically 3 (three) times daily. use as directed two times a day as needed under breast 15 g 3  . loratadine-pseudoephedrine (CLARITIN-D 24-HOUR) 10-240 MG per 24 hr tablet Take 1 tablet by mouth daily.    Marland Kitchen lovastatin (MEVACOR) 40 MG tablet TAKE 2 TABLETS EVERY DAY 60 tablet 11  . metFORMIN (GLUCOPHAGE-XR) 500 MG 24 hr tablet TAKE 2 TABLETS BY MOUTH  DAILY. 180 tablet 2  . metoprolol tartrate (LOPRESSOR) 25 MG tablet TAKE 1 & 1/2 TABLETS TWICE A DAY 90 tablet 1  . metoprolol tartrate (LOPRESSOR) 25 MG tablet TAKE 1 & 1/2 TABLETS TWICE A DAY 90 tablet 0  . naproxen (NAPROSYN) 500 MG tablet TAKE 1 TABLET TWICE A DAY WITH FOOD 60 tablet 2  . omeprazole (PRILOSEC) 20 MG capsule TAKE 1 CAPSULE EVERY DAY 90 capsule 0  . ONE TOUCH ULTRA TEST test strip TEST ONCE DAILY. E11.9 100 each 1  . ONETOUCH DELICA LANCETS 16X MISC USE TO CHECK BLOOD SUGAR ONCE DAILY 200 each 0  . tiZANidine (ZANAFLEX) 4 MG tablet Take 1 tablet (4 mg total) by mouth every 6 (six) hours as needed for muscle spasms. 60 tablet 1  . traMADol (ULTRAM) 50 MG tablet TAKE 1 TABLET EVERY 6 HOURS AS NEEDED FOR PAIN 120 tablet 1   No current facility-administered medications on file prior to visit.     Allergies  Allergen Reactions  . Simvastatin Other (See Comments)    Muscle spasms    Family History  Problem Relation Age of Onset  . Liver disease Father   . Heart disease Mother   . Colon cancer Paternal Aunt   . Thyroid disease Sister        overactive  . Cancer Sister        lung, adrenal gland  . Thyroid disease Brother        overactive  . Colon cancer Other        aunt  . Cancer Other        Colon Cancer-Aunt  . Breast cancer Neg Hx     BP 132/78   Pulse 64   Wt 130 lb 9.6 oz (59.2 kg)   SpO2 96%   BMI 24.68 kg/m    Review of Systems No fever.     Objective:   Physical Exam VITAL SIGNS:  See vs page GENERAL: no distress NECK: I can palpate a right thyroid nodule (longitudonal, 2x1 cm).    Lab Results  Component Value Date   TSH 0.31 (L) 08/24/2017      Assessment & Plan:  Hyperthyroidism: she needs increased rx.  I have sent a prescription to your pharmacy, to increase tapazole.  Multinodular goiter: low risk in view of hyperthyroidism: we'll follow for now.   Patient Instructions  Thyroid blood tests are requested for you today.  We'll  let you know about the results.   Please come back for a follow-up appointment in 6 months.   if ever you have fever while taking methimazole, stop it and call us, because of the risk of a rare side-effect.

## 2017-08-24 NOTE — Patient Instructions (Signed)
Thyroid blood tests are requested for you today.  We'll let you know about the results.   Please come back for a follow-up appointment in 6 months.   if ever you have fever while taking methimazole, stop it and call us, because of the risk of a rare side-effect.    

## 2017-08-29 DIAGNOSIS — Z85819 Personal history of malignant neoplasm of unspecified site of lip, oral cavity, and pharynx: Secondary | ICD-10-CM | POA: Diagnosis not present

## 2017-10-14 ENCOUNTER — Other Ambulatory Visit: Payer: Self-pay | Admitting: Internal Medicine

## 2017-10-16 ENCOUNTER — Other Ambulatory Visit: Payer: Self-pay | Admitting: Internal Medicine

## 2017-10-26 ENCOUNTER — Other Ambulatory Visit: Payer: Self-pay | Admitting: Internal Medicine

## 2017-11-13 ENCOUNTER — Other Ambulatory Visit (INDEPENDENT_AMBULATORY_CARE_PROVIDER_SITE_OTHER): Payer: Medicare Other

## 2017-11-13 DIAGNOSIS — Z0000 Encounter for general adult medical examination without abnormal findings: Secondary | ICD-10-CM | POA: Diagnosis not present

## 2017-11-13 DIAGNOSIS — Z1159 Encounter for screening for other viral diseases: Secondary | ICD-10-CM

## 2017-11-13 DIAGNOSIS — E1140 Type 2 diabetes mellitus with diabetic neuropathy, unspecified: Secondary | ICD-10-CM

## 2017-11-13 LAB — HEMOGLOBIN A1C: HEMOGLOBIN A1C: 6 % (ref 4.6–6.5)

## 2017-11-13 LAB — HEPATIC FUNCTION PANEL
ALK PHOS: 54 U/L (ref 39–117)
ALT: 17 U/L (ref 0–35)
AST: 23 U/L (ref 0–37)
Albumin: 4.2 g/dL (ref 3.5–5.2)
BILIRUBIN DIRECT: 0.2 mg/dL (ref 0.0–0.3)
Total Bilirubin: 0.7 mg/dL (ref 0.2–1.2)
Total Protein: 6.8 g/dL (ref 6.0–8.3)

## 2017-11-13 LAB — CBC WITH DIFFERENTIAL/PLATELET
BASOS ABS: 0 10*3/uL (ref 0.0–0.1)
Basophils Relative: 0.6 % (ref 0.0–3.0)
Eosinophils Absolute: 0.3 10*3/uL (ref 0.0–0.7)
Eosinophils Relative: 4.9 % (ref 0.0–5.0)
HCT: 37.4 % (ref 36.0–46.0)
HEMOGLOBIN: 11.9 g/dL — AB (ref 12.0–15.0)
LYMPHS ABS: 2.1 10*3/uL (ref 0.7–4.0)
Lymphocytes Relative: 32.2 % (ref 12.0–46.0)
MCHC: 32 g/dL (ref 30.0–36.0)
MCV: 80.3 fl (ref 78.0–100.0)
MONOS PCT: 8.9 % (ref 3.0–12.0)
Monocytes Absolute: 0.6 10*3/uL (ref 0.1–1.0)
NEUTROS PCT: 53.4 % (ref 43.0–77.0)
Neutro Abs: 3.5 10*3/uL (ref 1.4–7.7)
Platelets: 223 10*3/uL (ref 150.0–400.0)
RBC: 4.65 Mil/uL (ref 3.87–5.11)
RDW: 17 % — ABNORMAL HIGH (ref 11.5–15.5)
WBC: 6.6 10*3/uL (ref 4.0–10.5)

## 2017-11-13 LAB — URINALYSIS, ROUTINE W REFLEX MICROSCOPIC
BILIRUBIN URINE: NEGATIVE
HGB URINE DIPSTICK: NEGATIVE
Ketones, ur: NEGATIVE
LEUKOCYTES UA: NEGATIVE
NITRITE: NEGATIVE
Specific Gravity, Urine: 1.01 (ref 1.000–1.030)
TOTAL PROTEIN, URINE-UPE24: NEGATIVE
Urine Glucose: NEGATIVE
Urobilinogen, UA: 0.2 (ref 0.0–1.0)
pH: 6 (ref 5.0–8.0)

## 2017-11-13 LAB — TSH: TSH: 0.41 u[IU]/mL (ref 0.35–4.50)

## 2017-11-13 LAB — LIPID PANEL
CHOL/HDL RATIO: 2
Cholesterol: 154 mg/dL (ref 0–200)
HDL: 63.5 mg/dL (ref >39.00)
LDL CALC: 79 mg/dL (ref 0–99)
NonHDL: 90.78
Triglycerides: 61 mg/dL (ref 0.0–149.0)
VLDL: 12.2 mg/dL (ref 0.0–40.0)

## 2017-11-13 LAB — BASIC METABOLIC PANEL
BUN: 16 mg/dL (ref 6–23)
CHLORIDE: 103 meq/L (ref 96–112)
CO2: 30 meq/L (ref 19–32)
CREATININE: 0.77 mg/dL (ref 0.40–1.20)
Calcium: 9.5 mg/dL (ref 8.4–10.5)
GFR: 94.71 mL/min (ref >60.00)
Glucose, Bld: 78 mg/dL (ref 70–99)
Potassium: 3.7 mEq/L (ref 3.5–5.1)
Sodium: 142 mEq/L (ref 135–145)

## 2017-11-14 LAB — HEPATITIS C ANTIBODY
HEP C AB: NONREACTIVE
SIGNAL TO CUT-OFF: 0.01 (ref <1.00)

## 2017-11-14 LAB — MICROALBUMIN / CREATININE URINE RATIO
Creatinine,U: 51.7 mg/dL
Microalb Creat Ratio: 1.4 mg/g (ref 0.0–30.0)
Microalb, Ur: <0.7 mg/dL (ref 0.0–1.9)

## 2017-11-22 ENCOUNTER — Encounter: Payer: Self-pay | Admitting: Internal Medicine

## 2017-11-22 ENCOUNTER — Ambulatory Visit (INDEPENDENT_AMBULATORY_CARE_PROVIDER_SITE_OTHER): Payer: Medicare Other | Admitting: Internal Medicine

## 2017-11-22 ENCOUNTER — Other Ambulatory Visit: Payer: Self-pay | Admitting: Pharmacist

## 2017-11-22 VITALS — BP 118/72 | HR 67 | Temp 98.4°F | Ht 61.0 in | Wt 131.0 lb

## 2017-11-22 DIAGNOSIS — Z0000 Encounter for general adult medical examination without abnormal findings: Secondary | ICD-10-CM | POA: Diagnosis not present

## 2017-11-22 DIAGNOSIS — E1140 Type 2 diabetes mellitus with diabetic neuropathy, unspecified: Secondary | ICD-10-CM

## 2017-11-22 DIAGNOSIS — Z23 Encounter for immunization: Secondary | ICD-10-CM | POA: Diagnosis not present

## 2017-11-22 MED ORDER — BUDESONIDE-FORMOTEROL FUMARATE 160-4.5 MCG/ACT IN AERO: 2.0000 | INHALATION_SPRAY | Freq: Two times a day (BID) | RESPIRATORY_TRACT | 3 refills | Status: DC

## 2017-11-22 MED ORDER — OMEPRAZOLE 20 MG PO CPDR: DELAYED_RELEASE_CAPSULE | ORAL | 3 refills | Status: DC

## 2017-11-22 MED ORDER — LOVASTATIN 40 MG PO TABS: 80.0000 mg | ORAL_TABLET | Freq: Every day | ORAL | 3 refills | Status: DC

## 2017-11-22 MED ORDER — NAPROXEN 500 MG PO TABS
500.0000 mg | ORAL_TABLET | Freq: Two times a day (BID) | ORAL | 2 refills | Status: DC
Start: 2017-11-22 — End: 2018-06-13

## 2017-11-22 MED ORDER — AMLODIPINE BESYLATE 5 MG PO TABS: 5.0000 mg | ORAL_TABLET | Freq: Every day | ORAL | 3 refills | Status: DC

## 2017-11-22 MED ORDER — METOPROLOL TARTRATE 25 MG PO TABS: ORAL_TABLET | ORAL | 3 refills | Status: DC

## 2017-11-22 MED ORDER — HYDROCHLOROTHIAZIDE 25 MG PO TABS: 25.0000 mg | ORAL_TABLET | Freq: Every day | ORAL | 3 refills | Status: DC

## 2017-11-22 MED ORDER — METFORMIN HCL ER 500 MG PO TB24: 500.0000 mg | ORAL_TABLET | Freq: Every day | ORAL | 3 refills | Status: DC

## 2017-11-22 NOTE — Assessment & Plan Note (Signed)
stable overall by history and exam, recent data reviewed with pt, and pt is able to reduce the metformin ER to 500 qd,  to f/u any worsening symptoms or concerns

## 2017-11-22 NOTE — Assessment & Plan Note (Signed)

## 2017-11-22 NOTE — Patient Outreach (Signed)
Incoming call from [facility] in response to the [facility] Medication Adherence Campaign.   Patient reports that she takes her lovastatin daily as directed. Patient denies missed doses or barriers to medication adherence. Reports that she has plenty of the medication at this time. Counsel on the importance of medication adherence. Patient denies any further medication questions/concerns at this time.  Harlow Asa, PharmD, [facility] [phone]

## 2017-11-22 NOTE — Progress Notes (Signed)
Subjective:    Patient ID: [name] [name], female    DOB: 13-Aug-1946, 71 y.o.   MRN: [id number]  HPI    Here for wellness and f/u;  Overall doing ok;  Pt denies Chest pain, worsening SOB, DOE, wheezing, orthopnea, PND, worsening LE edema, palpitations, dizziness or syncope.  Pt denies neurological change such as new headache, facial or extremity weakness.  Pt denies polydipsia, polyuria, or low sugar symptoms. Pt states overall good compliance with treatment and medications, good tolerability, and has been trying to follow appropriate diet.  Pt denies worsening depressive symptoms, suicidal ideation or panic. No fever, night sweats, wt loss, loss of appetite, or other constitutional symptoms.  Pt states good ability with ADL's, has low fall risk, home safety reviewed and adequate, no other significant changes in hearing or vision, and only occasionally active with exercise.  Due for podiatry f/u.   Past Medical History:  Diagnosis Date  . ARTHRITIS 01/30/2008  . ASTHMA 09/03/2007  . Cancer ([place]) 1987   palate cancer  . COLON CANCER 04/07/2008   pt states she had colon cancer 1987  . COPD 09/03/2007  . COPD (chronic obstructive pulmonary disease) ([place]) 04/22/2013  . CORONARY ARTERY DISEASE 09/03/2007  . DEGENERATIVE JOINT DISEASE 01/30/2008  . DIABETES MELLITUS 01/30/2008  . Diabetic peripheral neuropathy ([place]) 04/22/2013  . Eczema   . GERD 09/03/2007  . GLUCOSE INTOLERANCE 09/03/2007  . GOITER, MULTINODULAR 05/21/2010  . History of blood transfusion   . HYPERCHOLESTEROLEMIA 09/03/2007  . HYPERTENSION 09/03/2007  . Impaired glucose tolerance 04/10/2011  . LVH (left ventricular hypertrophy)   . OBESITY 09/03/2007  . Osteopenia   . Psoriasis   . Shortness of breath dyspnea    With exertion and allergies to pollen   Past Surgical History:  Procedure Laterality Date  . ABDOMINAL HYSTERECTOMY    . CARDIAC CATHETERIZATION    . COLON SURGERY    . FLOOR OF MOUTH BIOPSY N/A 12/24/2015   Procedure:  ORAL BIOPSY;  Surgeon: Izora Gala, MD;  Location: [facility];  Service: ENT;  Laterality: N/A;  . HEMICOLECTOMY  12/1985   cancer  . HERNIA REPAIR    . MAXILLECTOMY Right 01/22/2016   Procedure: RIGHT SIDE PARTIAL MAXILLECTOMY;  Surgeon: Izora Gala, MD;  Location: [facility];  Service: ENT;  Laterality: Right;  . TUBAL LIGATION      reports that she quit smoking about 38 years ago. Her smoking use included cigarettes. She has a 15.00 pack-year smoking history. she has never used smokeless tobacco. She reports that she does not drink alcohol or use drugs. family history includes Cancer in her other and sister; Colon cancer in her other and paternal aunt; Heart disease in her mother; Liver disease in her father; Thyroid disease in her brother and sister. Allergies  Allergen Reactions  . Simvastatin Other (See Comments)    Muscle spasms   Current Outpatient Medications on File Prior to Visit  Medication Sig Dispense Refill  . acyclovir cream (ZOVIRAX) 5 % Apply 1 application topically daily as needed. For psoriasis    . aspirin EC 81 MG tablet Take 1 tablet (81 mg total) by mouth daily. 90 tablet 11  . Blood Glucose Monitoring Suppl (ONE TOUCH ULTRA SYSTEM KIT) W/DEVICE KIT 1 kit by Does not apply route once. 250.02 1 each 0  . clobetasol (TEMOVATE) 0.05 % ointment Use twice once daily to hand and feet as needed (Patient taking differently: Apply 1 application topically 2 (two) times daily.  Use twice once daily to hand and feet as needed) 30 g 1  . fluocinolone (SYNALAR) 0.025 % ointment Apply topically 2 (two) times daily as needed. (Patient taking differently: Apply 1 application topically 2 (two) times daily as needed. For psoriasis) 30 g 1  . ketoconazole (NIZORAL) 2 % cream Apply topically 3 (three) times daily. use as directed two times a day as needed under breast 15 g 3  . loratadine-pseudoephedrine (CLARITIN-D 24-HOUR) 10-240 MG per 24 hr tablet Take 1 tablet by mouth daily.    . methimazole  (TAPAZOLE) 5 MG tablet Take 1 tablet (5 mg total) by mouth daily. 90 tablet 1  . ONE TOUCH ULTRA TEST test strip TEST ONCE DAILY. E11.9 100 each 1  . ONETOUCH DELICA LANCETS 38H MISC USE TO CHECK BLOOD SUGAR ONCE DAILY 200 each 0  . tiZANidine (ZANAFLEX) 4 MG tablet Take 1 tablet (4 mg total) by mouth every 6 (six) hours as needed for muscle spasms. 60 tablet 1  . traMADol (ULTRAM) 50 MG tablet TAKE 1 TABELT BY MOUTH EVERY 6 HOURS AS NEEDED FOR PAIN 120 tablet 1   No current facility-administered medications on file prior to visit.     Review of Systems Constitutional: Negative for other unusual diaphoresis, sweats, appetite or weight changes HENT: Negative for other worsening hearing loss, ear pain, facial swelling, mouth sores or neck stiffness.   Eyes: Negative for other worsening pain, redness or other visual disturbance.  Respiratory: Negative for other stridor or swelling Cardiovascular: Negative for other palpitations or other chest pain  Gastrointestinal: Negative for worsening diarrhea or loose stools, blood in stool, distention or other pain Genitourinary: Negative for hematuria, flank pain or other change in urine volume.  Musculoskeletal: Negative for myalgias or other joint swelling.  Skin: Negative for other color change, or other wound or worsening drainage.  Neurological: Negative for other syncope or numbness. Hematological: Negative for other adenopathy or swelling Psychiatric/Behavioral: Negative for hallucinations, other worsening agitation, SI, self-injury, or new decreased concentration All other system neg per pt    Objective:   Physical Exam BP 118/72   Pulse 67   Temp 98.4 F (36.9 C) (Oral)   Ht 5' 1" (1.549 m)   Wt 131 lb (59.4 kg)   SpO2 97%   BMI 24.75 kg/m  VS noted,  Constitutional: Pt is oriented to person, place, and time. Appears well-developed and well-nourished, in no significant distress and comfortable Head: Normocephalic and atraumatic    Eyes: Conjunctivae and EOM are normal. Pupils are equal, round, and reactive to light Right Ear: External ear normal without discharge Left Ear: External ear normal without discharge Nose: Nose without discharge or deformity Mouth/Throat: Oropharynx is without other ulcerations and moist  Neck: Normal range of motion. Neck supple. No JVD present. No tracheal deviation present or significant neck LA or mass Cardiovascular: Normal rate, regular rhythm, normal heart sounds and intact distal pulses.   Pulmonary/Chest: WOB normal and breath sounds without rales or wheezing  Abdominal: Soft. Bowel sounds are normal. NT. No HSM  Musculoskeletal: Normal range of motion. Exhibits no edema Lymphadenopathy: Has no other cervical adenopathy.  Neurological: Pt is alert and oriented to person, place, and time. Pt has normal reflexes. No cranial nerve deficit. Motor grossly intact, Gait intact Skin: Skin is warm and dry. No rash noted or new ulcerations Psychiatric:  Has normal mood and affect. Behavior is normal without agitation No other exam findings Lab Results  Component Value Date  WBC 6.6 11/13/2017   HGB 11.9 (L) 11/13/2017   HCT 37.4 11/13/2017   PLT 223.0 11/13/2017   GLUCOSE 78 11/13/2017   CHOL 154 11/13/2017   TRIG 61.0 11/13/2017   HDL 63.50 11/13/2017   LDLCALC 79 11/13/2017   ALT 17 11/13/2017   AST 23 11/13/2017   NA 142 11/13/2017   K 3.7 11/13/2017   CL 103 11/13/2017   CREATININE 0.77 11/13/2017   BUN 16 11/13/2017   CO2 30 11/13/2017   TSH 0.41 11/13/2017   INR 1.04 02/08/2016   HGBA1C 6.0 11/13/2017   MICROALBUR <0.7 11/13/2017      Assessment & Plan:   

## 2017-11-22 NOTE — Patient Instructions (Addendum)
You had the Tdap tetanus shot today  Ok to decrease the metformiin ER 500 mg to 1 pill in the AM  Please continue all other medications as before, and refills have been done if requested.  Please have the pharmacy call with any other refills you may need.  Please continue your efforts at being more active, low cholesterol diet, and weight control.  You are otherwise up to date with prevention measures today.  Please keep your appointments with your specialists as you may have planned  Please return in 6 months, or sooner if needed, with Lab testing done 3-5 days before

## 2017-12-06 DIAGNOSIS — I517 Cardiomegaly: Secondary | ICD-10-CM | POA: Diagnosis not present

## 2017-12-06 DIAGNOSIS — S0990XA Unspecified injury of head, initial encounter: Secondary | ICD-10-CM | POA: Diagnosis not present

## 2017-12-06 DIAGNOSIS — S20211A Contusion of right front wall of thorax, initial encounter: Secondary | ICD-10-CM | POA: Diagnosis not present

## 2017-12-06 DIAGNOSIS — M546 Pain in thoracic spine: Secondary | ICD-10-CM | POA: Diagnosis not present

## 2017-12-06 DIAGNOSIS — S199XXA Unspecified injury of neck, initial encounter: Secondary | ICD-10-CM | POA: Diagnosis not present

## 2017-12-06 DIAGNOSIS — S161XXA Strain of muscle, fascia and tendon at neck level, initial encounter: Secondary | ICD-10-CM | POA: Diagnosis not present

## 2017-12-06 DIAGNOSIS — R079 Chest pain, unspecified: Secondary | ICD-10-CM | POA: Diagnosis not present

## 2017-12-06 DIAGNOSIS — M542 Cervicalgia: Secondary | ICD-10-CM | POA: Diagnosis not present

## 2017-12-06 DIAGNOSIS — Y92410 Unspecified street and highway as the place of occurrence of the external cause: Secondary | ICD-10-CM | POA: Diagnosis not present

## 2017-12-06 DIAGNOSIS — S20212A Contusion of left front wall of thorax, initial encounter: Secondary | ICD-10-CM | POA: Diagnosis not present

## 2017-12-18 ENCOUNTER — Other Ambulatory Visit: Payer: Self-pay | Admitting: Internal Medicine

## 2017-12-19 DIAGNOSIS — Z85819 Personal history of malignant neoplasm of unspecified site of lip, oral cavity, and pharynx: Secondary | ICD-10-CM | POA: Diagnosis not present

## 2017-12-19 DIAGNOSIS — Z9889 Other specified postprocedural states: Secondary | ICD-10-CM | POA: Diagnosis not present

## 2017-12-27 ENCOUNTER — Ambulatory Visit: Payer: Self-pay | Admitting: *Deleted
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
## 2017-12-28 ENCOUNTER — Encounter: Payer: Self-pay | Admitting: Family Medicine

## 2017-12-28 ENCOUNTER — Ambulatory Visit (INDEPENDENT_AMBULATORY_CARE_PROVIDER_SITE_OTHER): Payer: Medicare Other | Admitting: Family Medicine

## 2017-12-28 VITALS — BP 118/68 | HR 67 | Temp 98.3°F | Ht 61.0 in | Wt 127.0 lb

## 2017-12-28 DIAGNOSIS — M546 Pain in thoracic spine: Secondary | ICD-10-CM | POA: Diagnosis not present
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
## 2017-12-28 NOTE — Patient Instructions (Signed)
I have placed a referral to physical therapy.  You can try applying Aspercreme with lidocaine to the area. You can try ice or heat

## 2017-12-28 NOTE — Assessment & Plan Note (Signed)
Pain is likely muscular in nature. No signs of any deficiencies. - Referral to physical therapy - If no improvement consider trigger point injections

## 2018-01-02 ENCOUNTER — Inpatient Hospital Stay: Payer: Medicare Other | Admitting: Internal Medicine

## 2018-01-05 ENCOUNTER — Other Ambulatory Visit: Payer: Self-pay

## 2018-01-05 ENCOUNTER — Ambulatory Visit: Payer: Medicare Other | Attending: Family Medicine | Admitting: Physical Therapy

## 2018-01-05 ENCOUNTER — Encounter: Payer: Self-pay | Admitting: Physical Therapy

## 2018-01-05 DIAGNOSIS — M6289 Other specified disorders of muscle: Secondary | ICD-10-CM | POA: Insufficient documentation

## 2018-01-05 DIAGNOSIS — M546 Pain in thoracic spine: Secondary | ICD-10-CM | POA: Diagnosis not present

## 2018-01-05 DIAGNOSIS — M542 Cervicalgia: Secondary | ICD-10-CM | POA: Diagnosis not present
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
## 2018-01-10 ENCOUNTER — Ambulatory Visit: Payer: Medicare Other | Admitting: Physical Therapy

## 2018-01-10 DIAGNOSIS — M546 Pain in thoracic spine: Secondary | ICD-10-CM

## 2018-01-10 DIAGNOSIS — M542 Cervicalgia: Secondary | ICD-10-CM | POA: Diagnosis not present

## 2018-01-10 DIAGNOSIS — M6289 Other specified disorders of muscle: Secondary | ICD-10-CM | POA: Diagnosis not present

## 2018-01-10 NOTE — Therapy (Signed)
[place], [place], [address] Phone: [phone]   Fax:  [phone]  Physical Therapy Treatment  Patient Details  Name: [name] [name] MRN: [id number] Date of Birth: 10-14-1946 Referring Provider: Raeford Razor   Encounter Date: 01/10/2018  PT End of Session - 01/10/18 1406    Visit Number  2    Date for PT Re-Evaluation  03/05/18    PT Start Time  1401    PT Stop Time  1440    PT Time Calculation (min)  39 min    Activity Tolerance  Patient tolerated treatment well    Behavior During Therapy  [facility] for tasks assessed/performed       Past Medical History:  Diagnosis Date  . ARTHRITIS 01/30/2008  . ASTHMA 09/03/2007  . 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 COLON CANCER 04/07/2008   pt states she had colon cancer 1987  . COPD 09/03/2007  . COPD (chronic obstructive pulmonary disease) ([place]) 04/22/2013  . CORONARY ARTERY DISEASE 09/03/2007  . DEGENERATIVE JOINT DISEASE 01/30/2008  . DIABETES MELLITUS 01/30/2008  . Diabetic peripheral neuropathy ([place]) 04/22/2013  . Eczema   . GERD 09/03/2007  . GLUCOSE INTOLERANCE 09/03/2007  . GOITER, MULTINODULAR 05/21/2010  . History of blood transfusion   . HYPERCHOLESTEROLEMIA 09/03/2007  . HYPERTENSION 09/03/2007  . Impaired glucose tolerance 04/10/2011  . LVH (left ventricular hypertrophy)   . OBESITY 09/03/2007  . Osteopenia   . Psoriasis   . Shortness of breath dyspnea    With exertion and allergies to pollen    Past Surgical History:  Procedure Laterality Date  . ABDOMINAL HYSTERECTOMY    . CARDIAC CATHETERIZATION    . COLON SURGERY    . FLOOR OF MOUTH BIOPSY N/A 12/24/2015   Procedure: ORAL BIOPSY;  Surgeon: Izora Gala, MD;  Location: [place];  Service: ENT;  Laterality: N/A;  . HEMICOLECTOMY  12/1985   cancer  . HERNIA REPAIR    . MAXILLECTOMY Right 01/22/2016   Procedure: RIGHT SIDE PARTIAL MAXILLECTOMY;  Surgeon: Izora Gala, MD;  Location: [place];   Service: ENT;  Laterality: Right;  . TUBAL LIGATION      There were no vitals filed for this visit.  Subjective Assessment - 01/10/18 1405    Subjective  Pt. reporting her neck has been feeling better recently.  Performing HEP stretches daily.  Feels neck has been primary concern over this past week.      Patient Stated Goals  to figure out why pain comes and goes and prevent it from coming back, no pain that prevents from singing in choir or solo performances    Currently in Pain?  No/denies    Pain Score  0-No pain    Multiple Pain Sites  No         [facility] PT Assessment - 01/10/18 1434      AROM   AROM Assessment Site  Shoulder    Right/Left Shoulder  Right;Left    Right Shoulder Flexion  143 Degrees    Right Shoulder ABduction  149 Degrees stiffness    Right Shoulder Internal Rotation  -- FIR T5    Right Shoulder External Rotation  -- FER to T5    Left Shoulder Flexion  149 Degrees    Left Shoulder ABduction  151 Degrees stiffness     Left Shoulder Internal Rotation  -- FIR to T5    Left Shoulder External Rotation  --  FER to T3                  [facility] Adult PT Treatment/Exercise - 01/10/18 1418      Exercises   Exercises  Lumbar      Neck Exercises: Seated   Neck Retraction  10 reps;3 secs    Neck Retraction Limitations  some cueing required for proper technique     Cervical Rotation  10 reps    Cervical Rotation Limitations  5" hold       Lumbar Exercises: Aerobic   Nustep  Lvl 3, 6 min       Lumbar Exercises: Standing   Row  Both;10 reps    Theraband Level (Row)  Level 2 (Red)      Lumbar Exercises: Sidelying   Other Sidelying Lumbar Exercises  Sidelying open book stretch 5" x 10 reps       Modalities   Modalities  Moist Heat      Moist Heat Therapy   Number Minutes Moist Heat  10 Minutes    Moist Heat Location  Cervical      Manual Therapy   Manual Therapy  Soft tissue mobilization    Manual therapy comments  Supine     Soft tissue  mobilization  STM to B UT, levator scap      Neck Exercises: Stretches   Upper Trapezius Stretch  Right;Left;2 reps;30 seconds    Upper Trapezius Stretch Limitations  B     Levator Stretch  Right;Left;2 reps;30 seconds    Levator Stretch Limitations  B                PT Short Term Goals - 01/10/18 1406      PT SHORT TERM GOAL #1   Title  independent with initial HEP    Time  2    Period  Weeks    Status  On-going        PT Long Term Goals - 01/10/18 1406      PT LONG TERM GOAL #1   Title  increase cervical AROM 50% for functional use    Time  8    Period  Weeks    Status  On-going      PT LONG TERM GOAL #2   Title  report no pain with ADLs for functional use    Time  8    Period  Weeks    Status  On-going      PT LONG TERM GOAL #3   Title  reports singing in choir or doing solo performances with no pain or limitation     Time  8    Period  Weeks    Status  On-going            Plan - 01/10/18 1406    Clinical Impression Statement  Tracyann doing well today and feels neck pain has been primary concern over this past week as compared to the back.  Treatment focusing on gentle cervical/thoracic stretching and strengthening with pt. tolerating well.  Some "stiffness" in neck to end treatment thus ended with moist heat to cervical spine with good relief noted.  Will continue to progress toward goals in coming visits.       PT Treatment/Interventions  Electrical Stimulation;Moist Heat;Therapeutic activities;Therapeutic exercise;Patient/family education;Manual techniques    PT Home Exercise Plan  UT and levator stretch    Consulted and Agree with Plan of Care  Patient       Patient  will benefit from skilled therapeutic intervention in order to improve the following deficits and impairments:  Pain, Increased muscle spasms, Impaired tone, Decreased strength, Decreased range of motion  Visit Diagnosis: Acute right-sided thoracic back pain  Neck pain  Muscle  tightness  Muscle tone increased     Problem List Patient Active Problem List   Diagnosis Date Noted  . Acute right-sided thoracic back pain 12/28/2017  . Coronary artery calcification seen on CT scan 02/22/2017  . Microhematuria 11/10/2016  . Asthmatic bronchitis 11/10/2016  . Oral cancer ([place]) 01/22/2016  . Pulmonary nodule 01/15/2016  . Hyperthyroidism 05/07/2015  . Loss of weight 05/05/2015  . Appetite impaired 05/05/2015  . General weakness 05/05/2015  . UTI (urinary tract infection) 05/05/2015  . Hypokalemia 05/05/2015  . Abnormal TSH 05/05/2015  . Lower back pain 10/31/2014  . Dysuria 11/14/2013  . Diabetic peripheral neuropathy ([place]) 04/22/2013  . Hypersomnolence 06/14/2012  . Eczema 10/19/2011  . Preventative health care 04/10/2011  . GOITER, MULTINODULAR 05/21/2010  . OSTEOPENIA 04/07/2008  . COLON CANCER 04/07/2008  . Diabetes mellitus with neuropathy ([place]) 01/30/2008  . DEGENERATIVE JOINT DISEASE 01/30/2008  . ARTHRITIS 01/30/2008  . HYPERCHOLESTEROLEMIA 09/03/2007  . OBESITY 09/03/2007  . Essential hypertension 09/03/2007  . CORONARY ARTERY DISEASE 09/03/2007  . Allergic rhinitis 09/03/2007  . Asthma 09/03/2007  . GERD 09/03/2007  . Palpitations 09/03/2007    Bess Harvest, PTA 01/10/18 3:43 PM  [place], [place], [address] Phone: [phone]   Fax:  [phone]  Name: JALYNNE PERSICO MRN: [id number] Date of Birth: Feb 11, 1946

## 2018-01-12 ENCOUNTER — Encounter: Payer: Self-pay | Admitting: Pulmonary Disease

## 2018-01-12 ENCOUNTER — Ambulatory Visit (INDEPENDENT_AMBULATORY_CARE_PROVIDER_SITE_OTHER): Payer: Medicare Other | Admitting: Pulmonary Disease

## 2018-01-12 DIAGNOSIS — J4530 Mild persistent asthma, uncomplicated: Secondary | ICD-10-CM

## 2018-01-12 MED ORDER — BUDESONIDE-FORMOTEROL FUMARATE 80-4.5 MCG/ACT IN AERO: 2.0000 | INHALATION_SPRAY | Freq: Two times a day (BID) | RESPIRATORY_TRACT | 4 refills | Status: DC

## 2018-01-12 NOTE — Progress Notes (Signed)
   Subjective:    Patient ID: [name] [name], female    DOB: 1946/05/17, 72 y.o.   MRN: [id number]  HPI  72 yo AAF, ex smoker with asthmatic bronchitis, GERD and a component of VCD.  Worse during spring, fall & extreme heat. On xopenex MDI due to after taste & palpitations with albuterol    she used to be on Advair but then switched to Symbicort and has been well controlled on this.  she uses Symbicort  Only once daily and has rarely needed rescue albuterol  She denies nocturnal wheezing.  She denies postnasal drip or heartburn She underwent surgery for mouth cancer by Dr. Constance Holster in 2017 and this has not relapsed  Significant tests/ events reviewed 02/2017 88mm RLL nodule, stable since 2017, benign  12/2012 Spirometry >> FEv1 84%   Review of Systems neg for any significant sore throat, dysphagia, itching, sneezing, nasal congestion or excess/ purulent secretions, fever, chills, sweats, unintended wt loss, pleuritic or exertional cp, hempoptysis, orthopnea pnd or change in chronic leg swelling. Also denies presyncope, palpitations, heartburn, abdominal pain, nausea, vomiting, diarrhea or change in bowel or urinary habits, dysuria,hematuria, rash, arthralgias, visual complaints, headache, numbness weakness or ataxia.     Objective:   Physical Exam   Gen. Pleasant, well-nourished, in no distress ENT - no thrush, no post nasal drip Neck: No JVD, no thyromegaly, no carotid bruits Lungs: no use of accessory muscles, no dullness to percussion, clear without rales or rhonchi  Cardiovascular: Rhythm regular, heart sounds  normal, no murmurs or gallops, no peripheral edema Musculoskeletal: No deformities, no cyanosis or clubbing         Assessment & Plan:

## 2018-01-12 NOTE — Patient Instructions (Signed)
Decrease Symbicort to 80/4.5 - 2 puffs twice daily  Call us as needed

## 2018-01-12 NOTE — Addendum Note (Signed)
Addended by: Valerie Salts on: 01/12/2018 10:17 AM   Modules accepted: Orders

## 2018-01-12 NOTE — Assessment & Plan Note (Signed)
Decrease Symbicort to 80/4.5 - 2 puffs twice daily Use albuterol only as needed Overall much improved and will consider stepdown further in the future

## 2018-01-16 ENCOUNTER — Ambulatory Visit: Payer: Medicare Other | Attending: Family Medicine | Admitting: Physical Therapy

## 2018-01-16 ENCOUNTER — Encounter: Payer: Self-pay | Admitting: Physical Therapy

## 2018-01-16 DIAGNOSIS — M6289 Other specified disorders of muscle: Secondary | ICD-10-CM | POA: Diagnosis not present

## 2018-01-16 DIAGNOSIS — M542 Cervicalgia: Secondary | ICD-10-CM | POA: Diagnosis not present

## 2018-01-16 DIAGNOSIS — M546 Pain in thoracic spine: Secondary | ICD-10-CM | POA: Diagnosis not present

## 2018-01-16 NOTE — Therapy (Signed)
[place], [place], [address] Phone: [phone]   Fax:  [phone]  Physical Therapy Treatment  Patient Details  Name: [name] [name] MRN: [id number] Date of Birth: [birth year] Referring Provider: Raeford Razor   Encounter Date: 01/16/2018  PT End of Session - 01/16/18 1031    Visit Number  3    Date for PT Re-Evaluation  03/05/18    PT Start Time  1000    PT Stop Time  1048    PT Time Calculation (min)  48 min       Past Medical History:  Diagnosis Date  . ARTHRITIS 01/30/2008  . ASTHMA 09/03/2007  . Cancer ([place]) 1987   palate cancer  . COLON CANCER 04/07/2008   pt states she had colon cancer 1987  . COPD 09/03/2007  . COPD (chronic obstructive pulmonary disease) ([place]) 04/22/2013  . CORONARY ARTERY DISEASE 09/03/2007  . DEGENERATIVE JOINT DISEASE 01/30/2008  . DIABETES MELLITUS 01/30/2008  . Diabetic peripheral neuropathy ([place]) 04/22/2013  . Eczema   . GERD 09/03/2007  . GLUCOSE INTOLERANCE 09/03/2007  . GOITER, MULTINODULAR 05/21/2010  . History of blood transfusion   . HYPERCHOLESTEROLEMIA 09/03/2007  . HYPERTENSION 09/03/2007  . Impaired glucose tolerance 04/10/2011  . LVH (left ventricular hypertrophy)   . OBESITY 09/03/2007  . Osteopenia   . Psoriasis   . Shortness of breath dyspnea    With exertion and allergies to pollen    Past Surgical History:  Procedure Laterality Date  . ABDOMINAL HYSTERECTOMY    . CARDIAC CATHETERIZATION    . COLON SURGERY    . FLOOR OF MOUTH BIOPSY N/A 12/24/2015   Procedure: ORAL BIOPSY;  Surgeon: Izora Gala, MD;  Location: [place];  Service: ENT;  Laterality: N/A;  . HEMICOLECTOMY  12/1985   cancer  . HERNIA REPAIR    . MAXILLECTOMY Right 01/22/2016   Procedure: RIGHT SIDE PARTIAL MAXILLECTOMY;  Surgeon: Izora Gala, MD;  Location: [place];  Service: ENT;  Laterality: Right;  . TUBAL LIGATION      There were no vitals filed for this visit.  Subjective  Assessment - 01/16/18 1003    Subjective  doing better, pinch in neck only 1 x since last session. stiffness.back acts up now and again. 50% better    Currently in Pain?  Yes    Pain Score  4     Pain Location  Back         [facility] PT Assessment - 01/16/18 0001      AROM   Overall AROM Comments  cerv ROM WFLS except rotaion decreased 25%    Right Shoulder Flexion  167 Degrees    Right Shoulder ABduction  169 Degrees    Left Shoulder Flexion  166 Degrees    Left Shoulder ABduction  172 Degrees                  [facility] Adult PT Treatment/Exercise - 01/16/18 0001      Neck Exercises: Seated   Other Seated Exercise  UBE L 3 35fd/2 back    Other Seated Exercise  seated row and lat 15# 2 sets 10      Lumbar Exercises: Aerobic   Nustep  Lvl 4, 5 min       Moist Heat Therapy   Number Minutes Moist Heat  10 Minutes    Moist Heat Location  Cervical;Lumbar Spine  PT Education - 01/16/18 1015    Education provided  Yes    Education Details  red tband scap stab and cerv retraction    Person(s) Educated  Patient    Methods  Explanation;Demonstration;Handout    Comprehension  Verbalized understanding;Returned demonstration       PT Short Term Goals - 01/16/18 1014      PT SHORT TERM GOAL #1   Title  independent with initial HEP    Status  Achieved        PT Long Term Goals - 01/16/18 1014      PT LONG TERM GOAL #1   Title  increase cervical AROM 50% for functional use    Status  Achieved      PT LONG TERM GOAL #2   Title  report no pain with ADLs for functional use    Status  Partially Met      PT LONG TERM GOAL #3   Title  reports singing in choir or doing solo performances with no pain or limitation     Status  Not Met            Plan - 01/16/18 1032    Clinical Impression Statement  STG met and progressing woth LTG. excellent increase in ROM cerv and shld. cuing needed with added ex included HEP issued but tolerated all well with  cuing    PT Treatment/Interventions  Electrical Stimulation;Moist Heat;Therapeutic activities;Therapeutic exercise;Patient/family education;Manual techniques    PT Next Visit Plan  check HEP and progress       Patient will benefit from skilled therapeutic intervention in order to improve the following deficits and impairments:  Pain, Increased muscle spasms, Impaired tone, Decreased strength, Decreased range of motion  Visit Diagnosis: Acute right-sided thoracic back pain  Neck pain  Muscle tightness  Muscle tone increased     Problem List Patient Active Problem List   Diagnosis Date Noted  . Acute right-sided thoracic back pain 12/28/2017  . Coronary artery calcification seen on CT scan 02/22/2017  . Microhematuria 11/10/2016  . Asthmatic bronchitis 11/10/2016  . Oral cancer ([place]) 01/22/2016  . Pulmonary nodule 01/15/2016  . Hyperthyroidism 05/07/2015  . Loss of weight 05/05/2015  . Appetite impaired 05/05/2015  . General weakness 05/05/2015  . UTI (urinary tract infection) 05/05/2015  . Hypokalemia 05/05/2015  . Abnormal TSH 05/05/2015  . Lower back pain 10/31/2014  . Dysuria 11/14/2013  . Diabetic peripheral neuropathy ([place]) 04/22/2013  . Hypersomnolence 06/14/2012  . Eczema 10/19/2011  . Preventative health care 04/10/2011  . GOITER, MULTINODULAR 05/21/2010  . OSTEOPENIA 04/07/2008  . COLON CANCER 04/07/2008  . Diabetes mellitus with neuropathy ([place]) 01/30/2008  . DEGENERATIVE JOINT DISEASE 01/30/2008  . ARTHRITIS 01/30/2008  . HYPERCHOLESTEROLEMIA 09/03/2007  . OBESITY 09/03/2007  . Essential hypertension 09/03/2007  . CORONARY ARTERY DISEASE 09/03/2007  . Allergic rhinitis 09/03/2007  . Asthma 09/03/2007  . GERD 09/03/2007  . Palpitations 09/03/2007    PAYSEUR,ANGIE PTA 01/16/2018, 10:36 AM  [place], [place], [address] Phone: [phone]   Fax:   [phone]  Name: [name] [name] MRN: [id number] Date of Birth: 01/24/1946

## 2018-01-18 ENCOUNTER — Ambulatory Visit: Payer: Medicare Other | Admitting: Physical Therapy

## 2018-01-18 ENCOUNTER — Encounter: Payer: Self-pay | Admitting: Physical Therapy

## 2018-01-18 DIAGNOSIS — M6289 Other specified disorders of muscle: Secondary | ICD-10-CM

## 2018-01-18 DIAGNOSIS — M546 Pain in thoracic spine: Secondary | ICD-10-CM

## 2018-01-18 DIAGNOSIS — M542 Cervicalgia: Secondary | ICD-10-CM | POA: Diagnosis not present

## 2018-01-18 NOTE — Therapy (Signed)
[place], [place], [address] Phone: [phone]   Fax:  [phone]  Physical Therapy Treatment  Patient Details  Name: [name] [name] MRN: [id number] Date of Birth: 05/25/1946 Referring Provider: Raeford Razor   Encounter Date: 01/18/2018  PT End of Session - 01/18/18 1029    Visit Number  4    Date for PT Re-Evaluation  03/05/18    PT Start Time  1004    PT Stop Time  1055    PT Time Calculation (min)  51 min       Past Medical History:  Diagnosis Date  . ARTHRITIS 01/30/2008  . ASTHMA 09/03/2007  . Cancer ([place]) 1987   palate cancer  . COLON CANCER 04/07/2008   pt states she had colon cancer 1987  . COPD 09/03/2007  . COPD (chronic obstructive pulmonary disease) ([place]) 04/22/2013  . CORONARY ARTERY DISEASE 09/03/2007  . DEGENERATIVE JOINT DISEASE 01/30/2008  . DIABETES MELLITUS 01/30/2008  . Diabetic peripheral neuropathy ([place]) 04/22/2013  . Eczema   . GERD 09/03/2007  . GLUCOSE INTOLERANCE 09/03/2007  . GOITER, MULTINODULAR 05/21/2010  . History of blood transfusion   . HYPERCHOLESTEROLEMIA 09/03/2007  . HYPERTENSION 09/03/2007  . Impaired glucose tolerance 04/10/2011  . LVH (left ventricular hypertrophy)   . OBESITY 09/03/2007  . Osteopenia   . Psoriasis   . Shortness of breath dyspnea    With exertion and allergies to pollen    Past Surgical History:  Procedure Laterality Date  . ABDOMINAL HYSTERECTOMY    . CARDIAC CATHETERIZATION    . COLON SURGERY    . FLOOR OF MOUTH BIOPSY N/A 12/24/2015   Procedure: ORAL BIOPSY;  Surgeon: Izora Gala, MD;  Location: [place];  Service: ENT;  Laterality: N/A;  . HEMICOLECTOMY  12/1985   cancer  . HERNIA REPAIR    . MAXILLECTOMY Right 01/22/2016   Procedure: RIGHT SIDE PARTIAL MAXILLECTOMY;  Surgeon: Izora Gala, MD;  Location: [place];  Service: ENT;  Laterality: Right;  . TUBAL LIGATION      There were no vitals filed for this visit.  Subjective  Assessment - 01/18/18 1005    Subjective  stiffness but no pain, no pinching/sharp pain in a week. doing HEP without issue    Currently in Pain?  Yes    Pain Score  2     Pain Location  Back                      [facility] Adult PT Treatment/Exercise - 01/18/18 0001      Neck Exercises: Standing   Neck Retraction  15 reps ball on wall    Wall Push Ups  15 reps with ball    Upper Extremity D1  Weights;20 reps    UE D1 Weights (lbs)  2    Other Standing Exercises  5# shruggs and backward rolls 15 each    Other Standing Exercises  pulley row 2 sets 10 10#      Neck Exercises: Seated   Other Seated Exercise  seated row and lat 15# 2 sets 10      Lumbar Exercises: Aerobic   Nustep  L 5 6 min      Lumbar Exercises: Machines for Strengthening   Cybex Lumbar Extension  green tband 2 sets 10      Moist Heat Therapy   Number Minutes Moist Heat  10 Minutes    Moist Heat Location  Cervical;Lumbar Spine               PT Short Term Goals - 01/16/18 1014      PT SHORT TERM GOAL #1   Title  independent with initial HEP    Status  Achieved        PT Long Term Goals - 01/16/18 1014      PT LONG TERM GOAL #1   Title  increase cervical AROM 50% for functional use    Status  Achieved      PT LONG TERM GOAL #2   Title  report no pain with ADLs for functional use    Status  Partially Met      PT LONG TERM GOAL #3   Title  reports singing in choir or doing solo performances with no pain or limitation     Status  Not Met            Plan - 01/18/18 1038    Clinical Impression Statement  pt independant with HEP and tol increase in ex well, some postural cuing needed and fatigued noted. Tenderness BIL trap and rhom but left more so dispite pain on RT.    PT Treatment/Interventions  Electrical Stimulation;Moist Heat;Therapeutic activities;Therapeutic exercise;Patient/family education;Manual techniques    PT Next Visit Plan  ROM and strength       Patient will  benefit from skilled therapeutic intervention in order to improve the following deficits and impairments:  Pain, Increased muscle spasms, Impaired tone, Decreased strength, Decreased range of motion  Visit Diagnosis: Acute right-sided thoracic back pain  Neck pain  Muscle tightness  Muscle tone increased     Problem List Patient Active Problem List   Diagnosis Date Noted  . Acute right-sided thoracic back pain 12/28/2017  . Coronary artery calcification seen on CT scan 02/22/2017  . Microhematuria 11/10/2016  . Asthmatic bronchitis 11/10/2016  . Oral cancer ([place]) 01/22/2016  . Pulmonary nodule 01/15/2016  . Hyperthyroidism 05/07/2015  . Loss of weight 05/05/2015  . Appetite impaired 05/05/2015  . General weakness 05/05/2015  . UTI (urinary tract infection) 05/05/2015  . Hypokalemia 05/05/2015  . Abnormal TSH 05/05/2015  . Lower back pain 10/31/2014  . Dysuria 11/14/2013  . Diabetic peripheral neuropathy ([place]) 04/22/2013  . Hypersomnolence 06/14/2012  . Eczema 10/19/2011  . Preventative health care 04/10/2011  . GOITER, MULTINODULAR 05/21/2010  . OSTEOPENIA 04/07/2008  . COLON CANCER 04/07/2008  . Diabetes mellitus with neuropathy ([place]) 01/30/2008  . DEGENERATIVE JOINT DISEASE 01/30/2008  . ARTHRITIS 01/30/2008  . HYPERCHOLESTEROLEMIA 09/03/2007  . OBESITY 09/03/2007  . Essential hypertension 09/03/2007  . CORONARY ARTERY DISEASE 09/03/2007  . Allergic rhinitis 09/03/2007  . Asthma 09/03/2007  . GERD 09/03/2007  . Palpitations 09/03/2007    Ommie Degeorge,ANGIE  PTA 01/18/2018, 10:39 AM  [place], [place], [address] Phone: [phone]   Fax:  [phone]  Name: BRIELYNN SEKULA MRN: [id number] Date of Birth: [birth year]

## 2018-01-23 ENCOUNTER — Ambulatory Visit: Payer: Medicare Other | Admitting: Physical Therapy

## 2018-01-23 ENCOUNTER — Encounter: Payer: Self-pay | Admitting: Physical Therapy

## 2018-01-23 ENCOUNTER — Encounter: Payer: Medicare Other | Admitting: Physical Therapy

## 2018-01-23 DIAGNOSIS — M6289 Other specified disorders of muscle: Secondary | ICD-10-CM | POA: Diagnosis not present

## 2018-01-23 DIAGNOSIS — M546 Pain in thoracic spine: Secondary | ICD-10-CM

## 2018-01-23 DIAGNOSIS — M542 Cervicalgia: Secondary | ICD-10-CM | POA: Diagnosis not present

## 2018-01-23 NOTE — Therapy (Signed)
[place], [place], [address] Phone: [phone]   Fax:  [phone]  Physical Therapy Treatment  Patient Details  Name: [name] [name] MRN: [id number] Date of Birth: 03-09-1946 Referring Provider: Raeford Razor   Encounter Date: 01/23/2018  PT End of Session - 01/23/18 1421    Visit Number  5    Date for PT Re-Evaluation  03/05/18    PT Start Time  1340    PT Stop Time  1431    PT Time Calculation (min)  51 min    Activity Tolerance  Patient tolerated treatment well    Behavior During Therapy  [facility] for tasks assessed/performed       Past Medical History:  Diagnosis Date  . ARTHRITIS 01/30/2008  . ASTHMA 09/03/2007  . Cancer ([place]) 1987   palate cancer  . COLON CANCER 04/07/2008   pt states she had colon cancer 1987  . COPD 09/03/2007  . COPD (chronic obstructive pulmonary disease) ([place]) 04/22/2013  . CORONARY ARTERY DISEASE 09/03/2007  . DEGENERATIVE JOINT DISEASE 01/30/2008  . DIABETES MELLITUS 01/30/2008  . Diabetic peripheral neuropathy ([place]) 04/22/2013  . Eczema   . GERD 09/03/2007  . GLUCOSE INTOLERANCE 09/03/2007  . GOITER, MULTINODULAR 05/21/2010  . History of blood transfusion   . HYPERCHOLESTEROLEMIA 09/03/2007  . HYPERTENSION 09/03/2007  . Impaired glucose tolerance 04/10/2011  . LVH (left ventricular hypertrophy)   . OBESITY 09/03/2007  . Osteopenia   . Psoriasis   . Shortness of breath dyspnea    With exertion and allergies to pollen    Past Surgical History:  Procedure Laterality Date  . ABDOMINAL HYSTERECTOMY    . CARDIAC CATHETERIZATION    . COLON SURGERY    . FLOOR OF MOUTH BIOPSY N/A 12/24/2015   Procedure: ORAL BIOPSY;  Surgeon: Izora Gala, MD;  Location: [place] [facility];  Service: ENT;  Laterality: N/A;  . HEMICOLECTOMY  12/1985   cancer  . HERNIA REPAIR    . MAXILLECTOMY Right 01/22/2016   Procedure: RIGHT SIDE PARTIAL MAXILLECTOMY;  Surgeon: Izora Gala, MD;  Location: [facility];   Service: ENT;  Laterality: Right;  . TUBAL LIGATION      There were no vitals filed for this visit.  Subjective Assessment - 01/23/18 1340    Subjective  Pt reports that she has some pain yesterday but feels better today. Reports being sore all over yesterday,     Currently in Pain?  Yes    Pain Score  1     Pain Location  Back                      [facility] Adult PT Treatment/Exercise - 01/23/18 0001      Neck Exercises: Standing   Neck Retraction  10 reps bsll on wall     Wall Push Ups  15 reps ball on wall    Other Standing Exercises  5# shruggs and backward rolls 15 each      Neck Exercises: Seated   Other Seated Exercise  seated row and lat 15# 2 sets 10      Lumbar Exercises: Aerobic   Nustep  L 5 6 min      Lumbar Exercises: Machines for Strengthening   Cybex Lumbar Extension  green tband 2 sets 10    Cybex Knee Flexion  20lb 2x10       Lumbar Exercises: Standing   Row  Both;20 reps;Theraband  Theraband Level (Row)  Level 2 (Red)    Shoulder Extension  Both;20 reps;Theraband    Theraband Level (Shoulder Extension)  Level 2 (Red)      Moist Heat Therapy   Number Minutes Moist Heat  10 Minutes    Moist Heat Location  Cervical;Lumbar Spine               PT Short Term Goals - 01/16/18 1014      PT SHORT TERM GOAL #1   Title  independent with initial HEP    Status  Achieved        PT Long Term Goals - 01/16/18 1014      PT LONG TERM GOAL #1   Title  increase cervical AROM 50% for functional use    Status  Achieved      PT LONG TERM GOAL #2   Title  report no pain with ADLs for functional use    Status  Partially Met      PT LONG TERM GOAL #3   Title  reports singing in choir or doing solo performances with no pain or limitation     Status  Not Met            Plan - 01/23/18 1421    Clinical Impression Statement  Pt reports that he neck feel really food today but is having some increase back discomfort. Postural cues give  during standing row and extensions. Continues to report compliance with HEP    Rehab Potential  Good    PT Frequency  2x / week    PT Duration  8 weeks    PT Treatment/Interventions  Electrical Stimulation;Moist Heat;Therapeutic activities;Therapeutic exercise;Patient/family education;Manual techniques    PT Next Visit Plan  ROM and strength       Patient will benefit from skilled therapeutic intervention in order to improve the following deficits and impairments:  Pain, Increased muscle spasms, Impaired tone, Decreased strength, Decreased range of motion  Visit Diagnosis: Acute right-sided thoracic back pain  Neck pain  Muscle tightness  Muscle tone increased     Problem List Patient Active Problem List   Diagnosis Date Noted  . Acute right-sided thoracic back pain 12/28/2017  . Coronary artery calcification seen on CT scan 02/22/2017  . Microhematuria 11/10/2016  . Asthmatic bronchitis 11/10/2016  . Oral cancer ([place]) 01/22/2016  . Pulmonary nodule 01/15/2016  . Hyperthyroidism 05/07/2015  . Loss of weight 05/05/2015  . Appetite impaired 05/05/2015  . General weakness 05/05/2015  . UTI (urinary tract infection) 05/05/2015  . Hypokalemia 05/05/2015  . Abnormal TSH 05/05/2015  . Lower back pain 10/31/2014  . Dysuria 11/14/2013  . Diabetic peripheral neuropathy ([place]) 04/22/2013  . Hypersomnolence 06/14/2012  . Eczema 10/19/2011  . Preventative health care 04/10/2011  . GOITER, MULTINODULAR 05/21/2010  . OSTEOPENIA 04/07/2008  . COLON CANCER 04/07/2008  . Diabetes mellitus with neuropathy ([place]) 01/30/2008  . DEGENERATIVE JOINT DISEASE 01/30/2008  . ARTHRITIS 01/30/2008  . HYPERCHOLESTEROLEMIA 09/03/2007  . OBESITY 09/03/2007  . Essential hypertension 09/03/2007  . CORONARY ARTERY DISEASE 09/03/2007  . Allergic rhinitis 09/03/2007  . Asthma 09/03/2007  . GERD 09/03/2007  . Palpitations 09/03/2007    Scot Jun, PTA 01/23/2018, 2:24 PM  [place], [place], [address] Phone: [phone]   Fax:  [phone]  Name: CLYDINE PARKISON MRN: [id number] Date of Birth: 1946-07-22

## 2018-01-25 ENCOUNTER — Encounter: Payer: Self-pay | Admitting: Physical Therapy

## 2018-01-25 ENCOUNTER — Ambulatory Visit: Payer: Medicare Other | Admitting: Physical Therapy

## 2018-01-25 DIAGNOSIS — M546 Pain in thoracic spine: Secondary | ICD-10-CM | POA: Diagnosis not present

## 2018-01-25 DIAGNOSIS — M542 Cervicalgia: Secondary | ICD-10-CM

## 2018-01-25 DIAGNOSIS — M6289 Other specified disorders of muscle: Secondary | ICD-10-CM

## 2018-01-25 NOTE — Therapy (Signed)
[facility] [address] [place], [place], [address] Phone: [phone]   Fax:  [phone]  Physical Therapy Treatment  Patient Details  Name: [name] [name] MRN: [id number] Date of Birth: 06/27/1946 Referring Provider: [place]   Encounter Date: 01/25/2018  PT End of Session - 01/25/18 1035    Visit Number  6    Date for PT Re-Evaluation  03/05/18    PT Start Time  1000    PT Stop Time  1040    PT Time Calculation (min)  40 min       Past Medical History:  Diagnosis Date  . ARTHRITIS 01/30/2008  . ASTHMA 09/03/2007  . Cancer ([facility]) 1987   palate cancer  . COLON CANCER 04/07/2008   pt states she had colon cancer 1987  . COPD 09/03/2007  . COPD (chronic obstructive pulmonary disease) ([facility]) 04/22/2013  . CORONARY ARTERY DISEASE 09/03/2007  . DEGENERATIVE JOINT DISEASE 01/30/2008  . DIABETES MELLITUS 01/30/2008  . Diabetic peripheral neuropathy ([facility]) 04/22/2013  . Eczema   . GERD 09/03/2007  . GLUCOSE INTOLERANCE 09/03/2007  . GOITER, MULTINODULAR 05/21/2010  . History of blood transfusion   . HYPERCHOLESTEROLEMIA 09/03/2007  . HYPERTENSION 09/03/2007  . Impaired glucose tolerance 04/10/2011  . LVH (left ventricular hypertrophy)   . OBESITY 09/03/2007  . Osteopenia   . Psoriasis   . Shortness of breath dyspnea    With exertion and allergies to pollen    Past Surgical History:  Procedure Laterality Date  . ABDOMINAL HYSTERECTOMY    . CARDIAC CATHETERIZATION    . COLON SURGERY    . FLOOR OF MOUTH BIOPSY N/A 12/24/2015   Procedure: ORAL BIOPSY;  Surgeon: Jefry Rosen, MD;  [facility];  Service: ENT;  Laterality: N/A;  . HEMICOLECTOMY  12/1985   cancer  . HERNIA REPAIR    . MAXILLECTOMY Right 01/22/2016   Procedure: RIGHT SIDE PARTIAL MAXILLECTOMY;  Surgeon: Jefry Rosen, MD;  [facility];  Service: ENT;  Laterality: Right;  . TUBAL LIGATION      There were no vitals filed for this visit.  Subjective  Assessment - 01/25/18 1005    Subjective  doing very well today. 95% better    Currently in Pain?  No/denies                      [facility] Adult PT Treatment/Exercise - 01/25/18 0001      Neck Exercises: Seated   Other Seated Exercise  UBE L 3 2fwd/2 back    Other Seated Exercise  seated row and lat 20# 2 sets 10      Lumbar Exercises: Aerobic   Nustep  L 5 6 min      Lumbar Exercises: Machines for Strengthening   Cybex Lumbar Extension  black tband 2 sets 10      Lumbar Exercises: Standing   Other Standing Lumbar Exercises  standing OH ext and obl 15    Other Standing Lumbar Exercises  red tband hip ext and abd 15 each      Lumbar Exercises: Seated   Sit to Stand  10 reps with wt ball               PT Short Term Goals - 01/16/18 1014      PT SHORT TERM GOAL #1   Title  independent with initial HEP    Status  Achieved          PT Long Term Goals - 01/25/18 1030      PT LONG TERM GOAL #1   Title  increase cervical AROM 50% for functional use    Status  Achieved      PT LONG TERM GOAL #2   Title  report no pain with ADLs for functional use    Status  Partially Met      PT LONG TERM GOAL #3   Title  reports singing in choir or doing solo performances with no pain or limitation     Status  Partially Met            Plan - 01/25/18 1036    Clinical Impression Statement  overall pt is doing very well, no increased pain today to trial without MH. Progressing with goals and return to PLOF without issues. Postural cuing with ex - pt tends to hold head in flexion and ext back on hip flexors     PT Treatment/Interventions  Electrical Stimulation;Moist Heat;Therapeutic activities;Therapeutic exercise;Patient/family education;Manual techniques    PT Next Visit Plan  if doing as well next session derease to 1 x week       Patient will benefit from skilled therapeutic intervention in order to improve the following deficits and impairments:  Pain,  Increased muscle spasms, Impaired tone, Decreased strength, Decreased range of motion  Visit Diagnosis: Acute right-sided thoracic back pain  Neck pain  Muscle tightness  Muscle tone increased     Problem List Patient Active Problem List   Diagnosis Date Noted  . Acute right-sided thoracic back pain 12/28/2017  . Coronary artery calcification seen on CT scan 02/22/2017  . Microhematuria 11/10/2016  . Asthmatic bronchitis 11/10/2016  . Oral cancer ([place]) 01/22/2016  . Pulmonary nodule 01/15/2016  . Hyperthyroidism 05/07/2015  . Loss of weight 05/05/2015  . Appetite impaired 05/05/2015  . General weakness 05/05/2015  . UTI (urinary tract infection) 05/05/2015  . Hypokalemia 05/05/2015  . Abnormal TSH 05/05/2015  . Lower back pain 10/31/2014  . Dysuria 11/14/2013  . Diabetic peripheral neuropathy ([place]) 04/22/2013  . Hypersomnolence 06/14/2012  . Eczema 10/19/2011  . Preventative health care 04/10/2011  . GOITER, MULTINODULAR 05/21/2010  . OSTEOPENIA 04/07/2008  . COLON CANCER 04/07/2008  . Diabetes mellitus with neuropathy ([place]) 01/30/2008  . DEGENERATIVE JOINT DISEASE 01/30/2008  . ARTHRITIS 01/30/2008  . HYPERCHOLESTEROLEMIA 09/03/2007  . OBESITY 09/03/2007  . Essential hypertension 09/03/2007  . CORONARY ARTERY DISEASE 09/03/2007  . Allergic rhinitis 09/03/2007  . Asthma 09/03/2007  . GERD 09/03/2007  . Palpitations 09/03/2007    Korah Hufstedler,ANGIE PTA 01/25/2018, 10:38 AM  [place], [place], [address] Phone: [phone]   Fax:  [phone]  Name: GWENIVERE HIRALDO MRN: [id number] Date of Birth: [birth year]

## 2018-01-30 ENCOUNTER — Encounter: Payer: Self-pay | Admitting: Physical Therapy

## 2018-01-30 ENCOUNTER — Ambulatory Visit: Payer: Medicare Other | Admitting: Physical Therapy

## 2018-01-30 DIAGNOSIS — M6289 Other specified disorders of muscle: Secondary | ICD-10-CM | POA: Diagnosis not present

## 2018-01-30 DIAGNOSIS — M542 Cervicalgia: Secondary | ICD-10-CM | POA: Diagnosis not present

## 2018-01-30 DIAGNOSIS — M546 Pain in thoracic spine: Secondary | ICD-10-CM | POA: Diagnosis not present

## 2018-01-30 NOTE — Therapy (Signed)
[place], [place], [address] Phone: [phone]   Fax:  [phone]  Physical Therapy Treatment  Patient Details  Name: [name] [name] MRN: [id number] Date of Birth: January 11, 1946 Referring Provider: Raeford Razor   Encounter Date: 01/30/2018  PT End of Session - 01/30/18 1425    Visit Number  7    Date for PT Re-Evaluation  03/05/18    PT Start Time  1345    PT Stop Time  1425    PT Time Calculation (min)  40 min    Activity Tolerance  Patient tolerated treatment well    Behavior During Therapy  [facility] for tasks assessed/performed       Past Medical History:  Diagnosis Date  . ARTHRITIS 01/30/2008  . ASTHMA 09/03/2007  . Cancer ([place]) 1987   palate cancer  . COLON CANCER 04/07/2008   pt states she had colon cancer 1987  . COPD 09/03/2007  . COPD (chronic obstructive pulmonary disease) ([place]) 04/22/2013  . CORONARY ARTERY DISEASE 09/03/2007  . DEGENERATIVE JOINT DISEASE 01/30/2008  . DIABETES MELLITUS 01/30/2008  . Diabetic peripheral neuropathy ([place]) 04/22/2013  . Eczema   . GERD 09/03/2007  . GLUCOSE INTOLERANCE 09/03/2007  . GOITER, MULTINODULAR 05/21/2010  . History of blood transfusion   . HYPERCHOLESTEROLEMIA 09/03/2007  . HYPERTENSION 09/03/2007  . Impaired glucose tolerance 04/10/2011  . LVH (left ventricular hypertrophy)   . OBESITY 09/03/2007  . Osteopenia   . Psoriasis   . Shortness of breath dyspnea    With exertion and allergies to pollen    Past Surgical History:  Procedure Laterality Date  . ABDOMINAL HYSTERECTOMY    . CARDIAC CATHETERIZATION    . COLON SURGERY    . FLOOR OF MOUTH BIOPSY N/A 12/24/2015   Procedure: ORAL BIOPSY;  Surgeon: Izora Gala, MD;  Location: [facility];  Service: ENT;  Laterality: N/A;  . HEMICOLECTOMY  12/1985   cancer  . HERNIA REPAIR    . MAXILLECTOMY Right 01/22/2016   Procedure: RIGHT SIDE PARTIAL MAXILLECTOMY;  Surgeon: Izora Gala, MD;  Location: [facility];   Service: ENT;  Laterality: Right;  . TUBAL LIGATION      There were no vitals filed for this visit.  Subjective Assessment - 01/30/18 1348    Subjective  Pt reports 3 occasions of pinching in her neck over the weekend    Currently in Pain?  No/denies                      [facility] Adult PT Treatment/Exercise - 01/30/18 0001      Neck Exercises: Standing   Other Standing Exercises  Shoulder Ext pulley 5lb 2x10       Neck Exercises: Seated   Other Seated Exercise  seated row and lat 20# 2 sets 10      Lumbar Exercises: Aerobic   Recumbent Bike  L4 101fd/3rev    Nustep  L 5 6 min      Lumbar Exercises: Machines for Strengthening   Cybex Lumbar Extension  black tband 2 sets 10      Lumbar Exercises: Standing   Other Standing Lumbar Exercises  standing OH ext and obl 15    Other Standing Lumbar Exercises  red tband hip ext and abd 15 each      Lumbar Exercises: Seated   Sit to Stand  10 reps x2, with wt ball  PT Short Term Goals - 01/16/18 1014      PT SHORT TERM GOAL #1   Title  independent with initial HEP    Status  Achieved        PT Long Term Goals - 01/30/18 1354      PT LONG TERM GOAL #2   Title  report no pain with ADLs for functional use    Status  Partially Met      PT LONG TERM GOAL #3   Title  reports singing in choir or doing solo performances with no pain or limitation     Status  Partially Met            Plan - 01/30/18 1426    Clinical Impression Statement  Pt continues to do well overall. she does reports some pinching over the weekend. She stated that she had a few long days in church. postural cueing during stand shoulder ext and with hip exercises.    Rehab Potential  Good    PT Frequency  2x / week    PT Duration  8 weeks    PT Next Visit Plan  if doing as well next session decrease to 1 x week       Patient will benefit from skilled therapeutic intervention in order to improve the following deficits  and impairments:  Pain, Increased muscle spasms, Impaired tone, Decreased strength, Decreased range of motion  Visit Diagnosis: Neck pain  Acute right-sided thoracic back pain  Muscle tone increased  Muscle tightness     Problem List Patient Active Problem List   Diagnosis Date Noted  . Acute right-sided thoracic back pain 12/28/2017  . Coronary artery calcification seen on CT scan 02/22/2017  . Microhematuria 11/10/2016  . Asthmatic bronchitis 11/10/2016  . Oral cancer ([place]) 01/22/2016  . Pulmonary nodule 01/15/2016  . Hyperthyroidism 05/07/2015  . Loss of weight 05/05/2015  . Appetite impaired 05/05/2015  . General weakness 05/05/2015  . UTI (urinary tract infection) 05/05/2015  . Hypokalemia 05/05/2015  . Abnormal TSH 05/05/2015  . Lower back pain 10/31/2014  . Dysuria 11/14/2013  . Diabetic peripheral neuropathy ([place]) 04/22/2013  . Hypersomnolence 06/14/2012  . Eczema 10/19/2011  . Preventative health care 04/10/2011  . GOITER, MULTINODULAR 05/21/2010  . OSTEOPENIA 04/07/2008  . COLON CANCER 04/07/2008  . Diabetes mellitus with neuropathy ([place]) 01/30/2008  . DEGENERATIVE JOINT DISEASE 01/30/2008  . ARTHRITIS 01/30/2008  . HYPERCHOLESTEROLEMIA 09/03/2007  . OBESITY 09/03/2007  . Essential hypertension 09/03/2007  . CORONARY ARTERY DISEASE 09/03/2007  . Allergic rhinitis 09/03/2007  . Asthma 09/03/2007  . GERD 09/03/2007  . Palpitations 09/03/2007    Scot Jun, PTA 01/30/2018, 2:28 PM  [place], [place], [address] Phone: [phone]   Fax:  [phone]  Name: TENAYA HILYER MRN: [id number] Date of Birth: 03/30/1946

## 2018-02-01 ENCOUNTER — Encounter: Payer: Self-pay | Admitting: Physical Therapy

## 2018-02-01 ENCOUNTER — Ambulatory Visit: Payer: Medicare Other | Admitting: Physical Therapy

## 2018-02-01 DIAGNOSIS — M6289 Other specified disorders of muscle: Secondary | ICD-10-CM

## 2018-02-01 DIAGNOSIS — M542 Cervicalgia: Secondary | ICD-10-CM

## 2018-02-01 DIAGNOSIS — M546 Pain in thoracic spine: Secondary | ICD-10-CM | POA: Diagnosis not present

## 2018-02-01 NOTE — Therapy (Signed)
[place], [place], [address] Phone: [phone]   Fax:  [phone]  Physical Therapy Treatment  Patient Details  Name: [name] [name] MRN: [id number] Date of Birth: Feb 18, 1946 Referring Provider: Raeford Razor   Encounter Date: 02/01/2018  PT End of Session - 02/01/18 1422    Visit Number  8    Date for PT Re-Evaluation  03/05/18    PT Start Time  1345    PT Stop Time  1425    PT Time Calculation (min)  40 min    Activity Tolerance  Patient tolerated treatment well    Behavior During Therapy  [facility] for tasks assessed/performed       Past Medical History:  Diagnosis Date  . ARTHRITIS 01/30/2008  . ASTHMA 09/03/2007  . Cancer ([place]) 1987   palate cancer  . COLON CANCER 04/07/2008   pt states she had colon cancer 1987  . COPD 09/03/2007  . COPD (chronic obstructive pulmonary disease) ([place]) 04/22/2013  . CORONARY ARTERY DISEASE 09/03/2007  . DEGENERATIVE JOINT DISEASE 01/30/2008  . DIABETES MELLITUS 01/30/2008  . Diabetic peripheral neuropathy ([place]) 04/22/2013  . Eczema   . GERD 09/03/2007  . GLUCOSE INTOLERANCE 09/03/2007  . GOITER, MULTINODULAR 05/21/2010  . History of blood transfusion   . HYPERCHOLESTEROLEMIA 09/03/2007  . HYPERTENSION 09/03/2007  . Impaired glucose tolerance 04/10/2011  . LVH (left ventricular hypertrophy)   . OBESITY 09/03/2007  . Osteopenia   . Psoriasis   . Shortness of breath dyspnea    With exertion and allergies to pollen    Past Surgical History:  Procedure Laterality Date  . ABDOMINAL HYSTERECTOMY    . CARDIAC CATHETERIZATION    . COLON SURGERY    . FLOOR OF MOUTH BIOPSY N/A 12/24/2015   Procedure: ORAL BIOPSY;  Surgeon: Izora Gala, MD;  Location: [facility];  Service: ENT;  Laterality: N/A;  . HEMICOLECTOMY  12/1985   cancer  . HERNIA REPAIR    . MAXILLECTOMY Right 01/22/2016   Procedure: RIGHT SIDE PARTIAL MAXILLECTOMY;  Surgeon: Izora Gala, MD;  Location: [facility];   Service: ENT;  Laterality: Right;  . TUBAL LIGATION      There were no vitals filed for this visit.  Subjective Assessment - 02/01/18 1343    Subjective  Pt reports some fatigue from ironing this morning    Currently in Pain?  No/denies    Pain Score  0-No pain                      [facility] Adult PT Treatment/Exercise - 02/01/18 0001      Neck Exercises: Standing   Other Standing Exercises  Shoulder Ext pulley 5lb 2x10       Neck Exercises: Seated   Other Seated Exercise  seated row and lat 20# 2 sets 10      Lumbar Exercises: Aerobic   Recumbent Bike  L4 53fd/3rev    Nustep  L 5 4 min      Lumbar Exercises: Standing   Row  Both;20 reps;Theraband    Theraband Level (Row)  Level 2 (Red)    Other Standing Lumbar Exercises  standing OH ext and obl 15    Other Standing Lumbar Exercises  red tband hip ext and abd 15 each      Lumbar Exercises: Seated   Sit to Stand  10 reps x2, yellow ball  PT Short Term Goals - 01/16/18 1014      PT SHORT TERM GOAL #1   Title  independent with initial HEP    Status  Achieved        PT Long Term Goals - 01/30/18 1354      PT LONG TERM GOAL #2   Title  report no pain with ADLs for functional use    Status  Partially Met      PT LONG TERM GOAL #3   Title  reports singing in choir or doing solo performances with no pain or limitation     Status  Partially Met            Plan - 02/01/18 1423    Clinical Impression Statement  Pt enters clinic reporting increase fatigue from her morning chores and it showed with some interventions. No reports of increase pain, postural cues given during standing shoulder ER. Some compensating with standing his abd and ext.      Rehab Potential  Good    PT Frequency  2x / week    PT Duration  8 weeks    PT Treatment/Interventions  Electrical Stimulation;Moist Heat;Therapeutic activities;Therapeutic exercise;Patient/family education;Manual techniques    PT Next  Visit Plan  if doing as well next session decrease to 1 x week       Patient will benefit from skilled therapeutic intervention in order to improve the following deficits and impairments:  Pain, Increased muscle spasms, Impaired tone, Decreased strength, Decreased range of motion  Visit Diagnosis: Neck pain  Acute right-sided thoracic back pain  Muscle tone increased  Muscle tightness     Problem List Patient Active Problem List   Diagnosis Date Noted  . Acute right-sided thoracic back pain 12/28/2017  . Coronary artery calcification seen on CT scan 02/22/2017  . Microhematuria 11/10/2016  . Asthmatic bronchitis 11/10/2016  . Oral cancer ([place]) 01/22/2016  . Pulmonary nodule 01/15/2016  . Hyperthyroidism 05/07/2015  . Loss of weight 05/05/2015  . Appetite impaired 05/05/2015  . General weakness 05/05/2015  . UTI (urinary tract infection) 05/05/2015  . Hypokalemia 05/05/2015  . Abnormal TSH 05/05/2015  . Lower back pain 10/31/2014  . Dysuria 11/14/2013  . Diabetic peripheral neuropathy ([place]) 04/22/2013  . Hypersomnolence 06/14/2012  . Eczema 10/19/2011  . Preventative health care 04/10/2011  . GOITER, MULTINODULAR 05/21/2010  . OSTEOPENIA 04/07/2008  . COLON CANCER 04/07/2008  . Diabetes mellitus with neuropathy ([place]) 01/30/2008  . DEGENERATIVE JOINT DISEASE 01/30/2008  . ARTHRITIS 01/30/2008  . HYPERCHOLESTEROLEMIA 09/03/2007  . OBESITY 09/03/2007  . Essential hypertension 09/03/2007  . CORONARY ARTERY DISEASE 09/03/2007  . Allergic rhinitis 09/03/2007  . Asthma 09/03/2007  . GERD 09/03/2007  . Palpitations 09/03/2007    Scot Jun, PTA 02/01/2018, 2:26 PM  [place], [place], [address] Phone: [phone]   Fax:  [phone]  Name: CHRYSTIE HAGWOOD MRN: [id number] Date of Birth: 09-Sep-1946

## 2018-02-06 ENCOUNTER — Ambulatory Visit: Payer: Medicare Other | Admitting: Physical Therapy

## 2018-02-06 ENCOUNTER — Encounter: Payer: Self-pay | Admitting: Physical Therapy

## 2018-02-06 DIAGNOSIS — M546 Pain in thoracic spine: Secondary | ICD-10-CM

## 2018-02-06 DIAGNOSIS — M542 Cervicalgia: Secondary | ICD-10-CM

## 2018-02-06 DIAGNOSIS — M6289 Other specified disorders of muscle: Secondary | ICD-10-CM

## 2018-02-06 NOTE — Therapy (Signed)
During this treatment session, the therapist was present, participating in and directing the treatment. [place], [place], [address] Phone: [phone]   Fax:  [phone]  Physical Therapy Treatment  Patient Details  Name: [name] [name] MRN: [id number] Date of Birth: [birth year] Referring Provider: Raeford Razor   Encounter Date: 02/06/2018  PT End of Session - 02/06/18 0915    Visit Number  9    Date for PT Re-Evaluation  03/05/18    PT Start Time  0844    PT Stop Time  0929    PT Time Calculation (min)  45 min    Activity Tolerance  Patient tolerated treatment well    Behavior During Therapy  [facility] for tasks assessed/performed       Past Medical History:  Diagnosis Date  . ARTHRITIS 01/30/2008  . ASTHMA 09/03/2007  . 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 COLON CANCER 04/07/2008   pt states she had colon cancer 1987  . COPD 09/03/2007  . COPD (chronic obstructive pulmonary disease) ([place]) 04/22/2013  . CORONARY ARTERY DISEASE 09/03/2007  . DEGENERATIVE JOINT DISEASE 01/30/2008  . DIABETES MELLITUS 01/30/2008  . Diabetic peripheral neuropathy ([place]) 04/22/2013  . Eczema   . GERD 09/03/2007  . GLUCOSE INTOLERANCE 09/03/2007  . GOITER, MULTINODULAR 05/21/2010  . History of blood transfusion   . HYPERCHOLESTEROLEMIA 09/03/2007  . HYPERTENSION 09/03/2007  . Impaired glucose tolerance 04/10/2011  . LVH (left ventricular hypertrophy)   . OBESITY 09/03/2007  . Osteopenia   . Psoriasis   . Shortness of breath dyspnea    With exertion and allergies to pollen    Past Surgical History:  Procedure Laterality Date  . ABDOMINAL HYSTERECTOMY    . CARDIAC CATHETERIZATION    . COLON SURGERY    . FLOOR OF MOUTH BIOPSY N/A 12/24/2015   Procedure: ORAL BIOPSY;  Surgeon: Izora Gala, MD;  Location: [place];  Service: ENT;  Laterality: N/A;  . HEMICOLECTOMY  12/1985   cancer  . HERNIA REPAIR    . MAXILLECTOMY Right  01/22/2016   Procedure: RIGHT SIDE PARTIAL MAXILLECTOMY;  Surgeon: Izora Gala, MD;  Location: [place];  Service: ENT;  Laterality: Right;  . TUBAL LIGATION      There were no vitals filed for this visit.  Subjective Assessment - 02/06/18 0843    Subjective  Pt. reports feeling good today. Pt. reports that she had a sharp pain in her neck this weekend with no direct cause that she can remember but it went away instantly. Overall, pt. reports believing that she is doing a lot better.    Currently in Pain?  No/denies    Pain Score  0-No pain                      [facility] Adult PT Treatment/Exercise - 02/06/18 0001      Neck Exercises: Seated   Other Seated Exercise  shoulder ext and rows with red tband 2x10 each    Other Seated Exercise  seated row and lat 20# 2x10      Lumbar Exercises: Aerobic   UBE (Upper Arm Bike)  L4x6 mins (68fd/3back)    Nustep  L5x673ms      Lumbar Exercises: Machines for Strengthening   Cybex Lumbar Extension  black tband 2x10      Lumbar Exercises: Standing   Other Standing Lumbar Exercises  shrugs and backward rolls 5# 2x10  each, ER with red tband 2x10    Other Standing Lumbar Exercises  red tband hip ABD and hip ext 2x10 each      Lumbar Exercises: Seated   Sit to Stand  10 reps x2 with yellow wt ball into [facility] press               PT Short Term Goals - 01/16/18 1014      PT SHORT TERM GOAL #1   Title  independent with initial HEP    Status  Achieved        PT Long Term Goals - 01/30/18 1354      PT LONG TERM GOAL #2   Title  report no pain with ADLs for functional use    Status  Partially Met      PT LONG TERM GOAL #3   Title  reports singing in choir or doing solo performances with no pain or limitation     Status  Partially Met            Plan - 02/06/18 0906    Clinical Impression Statement  Pt. tolerated tx well with no reports of pain. Occasional postural cues during scap stabilization exercises. Verbal and  tactile cues to keep elbows tucked in with resisted ER. Verbal and tactile cues given with hip ext and hip ABD to not go into forward lumbar flexion or side bending.    Rehab Potential  Good    PT Frequency  2x / week    PT Duration  8 weeks    PT Treatment/Interventions  Electrical Stimulation;Moist Heat;Therapeutic activities;Therapeutic exercise;Patient/family education;Manual techniques    Consulted and Agree with Plan of Care  Patient       Patient will benefit from skilled therapeutic intervention in order to improve the following deficits and impairments:  Pain, Increased muscle spasms, Impaired tone, Decreased strength, Decreased range of motion  Visit Diagnosis: Neck pain  Acute right-sided thoracic back pain  Muscle tone increased  Muscle tightness     Problem List Patient Active Problem List   Diagnosis Date Noted  . Acute right-sided thoracic back pain 12/28/2017  . Coronary artery calcification seen on CT scan 02/22/2017  . Microhematuria 11/10/2016  . Asthmatic bronchitis 11/10/2016  . Oral cancer ([place]) 01/22/2016  . Pulmonary nodule 01/15/2016  . Hyperthyroidism 05/07/2015  . Loss of weight 05/05/2015  . Appetite impaired 05/05/2015  . General weakness 05/05/2015  . UTI (urinary tract infection) 05/05/2015  . Hypokalemia 05/05/2015  . Abnormal TSH 05/05/2015  . Lower back pain 10/31/2014  . Dysuria 11/14/2013  . Diabetic peripheral neuropathy ([place]) 04/22/2013  . Hypersomnolence 06/14/2012  . Eczema 10/19/2011  . Preventative health care 04/10/2011  . GOITER, MULTINODULAR 05/21/2010  . OSTEOPENIA 04/07/2008  . COLON CANCER 04/07/2008  . Diabetes mellitus with neuropathy ([place]) 01/30/2008  . DEGENERATIVE JOINT DISEASE 01/30/2008  . ARTHRITIS 01/30/2008  . HYPERCHOLESTEROLEMIA 09/03/2007  . OBESITY 09/03/2007  . Essential hypertension 09/03/2007  . CORONARY ARTERY DISEASE 09/03/2007  . Allergic rhinitis 09/03/2007  . Asthma 09/03/2007  . GERD  09/03/2007  . Palpitations 09/03/2007    Juliann Pulse SPT 02/06/2018, 9:28 AM  [place] [place], [place], [address] Phone: [phone]   Fax:  [phone]  Name: SHANE MELBY MRN: [id number] Date of Birth: [birth year]

## 2018-02-08 ENCOUNTER — Encounter: Payer: Self-pay | Admitting: Physical Therapy

## 2018-02-08 ENCOUNTER — Ambulatory Visit: Payer: Medicare Other | Admitting: Physical Therapy

## 2018-02-08 DIAGNOSIS — M542 Cervicalgia: Secondary | ICD-10-CM

## 2018-02-08 DIAGNOSIS — M546 Pain in thoracic spine: Secondary | ICD-10-CM

## 2018-02-08 DIAGNOSIS — M6289 Other specified disorders of muscle: Secondary | ICD-10-CM | POA: Diagnosis not present

## 2018-02-08 NOTE — Therapy (Signed)
During this treatment session, the therapist was present, participating in and directing the treatment. [place], [place], [address] Phone: [phone]   Fax:  [phone]  Physical Therapy Treatment  Patient Details  Name: [name] [name] MRN: [id number] Date of Birth: May 28, 1946 Referring Provider: Raeford Razor   Encounter Date: 02/08/2018  PT End of Session - 02/08/18 1000    Visit Number  10    Date for PT Re-Evaluation  03/05/18    PT Start Time  0930    PT Stop Time  1013    PT Time Calculation (min)  43 min    Activity Tolerance  Patient tolerated treatment well    Behavior During Therapy  [facility] for tasks assessed/performed       Past Medical History:  Diagnosis Date  . ARTHRITIS 01/30/2008  . ASTHMA 09/03/2007  . Cancer ([place]) 1987   palate cancer  . COLON CANCER 04/07/2008   pt states she had colon cancer 1987  . COPD 09/03/2007  . COPD (chronic obstructive pulmonary disease) ([place]) 04/22/2013  . CORONARY ARTERY DISEASE 09/03/2007  . DEGENERATIVE JOINT DISEASE 01/30/2008  . DIABETES MELLITUS 01/30/2008  . Diabetic peripheral neuropathy ([place]) 04/22/2013  . Eczema   . GERD 09/03/2007  . GLUCOSE INTOLERANCE 09/03/2007  . GOITER, MULTINODULAR 05/21/2010  . History of blood transfusion   . HYPERCHOLESTEROLEMIA 09/03/2007  . HYPERTENSION 09/03/2007  . Impaired glucose tolerance 04/10/2011  . LVH (left ventricular hypertrophy)   . OBESITY 09/03/2007  . Osteopenia   . Psoriasis   . Shortness of breath dyspnea    With exertion and allergies to pollen    Past Surgical History:  Procedure Laterality Date  . ABDOMINAL HYSTERECTOMY    . CARDIAC CATHETERIZATION    . COLON SURGERY    . FLOOR OF MOUTH BIOPSY N/A 12/24/2015   Procedure: ORAL BIOPSY;  Surgeon: Izora Gala, MD;  Location: [place];  Service: ENT;  Laterality: N/A;  . HEMICOLECTOMY  12/1985   cancer  . HERNIA REPAIR    . MAXILLECTOMY Right  01/22/2016   Procedure: RIGHT SIDE PARTIAL MAXILLECTOMY;  Surgeon: Izora Gala, MD;  Location: [place];  Service: ENT;  Laterality: Right;  . TUBAL LIGATION      There were no vitals filed for this visit.  Subjective Assessment - 02/08/18 0929    Subjective  Pt. reports feeling tired this morning. Pt. reports having some LBP late Tuesday night and has some soreness today.     Currently in Pain?  Yes    Pain Score  4     Pain Location  Back    Pain Orientation  Lower                      [facility] Adult PT Treatment/Exercise - 02/08/18 0001      Neck Exercises: Seated   Other Seated Exercise  shoulder extension with red tband 2x10, shoulder ER with red tband 2x15    Other Seated Exercise  seated row and lat 20# 2x10      Lumbar Exercises: Aerobic   UBE (Upper Arm Bike)  L4x6 mins (60fd/3back)    Nustep  L5x640ms      Lumbar Exercises: Standing   Row  Both;20 reps;Theraband    Theraband Level (Row)  Level 2 (Red)      Moist Heat Therapy   Number Minutes Moist Heat  15 Minutes    Moist  Heat Location  Hip left low back/hip area               PT Short Term Goals - 01/16/18 1014      PT SHORT TERM GOAL #1   Title  independent with initial HEP    Status  Achieved        PT Long Term Goals - 02/08/18 1002      PT LONG TERM GOAL #1   Title  increase cervical AROM 50% for functional use    Time  8    Period  Weeks    Status  Achieved      PT LONG TERM GOAL #2   Title  report no pain with ADLs for functional use    Status  Partially Met      PT LONG TERM GOAL #3   Title  reports singing in choir or doing solo performances with no pain or limitation     Status  Partially Met            Plan - 02/08/18 1000    Clinical Impression Statement  Pt. came in with some L LBP/hip pain today that started late Tuesday night. Pt. tolerated all therex well with no reports of increase pain. Pt. continues to need verbal cues with ER to keep elbows tucked in  and postural cues during scap stabilization exercises. Due to pt. pain, tried taking tx a little easier while still exercising and took longer rests.    Rehab Potential  Good    PT Frequency  2x / week    PT Duration  8 weeks    PT Treatment/Interventions  Electrical Stimulation;Moist Heat;Therapeutic activities;Therapeutic exercise;Patient/family education;Manual techniques    PT Next Visit Plan  if doing as well next session derease to 1 x week    Consulted and Agree with Plan of Care  Patient       Patient will benefit from skilled therapeutic intervention in order to improve the following deficits and impairments:  Pain, Increased muscle spasms, Impaired tone, Decreased strength, Decreased range of motion  Visit Diagnosis: Neck pain  Acute right-sided thoracic back pain  Muscle tone increased  Muscle tightness     Problem List Patient Active Problem List   Diagnosis Date Noted  . Acute right-sided thoracic back pain 12/28/2017  . Coronary artery calcification seen on CT scan 02/22/2017  . Microhematuria 11/10/2016  . Asthmatic bronchitis 11/10/2016  . Oral cancer ([place]) 01/22/2016  . Pulmonary nodule 01/15/2016  . Hyperthyroidism 05/07/2015  . Loss of weight 05/05/2015  . Appetite impaired 05/05/2015  . General weakness 05/05/2015  . UTI (urinary tract infection) 05/05/2015  . Hypokalemia 05/05/2015  . Abnormal TSH 05/05/2015  . Lower back pain 10/31/2014  . Dysuria 11/14/2013  . Diabetic peripheral neuropathy ([place]) 04/22/2013  . Hypersomnolence 06/14/2012  . Eczema 10/19/2011  . Preventative health care 04/10/2011  . GOITER, MULTINODULAR 05/21/2010  . OSTEOPENIA 04/07/2008  . COLON CANCER 04/07/2008  . Diabetes mellitus with neuropathy ([place]) 01/30/2008  . DEGENERATIVE JOINT DISEASE 01/30/2008  . ARTHRITIS 01/30/2008  . HYPERCHOLESTEROLEMIA 09/03/2007  . OBESITY 09/03/2007  . Essential hypertension 09/03/2007  . CORONARY ARTERY DISEASE 09/03/2007  . Allergic  rhinitis 09/03/2007  . Asthma 09/03/2007  . GERD 09/03/2007  . Palpitations 09/03/2007    Juliann Pulse SPT 02/08/2018, 10:04 AM  [address] [address] [place], [place], [address] Phone: [phone]   Fax:  [phone]  Name: [name] [name] MRN: [id number] Date of Birth: 17-Sep-1946

## 2018-02-14 ENCOUNTER — Encounter: Payer: Self-pay | Admitting: Acute Care

## 2018-02-14 ENCOUNTER — Telehealth: Payer: Self-pay | Admitting: Pulmonary Disease

## 2018-02-14 ENCOUNTER — Ambulatory Visit (INDEPENDENT_AMBULATORY_CARE_PROVIDER_SITE_OTHER): Payer: Medicare Other | Admitting: Acute Care

## 2018-02-14 VITALS — BP 110/64 | HR 77 | Ht 61.0 in | Wt 130.2 lb

## 2018-02-14 DIAGNOSIS — J45901 Unspecified asthma with (acute) exacerbation: Secondary | ICD-10-CM

## 2018-02-14 DIAGNOSIS — J4530 Mild persistent asthma, uncomplicated: Secondary | ICD-10-CM | POA: Diagnosis not present

## 2018-02-14 MED ORDER — METHYLPREDNISOLONE ACETATE 80 MG/ML IJ SUSP
80.0000 mg | Freq: Once | INTRAMUSCULAR | Status: AC
Start: 2018-02-14 — End: 2018-02-14
  Administered 2018-02-14: 80 mg via INTRAMUSCULAR

## 2018-02-14 MED ORDER — HYDROCODONE-HOMATROPINE 5-1.5 MG/5ML PO SYRP: ORAL_SOLUTION | ORAL | 0 refills | Status: DC

## 2018-02-14 MED ORDER — LEVALBUTEROL HCL 0.63 MG/3ML IN NEBU
0.6300 mg | INHALATION_SOLUTION | Freq: Once | RESPIRATORY_TRACT | Status: AC
  Administered 2018-02-14: 0.63 mg via RESPIRATORY_TRACT

## 2018-02-14 MED ORDER — DOXYCYCLINE HYCLATE 100 MG PO TABS: 100.0000 mg | ORAL_TABLET | Freq: Two times a day (BID) | ORAL | 0 refills | Status: DC

## 2018-02-14 MED ORDER — PREDNISONE 10 MG PO TABS: ORAL_TABLET | ORAL | 0 refills | Status: DC

## 2018-02-14 NOTE — Patient Instructions (Addendum)
It good to see you today. [place] treatment now. We will give you Depo Medrol 80 mg injection  IM today Prednisone taper; 10 mg tablets: 3 tabs x 2 days, 2 tabs x 2 days 1 tab x 2 days then stop. ( Start 3/7, Thursday) Doxycycline 100 mg twice daily x 7 days.with full glass of water Please take probiotic with antibiotic  Continue Symbicort 160 2 puffs twice daily Use your rescue inhaler as needed for breakthrough shortness of breath or wheezing. Mucinex 1200 mg with a full glass of water once daily to help with secretions. Hydromet cough syrup 5 cc's at bedtime for cough Delsym cough syrup every 12 hours as directed during the day. ( Non-sedating) Follow up with Dr. Elsworth Soho or Judson Roch NP in 2 weeks Please contact office for sooner follow up if symptoms do not improve or worsen or seek emergency care

## 2018-02-14 NOTE — Telephone Encounter (Signed)
Ignore this message appointment already made.Hillery Hunter

## 2018-02-14 NOTE — Progress Notes (Signed)
History of Present Illness [name] [name] is a 72 y.o. female  AAF, former  Smoker ( 15 pack year smoking history, quit 1980)  with asthmatic bronchitis, GERD and a component of VCD. Additional history of oral cancer ( Tx. With excision 2017) , and incidental finding of lung nodule on CT. She is followed by Dr. Elsworth Soho.  Or note: Asthma is worse during spring, fall &extreme heat. Maintained on Symbicort as maintenance  and rescue of  xopenex MDI due to after taste &palpitations with albuterol   02/14/2018 Acute OV: Pt. Presents for acute OV. She was recently seen by Dr. Elsworth Soho on 01/12/2018 for annual check up. At that time she reported she was doing well on Symbicort once daily, and rare use of rescue inhaler. He Decreased Symbicort to 80/4.5 - 2 puffs twice daily, and was considering further step down in the future. She presents today stating she has wheezing shortness of breath and dyspnea since 02/12/2018. She did not start the 80 mg dosage of symbicort prescribed.She has continued with her 180 mg dosing. She states she was tired and developed the above symptoms with a runny nose. Secretions were yellow. She states she has cough with yellow green secretions. She denies fever, chest pain, orthopnea of hemoptysis.  Test Results: Significant tests/ events reviewed 02/2017 56m RLL nodule, stable since 2017, benign  12/2012 Spirometry >>FEV1 84%   CBC Latest Ref Rng & Units 11/13/2017 11/04/2016 02/08/2016  WBC 4.0 - 10.5 K/uL 6.6 7.7 8.4  Hemoglobin 12.0 - 15.0 g/dL 11.9(L) 11.6(L) 10.9(L)  Hematocrit 36.0 - 46.0 % 37.4 35.6(L) 34.1(L)  Platelets 150.0 - 400.0 K/uL 223.0 203.0 378    BMP Latest Ref Rng & Units 11/13/2017 05/11/2017 11/04/2016  Glucose 70 - 99 mg/dL 78 90 84  BUN 6 - 23 mg/dL '16 11 14  ' Creatinine 0.40 - 1.20 mg/dL 0.77 0.81 0.79  Sodium 135 - 145 mEq/L 142 142 144  Potassium 3.5 - 5.1 mEq/L 3.7 4.3 3.9  Chloride 96 - 112 mEq/L 103 105 106  CO2 19 - 32 mEq/L 30 32 30  Calcium 8.4 -  10.5 mg/dL 9.5 9.3 9.2    BNP No results found for: BNP  ProBNP No results found for: PROBNP  PFT No results found for: FEV1PRE, FEV1POST, FVCPRE, FVCPOST, TLC, DLCOUNC, PREFEV1FVCRT, PSTFEV1FVCRT  No results found.   Past medical hx Past Medical History:  Diagnosis Date  . ARTHRITIS 01/30/2008  . ASTHMA 09/03/2007  . [place]   palate cancer  . COLON CANCER 04/07/2008   pt states she had colon cancer 1987  . COPD 09/03/2007  . COPD ([place] disease) [place] 04/22/2013  . CORONARY ARTERY DISEASE 09/03/2007  . DEGENERATIVE JOINT DISEASE 01/30/2008  . DIABETES MELLITUS 01/30/2008  . [place]abetic peripheral [place] ([place] 04/22/2013  . Eczema   . GERD 09/03/2007  . GLUCOSE INTOLERANCE 09/03/2007  . GOITER, MULTINODULAR 05/21/2010  . History of blood transfusion   . HYPERCHOLESTEROLEMIA 09/03/2007  . HYPERTENSION 09/03/2007  . Impaired glucose tolerance 04/10/2011  . LVH (left ventricular hypertrophy)   . OBESITY 09/03/2007  . Osteopenia   . Psoriasis   . Shortness of breath dyspnea    With exertion and allergies to pollen     Social History   Tobacco Use  . Smoking status: Former Smoker    Packs/day: 1.00    Years: 15.00    Pack years: 15.00    Types: Cigarettes    Last attempt to quit:  12/12/1978    Years since quitting: 39.2  . Smokeless tobacco: Never Used  Substance Use Topics  . Alcohol use: No  . Drug use: No    Ms.Reliford reports that she quit smoking about 39 years ago. Her smoking use included cigarettes. She has a 15.00 pack-year smoking history. she has never used smokeless tobacco. She reports that she does not drink alcohol or use drugs.  Tobacco Cessation: Former smoker with a 15 pack year smoking history, quit 1980  Past surgical hx, Family hx, Social hx all reviewed.  Current Outpatient Medications on File Prior to Visit  Medication Sig  . acyclovir cream (ZOVIRAX) 5 % Apply 1 application topically daily as needed. For  psoriasis  . amLODipine (NORVASC) 5 MG tablet Take 1 tablet (5 mg total) by mouth daily.  Marland Kitchen aspirin EC 81 MG tablet Take 1 tablet (81 mg total) by mouth daily.  . Blood Glucose Monitoring Suppl (ONE TOUCH ULTRA SYSTEM KIT) W/DEVICE KIT 1 kit by Does not apply route once. 250.02  . budesonide-formoterol (SYMBICORT) 80-4.5 MCG/ACT inhaler Inhale 2 puffs into the lungs 2 (two) times daily.  . clobetasol (TEMOVATE) 0.05 % ointment Use twice once daily to hand and feet as needed (Patient taking differently: Apply 1 application topically 2 (two) times daily. Use twice once daily to hand and feet as needed)  . fluocinolone (SYNALAR) 0.025 % ointment Apply topically 2 (two) times daily as needed. (Patient taking differently: Apply 1 application topically 2 (two) times daily as needed. For psoriasis)  . hydrochlorothiazide (HYDRODIURIL) 25 MG tablet Take 1 tablet (25 mg total) by mouth daily.  Marland Kitchen ketoconazole (NIZORAL) 2 % cream Apply topically 3 (three) times daily. use as directed two times a day as needed under breast  . loratadine-pseudoephedrine (CLARITIN-D 24-HOUR) 10-240 MG per 24 hr tablet Take 1 tablet by mouth daily.  Marland Kitchen lovastatin (MEVACOR) 40 MG tablet Take 2 tablets (80 mg total) by mouth daily.  . metFORMIN (GLUCOPHAGE-XR) 500 MG 24 hr tablet Take 1 tablet (500 mg total) by mouth daily.  . methimazole (TAPAZOLE) 5 MG tablet Take 1 tablet (5 mg total) by mouth daily.  . metoprolol tartrate (LOPRESSOR) 25 MG tablet TAKE 1 & 1/2 TABLETS TWICE A DAY  . naproxen (NAPROSYN) 500 MG tablet Take 1 tablet (500 mg total) by mouth 2 (two) times daily with a meal.  . omeprazole (PRILOSEC) 20 MG capsule TAKE 1 CAPSULE BY MOUTH EVERY DAY  . ONE TOUCH ULTRA TEST test strip TEST ONCE DAILY. E11.9  . ONETOUCH DELICA LANCETS 68G MISC USE TO CHECK BLOOD SUGAR ONCE DAILY  . tiZANidine (ZANAFLEX) 4 MG tablet Take 1 tablet (4 mg total) by mouth every 6 (six) hours as needed for muscle spasms.  . traMADol (ULTRAM) 50  MG tablet TAKE 1 TABELT BY MOUTH EVERY 6 HOURS AS NEEDED FOR PAIN   No current facility-administered medications on file prior to visit.      Allergies  Allergen Reactions  . Simvastatin Other (See Comments)    Muscle spasms    Review Of Systems:  Constitutional:   No  weight loss, night sweats,  Fevers, chills, fatigue, or  lassitude.  HEENT:   No headaches,  Difficulty swallowing,  Tooth/dental problems, or  Sore throat,                No sneezing, itching, ear ache, nasal congestion, post nasal drip,   CV:  No chest pain,  Orthopnea, PND, swelling in lower extremities,  anasarca, dizziness, palpitations, syncope.   GI  No heartburn, indigestion, abdominal pain, nausea, vomiting, diarrhea, change in bowel habits, loss of appetite, bloody stools.   Resp: + shortness of breath with exertion less at rest.  + excess mucus, + productive cough,  No non-productive cough,  No coughing up of blood.  + change in color of mucus.  + wheezing.  No chest wall deformity  Skin: no rash or lesions.  GU: no dysuria, change in color of urine, no urgency or frequency.  No flank pain, no hematuria   MS:  No joint pain or swelling.  No decreased range of motion.  No back pain.  Psych:  No change in mood or affect. No depression or anxiety.  No memory loss.   Vital Signs BP 110/64 (BP Location: Left Arm, Cuff Size: Normal)   Pulse 77   Ht '5\' 1"'  (1.549 m)   Wt 130 lb 3.2 oz (59.1 kg)   SpO2 99%   BMI 24.60 kg/m    Physical Exam:  General- No distress,  A&Ox3, pleasant ENT: No sinus tenderness, TM clear, pale nasal mucosa, no oral exudate,+ post nasal drip, no LAN Cardiac: S1, S2, regular rate and rhythm, no murmur Chest: No wheeze/ rales/ dullness; no accessory muscle use, no nasal flaring, no sternal retractions Abd.: Soft Non-tender,non-distended, Ext: No clubbing cyanosis, edema Neuro:  normal strength, alert and oriented x3, moving all extremities x4 Skin: No rashes, warm and  dry Psych: normal mood and behavior   Assessment/Plan  Asthma exacerbation Asthmatic exacerbation Plan Xopenex Neb treatment now. We will give you Depo Medrol 80 mg injection  IM today Prednisone taper; 10 mg tablets: 3 tabs x 2 days, 2 tabs x 2 days 1 tab x 2 days then stop. ( Start 3/7, Thursday) Doxycycline 100 mg twice daily x 7 days.with full glass of water Please take probiotic with antibiotic  Continue Symbicort 160 2 puffs twice daily Use your rescue inhaler as needed for breakthrough shortness of breath or wheezing. Mucinex 1200 mg with a full glass of water once daily to help with secretions. Hydromet cough syrup 5 cc's at bedtime for cough Delsym cough syrup every 12 hours as directed during the day. ( Non-sedating) Follow up with Dr. Elsworth Soho or Judson Roch NP in 2 weeks Please contact office for sooner follow up if symptoms do not improve or worsen or seek emergency care      Magdalen Spatz, NP 02/14/2018  9:24 PM

## 2018-02-14 NOTE — Telephone Encounter (Signed)
Error

## 2018-02-14 NOTE — Assessment & Plan Note (Signed)
Asthmatic exacerbation Plan Xopenex Neb treatment now. We will give you Depo Medrol 80 mg injection  IM today Prednisone taper; 10 mg tablets: 3 tabs x 2 days, 2 tabs x 2 days 1 tab x 2 days then stop. ( Start 3/7, Thursday) Doxycycline 100 mg twice daily x 7 days.with full glass of water Please take probiotic with antibiotic  Continue Symbicort 160 2 puffs twice daily Use your rescue inhaler as needed for breakthrough shortness of breath or wheezing. Mucinex 1200 mg with a full glass of water once daily to help with secretions. Hydromet cough syrup 5 cc's at bedtime for cough Delsym cough syrup every 12 hours as directed during the day. ( Non-sedating) Follow up with Dr. Elsworth Soho or Judson Roch NP in 2 weeks Please contact office for sooner follow up if symptoms do not improve or worsen or seek emergency care

## 2018-02-16 ENCOUNTER — Ambulatory Visit: Payer: Medicare Other | Admitting: Physical Therapy

## 2018-02-20 ENCOUNTER — Ambulatory Visit: Payer: Medicare Other | Attending: Family Medicine | Admitting: Physical Therapy

## 2018-02-20 ENCOUNTER — Encounter: Payer: Self-pay | Admitting: Physical Therapy

## 2018-02-20 DIAGNOSIS — M6289 Other specified disorders of muscle: Secondary | ICD-10-CM | POA: Insufficient documentation

## 2018-02-20 DIAGNOSIS — M546 Pain in thoracic spine: Secondary | ICD-10-CM | POA: Diagnosis not present

## 2018-02-20 DIAGNOSIS — M542 Cervicalgia: Secondary | ICD-10-CM | POA: Insufficient documentation

## 2018-02-20 NOTE — Therapy (Signed)
[facility] [place], [place], [address] Phone: [phone]   Fax:  [phone]  Physical Therapy Treatment  Patient Details  Name: [name] [name] MRN: [id number] Date of Birth: [birth year] Referring Provider: Raeford Razor   Encounter Date: 02/20/2018  PT End of Session - 02/20/18 1007    Visit Number  11    Date for PT Re-Evaluation  03/05/18    PT Start Time  0930    PT Stop Time  1010    PT Time Calculation (min)  40 min    Activity Tolerance  Patient tolerated treatment well    Behavior During Therapy  [facility] for tasks assessed/performed       Past Medical History:  Diagnosis Date  . ARTHRITIS 01/30/2008  . ASTHMA 09/03/2007  . 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 COLON CANCER 04/07/2008   pt states she had colon cancer 1987  . COPD 09/03/2007  . COPD (chronic obstructive pulmonary disease) ([place]) 04/22/2013  . CORONARY ARTERY DISEASE 09/03/2007  . DEGENERATIVE JOINT DISEASE 01/30/2008  . DIABETES MELLITUS 01/30/2008  . Diabetic peripheral neuropathy ([place]) 04/22/2013  . Eczema   . GERD 09/03/2007  . GLUCOSE INTOLERANCE 09/03/2007  . GOITER, MULTINODULAR 05/21/2010  . History of blood transfusion   . HYPERCHOLESTEROLEMIA 09/03/2007  . HYPERTENSION 09/03/2007  . Impaired glucose tolerance 04/10/2011  . LVH (left ventricular hypertrophy)   . OBESITY 09/03/2007  . Osteopenia   . Psoriasis   . Shortness of breath dyspnea    With exertion and allergies to pollen    Past Surgical History:  Procedure Laterality Date  . ABDOMINAL HYSTERECTOMY    . CARDIAC CATHETERIZATION    . COLON SURGERY    . FLOOR OF MOUTH BIOPSY N/A 12/24/2015   Procedure: ORAL BIOPSY;  Surgeon: Izora Gala, MD;  Location: [facility];  Service: ENT;  Laterality: N/A;  . HEMICOLECTOMY  12/1985   cancer  . HERNIA REPAIR    . MAXILLECTOMY Right 01/22/2016   Procedure: RIGHT SIDE PARTIAL MAXILLECTOMY;  Surgeon: Izora Gala, MD;  Location: [facility];   Service: ENT;  Laterality: Right;  . TUBAL LIGATION      There were no vitals filed for this visit.  Subjective Assessment - 02/20/18 0929    Subjective  "No too bad today" Pt stated that's he had some medial R knee pain last night    Currently in Pain?  Yes    Pain Score  2     Pain Location  Knee    Pain Orientation  Right;Medial         [facility] PT Assessment - 02/20/18 0001      AROM   Right Shoulder Flexion  168 Degrees    Right Shoulder ABduction  170 Degrees    Left Shoulder Flexion  171 Degrees    Left Shoulder ABduction  175 Degrees                  [facility] Adult PT Treatment/Exercise - 02/20/18 0001      Neck Exercises: Seated   Other Seated Exercise  shoulder extension with red tband 2x15, shoulder ER with red tband 2x15    Other Seated Exercise  seated row and lat 20# 2x10      Lumbar Exercises: Aerobic   UBE (Upper Arm Bike)  L4x6 mins (60fd/3back)    Nustep  L5x618ms      Lumbar Exercises: Machines for Strengthening  Cybex Knee Flexion  20lb 2x10       Lumbar Exercises: Standing   Row  Both;20 reps;Theraband    Theraband Level (Row)  Level 2 (Red)      Lumbar Exercises: Seated   Sit to Stand  10 reps x2 red ball OHP                PT Short Term Goals - 01/16/18 1014      PT SHORT TERM GOAL #1   Title  independent with initial HEP    Status  Achieved        PT Long Term Goals - 02/08/18 1002      PT LONG TERM GOAL #1   Title  increase cervical AROM 50% for functional use    Time  8    Period  Weeks    Status  Achieved      PT LONG TERM GOAL #2   Title  report no pain with ADLs for functional use    Status  Partially Met      PT LONG TERM GOAL #3   Title  reports singing in choir or doing solo performances with no pain or limitation     Status  Partially Met            Plan - 02/20/18 1008    Clinical Impression Statement  Pt reports that she feels better overall. Postural cues given throughout treatment. Pt  tends to lean back hanging on her HS, a possible indication of weak core muscles. Bilat shoulder ROM remains very well.     Rehab Potential  Good    PT Frequency  2x / week    PT Duration  8 weeks    PT Treatment/Interventions  Electrical Stimulation;Moist Heat;Therapeutic activities;Therapeutic exercise;Patient/family education;Manual techniques    PT Next Visit Plan  if doing as well next session decrease to 1 x week       Patient will benefit from skilled therapeutic intervention in order to improve the following deficits and impairments:  Pain, Increased muscle spasms, Impaired tone, Decreased strength, Decreased range of motion  Visit Diagnosis: Acute right-sided thoracic back pain  Muscle tone increased  Muscle tightness     Problem List Patient Active Problem List   Diagnosis Date Noted  . Acute right-sided thoracic back pain 12/28/2017  . Coronary artery calcification seen on CT scan 02/22/2017  . Microhematuria 11/10/2016  . Asthmatic bronchitis 11/10/2016  . Oral cancer ([place]) 01/22/2016  . Pulmonary nodule 01/15/2016  . Hyperthyroidism 05/07/2015  . Loss of weight 05/05/2015  . Appetite impaired 05/05/2015  . General weakness 05/05/2015  . UTI (urinary tract infection) 05/05/2015  . Hypokalemia 05/05/2015  . Abnormal TSH 05/05/2015  . Lower back pain 10/31/2014  . Dysuria 11/14/2013  . Diabetic peripheral neuropathy ([place]) 04/22/2013  . Hypersomnolence 06/14/2012  . Eczema 10/19/2011  . Preventative health care 04/10/2011  . GOITER, MULTINODULAR 05/21/2010  . OSTEOPENIA 04/07/2008  . COLON CANCER 04/07/2008  . Diabetes mellitus with neuropathy ([place]) 01/30/2008  . DEGENERATIVE JOINT DISEASE 01/30/2008  . ARTHRITIS 01/30/2008  . HYPERCHOLESTEROLEMIA 09/03/2007  . OBESITY 09/03/2007  . Essential hypertension 09/03/2007  . CORONARY ARTERY DISEASE 09/03/2007  . Allergic rhinitis 09/03/2007  . Asthma 09/03/2007  . GERD 09/03/2007  . Palpitations 09/03/2007     Scot Jun, PTA 02/20/2018, 10:10 AM  [facility], [place], [address] Phone: [phone]   Fax:  [phone]  Name: [name] [name] MRN: [id number] Date of Birth: 04/20/1946

## 2018-02-21 ENCOUNTER — Ambulatory Visit (INDEPENDENT_AMBULATORY_CARE_PROVIDER_SITE_OTHER): Payer: Medicare Other | Admitting: Endocrinology

## 2018-02-21 ENCOUNTER — Encounter: Payer: Self-pay | Admitting: Endocrinology

## 2018-02-21 VITALS — BP 128/72 | HR 73 | Ht 61.0 in | Wt 134.0 lb

## 2018-02-21 DIAGNOSIS — E0590 Thyrotoxicosis, unspecified without thyrotoxic crisis or storm: Secondary | ICD-10-CM | POA: Diagnosis not present

## 2018-02-21 LAB — TSH: TSH: 1.34 u[IU]/mL (ref 0.35–4.50)

## 2018-02-21 LAB — T4, FREE: FREE T4: 0.95 ng/dL (ref 0.60–1.60)

## 2018-02-21 NOTE — Patient Instructions (Signed)
Thyroid blood tests are requested for you today.  We'll let you know about the results.   Please come back for a follow-up appointment in 6 months.   if ever you have fever while taking methimazole, stop it and call us, because of the risk of a rare side-effect.    

## 2018-02-21 NOTE — Progress Notes (Signed)
Subjective:    Patient ID: [name] [name], female    DOB: [birth year], 72 y.o.   MRN: [id number]  HPI Pt returns for f/u of a small multinodular goiter and hyperthyroidism (dx'ed 2011; nodules have not met criteria for bx; f/u ultrasound in 2014 was unchanged; in 2017, CT showed normal thyroid; in 2016, TSH was more suppressed; she was offered RAI, but chose tapazole). since on the tapazole, pt states she feels better in general.  She takes tapazole 5 mg, 5 times per week.  Past Medical History:  Diagnosis Date  . ARTHRITIS 01/30/2008  . ASTHMA 09/03/2007  . Cancer ([place]) 1987   palate cancer  . COLON CANCER 04/07/2008   pt states she had colon cancer 1987  . COPD 09/03/2007  . COPD (chronic obstructive pulmonary disease) ([place]) 04/22/2013  . CORONARY ARTERY DISEASE 09/03/2007  . DEGENERATIVE JOINT DISEASE 01/30/2008  . DIABETES MELLITUS 01/30/2008  . Diabetic peripheral neuropathy ([place]) 04/22/2013  . Eczema   . GERD 09/03/2007  . GLUCOSE INTOLERANCE 09/03/2007  . GOITER, MULTINODULAR 05/21/2010  . History of blood transfusion   . HYPERCHOLESTEROLEMIA 09/03/2007  . HYPERTENSION 09/03/2007  . Impaired glucose tolerance 04/10/2011  . LVH (left ventricular hypertrophy)   . OBESITY 09/03/2007  . Osteopenia   . Psoriasis   . Shortness of breath dyspnea    With exertion and allergies to pollen    Past Surgical History:  Procedure Laterality Date  . ABDOMINAL HYSTERECTOMY    . CARDIAC CATHETERIZATION    . COLON SURGERY    . FLOOR OF MOUTH BIOPSY N/A 12/24/2015   Procedure: ORAL BIOPSY;  Surgeon: Izora Gala, MD;  Location: [facility];  Service: ENT;  Laterality: N/A;  . HEMICOLECTOMY  12/1985   cancer  . HERNIA REPAIR    . MAXILLECTOMY Right 01/22/2016   Procedure: RIGHT SIDE PARTIAL MAXILLECTOMY;  Surgeon: Izora Gala, MD;  Location: [facility];  Service: ENT;  Laterality: Right;  . TUBAL LIGATION      Social History   Socioeconomic History  . Marital status: Married    Spouse name: Not on file    . Number of children: 4  . Years of education: Not on file  . Highest education level: Not on file  Social Needs  . Financial resource strain: Not on file  . Food insecurity - worry: Not on file  . Food insecurity - inability: Not on file  . Transportation needs - medical: Not on file  . Transportation needs - non-medical: Not on file  Occupational History  . Occupation: stay at home    Employer: UNEMPLOYED  Tobacco Use  . Smoking status: Former Smoker    Packs/day: 1.00    Years: 15.00    Pack years: 15.00    Types: Cigarettes    Last attempt to quit: 12/12/1978    Years since quitting: 39.2  . Smokeless tobacco: Never Used  Substance and Sexual Activity  . Alcohol use: No  . Drug use: No  . Sexual activity: Not on file  Other Topics Concern  . Not on file  Social History Narrative   1 child died with meningitis    Current Outpatient Medications on File Prior to Visit  Medication Sig Dispense Refill  . acyclovir cream (ZOVIRAX) 5 % Apply 1 application topically daily as needed. For psoriasis    . amLODipine (NORVASC) 5 MG tablet Take 1 tablet (5 mg total) by mouth daily. 90 tablet 3  . aspirin EC 81 MG  tablet Take 1 tablet (81 mg total) by mouth daily. 90 tablet 11  . Blood Glucose Monitoring Suppl (ONE TOUCH ULTRA SYSTEM KIT) W/DEVICE KIT 1 kit by Does not apply route once. 250.02 1 each 0  . budesonide-formoterol (SYMBICORT) 80-4.5 MCG/ACT inhaler Inhale 2 puffs into the lungs 2 (two) times daily. 1 Inhaler 4  . clobetasol (TEMOVATE) 0.05 % ointment Use twice once daily to hand and feet as needed (Patient taking differently: Apply 1 application topically 2 (two) times daily. Use twice once daily to hand and feet as needed) 30 g 1  . doxycycline (VIBRA-TABS) 100 MG tablet Take 1 tablet (100 mg total) by mouth 2 (two) times daily. 14 tablet 0  . fluocinolone (SYNALAR) 0.025 % ointment Apply topically 2 (two) times daily as needed. (Patient taking differently: Apply 1  application topically 2 (two) times daily as needed. For psoriasis) 30 g 1  . hydrochlorothiazide (HYDRODIURIL) 25 MG tablet Take 1 tablet (25 mg total) by mouth daily. 90 tablet 3  . HYDROcodone-homatropine (HYCODAN) 5-1.5 MG/5ML syrup Take 4m at bedtime as needed for cough. 240 mL 0  . ketoconazole (NIZORAL) 2 % cream Apply topically 3 (three) times daily. use as directed two times a day as needed under breast 15 g 3  . loratadine-pseudoephedrine (CLARITIN-D 24-HOUR) 10-240 MG per 24 hr tablet Take 1 tablet by mouth daily.    .Marland Kitchenlovastatin (MEVACOR) 40 MG tablet Take 2 tablets (80 mg total) by mouth daily. 180 tablet 3  . metFORMIN (GLUCOPHAGE-XR) 500 MG 24 hr tablet Take 1 tablet (500 mg total) by mouth daily. 90 tablet 3  . metoprolol tartrate (LOPRESSOR) 25 MG tablet TAKE 1 & 1/2 TABLETS TWICE A DAY 270 tablet 3  . naproxen (NAPROSYN) 500 MG tablet Take 1 tablet (500 mg total) by mouth 2 (two) times daily with a meal. 60 tablet 2  . omeprazole (PRILOSEC) 20 MG capsule TAKE 1 CAPSULE BY MOUTH EVERY DAY 90 capsule 3  . ONE TOUCH ULTRA TEST test strip TEST ONCE DAILY. E11.9 100 each 2  . predniSONE (DELTASONE) 10 MG tablet 3 tabs x 2 days, 2 tabs x 2 days, 1 tab x2 days and then stop. 14 tablet 0  . tiZANidine (ZANAFLEX) 4 MG tablet Take 1 tablet (4 mg total) by mouth every 6 (six) hours as needed for muscle spasms. 60 tablet 1  . traMADol (ULTRAM) 50 MG tablet TAKE 1 TABELT BY MOUTH EVERY 6 HOURS AS NEEDED FOR PAIN 120 tablet 1   No current facility-administered medications on file prior to visit.     Allergies  Allergen Reactions  . Simvastatin Other (See Comments)    Muscle spasms    Family History  Problem Relation Age of Onset  . Liver disease Father   . Heart disease Mother   . Colon cancer Paternal Aunt   . Thyroid disease Sister        overactive  . Cancer Sister        lung, adrenal gland  . Thyroid disease Brother        overactive  . Colon cancer Other        aunt  .  Cancer Other        Colon Cancer-Aunt  . Breast cancer Neg Hx     BP 128/72 (BP Location: Left Arm, Patient Position: Sitting, Cuff Size: Normal)   Pulse 73   Ht _0  (1.549 m)   Wt 134 lb (60.8 kg)   SpO2  99%   BMI 25.32 kg/m    Review of Systems Denies fever    Objective:   Physical Exam VITAL SIGNS:  See vs page GENERAL: no distress NECK: I can palpate a right thyroid nodule (longitudonal, 2x1 cm).      Assessment & Plan:  Hyperthyroidism: due for recheck  Patient Instructions  Thyroid blood tests are requested for you today.  We'll let you know about the results.   Please come back for a follow-up appointment in 6 months.   if ever you have fever while taking methimazole, stop it and call us, because of the risk of a rare side-effect.

## 2018-02-22 ENCOUNTER — Ambulatory Visit: Payer: Medicare Other | Admitting: Physical Therapy

## 2018-02-22 ENCOUNTER — Other Ambulatory Visit: Payer: Self-pay

## 2018-02-22 DIAGNOSIS — M546 Pain in thoracic spine: Secondary | ICD-10-CM

## 2018-02-22 DIAGNOSIS — M542 Cervicalgia: Secondary | ICD-10-CM | POA: Diagnosis not present

## 2018-02-22 DIAGNOSIS — M6289 Other specified disorders of muscle: Secondary | ICD-10-CM

## 2018-02-22 MED ORDER — METHIMAZOLE 5 MG PO TABS: 5.0000 mg | ORAL_TABLET | Freq: Every day | ORAL | 1 refills | Status: DC

## 2018-02-22 NOTE — Therapy (Signed)
[facility] [place], [place], [address] Phone: [phone]   Fax:  [phone]  Physical Therapy Treatment  Patient Details  Name: [name] [name] MRN: [id number] Date of Birth: May 20, 1946 Referring Provider: Raeford Razor   Encounter Date: 02/22/2018  PT End of Session - 02/22/18 0930    Visit Number  12    Date for PT Re-Evaluation  03/05/18    PT Start Time  0925    PT Stop Time  1008    PT Time Calculation (min)  43 min       Past Medical History:  Diagnosis Date  . ARTHRITIS 01/30/2008  . ASTHMA 09/03/2007  . Cancer ([place]) 1987   palate cancer  . COLON CANCER 04/07/2008   pt states she had colon cancer 1987  . COPD 09/03/2007  . COPD (chronic obstructive pulmonary disease) ([place]) 04/22/2013  . CORONARY ARTERY DISEASE 09/03/2007  . DEGENERATIVE JOINT DISEASE 01/30/2008  . DIABETES MELLITUS 01/30/2008  . Diabetic peripheral neuropathy ([place]) 04/22/2013  . Eczema   . GERD 09/03/2007  . GLUCOSE INTOLERANCE 09/03/2007  . GOITER, MULTINODULAR 05/21/2010  . History of blood transfusion   . HYPERCHOLESTEROLEMIA 09/03/2007  . HYPERTENSION 09/03/2007  . Impaired glucose tolerance 04/10/2011  . LVH (left ventricular hypertrophy)   . OBESITY 09/03/2007  . Osteopenia   . Psoriasis   . Shortness of breath dyspnea    With exertion and allergies to pollen    Past Surgical History:  Procedure Laterality Date  . ABDOMINAL HYSTERECTOMY    . CARDIAC CATHETERIZATION    . COLON SURGERY    . FLOOR OF MOUTH BIOPSY N/A 12/24/2015   Procedure: ORAL BIOPSY;  Surgeon: Izora Gala, MD;  Location: [facility];  Service: ENT;  Laterality: N/A;  . HEMICOLECTOMY  12/1985   cancer  . HERNIA REPAIR    . MAXILLECTOMY Right 01/22/2016   Procedure: RIGHT SIDE PARTIAL MAXILLECTOMY;  Surgeon: Izora Gala, MD;  Location: [facility];  Service: ENT;  Laterality: Right;  . TUBAL LIGATION      There were no vitals filed for this visit.  Subjective  Assessment - 02/22/18 0932    Subjective  feeling pretty good today    Currently in Pain?  No/denies                      [facility] Adult PT Treatment/Exercise - 02/22/18 0001      Lumbar Exercises: Aerobic   UBE (Upper Arm Bike)  L 4 3 fwd/3back    Nustep  L5x39mns      Lumbar Exercises: Machines for Strengthening   Cybex Lumbar Extension  black tband 2x10 trunk flex black band 2 sets 10    Cybex Knee Flexion  20lb 3x10     Other Lumbar Machine Exercise  lat pull and row 2 sets 10 20#      Lumbar Exercises: Standing   Other Standing Lumbar Exercises  resisted gait fwd/back 5 times, 3 times each side      Lumbar Exercises: Supine   Ab Set  15 reps;3 seconds               PT Short Term Goals - 01/16/18 1014      PT SHORT TERM GOAL #1   Title  independent with initial HEP    Status  Achieved        PT Long Term Goals - 02/22/18 02706  PT LONG TERM GOAL #1   Title  increase cervical AROM 50% for functional use    Status  Achieved      PT LONG TERM GOAL #2   Title  report no pain with ADLs for functional use    Baseline  reports worked in yard last week and flared up some    Status  Partially Met      PT [place] #3   Title  reports singing in choir or doing solo performances with no pain or limitation     Status  Achieved            Plan - 02/22/18 0954    Clinical Impression Statement  progressing with goals . abdominal and hip weakness noted with addition of resisted gait and resited trunk flexion. pt fearful of flare ups with return to gardening- explained we will D/C 3/25 and if when she resumes normal activity if increased pain we can address. Postural cuing throughout ex.    PT Treatment/Interventions  Electrical Stimulation;Moist Heat;Therapeutic activities;Therapeutic exercise;Patient/family education;Manual techniques    PT Next Visit Plan  core strength in prep. for 3/25 D/C       Patient will benefit from skilled  therapeutic intervention in order to improve the following deficits and impairments:  Pain, Increased muscle spasms, Impaired tone, Decreased strength, Decreased range of motion  Visit Diagnosis: Acute right-sided thoracic back pain  Muscle tone increased  Muscle tightness  Neck pain     Problem List Patient Active Problem List   Diagnosis Date Noted  . Acute right-sided thoracic back pain 12/28/2017  . Coronary artery calcification seen on CT scan 02/22/2017  . Microhematuria 11/10/2016  . Asthmatic bronchitis 11/10/2016  . Oral cancer ([place]) 01/22/2016  . Pulmonary nodule 01/15/2016  . Hyperthyroidism 05/07/2015  . Loss of weight 05/05/2015  . Appetite impaired 05/05/2015  . General weakness 05/05/2015  . UTI (urinary tract infection) 05/05/2015  . Hypokalemia 05/05/2015  . Abnormal TSH 05/05/2015  . Lower back pain 10/31/2014  . Dysuria 11/14/2013  . Diabetic peripheral neuropathy ([place]) 04/22/2013  . Hypersomnolence 06/14/2012  . Eczema 10/19/2011  . Preventative health care 04/10/2011  . GOITER, MULTINODULAR 05/21/2010  . OSTEOPENIA 04/07/2008  . COLON CANCER 04/07/2008  . Diabetes mellitus with neuropathy ([place]) 01/30/2008  . DEGENERATIVE JOINT DISEASE 01/30/2008  . ARTHRITIS 01/30/2008  . HYPERCHOLESTEROLEMIA 09/03/2007  . OBESITY 09/03/2007  . Essential hypertension 09/03/2007  . CORONARY ARTERY DISEASE 09/03/2007  . Allergic rhinitis 09/03/2007  . Asthma 09/03/2007  . GERD 09/03/2007  . Palpitations 09/03/2007    Dontarius Sheley,ANGIE PTA 02/22/2018, 10:02 AM  [place], [place], [address] Phone: [phone]   Fax:  [phone]  Name: [name] [name] MRN: [id number] Date of Birth: 03-Oct-1946

## 2018-02-23 ENCOUNTER — Other Ambulatory Visit: Payer: Self-pay

## 2018-02-26 ENCOUNTER — Encounter: Payer: Self-pay | Admitting: Acute Care

## 2018-02-26 ENCOUNTER — Other Ambulatory Visit: Payer: Self-pay | Admitting: Internal Medicine

## 2018-02-26 ENCOUNTER — Ambulatory Visit (INDEPENDENT_AMBULATORY_CARE_PROVIDER_SITE_OTHER): Payer: Medicare Other | Admitting: Acute Care

## 2018-02-26 ENCOUNTER — Encounter: Payer: Medicare Other | Admitting: Physical Therapy

## 2018-02-26 DIAGNOSIS — Z1231 Encounter for screening mammogram for malignant neoplasm of breast: Secondary | ICD-10-CM

## 2018-02-26 DIAGNOSIS — J45909 Unspecified asthma, uncomplicated: Secondary | ICD-10-CM

## 2018-02-26 MED ORDER — MONTELUKAST SODIUM 10 MG PO TABS: 10.0000 mg | ORAL_TABLET | Freq: Every day | ORAL | 5 refills | Status: DC

## 2018-02-26 MED ORDER — HYDROCODONE-HOMATROPINE 5-1.5 MG/5ML PO SYRP: 5.0000 mL | ORAL_SOLUTION | Freq: Four times a day (QID) | ORAL | 0 refills | Status: DC | PRN

## 2018-02-26 NOTE — Patient Instructions (Addendum)
It is good to see you today. We are glad you are feeling better. Continue Symbicort 160 2 puffs twice daily Rinse mouth after use Use your rescue inhaler as needed for breakthrough shortness of breath or wheezing. Hydromet cough syrup 5 cc's at bedtime for cough as needed Delsym cough syrup every 12 hours as directed during the day. ( Non-sedating) We will add Singulair daily for continued allergies. Follow up with Tammy 01/14/2019 as is scheduled. Please contact office for sooner follow up if symptoms do not improve or worsen or seek emergency care

## 2018-02-26 NOTE — Progress Notes (Signed)
History of Present Illness [name] [name] is a 72 y.o. female   AAF, former  Smoker ( 15 pack year smoking history, quit 1980)  with asthmatic bronchitis, GERD and a component of VCD. Additional history of oral cancer ( Tx. With excision 2017) , and incidental finding of lung nodule on CT. She is followed by Dr. Elsworth Soho.    02/26/2018 Pt. Present for follow up.She states she is doing much better. She was compliant with her Doxycycline and prednisone taper. She has been using her cough syrup. She has resumed her Symbicort 160 mg.She states she does still have a runny nose, but she feels that is allergies. She is already taking Claritin daily.She is compliant with her maintenance medications.She denies fever, chest pain, orthopnea or hemoptysis.  Test Results:  CBC Latest Ref Rng & Units 11/13/2017 11/04/2016 02/08/2016  WBC 4.0 - 10.5 K/uL 6.6 7.7 8.4  Hemoglobin 12.0 - 15.0 g/dL 11.9(L) 11.6(L) 10.9(L)  Hematocrit 36.0 - 46.0 % 37.4 35.6(L) 34.1(L)  Platelets 150.0 - 400.0 K/uL 223.0 203.0 378    BMP Latest Ref Rng & Units 11/13/2017 05/11/2017 11/04/2016  Glucose 70 - 99 mg/dL 78 90 84  BUN 6 - 23 mg/dL _0 Creatinine 0.40 - 1.20 mg/dL 0.77 0.81 0.79  Sodium 135 - 145 mEq/L 142 142 144  Potassium 3.5 - 5.1 mEq/L 3.7 4.3 3.9  Chloride 96 - 112 mEq/L 103 105 106  CO2 19 - 32 mEq/L 30 32 30  Calcium 8.4 - 10.5 mg/dL 9.5 9.3 9.2    BNP No results found for: BNP  ProBNP No results found for: PROBNP  PFT No results found for: FEV1PRE, FEV1POST, FVCPRE, FVCPOST, TLC, DLCOUNC, PREFEV1FVCRT, PSTFEV1FVCRT  No results found.   Past medical hx Past Medical History:  Diagnosis Date  . ARTHRITIS 01/30/2008  . ASTHMA 09/03/2007  . Cancer ([place]) 1987   palate cancer  . COLON CANCER 04/07/2008   pt states she had colon cancer 1987  . COPD 09/03/2007  . COPD (chronic obstructive pulmonary disease) ([place]) 04/22/2013  . CORONARY ARTERY DISEASE 09/03/2007  . DEGENERATIVE JOINT DISEASE  01/30/2008  . DIABETES MELLITUS 01/30/2008  . Diabetic peripheral neuropathy ([place]) 04/22/2013  . Eczema   . GERD 09/03/2007  . GLUCOSE INTOLERANCE 09/03/2007  . GOITER, MULTINODULAR 05/21/2010  . History of blood transfusion   . HYPERCHOLESTEROLEMIA 09/03/2007  . HYPERTENSION 09/03/2007  . Impaired glucose tolerance 04/10/2011  . LVH (left ventricular hypertrophy)   . OBESITY 09/03/2007  . Osteopenia   . Psoriasis   . Shortness of breath dyspnea    With exertion and allergies to pollen     Social History   Tobacco Use  . Smoking status: Former Smoker    Packs/day: 1.00    Years: 15.00    Pack years: 15.00    Types: Cigarettes    Last attempt to quit: 12/12/1978    Years since quitting: 39.2  . Smokeless tobacco: Never Used  Substance Use Topics  . Alcohol use: No  . Drug use: No    [name] [name] reports that she quit smoking about 39 years ago. Her smoking use included cigarettes. She has a 15.00 pack-year smoking history. she has never used smokeless tobacco. She reports that she does not drink alcohol or use drugs.  Tobacco Cessation: Former smoker quit 1980, with a 15 pack year smoking history.  Past surgical hx, Family hx, Social hx all reviewed.  Current Outpatient Medications on File Prior to Visit  Medication Sig  . acyclovir cream (ZOVIRAX) 5 % Apply 1 application topically daily as needed. For psoriasis  . amLODipine (NORVASC) 5 MG tablet Take 1 tablet (5 mg total) by mouth daily.  Marland Kitchen aspirin EC 81 MG tablet Take 1 tablet (81 mg total) by mouth daily.  . Blood Glucose Monitoring Suppl (ONE TOUCH ULTRA SYSTEM KIT) W/DEVICE KIT 1 kit by Does not apply route once. 250.02  . budesonide-formoterol (SYMBICORT) 80-4.5 MCG/ACT inhaler Inhale 2 puffs into the lungs 2 (two) times daily.  . clobetasol (TEMOVATE) 0.05 % ointment Use twice once daily to hand and feet as needed (Patient taking differently: Apply 1 application topically 2 (two) times daily. Use twice once daily to hand and  feet as needed)  . fluocinolone (SYNALAR) 0.025 % ointment Apply topically 2 (two) times daily as needed. (Patient taking differently: Apply 1 application topically 2 (two) times daily as needed. For psoriasis)  . hydrochlorothiazide (HYDRODIURIL) 25 MG tablet Take 1 tablet (25 mg total) by mouth daily.  Marland Kitchen ketoconazole (NIZORAL) 2 % cream Apply topically 3 (three) times daily. use as directed two times a day as needed under breast  . loratadine-pseudoephedrine (CLARITIN-D 24-HOUR) 10-240 MG per 24 hr tablet Take 1 tablet by mouth daily.  Marland Kitchen lovastatin (MEVACOR) 40 MG tablet Take 2 tablets (80 mg total) by mouth daily.  . metFORMIN (GLUCOPHAGE-XR) 500 MG 24 hr tablet Take 1 tablet (500 mg total) by mouth daily.  . methimazole (TAPAZOLE) 5 MG tablet Take 1 tablet (5 mg total) by mouth daily.  . metoprolol tartrate (LOPRESSOR) 25 MG tablet TAKE 1 & 1/2 TABLETS TWICE A DAY  . naproxen (NAPROSYN) 500 MG tablet Take 1 tablet (500 mg total) by mouth 2 (two) times daily with a meal.  . omeprazole (PRILOSEC) 20 MG capsule TAKE 1 CAPSULE BY MOUTH EVERY DAY  . ONE TOUCH ULTRA TEST test strip TEST ONCE DAILY. E11.9  . tiZANidine (ZANAFLEX) 4 MG tablet Take 1 tablet (4 mg total) by mouth every 6 (six) hours as needed for muscle spasms.  . traMADol (ULTRAM) 50 MG tablet TAKE 1 TABELT BY MOUTH EVERY 6 HOURS AS NEEDED FOR PAIN   No current facility-administered medications on file prior to visit.      Allergies  Allergen Reactions  . Simvastatin Other (See Comments)    Muscle spasms    Review Of Systems:  Constitutional:   No  weight loss, night sweats,  Fevers, chills, fatigue, or  lassitude.  HEENT:   No headaches,  Difficulty swallowing,  Tooth/dental problems, or  Sore throat,                No sneezing, itching, ear ache, nasal congestion, +post nasal drip,   CV:  No chest pain,  Orthopnea, PND, swelling in lower extremities, anasarca, dizziness, palpitations, syncope.   GI  No heartburn,  indigestion, abdominal pain, nausea, vomiting, diarrhea, change in bowel habits, loss of appetite, bloody stools.   Resp: No shortness of breath with exertion or at rest.  No excess mucus, no productive cough,  Occasional  non-productive cough,  No coughing up of blood.  No change in color of mucus.  No wheezing.  No chest wall deformity  Skin: no rash or lesions.  GU: no dysuria, change in color of urine, no urgency or frequency.  No flank pain, no hematuria   MS:  No joint pain or swelling.  No decreased range of motion.  No back pain.  Psych:  No  change in mood or affect. No depression or anxiety.  No memory loss.   Vital Signs BP 128/72 (BP Location: Left Arm, Cuff Size: Normal)   Pulse 78   Ht 5' 1" (1.549 m)   Wt 133 lb 9.6 oz (60.6 kg)   SpO2 99%   BMI 25.24 kg/m    Physical Exam:  General- No distress,  A&Ox3, pleasant ENT: No sinus tenderness, TM clear, pale nasal mucosa, no oral exudate,no post nasal drip, no LAN Cardiac: S1, S2, regular rate and rhythm, no murmur Chest: No wheeze/ rales/ dullness; no accessory muscle use, no nasal flaring, no sternal retractions Abd.: Soft Non-tender, ND, BS + Ext: No clubbing cyanosis, edema Neuro:  normal strength Skin: No rashes, warm and dry Psych: normal mood and behavior   Assessment/Plan  Asthma Resolved Flare Treated with Doxycycline and Prednisone Plan: Continue Symbicort 160 2 puffs twice daily Rinse mouth after use Use your rescue inhaler as needed for breakthrough shortness of breath or wheezing. Hydromet cough syrup 5 cc's at bedtime for cough as needed Delsym cough syrup every 12 hours as directed during the day. ( Non-sedating) We will add Singulair daily for continued allergies. Follow up with Tammy 01/14/2019 as is scheduled. Please contact office for sooner follow up if symptoms do not improve or worsen or seek emergency care      Magdalen Spatz, NP 02/26/2018  11:52 AM

## 2018-02-26 NOTE — Assessment & Plan Note (Signed)
Resolved Flare Treated with Doxycycline and Prednisone Plan: Continue Symbicort 160 2 puffs twice daily Rinse mouth after use Use your rescue inhaler as needed for breakthrough shortness of breath or wheezing. Hydromet cough syrup 5 cc's at bedtime for cough as needed Delsym cough syrup every 12 hours as directed during the day. ( Non-sedating) We will add Singulair daily for continued allergies. Follow up with [name] [name] 01/14/2019 as is scheduled. Please contact office for sooner follow up if symptoms do not improve or worsen or seek emergency care

## 2018-03-01 ENCOUNTER — Encounter: Payer: Self-pay | Admitting: Physical Therapy

## 2018-03-01 ENCOUNTER — Ambulatory Visit: Payer: Medicare Other | Admitting: Physical Therapy

## 2018-03-01 DIAGNOSIS — M546 Pain in thoracic spine: Secondary | ICD-10-CM | POA: Diagnosis not present

## 2018-03-01 DIAGNOSIS — M542 Cervicalgia: Secondary | ICD-10-CM | POA: Diagnosis not present

## 2018-03-01 DIAGNOSIS — M6289 Other specified disorders of muscle: Secondary | ICD-10-CM | POA: Diagnosis not present

## 2018-03-01 NOTE — Therapy (Signed)
[facility] [place], [place], [address] Phone: [phone]   Fax:  [phone]  Physical Therapy Treatment  Patient Details  Name: [name] [name] MRN: [id number] Date of Birth: Jul 16, 1946 Referring Provider: Raeford Razor   Encounter Date: 03/01/2018  PT End of Session - 03/01/18 1415    Visit Number  13    Date for PT Re-Evaluation  03/05/18    Activity Tolerance  Patient tolerated treatment well    Behavior During Therapy  [facility] for tasks assessed/performed       Past Medical History:  Diagnosis Date  . ARTHRITIS 01/30/2008  . ASTHMA 09/03/2007  . Cancer ([place]) 1987   palate cancer  . COLON CANCER 04/07/2008   pt states she had colon cancer 1987  . COPD 09/03/2007  . COPD (chronic obstructive pulmonary disease) ([place]) 04/22/2013  . CORONARY ARTERY DISEASE 09/03/2007  . DEGENERATIVE JOINT DISEASE 01/30/2008  . DIABETES MELLITUS 01/30/2008  . Diabetic peripheral neuropathy ([place]) 04/22/2013  . Eczema   . GERD 09/03/2007  . GLUCOSE INTOLERANCE 09/03/2007  . GOITER, MULTINODULAR 05/21/2010  . History of blood transfusion   . HYPERCHOLESTEROLEMIA 09/03/2007  . HYPERTENSION 09/03/2007  . Impaired glucose tolerance 04/10/2011  . LVH (left ventricular hypertrophy)   . OBESITY 09/03/2007  . Osteopenia   . Psoriasis   . Shortness of breath dyspnea    With exertion and allergies to pollen    Past Surgical History:  Procedure Laterality Date  . ABDOMINAL HYSTERECTOMY    . CARDIAC CATHETERIZATION    . COLON SURGERY    . FLOOR OF MOUTH BIOPSY N/A 12/24/2015   Procedure: ORAL BIOPSY;  Surgeon: Izora Gala, MD;  Location: [facility];  Service: ENT;  Laterality: N/A;  . HEMICOLECTOMY  12/1985   cancer  . HERNIA REPAIR    . MAXILLECTOMY Right 01/22/2016   Procedure: RIGHT SIDE PARTIAL MAXILLECTOMY;  Surgeon: Izora Gala, MD;  Location: [facility];  Service: ENT;  Laterality: Right;  . TUBAL LIGATION      There were no vitals filed  for this visit.      [facility] PT Assessment - 03/01/18 0001      AROM   Right Shoulder Flexion  168 Degrees    Right Shoulder ABduction  170 Degrees    Left Shoulder Flexion  170 Degrees    Left Shoulder ABduction  175 Degrees                  [facility] Adult PT Treatment/Exercise - 03/01/18 0001      Ambulation/Gait   Ambulation Distance (Feet)  150 Feet x6    Gait Pattern  Step-through pattern;Poor foot clearance - left;Poor foot clearance - right    Gait Comments  Pt instructed in heel to toe gait pattern, forward marching, side stepping, walking hamstring curls. Pt had more diffiiculty with cordination when performing forward porgression hamstring curls.       Lumbar Exercises: Aerobic   UBE (Upper Arm Bike)  L 4 3 fwd/3back    Nustep  L5x61mns      Lumbar Exercises: Machines for Strengthening   Cybex Knee Flexion  20lb 3x10     Other Lumbar Machine Exercise  lat pull and row 2 sets 10 20#             PT Education - 03/01/18 1414    Education provided  Yes    Education Details  heel to gait pattern to increase  foot clearence    Methods  Explanation;Demonstration;Verbal cues    Comprehension  Verbalized understanding;Returned demonstration       PT Short Term Goals - 01/16/18 1014      PT SHORT TERM GOAL #1   Title  independent with initial HEP    Status  Achieved        PT Long Term Goals - 02/22/18 0947      PT LONG TERM GOAL #1   Title  increase cervical AROM 50% for functional use    Status  Achieved      PT LONG TERM GOAL #2   Title  report no pain with ADLs for functional use    Baseline  reports worked in yard last week and flared up some    Status  Partially Met      PT [place] #3   Title  reports singing in choir or doing solo performances with no pain or limitation     Status  Achieved              Patient will benefit from skilled therapeutic intervention in order to improve the following deficits and impairments:      Visit Diagnosis: Acute right-sided thoracic back pain  Muscle tightness  Neck pain  Muscle tone increased     Problem List Patient Active Problem List   Diagnosis Date Noted  . Acute right-sided thoracic back pain 12/28/2017  . Coronary artery calcification seen on CT scan 02/22/2017  . Microhematuria 11/10/2016  . Asthmatic bronchitis 11/10/2016  . Oral cancer ([place]) 01/22/2016  . Pulmonary nodule 01/15/2016  . Hyperthyroidism 05/07/2015  . Loss of weight 05/05/2015  . Appetite impaired 05/05/2015  . General weakness 05/05/2015  . UTI (urinary tract infection) 05/05/2015  . Hypokalemia 05/05/2015  . Abnormal TSH 05/05/2015  . Lower back pain 10/31/2014  . Dysuria 11/14/2013  . Diabetic peripheral neuropathy ([place]) 04/22/2013  . Hypersomnolence 06/14/2012  . Eczema 10/19/2011  . Preventative health care 04/10/2011  . GOITER, MULTINODULAR 05/21/2010  . OSTEOPENIA 04/07/2008  . COLON CANCER 04/07/2008  . Diabetes mellitus with neuropathy ([place]) 01/30/2008  . DEGENERATIVE JOINT DISEASE 01/30/2008  . ARTHRITIS 01/30/2008  . HYPERCHOLESTEROLEMIA 09/03/2007  . OBESITY 09/03/2007  . Essential hypertension 09/03/2007  . CORONARY ARTERY DISEASE 09/03/2007  . Allergic rhinitis 09/03/2007  . Asthma 09/03/2007  . GERD 09/03/2007  . Palpitations 09/03/2007    Oretha Caprice, MPT 03/01/2018, 2:16 PM  [facility] [place], [place], [address] Phone: [phone]   Fax:  [phone]  Name: [name] [name] MRN: [id number] Date of Birth: 02-02-1946

## 2018-03-05 ENCOUNTER — Ambulatory Visit: Payer: Medicare Other | Admitting: Physical Therapy

## 2018-03-05 DIAGNOSIS — M542 Cervicalgia: Secondary | ICD-10-CM | POA: Diagnosis not present

## 2018-03-05 DIAGNOSIS — M546 Pain in thoracic spine: Secondary | ICD-10-CM | POA: Diagnosis not present

## 2018-03-05 DIAGNOSIS — M6289 Other specified disorders of muscle: Secondary | ICD-10-CM | POA: Diagnosis not present

## 2018-03-05 NOTE — Therapy (Signed)
[facility] [place], [place], [address] Phone: [phone]   Fax:  [phone]  Physical Therapy Treatment  Patient Details  Name: [name] [name] MRN: [id number] Date of Birth: 02-26-1946 Referring Provider: Raeford Razor   Encounter Date: 03/05/2018  PT End of Session - 03/05/18 1051    Visit Number  14    Date for PT Re-Evaluation  03/05/18    PT Start Time  1005    PT Stop Time  1052    PT Time Calculation (min)  47 min       Past Medical History:  Diagnosis Date  . ARTHRITIS 01/30/2008  . ASTHMA 09/03/2007  . Cancer ([place]) 1987   palate cancer  . COLON CANCER 04/07/2008   pt states she had colon cancer 1987  . COPD 09/03/2007  . COPD (chronic obstructive pulmonary disease) ([place]) 04/22/2013  . CORONARY ARTERY DISEASE 09/03/2007  . DEGENERATIVE JOINT DISEASE 01/30/2008  . DIABETES MELLITUS 01/30/2008  . Diabetic peripheral neuropathy ([place]) 04/22/2013  . Eczema   . GERD 09/03/2007  . GLUCOSE INTOLERANCE 09/03/2007  . GOITER, MULTINODULAR 05/21/2010  . History of blood transfusion   . HYPERCHOLESTEROLEMIA 09/03/2007  . HYPERTENSION 09/03/2007  . Impaired glucose tolerance 04/10/2011  . LVH (left ventricular hypertrophy)   . OBESITY 09/03/2007  . Osteopenia   . Psoriasis   . Shortness of breath dyspnea    With exertion and allergies to pollen    Past Surgical History:  Procedure Laterality Date  . ABDOMINAL HYSTERECTOMY    . CARDIAC CATHETERIZATION    . COLON SURGERY    . FLOOR OF MOUTH BIOPSY N/A 12/24/2015   Procedure: ORAL BIOPSY;  Surgeon: Izora Gala, MD;  Location: [facility];  Service: ENT;  Laterality: N/A;  . HEMICOLECTOMY  12/1985   cancer  . HERNIA REPAIR    . MAXILLECTOMY Right 01/22/2016   Procedure: RIGHT SIDE PARTIAL MAXILLECTOMY;  Surgeon: Izora Gala, MD;  Location: [place];  Service: ENT;  Laterality: Right;  . TUBAL LIGATION      There were no vitals filed for this visit.  Subjective  Assessment - 03/05/18 1011    Subjective  worked in yard 2 times over weekend and did well    Currently in Pain?  No/denies                No data recorded       [facility] Adult PT Treatment/Exercise - 03/05/18 0001      Lumbar Exercises: Aerobic   UBE (Upper Arm Bike)  L 4 3 fwd/3back    Nustep  L5x 77mns      Lumbar Exercises: Machines for Strengthening   Cybex Knee Flexion  20lb 2 sets 15 abs with ball 15 times    Other Lumbar Machine Exercise  lat pull and row 2 sets 15 20#      Lumbar Exercises: Standing   Other Standing Lumbar Exercises  resisted gait 4 way black tband    Other Standing Lumbar Exercises  obl on airex 15 each with pulley      Lumbar Exercises: Seated   Sit to Stand  20 reps wt ball toss               PT Short Term Goals - 01/16/18 1014      PT SHORT TERM GOAL #1   Title  independent with initial HEP    Status  Achieved  PT Long Term Goals - 03/05/18 1014      PT LONG TERM GOAL #1   Title  increase cervical AROM 50% for functional use    Status  Achieved      PT LONG TERM GOAL #2   Title  report no pain with ADLs for functional use    Baseline  pt reports a few flare ups over last few weeks with activity but last week doing well    Status  Achieved      PT LONG TERM GOAL #3   Title  reports singing in choir or doing solo performances with no pain or limitation     Status  Achieved            Plan - 03/05/18 1052    Clinical Impression Statement  all goals met except some flare ups in past few weeks but overll doing great and rec resuming all activities    PT Treatment/Interventions  Electrical Stimulation;Moist Heat;Therapeutic activities;Therapeutic exercise;Patient/family education;Manual techniques    PT Next Visit Plan  D/C       Patient will benefit from skilled therapeutic intervention in order to improve the following deficits and impairments:  Pain, Increased muscle spasms, Impaired tone, Decreased  strength, Decreased range of motion  Visit Diagnosis: Muscle tightness  Neck pain  Muscle tone increased     Problem List Patient Active Problem List   Diagnosis Date Noted  . Acute right-sided thoracic back pain 12/28/2017  . Coronary artery calcification seen on CT scan 02/22/2017  . Microhematuria 11/10/2016  . Asthmatic bronchitis 11/10/2016  . Oral cancer ([place]) 01/22/2016  . Pulmonary nodule 01/15/2016  . Hyperthyroidism 05/07/2015  . Loss of weight 05/05/2015  . Appetite impaired 05/05/2015  . General weakness 05/05/2015  . UTI (urinary tract infection) 05/05/2015  . Hypokalemia 05/05/2015  . Abnormal TSH 05/05/2015  . Lower back pain 10/31/2014  . Dysuria 11/14/2013  . Diabetic peripheral neuropathy ([place]) 04/22/2013  . Hypersomnolence 06/14/2012  . Eczema 10/19/2011  . Preventative health care 04/10/2011  . GOITER, MULTINODULAR 05/21/2010  . OSTEOPENIA 04/07/2008  . COLON CANCER 04/07/2008  . Diabetes mellitus with neuropathy ([place]) 01/30/2008  . DEGENERATIVE JOINT DISEASE 01/30/2008  . ARTHRITIS 01/30/2008  . HYPERCHOLESTEROLEMIA 09/03/2007  . OBESITY 09/03/2007  . Essential hypertension 09/03/2007  . CORONARY ARTERY DISEASE 09/03/2007  . Allergic rhinitis 09/03/2007  . Asthma 09/03/2007  . GERD 09/03/2007  . Palpitations 09/03/2007  PHYSICAL THERAPY DISCHARGE SUMMARY   Plan: Patient agrees to discharge.  Patient goals were met. Patient is being discharged due to meeting the stated rehab goals.  ?????       Johncharles Fusselman,ANGIE  PTA 03/05/2018, 10:53 AM  [place], [place], [address] Phone: [phone]   Fax:  [phone]  Name: [name] [name] MRN: [id number] Date of Birth: 12-21-1945

## 2018-03-19 ENCOUNTER — Other Ambulatory Visit: Payer: Self-pay | Admitting: Internal Medicine

## 2018-04-02 ENCOUNTER — Ambulatory Visit
Admission: RE | Admit: 2018-04-02 | Discharge: 2018-04-02 | Disposition: A | Discharge status: Home / Self Care | Payer: Medicare Other | Source: Ambulatory Visit | Attending: Internal Medicine | Admitting: Internal Medicine

## 2018-04-02 DIAGNOSIS — Z1231 Encounter for screening mammogram for malignant neoplasm of breast: Secondary | ICD-10-CM | POA: Diagnosis not present

## 2018-04-18 ENCOUNTER — Encounter: Payer: Self-pay | Admitting: Internal Medicine

## 2018-04-26 ENCOUNTER — Encounter: Payer: Self-pay | Admitting: Internal Medicine

## 2018-05-14 ENCOUNTER — Other Ambulatory Visit (INDEPENDENT_AMBULATORY_CARE_PROVIDER_SITE_OTHER): Payer: Medicare Other

## 2018-05-14 ENCOUNTER — Ambulatory Visit (INDEPENDENT_AMBULATORY_CARE_PROVIDER_SITE_OTHER)
Admission: RE | Admit: 2018-05-14 | Discharge: 2018-05-14 | Disposition: A | Discharge status: Home / Self Care | Payer: Medicare Other | Source: Ambulatory Visit | Attending: Adult Health | Admitting: Adult Health

## 2018-05-14 ENCOUNTER — Encounter: Payer: Self-pay | Admitting: Adult Health

## 2018-05-14 ENCOUNTER — Ambulatory Visit (INDEPENDENT_AMBULATORY_CARE_PROVIDER_SITE_OTHER): Payer: Medicare Other | Admitting: Adult Health

## 2018-05-14 DIAGNOSIS — E1140 Type 2 diabetes mellitus with diabetic neuropathy, unspecified: Secondary | ICD-10-CM | POA: Diagnosis not present

## 2018-05-14 DIAGNOSIS — B370 Candidal stomatitis: Secondary | ICD-10-CM

## 2018-05-14 DIAGNOSIS — J4541 Moderate persistent asthma with (acute) exacerbation: Secondary | ICD-10-CM

## 2018-05-14 DIAGNOSIS — R05 Cough: Secondary | ICD-10-CM | POA: Diagnosis not present

## 2018-05-14 LAB — LIPID PANEL
CHOL/HDL RATIO: 3
Cholesterol: 155 mg/dL (ref 0–200)
HDL: 50.4 mg/dL (ref >39.00)
LDL Cholesterol: 86 mg/dL (ref 0–99)
NONHDL: 104.86
Triglycerides: 93 mg/dL (ref 0.0–149.0)
VLDL: 18.6 mg/dL (ref 0.0–40.0)

## 2018-05-14 LAB — BASIC METABOLIC PANEL
BUN: 14 mg/dL (ref 6–23)
CHLORIDE: 100 meq/L (ref 96–112)
CO2: 30 meq/L (ref 19–32)
CREATININE: 0.69 mg/dL (ref 0.40–1.20)
Calcium: 9.9 mg/dL (ref 8.4–10.5)
GFR: 107.35 mL/min (ref >60.00)
Glucose, Bld: 101 mg/dL — ABNORMAL HIGH (ref 70–99)
Potassium: 3.3 mEq/L — ABNORMAL LOW (ref 3.5–5.1)
Sodium: 140 mEq/L (ref 135–145)

## 2018-05-14 LAB — HEMOGLOBIN A1C: HEMOGLOBIN A1C: 6.4 % (ref 4.6–6.5)

## 2018-05-14 LAB — HEPATIC FUNCTION PANEL
ALK PHOS: 66 U/L (ref 39–117)
ALT: 17 U/L (ref 0–35)
AST: 24 U/L (ref 0–37)
Albumin: 4.1 g/dL (ref 3.5–5.2)
BILIRUBIN DIRECT: 0.2 mg/dL (ref 0.0–0.3)
Total Bilirubin: 0.9 mg/dL (ref 0.2–1.2)
Total Protein: 7.3 g/dL (ref 6.0–8.3)

## 2018-05-14 MED ORDER — LEVALBUTEROL HCL 0.63 MG/3ML IN NEBU
0.6300 mg | INHALATION_SOLUTION | Freq: Once | RESPIRATORY_TRACT | Status: AC
  Administered 2018-05-14: 0.63 mg via RESPIRATORY_TRACT

## 2018-05-14 MED ORDER — HYDROCODONE-HOMATROPINE 5-1.5 MG/5ML PO SYRP: 5.0000 mL | ORAL_SOLUTION | Freq: Four times a day (QID) | ORAL | 0 refills | Status: DC | PRN

## 2018-05-14 MED ORDER — PREDNISONE 10 MG PO TABS: ORAL_TABLET | ORAL | 0 refills | Status: DC

## 2018-05-14 MED ORDER — CLOTRIMAZOLE 10 MG MT TROC: 10.0000 mg | Freq: Every day | OROMUCOSAL | 0 refills | Status: DC

## 2018-05-14 MED ORDER — DOXYCYCLINE HYCLATE 100 MG PO TABS: 100.0000 mg | ORAL_TABLET | Freq: Two times a day (BID) | ORAL | 0 refills | Status: DC

## 2018-05-14 NOTE — Assessment & Plan Note (Signed)
Inhaler educaiton given , oral care  mycelex x 1 week

## 2018-05-14 NOTE — Patient Instructions (Addendum)
Doxycycline 100mg  Twice daily  For 1 week . Take w/ food  Mucinex DM Twice daily As needed  Cough /congestion  Prednisone taper over next week .  Mycelex troches fives times daily for 1 week  Rinse well after inhaler  Continue on Symbicort 2 puffs Twice daily .  Chest xray today .  May use Albuterol inhaler As needed   Please contact office for sooner follow up if symptoms do not improve or worsen or seek emergency care  Follow up with Dr. Elsworth Soho 3-4 months and As needed

## 2018-05-14 NOTE — Assessment & Plan Note (Signed)
Flare  xopenex neb x 1  Check cxr   Plan  Patient Instructions  Doxycycline 100mg  Twice daily  For 1 week . Take w/ food  Mucinex DM Twice daily As needed  Cough /congestion  Prednisone taper over next week .  Mycelex troches fives times daily for 1 week  Rinse well after inhaler  Continue on Symbicort 2 puffs Twice daily .  Chest xray today .  May use Albuterol inhaler As needed   Please contact office for sooner follow up if symptoms do not improve or worsen or seek emergency care  Follow up with Dr. Elsworth Soho 3-4 months and As needed

## 2018-05-14 NOTE — Progress Notes (Signed)
'@Patient'  ID: [name] [name], female    DOB: [birth year], 72 y.o.   MRN: [id number]  Chief Complaint  Patient presents with  . Acute Visit    asthma     Referring provider: Biagio Borg, MD  HPI: 72 year old female former smoker followed for asthmatic bronchitis complicated by GERD and suspected component of vocal cord dysfunction.  She has lung nodule on CT chest.   Past medical history significant for oral cancer diagnosed in 2016  TEST  2014 Spirometry >>FEv1 84%  CT chest 01/2016 3 mm RLL nodules    05/14/2018 Acute OV : Asthma  Patient presents for an acute office visit.  Patient complains of 5 days of increased cough, congestion, wheezing and shortness of breath.  Initially had low-grade fever. She remains on Symbicort twice daily.  She denies any chest pain, orthopnea, edema.  Patient has had increased albuterol use.    Allergies  Allergen Reactions  . Simvastatin Other (See Comments)    Muscle spasms    Immunization History  Administered Date(s) Administered  . H1N1 10/13/2008  . Influenza Split 07/31/2012  . Influenza Whole 09/12/2007, 09/12/2008, 08/20/2009, 08/26/2011  . Influenza, High Dose Seasonal PF 08/26/2014, 08/30/2016, 08/24/2017  . Influenza, Seasonal, Injecte, Preservative Fre 08/26/2013  . Influenza-Unspecified 08/27/2015  . Pneumococcal Conjugate-13 10/31/2014  . Pneumococcal Polysaccharide-23 12/21/2010  . Td 03/22/1995, 09/20/2007  . Tdap 11/22/2017    Past Medical History:  Diagnosis Date  . ARTHRITIS 01/30/2008  . ASTHMA 09/03/2007  . Cancer ([place]) 1987   palate cancer  . COLON CANCER 04/07/2008   pt states she had colon cancer 1987  . COPD 09/03/2007  . COPD (chronic obstructive pulmonary disease) ([place]) 04/22/2013  . CORONARY ARTERY DISEASE 09/03/2007  . DEGENERATIVE JOINT DISEASE 01/30/2008  . DIABETES MELLITUS 01/30/2008  . Diabetic peripheral neuropathy ([place]) 04/22/2013  . Eczema   . GERD 09/03/2007  . GLUCOSE INTOLERANCE 09/03/2007  .  GOITER, MULTINODULAR 05/21/2010  . History of blood transfusion   . HYPERCHOLESTEROLEMIA 09/03/2007  . HYPERTENSION 09/03/2007  . Impaired glucose tolerance 04/10/2011  . LVH (left ventricular hypertrophy)   . OBESITY 09/03/2007  . Osteopenia   . Psoriasis   . Shortness of breath dyspnea    With exertion and allergies to pollen    Tobacco History: Social History   Tobacco Use  Smoking Status Former Smoker  . Packs/day: 1.00  . Years: 15.00  . Pack years: 15.00  . Types: Cigarettes  . Last attempt to quit: 12/12/1978  . Years since quitting: 39.4  Smokeless Tobacco Never Used   Counseling given: Not Answered   Outpatient Encounter Medications as of 05/14/2018  Medication Sig  . acyclovir cream (ZOVIRAX) 5 % Apply 1 application topically daily as needed. For psoriasis  . amLODipine (NORVASC) 5 MG tablet Take 1 tablet (5 mg total) by mouth daily.  Marland Kitchen aspirin EC 81 MG tablet Take 1 tablet (81 mg total) by mouth daily.  . Blood Glucose Monitoring Suppl (ONE TOUCH ULTRA SYSTEM KIT) W/DEVICE KIT 1 kit by Does not apply route once. 250.02  . budesonide-formoterol (SYMBICORT) 80-4.5 MCG/ACT inhaler Inhale 2 puffs into the lungs 2 (two) times daily.  . clobetasol (TEMOVATE) 0.05 % ointment Use twice once daily to hand and feet as needed (Patient taking differently: Apply 1 application topically 2 (two) times daily. Use twice once daily to hand and feet as needed)  . fluocinolone (SYNALAR) 0.025 % ointment Apply topically 2 (two) times daily as needed. (Patient  taking differently: Apply 1 application topically 2 (two) times daily as needed. For psoriasis)  . hydrochlorothiazide (HYDRODIURIL) 25 MG tablet Take 1 tablet (25 mg total) by mouth daily.  Marland Kitchen ketoconazole (NIZORAL) 2 % cream Apply topically 3 (three) times daily. use as directed two times a day as needed under breast  . loratadine-pseudoephedrine (CLARITIN-D 24-HOUR) 10-240 MG per 24 hr tablet Take 1 tablet by mouth daily.  Marland Kitchen lovastatin  (MEVACOR) 40 MG tablet Take 2 tablets (80 mg total) by mouth daily.  . metFORMIN (GLUCOPHAGE-XR) 500 MG 24 hr tablet Take 1 tablet (500 mg total) by mouth daily.  . methimazole (TAPAZOLE) 5 MG tablet Take 1 tablet (5 mg total) by mouth daily.  . metoprolol tartrate (LOPRESSOR) 25 MG tablet TAKE 1 & 1/2 TABLETS TWICE A DAY  . montelukast (SINGULAIR) 10 MG tablet Take 1 tablet (10 mg total) by mouth at bedtime.  . naproxen (NAPROSYN) 500 MG tablet Take 1 tablet (500 mg total) by mouth 2 (two) times daily with a meal.  . omeprazole (PRILOSEC) 20 MG capsule TAKE 1 CAPSULE BY MOUTH EVERY DAY  . ONE TOUCH ULTRA TEST test strip TEST ONCE DAILY. E11.9  . tiZANidine (ZANAFLEX) 4 MG tablet Take 1 tablet (4 mg total) by mouth every 6 (six) hours as needed for muscle spasms.  . traMADol (ULTRAM) 50 MG tablet TAKE 1 TABELT BY MOUTH EVERY 6 HOURS AS NEEDED FOR PAIN  . clotrimazole (MYCELEX) 10 MG troche Take 1 tablet (10 mg total) by mouth 5 (five) times daily.  Marland Kitchen doxycycline (VIBRA-TABS) 100 MG tablet Take 1 tablet (100 mg total) by mouth 2 (two) times daily.  . predniSONE (DELTASONE) 10 MG tablet 4 tabs for 2 days, then 3 tabs for 2 days, 2 tabs for 2 days, then 1 tab for 2 days, then stop  . [DISCONTINUED] metFORMIN (GLUCOPHAGE-XR) 500 MG 24 hr tablet TAKE 2 TABLETS BY MOUTH DAILY. (Patient not taking: Reported on 05/14/2018)   No facility-administered encounter medications on file as of 05/14/2018.      Review of Systems  Constitutional:   No  weight loss, night sweats,  Fevers, chills, fatigue, or  lassitude.  HEENT:   No headaches,  Difficulty swallowing,  Tooth/dental problems, or  Sore throat,                No sneezing, itching, ear ache,  +nasal congestion, post nasal drip,   CV:  No chest pain,  Orthopnea, PND, swelling in lower extremities, anasarca, dizziness, palpitations, syncope.   GI  No heartburn, indigestion, abdominal pain, nausea, vomiting, diarrhea, change in bowel habits, loss of  appetite, bloody stools.   Resp:  .  No chest wall deformity  Skin: no rash or lesions.  GU: no dysuria, change in color of urine, no urgency or frequency.  No flank pain, no hematuria   MS:  No joint pain or swelling.  No decreased range of motion.  No back pain.    Physical Exam  BP 140/72 (BP Location: Left Arm, Cuff Size: Normal)   Pulse 80   Temp 97.6 F (36.4 C) (Oral)   Ht '5\' 1"'  (1.549 m)   Wt 125 lb 6.4 oz (56.9 kg)   SpO2 93%   BMI 23.69 kg/m   GEN: A/Ox3; pleasant , NAD, elderly    HEENT:  [place]/AT,  EACs-clear, TMs-wnl, NOSE-clear drainage , THROAT- notable white patches c/w thrush ,  no postnasal drip or exudate noted.   NECK:  Supple  w/ fair ROM; no JVD; normal carotid impulses w/o bruits; no thyromegaly or nodules palpated; no lymphadenopathy.    RESP  Few scattered rhonchi  no accessory muscle use, no dullness to percussion  CARD:  RRR, no m/r/g, no peripheral edema, pulses intact, no cyanosis or clubbing.  GI:   Soft & nt; nml bowel sounds; no organomegaly or masses detected.   Musco: Warm bil, no deformities or joint swelling noted.   Neuro: alert, no focal deficits noted.    Skin: Warm, no lesions or rashes    Lab Results:  CBC   BNP No results found for: BNP  ProBNP No results found for: PROBNP  Imaging: No results found.   Assessment & Plan:   Asthmatic bronchitis Flare  xopenex neb x 1  Check cxr   Plan  Patient Instructions  Doxycycline 175m Twice daily  For 1 week . Take w/ food  Mucinex DM Twice daily As needed  Cough /congestion  Prednisone taper over next week .  Mycelex troches fives times daily for 1 week  Rinse well after inhaler  Continue on Symbicort 2 puffs Twice daily .  Chest xray today .  May use Albuterol inhaler As needed   Please contact office for sooner follow up if symptoms do not improve or worsen or seek emergency care  Follow up with Dr. AElsworth Soho3-4 months and As needed          Oral  candidiasis Inhaler educaiton given , oral care  mycelex x 1 week      TRexene Edison NP 05/14/2018

## 2018-05-14 NOTE — Addendum Note (Signed)
Addended by: Parke Poisson E on: 05/14/2018 03:24 PM   Modules accepted: Orders

## 2018-05-22 ENCOUNTER — Ambulatory Visit (INDEPENDENT_AMBULATORY_CARE_PROVIDER_SITE_OTHER): Payer: Medicare Other | Admitting: Internal Medicine

## 2018-05-22 ENCOUNTER — Encounter: Payer: Self-pay | Admitting: Internal Medicine

## 2018-05-22 VITALS — BP 114/70 | HR 63 | Temp 97.8°F | Ht 61.0 in | Wt 131.0 lb

## 2018-05-22 DIAGNOSIS — F329 Major depressive disorder, single episode, unspecified: Secondary | ICD-10-CM | POA: Insufficient documentation

## 2018-05-22 DIAGNOSIS — J45909 Unspecified asthma, uncomplicated: Secondary | ICD-10-CM

## 2018-05-22 DIAGNOSIS — I10 Essential (primary) hypertension: Secondary | ICD-10-CM

## 2018-05-22 DIAGNOSIS — F32A Depression, unspecified: Secondary | ICD-10-CM | POA: Insufficient documentation

## 2018-05-22 DIAGNOSIS — J45901 Unspecified asthma with (acute) exacerbation: Secondary | ICD-10-CM | POA: Diagnosis not present

## 2018-05-22 DIAGNOSIS — E7800 Pure hypercholesterolemia, unspecified: Secondary | ICD-10-CM

## 2018-05-22 DIAGNOSIS — E1140 Type 2 diabetes mellitus with diabetic neuropathy, unspecified: Secondary | ICD-10-CM

## 2018-05-22 MED ORDER — CITALOPRAM HYDROBROMIDE 10 MG PO TABS: 10.0000 mg | ORAL_TABLET | Freq: Every day | ORAL | 3 refills | Status: DC

## 2018-05-22 MED ORDER — PREDNISONE 10 MG PO TABS: ORAL_TABLET | ORAL | 0 refills | Status: DC

## 2018-05-22 MED ORDER — HYDROCHLOROTHIAZIDE 12.5 MG PO CAPS: 12.5000 mg | ORAL_CAPSULE | Freq: Every day | ORAL | 3 refills | Status: DC

## 2018-05-22 MED ORDER — METHYLPREDNISOLONE ACETATE 80 MG/ML IJ SUSP
80.0000 mg | Freq: Once | INTRAMUSCULAR | Status: AC
  Administered 2018-05-22: 80 mg via INTRAMUSCULAR

## 2018-05-22 NOTE — Assessment & Plan Note (Addendum)
stable overall by history and exam, recent data reviewed with pt, and pt to continue medical treatment as before except will reduce the hct to 12.5 and hold further K for now,  to f/u any worsening symptoms or concerns

## 2018-05-22 NOTE — Assessment & Plan Note (Signed)
stable overall by history and exam, recent data reviewed with pt, and pt to continue medical treatment as before,  to f/u any worsening symptoms or concerns Lab Results  Component Value Date   LDLCALC 86 05/14/2018

## 2018-05-22 NOTE — Progress Notes (Signed)
 Subjective:    Patient ID: [name] [name], female    DOB: 10/19/1946, 72 y.o.   MRN: [id number]  HPI  Here to f/u; overall doing ok,  Pt denies chest pain, orthopnea, PND, increased LE swelling, palpitations, dizziness or syncope, except has persistent cough, wheezing, sob despite recent antibx/prednisone per pulmonary last wk.  Is some improved but not resolved.  Pt denies new neurological symptoms such as new headache, or facial or extremity weakness or numbness.  Pt denies polydipsia, polyuria, or low sugar episode.  Pt states overall good compliance with meds, mostly trying to follow appropriate diet, with wt overall stable  No other new complaints or interval change except refuses to take "those big potassium pills" Wt Readings from Last 3 Encounters:  05/22/18 131 lb (59.4 kg)  05/14/18 125 lb 6.4 oz (56.9 kg)  02/26/18 133 lb 9.6 oz (60.6 kg)   BP Readings from Last 3 Encounters:  05/22/18 114/70  05/14/18 140/72  02/26/18 128/72   Past Medical History:  Diagnosis Date  . ARTHRITIS 01/30/2008  . ASTHMA 09/03/2007  . Cancer ([facility]) 1987   palate cancer  . COLON CANCER 04/07/2008   pt states she had colon cancer 1987  . COPD 09/03/2007  . COPD (chronic obstructive pulmonary disease) ([facility]) 04/22/2013  . CORONARY ARTERY DISEASE 09/03/2007  . DEGENERATIVE JOINT DISEASE 01/30/2008  . DIABETES MELLITUS 01/30/2008  . Diabetic peripheral neuropathy ([facility]) 04/22/2013  . Eczema   . GERD 09/03/2007  . GLUCOSE INTOLERANCE 09/03/2007  . GOITER, MULTINODULAR 05/21/2010  . History of blood transfusion   . HYPERCHOLESTEROLEMIA 09/03/2007  . HYPERTENSION 09/03/2007  . Impaired glucose tolerance 04/10/2011  . LVH (left ventricular hypertrophy)   . OBESITY 09/03/2007  . Osteopenia   . Psoriasis   . Shortness of breath dyspnea    With exertion and allergies to pollen   Past Surgical History:  Procedure Laterality Date  . ABDOMINAL HYSTERECTOMY    . CARDIAC CATHETERIZATION    . COLON SURGERY    .  FLOOR OF MOUTH BIOPSY N/A 12/24/2015   Procedure: ORAL BIOPSY;  Surgeon: Jefry Rosen, MD;  Location: [facility] OR;  Service: ENT;  Laterality: N/A;  . HEMICOLECTOMY  12/1985   cancer  . HERNIA REPAIR    . MAXILLECTOMY Right 01/22/2016   Procedure: RIGHT SIDE PARTIAL MAXILLECTOMY;  Surgeon: Jefry Rosen, MD;  Location: [facility] OR;  Service: ENT;  Laterality: Right;  . TUBAL LIGATION      reports that she quit smoking about 39 years ago. Her smoking use included cigarettes. She has a 15.00 pack-year smoking history. She has never used smokeless tobacco. She reports that she does not drink alcohol or use drugs. family history includes Cancer in her other and sister; Colon cancer in her other and paternal aunt; Heart disease in her mother; Liver disease in her father; Thyroid disease in her brother and sister. Allergies  Allergen Reactions  . Simvastatin Other (See Comments)    Muscle spasms   Current Outpatient Medications on File Prior to Visit  Medication Sig Dispense Refill  . acyclovir cream (ZOVIRAX) 5 % Apply 1 application topically daily as needed. For psoriasis    . amLODipine (NORVASC) 5 MG tablet Take 1 tablet (5 mg total) by mouth daily. 90 tablet 3  . aspirin EC 81 MG tablet Take 1 tablet (81 mg total) by mouth daily. 90 tablet 11  . Blood Glucose Monitoring Suppl (ONE TOUCH ULTRA SYSTEM KIT) W/DEVICE KIT 1 kit by Does   not apply route once. 250.02 1 each 0  . budesonide-formoterol (SYMBICORT) 80-4.5 MCG/ACT inhaler Inhale 2 puffs into the lungs 2 (two) times daily. 1 Inhaler 4  . clobetasol (TEMOVATE) 0.05 % ointment Use twice once daily to hand and feet as needed (Patient taking differently: Apply 1 application topically 2 (two) times daily. Use twice once daily to hand and feet as needed) 30 g 1  . clotrimazole (MYCELEX) 10 MG troche Take 1 tablet (10 mg total) by mouth 5 (five) times daily. 35 tablet 0  . fluocinolone (SYNALAR) 0.025 % ointment Apply topically 2 (two) times daily as needed.  (Patient taking differently: Apply 1 application topically 2 (two) times daily as needed. For psoriasis) 30 g 1  . HYDROcodone-homatropine (HYDROMET) 5-1.5 MG/5ML syrup Take 5 mLs by mouth every 6 (six) hours as needed for cough. 120 mL 0  . ketoconazole (NIZORAL) 2 % cream Apply topically 3 (three) times daily. use as directed two times a day as needed under breast 15 g 3  . loratadine-pseudoephedrine (CLARITIN-D 24-HOUR) 10-240 MG per 24 hr tablet Take 1 tablet by mouth daily.    Marland Kitchen lovastatin (MEVACOR) 40 MG tablet Take 2 tablets (80 mg total) by mouth daily. 180 tablet 3  . metFORMIN (GLUCOPHAGE-XR) 500 MG 24 hr tablet Take 1 tablet (500 mg total) by mouth daily. 90 tablet 3  . methimazole (TAPAZOLE) 5 MG tablet Take 1 tablet (5 mg total) by mouth daily. 90 tablet 1  . metoprolol tartrate (LOPRESSOR) 25 MG tablet TAKE 1 & 1/2 TABLETS TWICE A DAY 270 tablet 3  . naproxen (NAPROSYN) 500 MG tablet Take 1 tablet (500 mg total) by mouth 2 (two) times daily with a meal. 60 tablet 2  . omeprazole (PRILOSEC) 20 MG capsule TAKE 1 CAPSULE BY MOUTH EVERY DAY 90 capsule 3  . ONE TOUCH ULTRA TEST test strip TEST ONCE DAILY. E11.9 100 each 2  . tiZANidine (ZANAFLEX) 4 MG tablet Take 1 tablet (4 mg total) by mouth every 6 (six) hours as needed for muscle spasms. 60 tablet 1  . traMADol (ULTRAM) 50 MG tablet TAKE 1 TABELT BY MOUTH EVERY 6 HOURS AS NEEDED FOR PAIN 120 tablet 1   No current facility-administered medications on file prior to visit.    Review of Systems  Constitutional: Negative for other unusual diaphoresis or sweats HENT: Negative for ear discharge or swelling Eyes: Negative for other worsening visual disturbances Respiratory: Negative for stridor or other swelling  Gastrointestinal: Negative for worsening distension or other blood Genitourinary: Negative for retention or other urinary change Musculoskeletal: Negative for other MSK pain or swelling Skin: Negative for color change or other  new lesions Neurological: Negative for worsening tremors and other numbness  Psychiatric/Behavioral: Negative for worsening agitation or other fatigue All other system neg per pt    Objective:   Physical Exam BP 114/70   Pulse 63   Temp 97.8 F (36.6 C) (Oral)   Ht 5' 1" (1.549 m)   Wt 131 lb (59.4 kg)   SpO2 95%   BMI 24.75 kg/m  VS noted, not ill appearing Constitutional: Pt appears in NAD HENT: Head: NCAT.  Right Ear: External ear normal.  Left Ear: External ear normal.  Eyes: . Pupils are equal, round, and reactive to light. Conjunctivae and EOM are normal Nose: without d/c or deformity Neck: Neck supple. Gross normal ROM Cardiovascular: Normal rate and regular rhythm.   Pulmonary/Chest: Effort normal and breath sounds decreased without rales but with  mild bilat rhonchi and wheezing.  Neurological: Pt is alert. At baseline orientation, motor grossly intact Skin: Skin is warm. No rashes, other new lesions, no LE edema Psychiatric: Pt behavior is normal without agitation  No other exam findings  Lab Results  Component Value Date   WBC 6.6 11/13/2017   HGB 11.9 (L) 11/13/2017   HCT 37.4 11/13/2017   PLT 223.0 11/13/2017   GLUCOSE 101 (H) 05/14/2018   CHOL 155 05/14/2018   TRIG 93.0 05/14/2018   HDL 50.40 05/14/2018   LDLCALC 86 05/14/2018   ALT 17 05/14/2018   AST 24 05/14/2018   NA 140 05/14/2018   K 3.3 (L) 05/14/2018   CL 100 05/14/2018   CREATININE 0.69 05/14/2018   BUN 14 05/14/2018   CO2 30 05/14/2018   TSH 1.34 02/21/2018   INR 1.04 02/08/2016   HGBA1C 6.4 05/14/2018   MICROALBUR <0.7 11/13/2017       Assessment & Plan:

## 2018-05-22 NOTE — Patient Instructions (Addendum)
Ok to stay off the potassium  OK to decrease the HCT to 12.5 mg per day  You had the steroid shot today  Please take all new medication as prescribed - the prednisone 20 mg per day for 5 days  Please continue all other medications as before, and refills have been done if requested.  Please have the pharmacy call with any other refills you may need.  Please continue your efforts at being more active, low cholesterol diet, and weight control.  Please keep your appointments with your specialists as you may have planned  Please return in 6 months, or sooner if needed, with Lab testing done 3-5 days before

## 2018-05-22 NOTE — Assessment & Plan Note (Signed)
With some persistent wheezing, cough, sob - for depomedrol IM 80, pred 20 qd x 5 days,  to f/u any worsening symptoms or concerns

## 2018-05-22 NOTE — Assessment & Plan Note (Signed)
stable overall by history and exam, recent data reviewed with pt, and pt to continue medical treatment as before,  to f/u any worsening symptoms or concerns Lab Results  Component Value Date   HGBA1C 6.4 05/14/2018   

## 2018-05-28 NOTE — Progress Notes (Signed)
Reviewed & agree with plan  

## 2018-06-13 ENCOUNTER — Other Ambulatory Visit: Payer: Self-pay | Admitting: Internal Medicine

## 2018-06-20 ENCOUNTER — Other Ambulatory Visit: Payer: Self-pay

## 2018-06-20 ENCOUNTER — Ambulatory Visit (AMBULATORY_SURGERY_CENTER): Payer: Self-pay | Admitting: *Deleted

## 2018-06-20 VITALS — Ht 61.0 in | Wt 131.2 lb

## 2018-06-20 DIAGNOSIS — Z85038 Personal history of other malignant neoplasm of large intestine: Secondary | ICD-10-CM

## 2018-06-20 MED ORDER — NA SULFATE-K SULFATE-MG SULF 17.5-3.13-1.6 GM/177ML PO SOLN: 1.0000 | Freq: Once | ORAL | 0 refills | Status: AC

## 2018-06-20 NOTE — Progress Notes (Signed)
Denies allergies to eggs or soy products. Denies complications with sedation or anesthesia. Denies O2 use. Denies use of diet or weight loss medications.  Emmi instructions not given for colonoscopy, pt does not have access to a computer

## 2018-06-22 ENCOUNTER — Ambulatory Visit (INDEPENDENT_AMBULATORY_CARE_PROVIDER_SITE_OTHER): Payer: Medicare Other | Admitting: Internal Medicine

## 2018-06-22 ENCOUNTER — Ambulatory Visit (INDEPENDENT_AMBULATORY_CARE_PROVIDER_SITE_OTHER)
Admission: RE | Admit: 2018-06-22 | Discharge: 2018-06-22 | Disposition: A | Discharge status: Home / Self Care | Payer: Medicare Other | Source: Ambulatory Visit | Attending: Internal Medicine | Admitting: Internal Medicine

## 2018-06-22 ENCOUNTER — Encounter: Payer: Self-pay | Admitting: Internal Medicine

## 2018-06-22 DIAGNOSIS — I10 Essential (primary) hypertension: Secondary | ICD-10-CM

## 2018-06-22 DIAGNOSIS — M5136 Other intervertebral disc degeneration, lumbar region: Secondary | ICD-10-CM | POA: Diagnosis not present

## 2018-06-22 DIAGNOSIS — M545 Low back pain, unspecified: Secondary | ICD-10-CM

## 2018-06-22 DIAGNOSIS — E1140 Type 2 diabetes mellitus with diabetic neuropathy, unspecified: Secondary | ICD-10-CM | POA: Diagnosis not present

## 2018-06-22 MED ORDER — HYDROCHLOROTHIAZIDE 25 MG PO TABS: 25.0000 mg | ORAL_TABLET | Freq: Every day | ORAL | 3 refills | Status: DC

## 2018-06-22 NOTE — Patient Instructions (Addendum)
OK to continue the HCt at 25 mg per day  Please continue all other medications as before, including the advil and muscle relaxer as you have  Please have the pharmacy call with any other refills you may need.  Please continue your efforts at being more active, low cholesterol diet, and weight control.  Please keep your appointments with your specialists as you may have planned  Please go to the XRAY Department in the Basement (go straight as you get off the elevator) for the x-ray testing  You will be contacted by phone if any changes need to be made immediately.  Otherwise, you will receive a letter about your results with an explanation, but please check with MyChart first.  Please remember to sign up for MyChart if you have not done so, as this will be important to you in the future with finding out test results, communicating by private email, and scheduling acute appointments online when needed.

## 2018-06-22 NOTE — Assessment & Plan Note (Signed)
stable overall by history and exam, recent data reviewed with pt, and pt to continue medical treatment as before,  to f/u any worsening symptoms or concerns, cont hct 25 qd as she is doing

## 2018-06-22 NOTE — Assessment & Plan Note (Signed)
stable overall by history and exam, recent data reviewed with pt, and pt to continue medical treatment as before,  to f/u any worsening symptoms or concerns Lab Results  Component Value Date   HGBA1C 6.4 05/14/2018
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
## 2018-06-22 NOTE — Assessment & Plan Note (Signed)
Acute on chronic recurrent, likely related to underlying DJD /DDD but has had no prior imaging; for cont'd advil prn, muscle relaxer prn, check plain films;  Of note pt has no evidence for radiculopathy and we can hold on MRI at this time

## 2018-06-25 ENCOUNTER — Other Ambulatory Visit: Payer: Self-pay | Admitting: Internal Medicine

## 2018-06-25 NOTE — Telephone Encounter (Signed)
Done erx 

## 2018-07-03 ENCOUNTER — Ambulatory Visit (AMBULATORY_SURGERY_CENTER): Payer: Medicare Other | Admitting: Internal Medicine

## 2018-07-03 ENCOUNTER — Encounter: Payer: Self-pay | Admitting: Internal Medicine

## 2018-07-03 VITALS — BP 99/61 | HR 64 | Temp 97.3°F | Resp 13 | Ht 61.0 in | Wt 130.0 lb

## 2018-07-03 DIAGNOSIS — Z86010 Personal history of colonic polyps: Secondary | ICD-10-CM | POA: Diagnosis not present

## 2018-07-03 DIAGNOSIS — Z85038 Personal history of other malignant neoplasm of large intestine: Secondary | ICD-10-CM | POA: Diagnosis not present

## 2018-07-03 DIAGNOSIS — D123 Benign neoplasm of transverse colon: Secondary | ICD-10-CM | POA: Diagnosis not present

## 2018-07-03 MED ORDER — SODIUM CHLORIDE 0.9 % IV SOLN: 500.0000 mL | Freq: Once | INTRAVENOUS | Status: DC

## 2018-07-03 NOTE — Patient Instructions (Signed)
**  Handouts given on polyps and hemorrhoids**   YOU HAD AN ENDOSCOPIC PROCEDURE TODAY: Refer to the procedure report and other information in the discharge instructions given to you for any specific questions about what was found during the examination. If this information does not answer your questions, please call [place] office at [phone] to clarify.   YOU SHOULD EXPECT: Some feelings of bloating in the abdomen. Passage of more gas than usual. Walking can help get rid of the air that was put into your GI tract during the procedure and reduce the bloating. If you had a lower endoscopy (such as a colonoscopy or flexible sigmoidoscopy) you may notice spotting of blood in your stool or on the toilet paper. Some abdominal soreness may be present for a day or two, also.  DIET: Your first meal following the procedure should be a light meal and then it is ok to progress to your normal diet. A half-sandwich or bowl of soup is an example of a good first meal. Heavy or fried foods are harder to digest and may make you feel nauseous or bloated. Drink plenty of fluids but you should avoid alcoholic beverages for 24 hours. If you had a esophageal dilation, please see attached instructions for diet.    ACTIVITY: Your care partner should take you home directly after the procedure. You should plan to take it easy, moving slowly for the rest of the day. You can resume normal activity the day after the procedure however YOU SHOULD NOT DRIVE, use power tools, machinery or perform tasks that involve climbing or major physical exertion for 24 hours (because of the sedation medicines used during the test).   SYMPTOMS TO REPORT IMMEDIATELY: A gastroenterologist can be reached at any hour. Please call [phone]  for any of the following symptoms:  Following lower endoscopy (colonoscopy, flexible sigmoidoscopy) Excessive amounts of blood in the stool  Significant tenderness, worsening of abdominal pains  Swelling of  the abdomen that is new, acute  Fever of 100 or higher    FOLLOW UP:  If any biopsies were taken you will be contacted by phone or by letter within the next 1-3 weeks. Call [phone]  if you have not heard about the biopsies in 3 weeks.  Please also call with any specific questions about appointments or follow up tests.  

## 2018-07-03 NOTE — Progress Notes (Signed)
Report given to PACU, vss 

## 2018-07-03 NOTE — Op Note (Signed)
[facility] Patient Name: [name] [name] Procedure Date: 07/03/2018 8:53 AM MRN: [id number] Endoscopist: Docia Chuck. Henrene Pastor , MD Age: 72 Referring MD:  Date of Birth: 01/23/1946 Gender: Female Account #: [id number] Procedure:                Colonoscopy, With cold snare polypectomy x 1 Indications:              High risk colon cancer surveillance: Personal                            history of colon cancer. Status post right                            hemicolectomy 1987. Multiple subsequent                            surveillance examinations. Last 2 examinations 2009                            and 2014 both negative for neoplasia Medicines:                Monitored Anesthesia Care Procedure:                Pre-Anesthesia Assessment:                           - Prior to the procedure, a History and Physical                            was performed, and patient medications and                            allergies were reviewed. The patient's tolerance of                            previous anesthesia was also reviewed. The risks                            and benefits of the procedure and the sedation                            options and risks were discussed with the patient.                            All questions were answered, and informed consent                            was obtained. Prior Anticoagulants: The patient has                            taken no previous anticoagulant or antiplatelet                            agents. ASA Grade Assessment: II - A patient with  mild systemic disease. After reviewing the risks                            and benefits, the patient was deemed in                            satisfactory condition to undergo the procedure.                           After obtaining informed consent, the colonoscope                            was passed under direct vision. Throughout the                            procedure, the  patient's blood pressure, pulse, and                            oxygen saturations were monitored continuously. The                            Colonoscope was introduced through the anus and                            advanced to the the ileocolonic anastomosis. The                            terminal ileum and the rectum were photographed.                            The quality of the bowel preparation was excellent.                            The colonoscopy was performed without difficulty.                            The patient tolerated the procedure well. The bowel                            preparation used was SUPREP. Scope In: 9:04:56 AM Scope Out: 9:16:14 AM Scope Withdrawal Time: 0 hours 9 minutes 3 seconds  Total Procedure Duration: 0 hours 11 minutes 18 seconds  Findings:                 A 2 mm polyp was found in the transverse colon. The                            polyp was removed with a cold snare. Resection and                            retrieval were complete.                           Internal hemorrhoids were found during retroflexion.  The exam was otherwise without abnormality on                            direct and retroflexion views. Complications:            No immediate complications. Estimated blood loss:                            None. Estimated Blood Loss:     Estimated blood loss: none. Impression:               - One 2 mm polyp in the transverse colon, removed                            with a cold snare. Resected and retrieved.                           - Internal hemorrhoids.                           - The examination was otherwise normal on direct                            and retroflexion views. Recommendation:           - Repeat colonoscopy in 5 years for surveillance.                           - Patient has a contact number available for                            emergencies. The signs and symptoms of potential                             delayed complications were discussed with the                            patient. Return to normal activities tomorrow.                            Written discharge instructions were provided to the                            patient.                           - Resume previous diet.                           - Continue present medications.                           - Await pathology results. Docia Chuck. Henrene Pastor, MD 07/03/2018 9:20:44 AM This report has been signed electronically.

## 2018-07-03 NOTE — Progress Notes (Signed)
Pt's states no medical or surgical changes since previsit or office visit. 

## 2018-07-03 NOTE — Progress Notes (Signed)
Called to room to assist during endoscopic procedure.  Patient ID and intended procedure confirmed with present staff. Received instructions for my participation in the procedure from the performing physician.  

## 2018-07-04 ENCOUNTER — Telehealth: Payer: Self-pay | Admitting: *Deleted

## 2018-07-04 NOTE — Telephone Encounter (Signed)
  Follow up Call-  Call back number 07/03/2018  Post procedure Call Back phone  # [phone]  Permission to leave phone message Yes  Some recent data might be hidden     Patient questions:  Do you have a fever, pain , or abdominal swelling? No. Pain Score  0 *  Have you tolerated food without any problems? Yes.    Have you been able to return to your normal activities? Yes.    Do you have any questions about your discharge instructions: Diet   No. Medications  No. Follow up visit  No.  Do you have questions or concerns about your Care? No.  Actions: * If pain score is 4 or above: No action needed, pain <4.

## 2018-07-09 ENCOUNTER — Encounter: Payer: Self-pay | Admitting: Internal Medicine

## 2018-07-10 DIAGNOSIS — Z85819 Personal history of malignant neoplasm of unspecified site of lip, oral cavity, and pharynx: Secondary | ICD-10-CM | POA: Diagnosis not present

## 2018-07-13 ENCOUNTER — Other Ambulatory Visit: Payer: Self-pay | Admitting: Internal Medicine

## 2018-07-20 DIAGNOSIS — H2513 Age-related nuclear cataract, bilateral: Secondary | ICD-10-CM | POA: Diagnosis not present

## 2018-08-01 ENCOUNTER — Other Ambulatory Visit: Payer: Self-pay | Admitting: Internal Medicine

## 2018-08-24 ENCOUNTER — Ambulatory Visit (INDEPENDENT_AMBULATORY_CARE_PROVIDER_SITE_OTHER): Payer: Medicare Other | Admitting: Endocrinology

## 2018-08-24 ENCOUNTER — Encounter: Payer: Self-pay | Admitting: Endocrinology

## 2018-08-24 VITALS — BP 120/68 | HR 62 | Ht 61.0 in | Wt 132.2 lb

## 2018-08-24 DIAGNOSIS — E0590 Thyrotoxicosis, unspecified without thyrotoxic crisis or storm: Secondary | ICD-10-CM | POA: Diagnosis not present

## 2018-08-24 DIAGNOSIS — Z23 Encounter for immunization: Secondary | ICD-10-CM

## 2018-08-24 LAB — TSH: TSH: 2.01 u[IU]/mL (ref 0.35–4.50)

## 2018-08-24 LAB — T4, FREE: FREE T4: 0.95 ng/dL (ref 0.60–1.60)

## 2018-08-24 NOTE — Progress Notes (Signed)
Subjective:    Patient ID: [name] [name], female    DOB: 06/14/1946, 72 y.o.   MRN: [id number]  HPI Pt returns for f/u of a small multinodular goiter and hyperthyroidism (dx'ed 2011; nodules have not met criteria for bx; f/u ultrasound in 2014 was unchanged; in 2017, CT showed normal thyroid; in 2016, TSH was more suppressed; she was offered RAI, but chose tapazole). since on the tapazole, pt states she feels well in general.  She takes tapazole 5 mg  QD, as rx'ed.   Past Medical History:  Diagnosis Date  . ARTHRITIS 01/30/2008  . ASTHMA 09/03/2007  . Cancer ([place]) 1987   palate cancer  . COLON CANCER 04/07/2008   pt states she had colon cancer 1987  . COPD 09/03/2007  . COPD (chronic obstructive pulmonary disease) ([place]) 04/22/2013  . CORONARY ARTERY DISEASE 09/03/2007  . DEGENERATIVE JOINT DISEASE 01/30/2008  . DIABETES MELLITUS 01/30/2008  . Diabetic peripheral neuropathy ([place]) 04/22/2013  . Eczema   . GERD 09/03/2007  . GLUCOSE INTOLERANCE 09/03/2007  . GOITER, MULTINODULAR 05/21/2010  . History of blood transfusion   . HYPERCHOLESTEROLEMIA 09/03/2007  . HYPERTENSION 09/03/2007  . Impaired glucose tolerance 04/10/2011  . LVH (left ventricular hypertrophy)   . OBESITY 09/03/2007  . Osteopenia   . Psoriasis   . Shortness of breath dyspnea    With exertion and allergies to pollen    Past Surgical History:  Procedure Laterality Date  . ABDOMINAL HYSTERECTOMY    . CARDIAC CATHETERIZATION    . COLON SURGERY    . FLOOR OF MOUTH BIOPSY N/A 12/24/2015   Procedure: ORAL BIOPSY;  Surgeon: Izora Gala, MD;  Location: [facility];  Service: ENT;  Laterality: N/A;  . HEMICOLECTOMY  12/1985   cancer  . HERNIA REPAIR    . MAXILLECTOMY Right 01/22/2016   Procedure: RIGHT SIDE PARTIAL MAXILLECTOMY;  Surgeon: Izora Gala, MD;  Location: [facility];  Service: ENT;  Laterality: Right;  . TUBAL LIGATION      Social History   Socioeconomic History  . Marital status: Married    Spouse name: Not on file  .  Number of children: 4  . Years of education: Not on file  . Highest education level: Not on file  Occupational History  . Occupation: stay at home    Employer: UNEMPLOYED  Social Needs  . Financial resource strain: Not on file  . Food insecurity:    Worry: Not on file    Inability: Not on file  . Transportation needs:    Medical: Not on file    Non-medical: Not on file  Tobacco Use  . Smoking status: Former Smoker    Packs/day: 1.00    Years: 15.00    Pack years: 15.00    Types: Cigarettes    Last attempt to quit: 12/12/1978    Years since quitting: 39.7  . Smokeless tobacco: Never Used  Substance and Sexual Activity  . Alcohol use: No  . Drug use: No  . Sexual activity: Not on file  Lifestyle  . Physical activity:    Days per week: Not on file    Minutes per session: Not on file  . Stress: Not on file  Relationships  . Social connections:    Talks on phone: Not on file    Gets together: Not on file    Attends religious service: Not on file    Active member of club or organization: Not on file    Attends meetings  of clubs or organizations: Not on file    Relationship status: Not on file  . Intimate partner violence:    Fear of current or ex partner: Not on file    Emotionally abused: Not on file    Physically abused: Not on file    Forced sexual activity: Not on file  Other Topics Concern  . Not on file  Social History Narrative   1 child died with meningitis    Current Outpatient Medications on File Prior to Visit  Medication Sig Dispense Refill  . acyclovir cream (ZOVIRAX) 5 % Apply 1 application topically daily as needed. For psoriasis    . albuterol (PROVENTIL HFA;VENTOLIN HFA) 108 (90 Base) MCG/ACT inhaler Inhale into the lungs.    Marland Kitchen amLODipine (NORVASC) 5 MG tablet Take 1 tablet (5 mg total) by mouth daily. 90 tablet 3  . aspirin EC 81 MG tablet Take 1 tablet (81 mg total) by mouth daily. 90 tablet 11  . Blood Glucose Monitoring Suppl (ONE TOUCH ULTRA  SYSTEM KIT) W/DEVICE KIT 1 kit by Does not apply route once. 250.02 1 each 0  . budesonide-formoterol (SYMBICORT) 80-4.5 MCG/ACT inhaler Inhale 2 puffs into the lungs 2 (two) times daily. 1 Inhaler 4  . clobetasol (TEMOVATE) 0.05 % ointment Use twice once daily to hand and feet as needed (Patient taking differently: Apply 1 application topically 2 (two) times daily. Use twice once daily to hand and feet as needed) 30 g 1  . hydrochlorothiazide (HYDRODIURIL) 25 MG tablet Take 1 tablet (25 mg total) by mouth daily. 90 tablet 3  . loratadine-pseudoephedrine (CLARITIN-D 24-HOUR) 10-240 MG per 24 hr tablet Take 1 tablet by mouth daily.    Marland Kitchen lovastatin (MEVACOR) 40 MG tablet Take 2 tablets (80 mg total) by mouth daily. 180 tablet 3  . metFORMIN (GLUCOPHAGE-XR) 500 MG 24 hr tablet Take 1 tablet (500 mg total) by mouth daily. 90 tablet 3  . methimazole (TAPAZOLE) 5 MG tablet Take 1 tablet (5 mg total) by mouth daily. 90 tablet 1  . metoprolol tartrate (LOPRESSOR) 25 MG tablet TAKE 1 & 1/2 TABLETS TWICE A DAY 270 tablet 3  . naproxen (NAPROSYN) 500 MG tablet TAKE 1 TABLET (500 MG TOTAL) BY MOUTH 2 (TWO) TIMES DAILY WITH A MEAL. 60 tablet 2  . omeprazole (PRILOSEC) 20 MG capsule TAKE 1 CAPSULE BY MOUTH EVERY DAY 90 capsule 3  . ONE TOUCH ULTRA TEST test strip TEST ONCE DAILY. E11.9 100 each 2  . tiZANidine (ZANAFLEX) 4 MG tablet Take 1 tablet (4 mg total) by mouth every 6 (six) hours as needed for muscle spasms. 60 tablet 1  . traMADol (ULTRAM) 50 MG tablet TAKE 1 TABELT BY MOUTH EVERY 6 HOURS AS NEEDED FOR PAIN 120 tablet 1  . ONETOUCH DELICA LANCETS 81E MISC USE TO CHECK BLOOD SUGAR ONCE DAILY (Patient not taking: Reported on 08/24/2018) 100 each 1   No current facility-administered medications on file prior to visit.     Allergies  Allergen Reactions  . Simvastatin Other (See Comments)    Muscle spasms    Family History  Problem Relation Age of Onset  . Liver disease Father   . Heart disease  Mother   . Colon cancer Paternal Aunt   . Thyroid disease Sister        overactive  . Cancer Sister        lung, adrenal gland  . Thyroid disease Brother        overactive  .  Colon cancer Other        aunt  . Cancer Other        Colon Cancer-Aunt  . Breast cancer Neg Hx   . Esophageal cancer Neg Hx   . Rectal cancer Neg Hx   . Stomach cancer Neg Hx     BP 120/68 (BP Location: Left Arm)   Pulse 62   Ht 5' 1" (1.549 m)   Wt 132 lb 3.2 oz (60 kg)   SpO2 97%   BMI 24.98 kg/m    Review of Systems Denies fever    Objective:   Physical Exam VITAL SIGNS:  See vs page GENERAL: no distress NECK: 1-2 cm right thyroid nodule is again palpable.       Assessment & Plan:  Hyperthyroidism: recheck today Multinodular goiter: clinically stable.  We'll follow.   Patient Instructions  Thyroid blood tests are requested for you today.  We'll let you know about the results.   Please come back for a follow-up appointment in 6 months.   if ever you have fever while taking methimazole, stop it and call us, because of the risk of a rare side-effect.

## 2018-08-24 NOTE — Patient Instructions (Signed)
Thyroid blood tests are requested for you today.  We'll let you know about the results.   Please come back for a follow-up appointment in 6 months.   if ever you have fever while taking methimazole, stop it and call us, because of the risk of a rare side-effect.

## 2018-08-31 ENCOUNTER — Encounter: Payer: Self-pay | Admitting: Primary Care

## 2018-08-31 ENCOUNTER — Ambulatory Visit (INDEPENDENT_AMBULATORY_CARE_PROVIDER_SITE_OTHER): Payer: Medicare Other | Admitting: Primary Care

## 2018-08-31 VITALS — BP 98/62 | HR 73 | Ht 62.0 in | Wt 132.0 lb

## 2018-08-31 DIAGNOSIS — J4541 Moderate persistent asthma with (acute) exacerbation: Secondary | ICD-10-CM | POA: Diagnosis not present

## 2018-08-31 MED ORDER — AZITHROMYCIN 250 MG PO TABS: ORAL_TABLET | ORAL | 0 refills | Status: DC

## 2018-08-31 MED ORDER — METHYLPREDNISOLONE ACETATE 80 MG/ML IJ SUSP
80.0000 mg | Freq: Once | INTRAMUSCULAR | Status: AC
  Administered 2018-08-31: 80 mg via INTRAMUSCULAR

## 2018-08-31 MED ORDER — HYDROCODONE-HOMATROPINE 5-1.5 MG/5ML PO SYRP: 5.0000 mL | ORAL_SOLUTION | Freq: Every evening | ORAL | 0 refills | Status: DC | PRN

## 2018-08-31 MED ORDER — PREDNISONE 10 MG PO TABS: ORAL_TABLET | ORAL | 0 refills | Status: DC

## 2018-08-31 NOTE — Patient Instructions (Signed)
Received deo-medrol injection today  Started prednisone taper as directed and Zpack  Continues mucinex and delsym cough syrup   Return if no improvement in 7-10 days

## 2018-08-31 NOTE — Assessment & Plan Note (Addendum)
-   Asthma exacerbation d/t acute bronchitis  - Productive cough with colored sputum and sob x 3 days - LS with coarse exp wheeze - Given depo-medrol 80mg  IM x1 today - Start Zpack and prednisone taper  - Continue mucinex and delsym OTC twice daily - Return/call if no improvement in 5-7 days

## 2018-08-31 NOTE — Progress Notes (Signed)
_0  ID: [name] [name], female    DOB: 1946-01-10, 72 y.o.   MRN: [id number]  Chief Complaint  Patient presents with  . Acute Visit    wheezing and cough with green mucus x3 Days    Referring provider: Biagio Borg, MD  HPI: 72 year old female, former smoker. PMH asthma, bronchitis, GERD. Patient of Dr. Elsworth Soho, last seen by pulmonary NP on 05/14/18 for moderate persistent asthma exacerbation. Maintained on Symbicort 160 twice daily. Seen twice this year for exacerbation symptoms.   08/31/2018 Patient presents today with complaints of productive cough with green sputum x2 days. Associated sob and wheezing. She has taken mucinex and delsym cough syrup. Using Symbicort as prescribed twice a day. She has been using her rescue inhaler x3/day over the past two days. Up to date with flu shot. Denies fever, chills, s/t, nasal congestion.   Significant testing reviewed: 2014 Spirometry >>FEv1 84% CT chest 01/2016 3 mm RLL nodules  Allergies  Allergen Reactions  . Simvastatin Other (See Comments)    Muscle spasms    Immunization History  Administered Date(s) Administered  . H1N1 10/13/2008  . Influenza Split 07/31/2012  . Influenza Whole 09/12/2007, 09/12/2008, 08/20/2009, 08/26/2011  . Influenza, High Dose Seasonal PF 08/26/2014, 08/30/2016, 08/24/2017, 08/24/2018  . Influenza, Seasonal, Injecte, Preservative Fre 08/26/2013  . Influenza-Unspecified 08/27/2015  . Pneumococcal Conjugate-13 10/31/2014  . Pneumococcal Polysaccharide-23 12/21/2010  . Td 03/22/1995, 09/20/2007  . Tdap 11/22/2017    Past Medical History:  Diagnosis Date  . ARTHRITIS 01/30/2008  . ASTHMA 09/03/2007  . Cancer ([place]) 1987   palate cancer  . COLON CANCER 04/07/2008   pt states she had colon cancer 1987  . COPD 09/03/2007  . COPD (chronic obstructive pulmonary disease) ([place]) 04/22/2013  . CORONARY ARTERY DISEASE 09/03/2007  . DEGENERATIVE JOINT DISEASE 01/30/2008  . DIABETES MELLITUS 01/30/2008  .  Diabetic peripheral neuropathy ([place]) 04/22/2013  . Eczema   . GERD 09/03/2007  . GLUCOSE INTOLERANCE 09/03/2007  . GOITER, MULTINODULAR 05/21/2010  . History of blood transfusion   . HYPERCHOLESTEROLEMIA 09/03/2007  . HYPERTENSION 09/03/2007  . Impaired glucose tolerance 04/10/2011  . LVH (left ventricular hypertrophy)   . OBESITY 09/03/2007  . Osteopenia   . Psoriasis   . Shortness of breath dyspnea    With exertion and allergies to pollen    Tobacco History: Social History   Tobacco Use  Smoking Status Former Smoker  . Packs/day: 1.00  . Years: 15.00  . Pack years: 15.00  . Types: Cigarettes  . Last attempt to quit: 12/12/1978  . Years since quitting: 39.7  Smokeless Tobacco Never Used   Counseling given: Not Answered   Outpatient Medications Prior to Visit  Medication Sig Dispense Refill  . acyclovir cream (ZOVIRAX) 5 % Apply 1 application topically daily as needed. For psoriasis    . albuterol (PROVENTIL HFA;VENTOLIN HFA) 108 (90 Base) MCG/ACT inhaler Inhale into the lungs.    Marland Kitchen amLODipine (NORVASC) 5 MG tablet Take 1 tablet (5 mg total) by mouth daily. 90 tablet 3  . aspirin EC 81 MG tablet Take 1 tablet (81 mg total) by mouth daily. 90 tablet 11  . Blood Glucose Monitoring Suppl (ONE TOUCH ULTRA SYSTEM KIT) W/DEVICE KIT 1 kit by Does not apply route once. 250.02 1 each 0  . budesonide-formoterol (SYMBICORT) 80-4.5 MCG/ACT inhaler Inhale 2 puffs into the lungs 2 (two) times daily. 1 Inhaler 4  . clobetasol (TEMOVATE) 0.05 % ointment Use twice once daily to  hand and feet as needed (Patient taking differently: Apply 1 application topically 2 (two) times daily. Use twice once daily to hand and feet as needed) 30 g 1  . hydrochlorothiazide (HYDRODIURIL) 25 MG tablet Take 1 tablet (25 mg total) by mouth daily. 90 tablet 3  . loratadine-pseudoephedrine (CLARITIN-D 24-HOUR) 10-240 MG per 24 hr tablet Take 1 tablet by mouth daily.    Marland Kitchen lovastatin (MEVACOR) 40 MG tablet Take 2 tablets  (80 mg total) by mouth daily. 180 tablet 3  . metFORMIN (GLUCOPHAGE-XR) 500 MG 24 hr tablet Take 1 tablet (500 mg total) by mouth daily. 90 tablet 3  . methimazole (TAPAZOLE) 5 MG tablet Take 1 tablet (5 mg total) by mouth daily. 90 tablet 1  . metoprolol tartrate (LOPRESSOR) 25 MG tablet TAKE 1 & 1/2 TABLETS TWICE A DAY 270 tablet 3  . naproxen (NAPROSYN) 500 MG tablet TAKE 1 TABLET (500 MG TOTAL) BY MOUTH 2 (TWO) TIMES DAILY WITH A MEAL. 60 tablet 2  . omeprazole (PRILOSEC) 20 MG capsule TAKE 1 CAPSULE BY MOUTH EVERY DAY 90 capsule 3  . ONE TOUCH ULTRA TEST test strip TEST ONCE DAILY. E11.9 100 each 2  . ONETOUCH DELICA LANCETS 62X MISC USE TO CHECK BLOOD SUGAR ONCE DAILY 100 each 1  . tiZANidine (ZANAFLEX) 4 MG tablet Take 1 tablet (4 mg total) by mouth every 6 (six) hours as needed for muscle spasms. 60 tablet 1  . traMADol (ULTRAM) 50 MG tablet TAKE 1 TABELT BY MOUTH EVERY 6 HOURS AS NEEDED FOR PAIN 120 tablet 1   No facility-administered medications prior to visit.     Review of Systems  Review of Systems  Constitutional: Negative.   Respiratory: Positive for cough, shortness of breath, wheezing and stridor. Negative for apnea, choking and chest tightness.   Cardiovascular: Negative.     Physical Exam  BP 98/62 (BP Location: Left Arm, Cuff Size: Normal)   Pulse 73   Ht 5' 2" (1.575 m)   Wt 132 lb (59.9 kg)   SpO2 97%   BMI 24.14 kg/m  Physical Exam  Constitutional: She is oriented to person, place, and time. She appears well-developed and well-nourished. No distress.  HENT:  Head: Normocephalic and atraumatic.  Mouth/Throat: Oropharynx is clear and moist and mucous membranes are normal.  Eyes: Pupils are equal, round, and reactive to light. EOM are normal.  Neck: Normal range of motion. Neck supple.  Cardiovascular: Normal rate and regular rhythm.  Pulmonary/Chest: Effort normal. She has wheezes.  LS with coarse exp wheeze. No resp distress.   Musculoskeletal: Normal  range of motion.  Neurological: She is alert and oriented to person, place, and time.  Skin: Skin is warm and dry.  Psychiatric: She has a normal mood and affect. Her behavior is normal. Judgment and thought content normal.     Lab Results:  CBC    Component Value Date/Time   WBC 6.6 11/13/2017 0958   RBC 4.65 11/13/2017 0958   HGB 11.9 (L) 11/13/2017 0958   HCT 37.4 11/13/2017 0958   PLT 223.0 11/13/2017 0958   MCV 80.3 11/13/2017 0958   MCH 25.1 (L) 02/08/2016 0722   MCHC 32.0 11/13/2017 0958   RDW 17.0 (H) 11/13/2017 0958   LYMPHSABS 2.1 11/13/2017 0958   MONOABS 0.6 11/13/2017 0958   EOSABS 0.3 11/13/2017 0958   BASOSABS 0.0 11/13/2017 0958    BMET    Component Value Date/Time   NA 140 05/14/2018 1531   K 3.3 (  L) 05/14/2018 1531   CL 100 05/14/2018 1531   CO2 30 05/14/2018 1531   GLUCOSE 101 (H) 05/14/2018 1531   BUN 14 05/14/2018 1531   CREATININE 0.69 05/14/2018 1531   CALCIUM 9.9 05/14/2018 1531   GFRNONAA >60 02/08/2016 0722   GFRAA >60 02/08/2016 0722    BNP No results found for: BNP  ProBNP No results found for: PROBNP  Imaging: No results found.   Assessment & Plan:   Asthmatic bronchitis - Asthma exacerbation d/t acute bronchitis  - Productive cough with colored sputum and sob x 3 days - LS with coarse exp wheeze - Given depo-medrol 5m IM x1 today - Start Zpack and prednisone taper  - Continue mucinex and delsym OTC twice daily - Return/call if no improvement in 5-7 days    EMartyn Ehrich NP 08/31/2018

## 2018-09-18 DIAGNOSIS — H9011 Conductive hearing loss, unilateral, right ear, with unrestricted hearing on the contralateral side: Secondary | ICD-10-CM | POA: Diagnosis not present

## 2018-10-02 DIAGNOSIS — H6123 Impacted cerumen, bilateral: Secondary | ICD-10-CM | POA: Insufficient documentation

## 2018-10-02 DIAGNOSIS — H6121 Impacted cerumen, right ear: Secondary | ICD-10-CM | POA: Diagnosis not present

## 2018-10-18 DIAGNOSIS — H6981 Other specified disorders of Eustachian tube, right ear: Secondary | ICD-10-CM | POA: Insufficient documentation

## 2018-10-18 DIAGNOSIS — H9011 Conductive hearing loss, unilateral, right ear, with unrestricted hearing on the contralateral side: Secondary | ICD-10-CM | POA: Diagnosis not present

## 2018-10-18 DIAGNOSIS — H6521 Chronic serous otitis media, right ear: Secondary | ICD-10-CM | POA: Diagnosis not present

## 2018-10-18 DIAGNOSIS — H90A12 Conductive hearing loss, unilateral, left ear with restricted hearing on the contralateral side: Secondary | ICD-10-CM | POA: Diagnosis not present

## 2018-10-18 DIAGNOSIS — H90A31 Mixed conductive and sensorineural hearing loss, unilateral, right ear with restricted hearing on the contralateral side: Secondary | ICD-10-CM | POA: Diagnosis not present

## 2018-10-24 ENCOUNTER — Other Ambulatory Visit: Payer: Self-pay | Admitting: Internal Medicine

## 2018-10-24 ENCOUNTER — Other Ambulatory Visit: Payer: Self-pay

## 2018-10-24 NOTE — Patient Outreach (Signed)
[facility]) Care Management  10/24/2018  ADEOLA DENNEN 1946-06-27 [id number]   Medication Adherence call to Mrs. Fabiana Dromgoole spoke with patient she pick up Lovastatin 40 mg  from [facility] a couple of days ago for a 90 days supply patient is not due until January/2020. Mrs. Abundis is showing past due under [place].   [facility] Management Direct Dial [phone]  Fax [phone] [email or web address]

## 2018-11-16 ENCOUNTER — Other Ambulatory Visit (INDEPENDENT_AMBULATORY_CARE_PROVIDER_SITE_OTHER): Payer: Medicare Other

## 2018-11-16 ENCOUNTER — Other Ambulatory Visit: Payer: Self-pay | Admitting: Internal Medicine

## 2018-11-16 DIAGNOSIS — E1140 Type 2 diabetes mellitus with diabetic neuropathy, unspecified: Secondary | ICD-10-CM | POA: Diagnosis not present

## 2018-11-16 LAB — BASIC METABOLIC PANEL
BUN: 18 mg/dL (ref 6–23)
CHLORIDE: 104 meq/L (ref 96–112)
CO2: 29 mEq/L (ref 19–32)
Calcium: 9.6 mg/dL (ref 8.4–10.5)
Creatinine, Ser: 0.82 mg/dL (ref 0.40–1.20)
GFR: 87.83 mL/min (ref >60.00)
Glucose, Bld: 107 mg/dL — ABNORMAL HIGH (ref 70–99)
Potassium: 3.6 mEq/L (ref 3.5–5.1)
Sodium: 142 mEq/L (ref 135–145)

## 2018-11-16 LAB — URINALYSIS, ROUTINE W REFLEX MICROSCOPIC
BILIRUBIN URINE: NEGATIVE
Hgb urine dipstick: NEGATIVE
Ketones, ur: NEGATIVE
Leukocytes, UA: NEGATIVE
Nitrite: NEGATIVE
Specific Gravity, Urine: 1.02 (ref 1.000–1.030)
Total Protein, Urine: NEGATIVE
Urine Glucose: NEGATIVE
Urobilinogen, UA: 0.2 (ref 0.0–1.0)
pH: 7 (ref 5.0–8.0)

## 2018-11-16 LAB — LIPID PANEL
CHOLESTEROL: 149 mg/dL (ref 0–200)
HDL: 61.2 mg/dL (ref >39.00)
LDL CALC: 76 mg/dL (ref 0–99)
NonHDL: 87.53
Total CHOL/HDL Ratio: 2
Triglycerides: 59 mg/dL (ref 0.0–149.0)
VLDL: 11.8 mg/dL (ref 0.0–40.0)

## 2018-11-16 LAB — CBC WITH DIFFERENTIAL/PLATELET
Basophils Absolute: 0 10*3/uL (ref 0.0–0.1)
Basophils Relative: 0.3 % (ref 0.0–3.0)
Eosinophils Absolute: 0.2 10*3/uL (ref 0.0–0.7)
Eosinophils Relative: 3 % (ref 0.0–5.0)
HCT: 41.9 % (ref 36.0–46.0)
Hemoglobin: 13.4 g/dL (ref 12.0–15.0)
LYMPHS PCT: 25 % (ref 12.0–46.0)
Lymphs Abs: 2 10*3/uL (ref 0.7–4.0)
MCHC: 32 g/dL (ref 30.0–36.0)
MCV: 81.9 fl (ref 78.0–100.0)
Monocytes Absolute: 0.7 10*3/uL (ref 0.1–1.0)
Monocytes Relative: 8.8 % (ref 3.0–12.0)
Neutro Abs: 5 10*3/uL (ref 1.4–7.7)
Neutrophils Relative %: 62.9 % (ref 43.0–77.0)
PLATELETS: 245 10*3/uL (ref 150.0–400.0)
RBC: 5.11 Mil/uL (ref 3.87–5.11)
RDW: 16.6 % — ABNORMAL HIGH (ref 11.5–15.5)
WBC: 8 10*3/uL (ref 4.0–10.5)

## 2018-11-16 LAB — HEPATIC FUNCTION PANEL
ALT: 11 U/L (ref 0–35)
AST: 18 U/L (ref 0–37)
Albumin: 4.1 g/dL (ref 3.5–5.2)
Alkaline Phosphatase: 62 U/L (ref 39–117)
Bilirubin, Direct: 0.2 mg/dL (ref 0.0–0.3)
Total Bilirubin: 0.6 mg/dL (ref 0.2–1.2)
Total Protein: 6.6 g/dL (ref 6.0–8.3)

## 2018-11-16 LAB — MICROALBUMIN / CREATININE URINE RATIO
Creatinine,U: 146.4 mg/dL
Microalb Creat Ratio: 1 mg/g (ref 0.0–30.0)
Microalb, Ur: 1.4 mg/dL (ref 0.0–1.9)

## 2018-11-16 LAB — HEMOGLOBIN A1C: Hgb A1c MFr Bld: 6.2 % (ref 4.6–6.5)

## 2018-11-16 LAB — TSH: TSH: 1.09 u[IU]/mL (ref 0.35–4.50)

## 2018-11-22 ENCOUNTER — Telehealth: Payer: Self-pay | Admitting: *Deleted

## 2018-11-22 NOTE — Telephone Encounter (Signed)
Called to schedule AWV, patient declined.

## 2018-11-23 ENCOUNTER — Ambulatory Visit (INDEPENDENT_AMBULATORY_CARE_PROVIDER_SITE_OTHER): Payer: Medicare Other | Admitting: Internal Medicine

## 2018-11-23 ENCOUNTER — Encounter: Payer: Self-pay | Admitting: Internal Medicine

## 2018-11-23 VITALS — BP 118/68 | HR 69 | Temp 98.1°F | Ht 62.0 in | Wt 131.0 lb

## 2018-11-23 DIAGNOSIS — Z0001 Encounter for general adult medical examination with abnormal findings: Secondary | ICD-10-CM | POA: Diagnosis not present

## 2018-11-23 DIAGNOSIS — J209 Acute bronchitis, unspecified: Secondary | ICD-10-CM | POA: Diagnosis not present

## 2018-11-23 DIAGNOSIS — I10 Essential (primary) hypertension: Secondary | ICD-10-CM | POA: Diagnosis not present

## 2018-11-23 DIAGNOSIS — E1142 Type 2 diabetes mellitus with diabetic polyneuropathy: Secondary | ICD-10-CM

## 2018-11-23 MED ORDER — HYDROCODONE-HOMATROPINE 5-1.5 MG/5ML PO SYRP: 5.0000 mL | ORAL_SOLUTION | Freq: Every evening | ORAL | 0 refills | Status: DC | PRN

## 2018-11-23 MED ORDER — DOXYCYCLINE HYCLATE 100 MG PO TABS: 100.0000 mg | ORAL_TABLET | Freq: Two times a day (BID) | ORAL | 0 refills | Status: DC

## 2018-11-23 MED ORDER — TRAMADOL HCL 50 MG PO TABS: ORAL_TABLET | ORAL | 5 refills | Status: DC

## 2018-11-23 NOTE — Assessment & Plan Note (Signed)

## 2018-11-23 NOTE — Assessment & Plan Note (Signed)
stable overall by history and exam, recent data reviewed with pt, and pt to continue medical treatment as before,  to f/u any worsening symptoms or concerns  

## 2018-11-23 NOTE — Assessment & Plan Note (Addendum)
Mild to mod, declines cxr, c/w bronchitis vs pna, for antibx course, cough med prn, to f/u any worsening symptoms or concerns  Note:  Total time for pt hx, exam, review of record with pt in the room, determination of diagnoses and plan for further eval and tx is > 40 min, with over 50% spent in coordination and counseling of patient including the differential dx, tx, further evaluation and other management of acute bronchitis, HTN, DM with neuropathy

## 2018-11-23 NOTE — Assessment & Plan Note (Signed)
With chronic pain, needs tramadol prn approved by PA as is medically necessary

## 2018-11-23 NOTE — Patient Instructions (Signed)
Please take all new medication as prescribed - the antibiotic, and cough medicine  Please continue all other medications as before, and refills have been done if requested - the tramadol for neuropathy and back pain  Please have the pharmacy call with any other refills you may need.  Please continue your efforts at being more active, low cholesterol diet, and weight control.  You are otherwise up to date with prevention measures today.  Please keep your appointments with your specialists as you may have planned  Please return in 6 months, or sooner if needed

## 2018-11-23 NOTE — Progress Notes (Signed)
Subjective:    Patient ID: [name] [name], female    DOB: May 01, 1946, 72 y.o.   MRN: [id number]  HPI    Here for wellness and f/u;  Overall doing ok;  Pt denies Chest pain, worsening SOB, DOE, wheezing, orthopnea, PND, worsening LE edema, palpitations, dizziness or syncope.  Pt denies neurological change such as new headache, facial or extremity weakness.  Pt denies polydipsia, polyuria, or low sugar symptoms. Pt states overall good compliance with treatment and medications, good tolerability, and has been trying to follow appropriate diet.  Pt denies worsening depressive symptoms, suicidal ideation or panic. No fever, night sweats, wt loss, loss of appetite, or other constitutional symptoms.  Pt states good ability with ADL's, has low fall risk, home safety reviewed and adequate, no other significant changes in hearing or vision, and only occasionally active with exercise.  Peak wt prior to cancer tx was about 187.  No new complaints  Pt continues to have recurring LBP without change in severity, bowel or bladder change, fever, wt loss,  worsening LE pain/numbness/weakness, gait change or falls.   Wt Readings from Last 3 Encounters:  11/23/18 131 lb (59.4 kg)  08/31/18 132 lb (59.9 kg)  08/24/18 132 lb 3.2 oz (60 kg)  Also incidentally - Here with acute onset mild to mod 2-3 days ST, HA, general weakness and malaise, with prod cough greenish sputum, but Pt denies chest pain, increased so.  Pt continues to have recurring LBP without change in severity, bowel or bladder change, fever, wt loss,  worsening LE pain/numbness/weakness, gait change or falls.  Also has chronic neuropathy and needs the tramadol as is medically necessary Past Medical History:  Diagnosis Date  . ARTHRITIS 01/30/2008  . ASTHMA 09/03/2007  . Cancer ([place]) 1987   palate cancer  . COLON CANCER 04/07/2008   pt states she had colon cancer 1987  . COPD 09/03/2007  . COPD (chronic obstructive pulmonary disease) ([place]) 04/22/2013  .  CORONARY ARTERY DISEASE 09/03/2007  . DEGENERATIVE JOINT DISEASE 01/30/2008  . DIABETES MELLITUS 01/30/2008  . Diabetic peripheral neuropathy ([place]) 04/22/2013  . Eczema   . GERD 09/03/2007  . GLUCOSE INTOLERANCE 09/03/2007  . GOITER, MULTINODULAR 05/21/2010  . History of blood transfusion   . HYPERCHOLESTEROLEMIA 09/03/2007  . HYPERTENSION 09/03/2007  . Impaired glucose tolerance 04/10/2011  . LVH (left ventricular hypertrophy)   . OBESITY 09/03/2007  . Osteopenia   . Psoriasis   . Shortness of breath dyspnea    With exertion and allergies to pollen   Past Surgical History:  Procedure Laterality Date  . ABDOMINAL HYSTERECTOMY    . CARDIAC CATHETERIZATION    . COLON SURGERY    . FLOOR OF MOUTH BIOPSY N/A 12/24/2015   Procedure: ORAL BIOPSY;  Surgeon: Izora Gala, MD;  Location: [facility];  Service: ENT;  Laterality: N/A;  . HEMICOLECTOMY  12/1985   cancer  . HERNIA REPAIR    . MAXILLECTOMY Right 01/22/2016   Procedure: RIGHT SIDE PARTIAL MAXILLECTOMY;  Surgeon: Izora Gala, MD;  Location: [facility];  Service: ENT;  Laterality: Right;  . TUBAL LIGATION      reports that she quit smoking about 39 years ago. Her smoking use included cigarettes. She has a 15.00 pack-year smoking history. She has never used smokeless tobacco. She reports that she does not drink alcohol or use drugs. family history includes Cancer in her sister and another family member; Colon cancer in her paternal aunt and another family member; Heart disease  in her mother; Liver disease in her father; Thyroid disease in her brother and sister. Allergies  Allergen Reactions  . Simvastatin Other (See Comments)    Muscle spasms   Current Outpatient Medications on File Prior to Visit  Medication Sig Dispense Refill  . acyclovir cream (ZOVIRAX) 5 % Apply 1 application topically daily as needed. For psoriasis    . albuterol (PROVENTIL HFA;VENTOLIN HFA) 108 (90 Base) MCG/ACT inhaler Inhale into the lungs.    Marland Kitchen amLODipine (NORVASC) 5 MG  tablet Take 1 tablet (5 mg total) by mouth daily. 90 tablet 3  . aspirin EC 81 MG tablet Take 1 tablet (81 mg total) by mouth daily. 90 tablet 11  . Blood Glucose Monitoring Suppl (ONE TOUCH ULTRA SYSTEM KIT) W/DEVICE KIT 1 kit by Does not apply route once. 250.02 1 each 0  . budesonide-formoterol (SYMBICORT) 80-4.5 MCG/ACT inhaler Inhale 2 puffs into the lungs 2 (two) times daily. 1 Inhaler 4  . clobetasol (TEMOVATE) 0.05 % ointment Use twice once daily to hand and feet as needed (Patient taking differently: Apply 1 application topically 2 (two) times daily. Use twice once daily to hand and feet as needed) 30 g 1  . hydrochlorothiazide (HYDRODIURIL) 25 MG tablet Take 1 tablet (25 mg total) by mouth daily. 90 tablet 3  . HYDROcodone-homatropine (HYCODAN) 5-1.5 MG/5ML syrup Take 5 mLs by mouth at bedtime and may repeat dose one time if needed. 120 mL 0  . loratadine-pseudoephedrine (CLARITIN-D 24-HOUR) 10-240 MG per 24 hr tablet Take 1 tablet by mouth daily.    Marland Kitchen lovastatin (MEVACOR) 40 MG tablet Take 2 tablets (80 mg total) by mouth daily. 180 tablet 3  . metFORMIN (GLUCOPHAGE-XR) 500 MG 24 hr tablet Take 1 tablet (500 mg total) by mouth daily. 90 tablet 3  . methimazole (TAPAZOLE) 5 MG tablet Take 1 tablet (5 mg total) by mouth daily. 90 tablet 1  . metoprolol tartrate (LOPRESSOR) 25 MG tablet TAKE 1 & 1/2 TABLETS TWICE A DAY 270 tablet 3  . naproxen (NAPROSYN) 500 MG tablet TAKE 1 TABLET (500 MG TOTAL) BY MOUTH 2 (TWO) TIMES DAILY WITH A MEAL. 180 tablet 0  . omeprazole (PRILOSEC) 20 MG capsule TAKE 1 CAPSULE BY MOUTH EVERY DAY 90 capsule 3  . ONE TOUCH ULTRA TEST test strip TEST ONCE DAILY. E11.9 100 each 2  . tiZANidine (ZANAFLEX) 4 MG tablet Take 1 tablet (4 mg total) by mouth every 6 (six) hours as needed for muscle spasms. 60 tablet 1   No current facility-administered medications on file prior to visit.    Review of Systems Constitutional: Negative for other unusual diaphoresis, sweats,  appetite or weight changes HENT: Negative for other worsening hearing loss, ear pain, facial swelling, mouth sores or neck stiffness.   Eyes: Negative for other worsening pain, redness or other visual disturbance.  Respiratory: Negative for other stridor or swelling Cardiovascular: Negative for other palpitations or other chest pain  Gastrointestinal: Negative for worsening diarrhea or loose stools, blood in stool, distention or other pain Genitourinary: Negative for hematuria, flank pain or other change in urine volume.  Musculoskeletal: Negative for myalgias or other joint swelling.  Skin: Negative for other color change, or other wound or worsening drainage.  Neurological: Negative for other syncope or numbness. Hematological: Negative for other adenopathy or swelling Psychiatric/Behavioral: Negative for hallucinations, other worsening agitation, SI, self-injury, or new decreased concentration All other system neg per pt    Objective:   Physical Exam BP 118/68  Pulse 69   Temp 98.1 F (36.7 C) (Oral)   Ht 5' 2" (1.575 m)   Wt 131 lb (59.4 kg)   SpO2 95%   BMI 23.96 kg/m  VS noted, mild ill Constitutional: Pt is oriented to person, place, and time. Appears well-developed and well-nourished, in no significant distress and comfortable Head: Normocephalic and atraumatic  Eyes: Conjunctivae and EOM are normal. Pupils are equal, round, and reactive to light Right Ear: External ear normal without discharge Left Ear: External ear normal without discharge Nose: Nose without discharge or deformity Mouth/Throat: Oropharynx is without other ulcerations and moist  Neck: Normal range of motion. Neck supple. No JVD present. No tracheal deviation present or significant neck LA or mass Cardiovascular: Normal rate, regular rhythm, normal heart sounds and intact distal pulses.   Pulmonary/Chest: WOB normal and breath sounds decreased without rales but with few exp wheezing  Abdominal: Soft.  Bowel sounds are normal. NT. No HSM  Musculoskeletal: Normal range of motion. Exhibits no edema Lymphadenopathy: Has no other cervical adenopathy.  Neurological: Pt is alert and oriented to person, place, and time. Pt has normal reflexes. No cranial nerve deficit. Motor grossly intact, Gait intact Skin: Skin is warm and dry. No rash noted or new ulcerations Psychiatric:  Has normal mood and affect. Behavior is normal without agitation No other exam findings Lab Results  Component Value Date   WBC 8.0 11/16/2018   HGB 13.4 11/16/2018   HCT 41.9 11/16/2018   PLT 245.0 11/16/2018   GLUCOSE 107 (H) 11/16/2018   CHOL 149 11/16/2018   TRIG 59.0 11/16/2018   HDL 61.20 11/16/2018   LDLCALC 76 11/16/2018   ALT 11 11/16/2018   AST 18 11/16/2018   NA 142 11/16/2018   K 3.6 11/16/2018   CL 104 11/16/2018   CREATININE 0.82 11/16/2018   BUN 18 11/16/2018   CO2 29 11/16/2018   TSH 1.09 11/16/2018   INR 1.04 02/08/2016   HGBA1C 6.2 11/16/2018   MICROALBUR 1.4 11/16/2018      Assessment & Plan:

## 2018-12-04 ENCOUNTER — Other Ambulatory Visit: Payer: Self-pay | Admitting: Endocrinology

## 2018-12-15 ENCOUNTER — Other Ambulatory Visit: Payer: Self-pay | Admitting: Internal Medicine

## 2019-01-07 ENCOUNTER — Other Ambulatory Visit: Payer: Self-pay | Admitting: Internal Medicine

## 2019-01-14 ENCOUNTER — Encounter: Payer: Self-pay | Admitting: Adult Health

## 2019-01-14 ENCOUNTER — Ambulatory Visit (INDEPENDENT_AMBULATORY_CARE_PROVIDER_SITE_OTHER): Payer: Medicare Other | Admitting: Adult Health

## 2019-01-14 ENCOUNTER — Ambulatory Visit: Payer: Medicare Other | Admitting: Adult Health

## 2019-01-14 DIAGNOSIS — J309 Allergic rhinitis, unspecified: Secondary | ICD-10-CM

## 2019-01-14 DIAGNOSIS — J45909 Unspecified asthma, uncomplicated: Secondary | ICD-10-CM

## 2019-01-14 NOTE — Progress Notes (Signed)
_0  ID: [name] [name], female    DOB: [birth year], 73 y.o.   MRN: [id number]  Chief Complaint  Patient presents with  . Follow-up    COPD     Referring provider: Biagio Borg, MD  HPI: 73 year old female former smoker followed for asthmatic bronchitis complicated by GERD and suspected component of vocal cord dysfunction.   Lung nodules on CT chest without significant change on serial CTs  History significant for oral cancer diagnosed in 2016  TEST/EVENTS :  2014 Spirometry >>FEv1 84% CT chest 01/2016 3 mm RLL nodules CT chest 02/21/2017 3 subpleural right lower lobe pulmonary nodules measuring 3 mm unchanged since 2017 consistent with benign process  01/14/2019 Follow up : Asthma  Patient presents for a 4 month follow up . Says Asthma has been well controlled since last visit except for URI in December.  . Remains on Symbicort 80 Twice daily  And Claritin daily  . Denies Cough or wheezing .  Says she got bronchitis in December, seen by PCP treated with Doxycycline. Did not require steroids . Feels she did fine and has had not had any issues since then,  Has some nasal drainage , controlled with Claritin daily .  No fever , chest pain, orthopnea, edema or fever.   Flu, PVX and Prevnar are utd.     Allergies  Allergen Reactions  . Simvastatin Other (See Comments)    Muscle spasms    Immunization History  Administered Date(s) Administered  . H1N1 10/13/2008  . Influenza Split 07/31/2012  . Influenza Whole 09/12/2007, 09/12/2008, 08/20/2009, 08/26/2011  . Influenza, High Dose Seasonal PF 08/26/2014, 08/30/2016, 08/24/2017, 08/24/2018  . Influenza, Seasonal, Injecte, Preservative Fre 08/26/2013  . Influenza-Unspecified 08/27/2015  . Pneumococcal Conjugate-13 10/31/2014  . Pneumococcal Polysaccharide-23 12/21/2010  . Td 03/22/1995, 09/20/2007  . Tdap 11/22/2017    Past Medical History:  Diagnosis Date  . ARTHRITIS 01/30/2008  . ASTHMA 09/03/2007  . Cancer ([place])  1987   palate cancer  . COLON CANCER 04/07/2008   pt states she had colon cancer 1987  . COPD 09/03/2007  . COPD (chronic obstructive pulmonary disease) ([place]) 04/22/2013  . CORONARY ARTERY DISEASE 09/03/2007  . DEGENERATIVE JOINT DISEASE 01/30/2008  . DIABETES MELLITUS 01/30/2008  . Diabetic peripheral neuropathy ([place]) 04/22/2013  . Eczema   . GERD 09/03/2007  . GLUCOSE INTOLERANCE 09/03/2007  . GOITER, MULTINODULAR 05/21/2010  . History of blood transfusion   . HYPERCHOLESTEROLEMIA 09/03/2007  . HYPERTENSION 09/03/2007  . Impaired glucose tolerance 04/10/2011  . LVH (left ventricular hypertrophy)   . OBESITY 09/03/2007  . Osteopenia   . Psoriasis   . Shortness of breath dyspnea    With exertion and allergies to pollen    Tobacco History: Social History   Tobacco Use  Smoking Status Former Smoker  . Packs/day: 1.00  . Years: 15.00  . Pack years: 15.00  . Types: Cigarettes  . Last attempt to quit: 12/12/1978  . Years since quitting: 40.1  Smokeless Tobacco Never Used   Counseling given: Not Answered   Outpatient Medications Prior to Visit  Medication Sig Dispense Refill  . acyclovir cream (ZOVIRAX) 5 % Apply 1 application topically daily as needed. For psoriasis    . albuterol (PROVENTIL HFA;VENTOLIN HFA) 108 (90 Base) MCG/ACT inhaler Inhale into the lungs.    Marland Kitchen amLODipine (NORVASC) 5 MG tablet TAKE 1 TABLET BY MOUTH EVERY DAY 90 tablet 1  . aspirin EC 81 MG tablet Take 1 tablet (81 mg total)  by mouth daily. 90 tablet 11  . Blood Glucose Monitoring Suppl (ONE TOUCH ULTRA SYSTEM KIT) W/DEVICE KIT 1 kit by Does not apply route once. 250.02 1 each 0  . budesonide-formoterol (SYMBICORT) 80-4.5 MCG/ACT inhaler Inhale 2 puffs into the lungs 2 (two) times daily. 1 Inhaler 4  . clobetasol (TEMOVATE) 0.05 % ointment Use twice once daily to hand and feet as needed (Patient taking differently: Apply 1 application topically 2 (two) times daily. Use twice once daily to hand and feet as needed) 30  g 1  . hydrochlorothiazide (HYDRODIURIL) 25 MG tablet Take 1 tablet (25 mg total) by mouth daily. 90 tablet 3  . loratadine-pseudoephedrine (CLARITIN-D 24-HOUR) 10-240 MG per 24 hr tablet Take 1 tablet by mouth daily.    Marland Kitchen lovastatin (MEVACOR) 40 MG tablet Take 2 tablets (80 mg total) by mouth daily. 180 tablet 3  . metFORMIN (GLUCOPHAGE-XR) 500 MG 24 hr tablet Take 1 tablet (500 mg total) by mouth daily. 90 tablet 3  . methimazole (TAPAZOLE) 5 MG tablet TAKE 1 TABLET BY MOUTH EVERY DAY 90 tablet 1  . metoprolol tartrate (LOPRESSOR) 25 MG tablet TAKE 1 & 1/2 TABLETS TWICE A DAY 270 tablet 1  . naproxen (NAPROSYN) 500 MG tablet TAKE 1 TABLET (500 MG TOTAL) BY MOUTH 2 (TWO) TIMES DAILY WITH A MEAL. 180 tablet 0  . omeprazole (PRILOSEC) 20 MG capsule TAKE 1 CAPSULE BY MOUTH EVERY DAY 90 capsule 3  . ONE TOUCH ULTRA TEST test strip TEST ONCE DAILY. E11.9 100 each 2  . tiZANidine (ZANAFLEX) 4 MG tablet Take 1 tablet (4 mg total) by mouth every 6 (six) hours as needed for muscle spasms. 60 tablet 1  . traMADol (ULTRAM) 50 MG tablet TAKE 1 TABELT BY MOUTH EVERY 6 HOURS AS NEEDED FOR PAIN 120 tablet 5  . doxycycline (VIBRA-TABS) 100 MG tablet Take 1 tablet (100 mg total) by mouth 2 (two) times daily. (Patient not taking: Reported on 01/14/2019) 20 tablet 0  . HYDROcodone-homatropine (HYCODAN) 5-1.5 MG/5ML syrup Take 5 mLs by mouth at bedtime and may repeat dose one time if needed. (Patient not taking: Reported on 01/14/2019) 120 mL 0   No facility-administered medications prior to visit.      Review of Systems:   Constitutional:   No  weight loss, night sweats,  Fevers, chills, fatigue, or  lassitude.  HEENT:   No headaches,  Difficulty swallowing,  Tooth/dental problems, or  Sore throat,                No sneezing, itching, ear ache,  +nasal congestion, post nasal drip,   CV:  No chest pain,  Orthopnea, PND, swelling in lower extremities, anasarca, dizziness, palpitations, syncope.   GI  No  heartburn, indigestion, abdominal pain, nausea, vomiting, diarrhea, change in bowel habits, loss of appetite, bloody stools.   Resp: No shortness of breath with exertion or at rest.  No excess mucus, no productive cough,  No non-productive cough,  No coughing up of blood.  No change in color of mucus.  No wheezing.  No chest wall deformity  Skin: no rash or lesions.  GU: no dysuria, change in color of urine, no urgency or frequency.  No flank pain, no hematuria   MS:  No joint pain or swelling.  No decreased range of motion.  No back pain.    Physical Exam  BP 116/64 (BP Location: Left Arm, Cuff Size: Normal)   Pulse (!) 53   Ht 5'  2" (1.575 m)   Wt 133 lb 6.4 oz (60.5 kg)   SpO2 100%   BMI 24.40 kg/m   GEN: A/Ox3; pleasant , NAD, well nourished    HEENT:  [place]/AT,  EACs-clear, TMs-wnl, NOSE-clear, THROAT-clear, no lesions, no postnasal drip or exudate noted.   NECK:  Supple w/ fair ROM; no JVD; normal carotid impulses w/o bruits; no thyromegaly or nodules palpated; no lymphadenopathy.    RESP  Clear  P & A; w/o, wheezes/ rales/ or rhonchi. no accessory muscle use, no dullness to percussion  CARD:  RRR, no m/r/g, no peripheral edema, pulses intact, no cyanosis or clubbing.  GI:   Soft & nt; nml bowel sounds; no organomegaly or masses detected.   Musco: Warm bil, no deformities or joint swelling noted.   Neuro: alert, no focal deficits noted.    Skin: Warm, no lesions or rashes    Lab Results:  CBC  ProBNP No results found for: PROBNP  Imaging: No results found.    No flowsheet data found.  No results found for: NITRICOXIDE      Assessment & Plan:   Asthma Well controlled on current regimen  Discussed trigger control especially with spring time just around the corner   Plan  Patient Instructions  Continue on Symbicort 2 puffs Twice daily . Rinse well after use.  Claritin 66m daily .  May use Albuterol inhaler As needed   Please contact office for  sooner follow up if symptoms do not improve or worsen or seek emergency care  Follow up with Dr. AElsworth Sohoor  NP in 4 months and As needed          Allergic rhinitis Controlled on present regimen  Plan  Patient Instructions  Continue on Symbicort 2 puffs Twice daily . Rinse well after use.  Claritin 176mdaily .  May use Albuterol inhaler As needed   Please contact office for sooner follow up if symptoms do not improve or worsen or seek emergency care  Follow up with Dr. AlElsworth Sohor  NP in 4 months and As needed             TaRexene EdisonNP 01/14/2019

## 2019-01-14 NOTE — Patient Instructions (Signed)
Continue on Symbicort 2 puffs Twice daily . Rinse well after use.  Claritin 10mg  daily .  May use Albuterol inhaler As needed   Please contact office for sooner follow up if symptoms do not improve or worsen or seek emergency care  Follow up with Dr. Elsworth Soho or Nakenya Theall NP in 4 months and As needed

## 2019-01-14 NOTE — Assessment & Plan Note (Signed)
Controlled on present regimen  Plan  Patient Instructions  Continue on Symbicort 2 puffs Twice daily . Rinse well after use.  Claritin 10mg  daily .  May use Albuterol inhaler As needed   Please contact office for sooner follow up if symptoms do not improve or worsen or seek emergency care  Follow up with Dr. Elsworth Soho or Parrett NP in 4 months and As needed

## 2019-01-14 NOTE — Assessment & Plan Note (Signed)
Well controlled on current regimen  Discussed trigger control especially with spring time just around the corner   Plan  Patient Instructions  Continue on Symbicort 2 puffs Twice daily . Rinse well after use.  Claritin 10mg  daily .  May use Albuterol inhaler As needed   Please contact office for sooner follow up if symptoms do not improve or worsen or seek emergency care  Follow up with Dr. Elsworth Soho or Parrett NP in 4 months and As needed

## 2019-02-04 ENCOUNTER — Other Ambulatory Visit: Payer: Self-pay | Admitting: Internal Medicine

## 2019-02-04 NOTE — Telephone Encounter (Signed)
Requested medication (s) are due for refill today: yes  Requested medication (s) are on the active medication list: yes    Last refill: 06/30/2016  #60 1 refill  Future visit scheduled yes 05/24/2019 Dr. Jenny Reichmann  Notes to clinic:not delegated  Requested Prescriptions  Pending Prescriptions Disp Refills   tiZANidine (ZANAFLEX) 4 MG tablet 60 tablet 1    Sig: Take 1 tablet (4 mg total) by mouth every 6 (six) hours as needed for muscle spasms.     Not Delegated - Cardiovascular:  Alpha-2 Agonists - tizanidine Failed - 02/04/2019  2:54 PM      Failed - This refill cannot be delegated      Passed - Valid encounter within last 6 months    Recent Outpatient Visits          2 months ago Encounter for well adult exam with abnormal findings   [place] [facility], James W, MD   7 months ago Acute left-sided low back pain without sciatica   [place] John, James W, MD   8 months ago Uncomplicated asthma, unspecified asthma severity, unspecified whether persistent   [name], James W, MD   1 year ago Acute right-sided thoracic back pain   [place], Enid Baas, MD   1 year ago Preventative health care   [facility] -Georges Mouse, MD      Future Appointments            In 3 months Jenny Reichmann, Hunt Oris, MD [place], [place]   In 3 months Rigoberto Noel, MD [facility]

## 2019-02-04 NOTE — Telephone Encounter (Signed)
Copied from [place] [id number]. Topic: Quick Communication - Rx Refill/Question >> Feb 04, 2019  2:48 PM Rayann Heman wrote: Medication:tiZANidine (ZANAFLEX) 4 MG tablet [[id number]]   Has the patient contacted their pharmacy? no Preferred Pharmacy (with phone number or street name):[id number] - [place],  - [address] [phone] (Phone) [phone] (Fax)   Agent: Please be advised that RX refills may take up to 3 business days. We ask that you follow-up with your pharmacy.

## 2019-02-05 DIAGNOSIS — Z85819 Personal history of malignant neoplasm of unspecified site of lip, oral cavity, and pharynx: Secondary | ICD-10-CM | POA: Diagnosis not present

## 2019-02-05 MED ORDER — TIZANIDINE HCL 4 MG PO TABS: 4.0000 mg | ORAL_TABLET | Freq: Four times a day (QID) | ORAL | 1 refills | Status: DC | PRN

## 2019-02-10 ENCOUNTER — Other Ambulatory Visit: Payer: Self-pay | Admitting: Internal Medicine

## 2019-02-21 ENCOUNTER — Other Ambulatory Visit: Payer: Self-pay | Admitting: Internal Medicine

## 2019-02-25 ENCOUNTER — Ambulatory Visit: Payer: Medicare Other | Admitting: Endocrinology

## 2019-03-16 ENCOUNTER — Other Ambulatory Visit: Payer: Self-pay | Admitting: Internal Medicine

## 2019-04-20 ENCOUNTER — Other Ambulatory Visit: Payer: Self-pay | Admitting: Internal Medicine

## 2019-05-17 ENCOUNTER — Other Ambulatory Visit: Payer: Self-pay | Admitting: Internal Medicine

## 2019-05-17 DIAGNOSIS — Z1231 Encounter for screening mammogram for malignant neoplasm of breast: Secondary | ICD-10-CM

## 2019-05-24 ENCOUNTER — Ambulatory Visit: Payer: Medicare Other | Admitting: Internal Medicine

## 2019-06-03 ENCOUNTER — Ambulatory Visit: Payer: Medicare Other | Admitting: Pulmonary Disease

## 2019-06-04 ENCOUNTER — Other Ambulatory Visit: Payer: Self-pay | Admitting: Endocrinology

## 2019-06-05 ENCOUNTER — Other Ambulatory Visit: Payer: Self-pay

## 2019-06-05 ENCOUNTER — Other Ambulatory Visit: Payer: Self-pay | Admitting: Internal Medicine

## 2019-06-10 ENCOUNTER — Ambulatory Visit (INDEPENDENT_AMBULATORY_CARE_PROVIDER_SITE_OTHER): Payer: Medicare Other | Admitting: Endocrinology

## 2019-06-10 ENCOUNTER — Encounter: Payer: Self-pay | Admitting: Endocrinology

## 2019-06-10 ENCOUNTER — Other Ambulatory Visit: Payer: Self-pay

## 2019-06-10 VITALS — BP 124/60 | HR 81 | Ht 62.0 in | Wt 124.4 lb

## 2019-06-10 DIAGNOSIS — E0590 Thyrotoxicosis, unspecified without thyrotoxic crisis or storm: Secondary | ICD-10-CM

## 2019-06-10 LAB — T4, FREE: Free T4: 0.99 ng/dL (ref 0.60–1.60)

## 2019-06-10 LAB — TSH: TSH: 0.63 u[IU]/mL (ref 0.35–4.50)

## 2019-06-10 MED ORDER — METHIMAZOLE 5 MG PO TABS: 5.0000 mg | ORAL_TABLET | Freq: Every day | ORAL | 1 refills | Status: DC

## 2019-06-10 NOTE — Patient Instructions (Signed)
Blood tests are requested for you today.  We'll let you know about the results.  If ever you have fever while taking methimazole, stop it and call us, even if the reason is obvious, because of the risk of a rare side-effect.  Please come back for a follow-up appointment in 6 months.   

## 2019-06-10 NOTE — Progress Notes (Signed)
Subjective:    Patient ID: [name] [name], female    DOB: 02-17-1946, 73 y.o.   MRN: [id number]  HPI Pt returns for f/u of a small multinodular goiter and hyperthyroidism (dx'ed 2011; nodules have not met criteria for bx; f/u ultrasound in 2014 was unchanged; in 2017, CT showed normal thyroid; in 2016, TSH was more suppressed; she declined RAI, so she was then rx'ed tapazole). she feels well in general.  She takes tapazole 5 mg  QD, as rx'ed.    Past Medical History:  Diagnosis Date  . ARTHRITIS 01/30/2008  . ASTHMA 09/03/2007  . Cancer ([place]) 1987   palate cancer  . COLON CANCER 04/07/2008   pt states she had colon cancer 1987  . COPD 09/03/2007  . COPD (chronic obstructive pulmonary disease) ([place]) 04/22/2013  . CORONARY ARTERY DISEASE 09/03/2007  . DEGENERATIVE JOINT DISEASE 01/30/2008  . DIABETES MELLITUS 01/30/2008  . Diabetic peripheral neuropathy ([place]) 04/22/2013  . Eczema   . GERD 09/03/2007  . GLUCOSE INTOLERANCE 09/03/2007  . GOITER, MULTINODULAR 05/21/2010  . History of blood transfusion   . HYPERCHOLESTEROLEMIA 09/03/2007  . HYPERTENSION 09/03/2007  . Impaired glucose tolerance 04/10/2011  . LVH (left ventricular hypertrophy)   . OBESITY 09/03/2007  . Osteopenia   . Psoriasis   . Shortness of breath dyspnea    With exertion and allergies to pollen    Past Surgical History:  Procedure Laterality Date  . ABDOMINAL HYSTERECTOMY    . CARDIAC CATHETERIZATION    . COLON SURGERY    . FLOOR OF MOUTH BIOPSY N/A 12/24/2015   Procedure: ORAL BIOPSY;  Surgeon: Izora Gala, MD;  Location: [facility];  Service: ENT;  Laterality: N/A;  . HEMICOLECTOMY  12/1985   cancer  . HERNIA REPAIR    . MAXILLECTOMY Right 01/22/2016   Procedure: RIGHT SIDE PARTIAL MAXILLECTOMY;  Surgeon: Izora Gala, MD;  Location: [facility];  Service: ENT;  Laterality: Right;  . TUBAL LIGATION      Social History   Socioeconomic History  . Marital status: Married    Spouse name: Not on file  . Number of children: 4   . Years of education: Not on file  . Highest education level: Not on file  Occupational History  . Occupation: stay at home    Employer: UNEMPLOYED  Social Needs  . Financial resource strain: Not on file  . Food insecurity    Worry: Not on file    Inability: Not on file  . Transportation needs    Medical: Not on file    Non-medical: Not on file  Tobacco Use  . Smoking status: Former Smoker    Packs/day: 1.00    Years: 15.00    Pack years: 15.00    Types: Cigarettes    Quit date: 12/12/1978    Years since quitting: 40.5  . Smokeless tobacco: Never Used  Substance and Sexual Activity  . Alcohol use: No  . Drug use: No  . Sexual activity: Not on file  Lifestyle  . Physical activity    Days per week: Not on file    Minutes per session: Not on file  . Stress: Not on file  Relationships  . Social Herbalist on phone: Not on file    Gets together: Not on file    Attends religious service: Not on file    Active member of club or organization: Not on file    Attends meetings of clubs or organizations: Not  on file    Relationship status: Not on file  . Intimate partner violence    Fear of current or ex partner: Not on file    Emotionally abused: Not on file    Physically abused: Not on file    Forced sexual activity: Not on file  Other Topics Concern  . Not on file  Social History Narrative   1 child died with meningitis    Current Outpatient Medications on File Prior to Visit  Medication Sig Dispense Refill  . acyclovir cream (ZOVIRAX) 5 % Apply 1 application topically daily as needed. For psoriasis    . albuterol (PROVENTIL HFA;VENTOLIN HFA) 108 (90 Base) MCG/ACT inhaler Inhale into the lungs.    Marland Kitchen amLODipine (NORVASC) 5 MG tablet TAKE 1 TABLET BY MOUTH EVERY DAY 90 tablet 1  . aspirin EC 81 MG tablet Take 1 tablet (81 mg total) by mouth daily. 90 tablet 11  . Blood Glucose Monitoring Suppl (ONE TOUCH ULTRA SYSTEM KIT) W/DEVICE KIT 1 kit by Does not apply  route once. 250.02 1 each 0  . budesonide-formoterol (SYMBICORT) 80-4.5 MCG/ACT inhaler Inhale 2 puffs into the lungs 2 (two) times daily. 1 Inhaler 4  . clobetasol (TEMOVATE) 0.05 % ointment Use twice once daily to hand and feet as needed (Patient taking differently: Apply 1 application topically 2 (two) times daily. Use twice once daily to hand and feet as needed) 30 g 1  . hydrochlorothiazide (HYDRODIURIL) 25 MG tablet Take 1 tablet (25 mg total) by mouth daily. 90 tablet 3  . loratadine-pseudoephedrine (CLARITIN-D 24-HOUR) 10-240 MG per 24 hr tablet Take 1 tablet by mouth daily.    Marland Kitchen lovastatin (MEVACOR) 40 MG tablet TAKE 2 TABLETS BY MOUTH EVERY DAY 180 tablet 0  . metFORMIN (GLUCOPHAGE-XR) 500 MG 24 hr tablet TAKE 2 TABLETS BY MOUTH EVERY DAY 180 tablet 2  . metoprolol tartrate (LOPRESSOR) 25 MG tablet TAKE 1 & 1/2 TABLETS TWICE A DAY 270 tablet 1  . naproxen (NAPROSYN) 500 MG tablet TAKE 1 TABLET (500 MG TOTAL) BY MOUTH 2 (TWO) TIMES DAILY WITH A MEAL. 180 tablet 0  . omeprazole (PRILOSEC) 20 MG capsule TAKE 1 CAPSULE BY MOUTH EVERY DAY 90 capsule 1  . ONE TOUCH ULTRA TEST test strip TEST ONCE DAILY. E11.9 100 each 2  . OneTouch Delica Lancets 32I MISC USE TO CHECK BLOOD SUGAR ONCE DAILY 100 each 1  . tiZANidine (ZANAFLEX) 4 MG tablet Take 1 tablet (4 mg total) by mouth every 6 (six) hours as needed for muscle spasms. 60 tablet 1  . traMADol (ULTRAM) 50 MG tablet TAKE 1 TABELT BY MOUTH EVERY 6 HOURS AS NEEDED FOR PAIN 120 tablet 5   No current facility-administered medications on file prior to visit.     Allergies  Allergen Reactions  . Simvastatin Other (See Comments)    Muscle spasms    Family History  Problem Relation Age of Onset  . Liver disease Father   . Heart disease Mother   . Colon cancer Paternal Aunt   . Thyroid disease Sister        overactive  . Cancer Sister        lung, adrenal gland  . Thyroid disease Brother        overactive  . Colon cancer Other         aunt  . Cancer Other        Colon Cancer-Aunt  . Breast cancer Neg Hx   . Esophageal cancer  Neg Hx   . Rectal cancer Neg Hx   . Stomach cancer Neg Hx     BP 124/60 (BP Location: Left Arm, Patient Position: Sitting, Cuff Size: Normal)   Pulse 81   Ht '5\' 2"'  (1.575 m)   Wt 124 lb 6.4 oz (56.4 kg)   SpO2 92%   BMI 22.75 kg/m    Review of Systems Denies fever    Objective:   Physical Exam VITAL SIGNS:  See vs page GENERAL: no distress NECK: There is no palpable thyroid enlargement.  No thyroid nodule is palpable.  No palpable lymphadenopathy at the anterior neck.    Lab Results  Component Value Date   TSH 0.63 06/10/2019      Assessment & Plan:  Hyperthyroidism: well-controlled.  MNG: non-palpable.  All she needs to periodic PE of the neck.   Patient Instructions  Blood tests are requested for you today.  We'll let you know about the results.  If ever you have fever while taking methimazole, stop it and call us, even if the reason is obvious, because of the risk of a rare side-effect.   Please come back for a follow-up appointment in 6 months.

## 2019-06-11 ENCOUNTER — Other Ambulatory Visit: Payer: Self-pay | Admitting: Internal Medicine

## 2019-06-20 ENCOUNTER — Telehealth: Payer: Self-pay

## 2019-06-20 ENCOUNTER — Other Ambulatory Visit (INDEPENDENT_AMBULATORY_CARE_PROVIDER_SITE_OTHER): Payer: Medicare Other

## 2019-06-20 DIAGNOSIS — Z0000 Encounter for general adult medical examination without abnormal findings: Secondary | ICD-10-CM

## 2019-06-20 LAB — BASIC METABOLIC PANEL
BUN: 17 mg/dL (ref 6–23)
CO2: 28 mEq/L (ref 19–32)
Calcium: 9.1 mg/dL (ref 8.4–10.5)
Chloride: 105 mEq/L (ref 96–112)
Creatinine, Ser: 0.72 mg/dL (ref 0.40–1.20)
GFR: 95.86 mL/min (ref >60.00)
Glucose, Bld: 87 mg/dL (ref 70–99)
Potassium: 4.4 mEq/L (ref 3.5–5.1)
Sodium: 141 mEq/L (ref 135–145)

## 2019-06-20 LAB — URINALYSIS, ROUTINE W REFLEX MICROSCOPIC
Bilirubin Urine: NEGATIVE
Hgb urine dipstick: NEGATIVE
Ketones, ur: NEGATIVE
Leukocytes,Ua: NEGATIVE
Nitrite: NEGATIVE
RBC / HPF: NONE SEEN (ref 0–?)
Specific Gravity, Urine: 1.02 (ref 1.000–1.030)
Total Protein, Urine: NEGATIVE
Urine Glucose: NEGATIVE
Urobilinogen, UA: 0.2 (ref 0.0–1.0)
pH: 6.5 (ref 5.0–8.0)

## 2019-06-20 LAB — HEPATIC FUNCTION PANEL
ALT: 13 U/L (ref 0–35)
AST: 16 U/L (ref 0–37)
Albumin: 3.9 g/dL (ref 3.5–5.2)
Alkaline Phosphatase: 62 U/L (ref 39–117)
Bilirubin, Direct: 0.2 mg/dL (ref 0.0–0.3)
Total Bilirubin: 0.6 mg/dL (ref 0.2–1.2)
Total Protein: 6.2 g/dL (ref 6.0–8.3)

## 2019-06-20 LAB — CBC WITH DIFFERENTIAL/PLATELET
Basophils Absolute: 0 10*3/uL (ref 0.0–0.1)
Basophils Relative: 0.4 % (ref 0.0–3.0)
Eosinophils Absolute: 0.5 10*3/uL (ref 0.0–0.7)
Eosinophils Relative: 6.8 % — ABNORMAL HIGH (ref 0.0–5.0)
HCT: 38.2 % (ref 36.0–46.0)
Hemoglobin: 12.3 g/dL (ref 12.0–15.0)
Lymphocytes Relative: 29.4 % (ref 12.0–46.0)
Lymphs Abs: 2.1 10*3/uL (ref 0.7–4.0)
MCHC: 32.1 g/dL (ref 30.0–36.0)
MCV: 82 fl (ref 78.0–100.0)
Monocytes Absolute: 0.6 10*3/uL (ref 0.1–1.0)
Monocytes Relative: 8.1 % (ref 3.0–12.0)
Neutro Abs: 3.9 10*3/uL (ref 1.4–7.7)
Neutrophils Relative %: 55.3 % (ref 43.0–77.0)
Platelets: 218 10*3/uL (ref 150.0–400.0)
RBC: 4.66 Mil/uL (ref 3.87–5.11)
RDW: 16.5 % — ABNORMAL HIGH (ref 11.5–15.5)
WBC: 7 10*3/uL (ref 4.0–10.5)

## 2019-06-20 LAB — TSH: TSH: 1.74 u[IU]/mL (ref 0.35–4.50)

## 2019-06-20 LAB — LIPID PANEL
Cholesterol: 124 mg/dL (ref 0–200)
HDL: 57.6 mg/dL (ref >39.00)
LDL Cholesterol: 54 mg/dL (ref 0–99)
NonHDL: 66.54
Total CHOL/HDL Ratio: 2
Triglycerides: 62 mg/dL (ref 0.0–149.0)
VLDL: 12.4 mg/dL (ref 0.0–40.0)

## 2019-06-20 LAB — HEMOGLOBIN A1C: Hgb A1c MFr Bld: 6 % (ref 4.6–6.5)

## 2019-06-20 NOTE — Telephone Encounter (Signed)
Lab orders entered for upcoming OV

## 2019-06-25 ENCOUNTER — Telehealth: Payer: Self-pay | Admitting: Internal Medicine

## 2019-06-25 NOTE — Telephone Encounter (Signed)
Pt and her Husband Marijo File are scheduled for tomorrow at 9:40 and 10am, they would like to do their appointments over the phone please advise if ok

## 2019-06-26 ENCOUNTER — Other Ambulatory Visit: Payer: Self-pay

## 2019-06-26 ENCOUNTER — Encounter: Payer: Self-pay | Admitting: Internal Medicine

## 2019-06-26 ENCOUNTER — Ambulatory Visit (INDEPENDENT_AMBULATORY_CARE_PROVIDER_SITE_OTHER): Payer: Medicare Other | Admitting: Internal Medicine

## 2019-06-26 VITALS — BP 124/80 | HR 60 | Temp 98.3°F | Ht 62.0 in | Wt 123.0 lb

## 2019-06-26 DIAGNOSIS — E611 Iron deficiency: Secondary | ICD-10-CM | POA: Diagnosis not present

## 2019-06-26 DIAGNOSIS — E1140 Type 2 diabetes mellitus with diabetic neuropathy, unspecified: Secondary | ICD-10-CM

## 2019-06-26 DIAGNOSIS — M545 Low back pain, unspecified: Secondary | ICD-10-CM

## 2019-06-26 DIAGNOSIS — Z0000 Encounter for general adult medical examination without abnormal findings: Secondary | ICD-10-CM

## 2019-06-26 DIAGNOSIS — E559 Vitamin D deficiency, unspecified: Secondary | ICD-10-CM | POA: Diagnosis not present

## 2019-06-26 DIAGNOSIS — E538 Deficiency of other specified B group vitamins: Secondary | ICD-10-CM | POA: Diagnosis not present

## 2019-06-26 DIAGNOSIS — Z0001 Encounter for general adult medical examination with abnormal findings: Secondary | ICD-10-CM

## 2019-06-26 NOTE — Progress Notes (Signed)
Subjective:    Patient ID: [name] [name], female    DOB: 16-Jan-1946, 73 y.o.   MRN: [id number]  HPI  Here for wellness and f/u;  Overall doing ok;  Pt denies Chest pain, worsening SOB, DOE, wheezing, orthopnea, PND, worsening LE edema, palpitations, dizziness or syncope.  Pt denies neurological change such as new headache, facial or extremity weakness.  Pt denies polydipsia, polyuria, or low sugar symptoms. Pt states overall good compliance with treatment and medications, good tolerability, and has been trying to follow appropriate diet.  Pt denies worsening depressive symptoms, suicidal ideation or panic. No fever, night sweats, wt loss, loss of appetite, or other constitutional symptoms.  Pt states good ability with ADL's, has low fall risk, home safety reviewed and adequate, no other significant changes in hearing or vision, and only occasionally active with exercise.  Pt continues to have recurring LBP without change in severity, bowel or bladder change, fever, wt loss,  worsening LE pain/numbness/weakness, gait change or falls. Past Medical History:  Diagnosis Date  . ARTHRITIS 01/30/2008  . ASTHMA 09/03/2007  . Cancer ([place]) 1987   palate cancer  . COLON CANCER 04/07/2008   pt states she had colon cancer 1987  . COPD 09/03/2007  . COPD (chronic obstructive pulmonary disease) ([place]) 04/22/2013  . CORONARY ARTERY DISEASE 09/03/2007  . DEGENERATIVE JOINT DISEASE 01/30/2008  . DIABETES MELLITUS 01/30/2008  . Diabetic peripheral neuropathy ([place]) 04/22/2013  . Eczema   . GERD 09/03/2007  . GLUCOSE INTOLERANCE 09/03/2007  . GOITER, MULTINODULAR 05/21/2010  . History of blood transfusion   . HYPERCHOLESTEROLEMIA 09/03/2007  . HYPERTENSION 09/03/2007  . Impaired glucose tolerance 04/10/2011  . LVH (left ventricular hypertrophy)   . OBESITY 09/03/2007  . Osteopenia   . Psoriasis   . Shortness of breath dyspnea    With exertion and allergies to pollen   Past Surgical History:  Procedure Laterality  Date  . ABDOMINAL HYSTERECTOMY    . CARDIAC CATHETERIZATION    . COLON SURGERY    . FLOOR OF MOUTH BIOPSY N/A 12/24/2015   Procedure: ORAL BIOPSY;  Surgeon: Izora Gala, MD;  Location: [facility];  Service: ENT;  Laterality: N/A;  . HEMICOLECTOMY  12/1985   cancer  . HERNIA REPAIR    . MAXILLECTOMY Right 01/22/2016   Procedure: RIGHT SIDE PARTIAL MAXILLECTOMY;  Surgeon: Izora Gala, MD;  Location: [facility];  Service: ENT;  Laterality: Right;  . TUBAL LIGATION      reports that she quit smoking about 40 years ago. Her smoking use included cigarettes. She has a 15.00 pack-year smoking history. She has never used smokeless tobacco. She reports that she does not drink alcohol or use drugs. family history includes Cancer in her sister and another family member; Colon cancer in her paternal aunt and another family member; Heart disease in her mother; Liver disease in her father; Thyroid disease in her brother and sister. Allergies  Allergen Reactions  . Simvastatin Other (See Comments)    Muscle spasms   Current Outpatient Medications on File Prior to Visit  Medication Sig Dispense Refill  . acyclovir cream (ZOVIRAX) 5 % Apply 1 application topically daily as needed. For psoriasis    . albuterol (PROVENTIL HFA;VENTOLIN HFA) 108 (90 Base) MCG/ACT inhaler Inhale into the lungs.    Marland Kitchen amLODipine (NORVASC) 5 MG tablet TAKE 1 TABLET BY MOUTH EVERY DAY 90 tablet 1  . aspirin EC 81 MG tablet Take 1 tablet (81 mg total) by mouth daily. [place]  tablet 11  . Blood Glucose Monitoring Suppl (ONE TOUCH ULTRA SYSTEM KIT) W/DEVICE KIT 1 kit by Does not apply route once. 250.02 1 each 0  . budesonide-formoterol (SYMBICORT) 80-4.5 MCG/ACT inhaler Inhale 2 puffs into the lungs 2 (two) times daily. 1 Inhaler 4  . clobetasol (TEMOVATE) 0.05 % ointment Use twice once daily to hand and feet as needed (Patient taking differently: Apply 1 application topically 2 (two) times daily. Use twice once daily to hand and feet as needed) 30 g  1  . hydrochlorothiazide (HYDRODIURIL) 25 MG tablet Take 1 tablet (25 mg total) by mouth daily. 90 tablet 3  . loratadine-pseudoephedrine (CLARITIN-D 24-HOUR) 10-240 MG per 24 hr tablet Take 1 tablet by mouth daily.    Marland Kitchen lovastatin (MEVACOR) 40 MG tablet TAKE 2 TABLETS BY MOUTH EVERY DAY 180 tablet 0  . metFORMIN (GLUCOPHAGE-XR) 500 MG 24 hr tablet TAKE 2 TABLETS BY MOUTH EVERY DAY 180 tablet 2  . methimazole (TAPAZOLE) 5 MG tablet Take 1 tablet (5 mg total) by mouth daily. 90 tablet 1  . metoprolol tartrate (LOPRESSOR) 25 MG tablet TAKE 1 & 1/2 TABLETS TWICE A DAY 270 tablet 1  . naproxen (NAPROSYN) 500 MG tablet TAKE 1 TABLET (500 MG TOTAL) BY MOUTH 2 (TWO) TIMES DAILY WITH A MEAL. 180 tablet 0  . omeprazole (PRILOSEC) 20 MG capsule TAKE 1 CAPSULE BY MOUTH EVERY DAY 90 capsule 1  . ONE TOUCH ULTRA TEST test strip TEST ONCE DAILY. E11.9 100 each 2  . OneTouch Delica Lancets 68E MISC USE TO CHECK BLOOD SUGAR ONCE DAILY 100 each 1  . tiZANidine (ZANAFLEX) 4 MG tablet Take 1 tablet (4 mg total) by mouth every 6 (six) hours as needed for muscle spasms. 60 tablet 1  . traMADol (ULTRAM) 50 MG tablet TAKE 1 TABELT BY MOUTH EVERY 6 HOURS AS NEEDED FOR PAIN 120 tablet 5   No current facility-administered medications on file prior to visit.    Review of Systems Constitutional: Negative for other unusual diaphoresis, sweats, appetite or weight changes HENT: Negative for other worsening hearing loss, ear pain, facial swelling, mouth sores or neck stiffness.   Eyes: Negative for other worsening pain, redness or other visual disturbance.  Respiratory: Negative for other stridor or swelling Cardiovascular: Negative for other palpitations or other chest pain  Gastrointestinal: Negative for worsening diarrhea or loose stools, blood in stool, distention or other pain Genitourinary: Negative for hematuria, flank pain or other change in urine volume.  Musculoskeletal: Negative for myalgias or other joint  swelling.  Skin: Negative for other color change, or other wound or worsening drainage.  Neurological: Negative for other syncope or numbness. Hematological: Negative for other adenopathy or swelling Psychiatric/Behavioral: Negative for hallucinations, other worsening agitation, SI, self-injury, or new decreased concentration All other system neg per pt    Objective:   Physical Exam BP 124/80   Pulse 60   Temp 98.3 F (36.8 C)   Ht '5\' 2"'  (1.575 m)   Wt 123 lb (55.8 kg)   SpO2 99%   BMI 22.50 kg/m  VS noted,  Constitutional: Pt is oriented to person, place, and time. Appears well-developed and well-nourished, in no significant distress and comfortable Head: Normocephalic and atraumatic  Eyes: Conjunctivae and EOM are normal. Pupils are equal, round, and reactive to light Right Ear: External ear normal without discharge Left Ear: External ear normal without discharge Nose: Nose without discharge or deformity Mouth/Throat: Oropharynx is without other ulcerations and moist  Neck: Normal  range of motion. Neck supple. No JVD present. No tracheal deviation present or significant neck LA or mass Cardiovascular: Normal rate, regular rhythm, normal heart sounds and intact distal pulses.   Pulmonary/Chest: WOB normal and breath sounds without rales or wheezing  Abdominal: Soft. Bowel sounds are normal. NT. No HSM  Musculoskeletal: Normal range of motion. Exhibits no edema Lymphadenopathy: Has no other cervical adenopathy.  Neurological: Pt is alert and oriented to person, place, and time. Pt has normal reflexes. No cranial nerve deficit. Motor grossly intact, Gait intact Skin: Skin is warm and dry. No rash noted or new ulcerations Psychiatric:  Has normal mood and affect. Behavior is normal without agitation No other exam findings Lab Results  Component Value Date   WBC 7.0 06/20/2019   HGB 12.3 06/20/2019   HCT 38.2 06/20/2019   PLT 218.0 06/20/2019   GLUCOSE 87 06/20/2019   CHOL 124  06/20/2019   TRIG 62.0 06/20/2019   HDL 57.60 06/20/2019   LDLCALC 54 06/20/2019   ALT 13 06/20/2019   AST 16 06/20/2019   NA 141 06/20/2019   K 4.4 06/20/2019   CL 105 06/20/2019   CREATININE 0.72 06/20/2019   BUN 17 06/20/2019   CO2 28 06/20/2019   TSH 1.74 06/20/2019   INR 1.04 02/08/2016   HGBA1C 6.0 06/20/2019   MICROALBUR 1.4 11/16/2018       Assessment & Plan:

## 2019-06-26 NOTE — Patient Instructions (Signed)
Please continue all other medications as before, and refills have been done if requested.  Please have the pharmacy call with any other refills you may need.  Please continue your efforts at being more active, low cholesterol diet, and weight control.  You are otherwise up to date with prevention measures today.  Please keep your appointments with your specialists as you may have planned  Please call if you change your mind about the MRI for the lower back, and Dr Tamala Julian (sports medicine)  Please return in 6 months, or sooner if needed, with Lab testing done 3-5 days before

## 2019-06-27 ENCOUNTER — Telehealth: Payer: Self-pay | Admitting: Internal Medicine

## 2019-06-27 NOTE — Telephone Encounter (Signed)
Patient declined AWV. SF °

## 2019-06-30 ENCOUNTER — Encounter: Payer: Self-pay | Admitting: Internal Medicine

## 2019-06-30 NOTE — Assessment & Plan Note (Signed)
stable overall by history and exam, recent data reviewed with pt, and pt to continue medical treatment as before,  to f/u any worsening symptoms or concerns  

## 2019-06-30 NOTE — Assessment & Plan Note (Signed)

## 2019-06-30 NOTE — Assessment & Plan Note (Signed)
Consider MRI and /or sport med referral if persists or worsens

## 2019-07-05 ENCOUNTER — Ambulatory Visit
Admission: RE | Admit: 2019-07-05 | Discharge: 2019-07-05 | Disposition: A | Discharge status: Home / Self Care | Payer: Medicare Other | Source: Ambulatory Visit | Attending: Internal Medicine | Admitting: Internal Medicine

## 2019-07-05 ENCOUNTER — Other Ambulatory Visit: Payer: Self-pay

## 2019-07-05 DIAGNOSIS — Z1231 Encounter for screening mammogram for malignant neoplasm of breast: Secondary | ICD-10-CM

## 2019-07-17 ENCOUNTER — Other Ambulatory Visit: Payer: Self-pay | Admitting: Internal Medicine

## 2019-07-17 NOTE — Telephone Encounter (Signed)
Done erx 

## 2019-08-06 DIAGNOSIS — Z9622 Myringotomy tube(s) status: Secondary | ICD-10-CM | POA: Diagnosis not present

## 2019-08-06 DIAGNOSIS — H6981 Other specified disorders of Eustachian tube, right ear: Secondary | ICD-10-CM | POA: Diagnosis not present

## 2019-08-06 DIAGNOSIS — H6123 Impacted cerumen, bilateral: Secondary | ICD-10-CM | POA: Diagnosis not present

## 2019-08-06 DIAGNOSIS — Z85818 Personal history of malignant neoplasm of other sites of lip, oral cavity, and pharynx: Secondary | ICD-10-CM | POA: Diagnosis not present

## 2019-09-03 ENCOUNTER — Other Ambulatory Visit: Payer: Self-pay | Admitting: Internal Medicine

## 2019-09-09 ENCOUNTER — Other Ambulatory Visit: Payer: Self-pay

## 2019-09-09 NOTE — Patient Outreach (Signed)
[facility]) Care Management  09/09/2019  [name] [name] 09/09/1946 [id number]   Medication Adherence call to Mrs. [name] [name] HIPPA Compliant Voice message left with a call back number. [name] [name] is showing past due on Metformin Er 500 mg under [facility].   [facility] Management Direct Dial [phone]  Fax [phone] [email or web address]

## 2019-09-16 ENCOUNTER — Other Ambulatory Visit: Payer: Self-pay | Admitting: Internal Medicine

## 2019-09-17 ENCOUNTER — Other Ambulatory Visit: Payer: Self-pay | Admitting: Internal Medicine

## 2019-10-08 ENCOUNTER — Other Ambulatory Visit: Payer: Self-pay | Admitting: Internal Medicine

## 2019-10-15 ENCOUNTER — Other Ambulatory Visit: Payer: Self-pay | Admitting: Internal Medicine

## 2019-10-22 ENCOUNTER — Other Ambulatory Visit: Payer: Self-pay | Admitting: Internal Medicine

## 2019-12-09 ENCOUNTER — Telehealth: Payer: Self-pay

## 2019-12-09 NOTE — Telephone Encounter (Signed)
Please advise 

## 2019-12-09 NOTE — Telephone Encounter (Signed)
error 

## 2019-12-09 NOTE — Telephone Encounter (Signed)
Copied from [facility] [id number]. Topic: General - Other >> Dec 09, 2019 10:52 AM Rainey Pines A wrote: Patient wants to know if Dr. Jenny Reichmann can see both her and her husband at the same time on 12/17/2019. Patient appt is at 10:20 and spouse appt is at 10:40. Please advise

## 2019-12-09 NOTE — Telephone Encounter (Signed)
I do not have much to do with scheduling, anna to see if this can be accomodated with back to back appts

## 2019-12-10 NOTE — Telephone Encounter (Signed)
Notified pt ok to go back with the husband since have same date appointment 12/16/2018.

## 2019-12-10 NOTE — Telephone Encounter (Signed)
Correction: both pt have an appointment 12/17/2019

## 2019-12-12 ENCOUNTER — Other Ambulatory Visit (INDEPENDENT_AMBULATORY_CARE_PROVIDER_SITE_OTHER): Payer: Medicare Other

## 2019-12-12 DIAGNOSIS — Z0001 Encounter for general adult medical examination with abnormal findings: Secondary | ICD-10-CM | POA: Diagnosis not present

## 2019-12-12 DIAGNOSIS — E538 Deficiency of other specified B group vitamins: Secondary | ICD-10-CM | POA: Diagnosis not present

## 2019-12-12 DIAGNOSIS — E1140 Type 2 diabetes mellitus with diabetic neuropathy, unspecified: Secondary | ICD-10-CM

## 2019-12-12 DIAGNOSIS — E559 Vitamin D deficiency, unspecified: Secondary | ICD-10-CM | POA: Diagnosis not present

## 2019-12-12 DIAGNOSIS — E611 Iron deficiency: Secondary | ICD-10-CM

## 2019-12-12 LAB — LIPID PANEL
Cholesterol: 166 mg/dL (ref 0–200)
HDL: 63.2 mg/dL (ref >39.00)
LDL Cholesterol: 86 mg/dL (ref 0–99)
NonHDL: 102.41
Total CHOL/HDL Ratio: 3
Triglycerides: 80 mg/dL (ref 0.0–149.0)
VLDL: 16 mg/dL (ref 0.0–40.0)

## 2019-12-12 LAB — HEPATIC FUNCTION PANEL
ALT: 25 U/L (ref 0–35)
AST: 29 U/L (ref 0–37)
Albumin: 4.2 g/dL (ref 3.5–5.2)
Alkaline Phosphatase: 67 U/L (ref 39–117)
Bilirubin, Direct: 0.1 mg/dL (ref 0.0–0.3)
Total Bilirubin: 0.6 mg/dL (ref 0.2–1.2)
Total Protein: 6.8 g/dL (ref 6.0–8.3)

## 2019-12-12 LAB — CBC WITH DIFFERENTIAL/PLATELET
Basophils Absolute: 0.1 10*3/uL (ref 0.0–0.1)
Basophils Relative: 0.7 % (ref 0.0–3.0)
Eosinophils Absolute: 0.3 10*3/uL (ref 0.0–0.7)
Eosinophils Relative: 3.4 % (ref 0.0–5.0)
HCT: 39.3 % (ref 36.0–46.0)
Hemoglobin: 12.8 g/dL (ref 12.0–15.0)
Lymphocytes Relative: 22.3 % (ref 12.0–46.0)
Lymphs Abs: 2 10*3/uL (ref 0.7–4.0)
MCHC: 32.5 g/dL (ref 30.0–36.0)
MCV: 80.9 fl (ref 78.0–100.0)
Monocytes Absolute: 0.4 10*3/uL (ref 0.1–1.0)
Monocytes Relative: 4.9 % (ref 3.0–12.0)
Neutro Abs: 6.1 10*3/uL (ref 1.4–7.7)
Neutrophils Relative %: 68.7 % (ref 43.0–77.0)
Platelets: 227 10*3/uL (ref 150.0–400.0)
RBC: 4.86 Mil/uL (ref 3.87–5.11)
RDW: 17 % — ABNORMAL HIGH (ref 11.5–15.5)
WBC: 8.9 10*3/uL (ref 4.0–10.5)

## 2019-12-12 LAB — HEMOGLOBIN A1C: Hgb A1c MFr Bld: 6.1 % (ref 4.6–6.5)

## 2019-12-12 LAB — URINALYSIS, ROUTINE W REFLEX MICROSCOPIC
Bilirubin Urine: NEGATIVE
Hgb urine dipstick: NEGATIVE
Ketones, ur: NEGATIVE
Leukocytes,Ua: NEGATIVE
Nitrite: NEGATIVE
Specific Gravity, Urine: 1.01 (ref 1.000–1.030)
Total Protein, Urine: NEGATIVE
Urine Glucose: NEGATIVE
Urobilinogen, UA: 0.2 (ref 0.0–1.0)
pH: 6 (ref 5.0–8.0)

## 2019-12-12 LAB — BASIC METABOLIC PANEL
BUN: 14 mg/dL (ref 6–23)
CO2: 31 mEq/L (ref 19–32)
Calcium: 9.6 mg/dL (ref 8.4–10.5)
Chloride: 101 mEq/L (ref 96–112)
Creatinine, Ser: 0.76 mg/dL (ref 0.40–1.20)
GFR: 89.95 mL/min (ref >60.00)
Glucose, Bld: 105 mg/dL — ABNORMAL HIGH (ref 70–99)
Potassium: 4.1 mEq/L (ref 3.5–5.1)
Sodium: 139 mEq/L (ref 135–145)

## 2019-12-12 LAB — MICROALBUMIN / CREATININE URINE RATIO
Creatinine,U: 39.1 mg/dL
Microalb Creat Ratio: 1.9 mg/g (ref 0.0–30.0)
Microalb, Ur: 0.7 mg/dL (ref 0.0–1.9)

## 2019-12-12 LAB — IBC PANEL
Iron: 52 ug/dL (ref 42–145)
Saturation Ratios: 10.6 % — ABNORMAL LOW (ref 20.0–50.0)
Transferrin: 350 mg/dL (ref 212.0–360.0)

## 2019-12-12 LAB — TSH: TSH: 3.17 u[IU]/mL (ref 0.35–4.50)

## 2019-12-12 LAB — VITAMIN B12: Vitamin B-12: 186 pg/mL — ABNORMAL LOW (ref 211–911)

## 2019-12-12 LAB — VITAMIN D 25 HYDROXY (VIT D DEFICIENCY, FRACTURES): VITD: 25.67 ng/mL — ABNORMAL LOW (ref 30.00–100.00)

## 2019-12-14 ENCOUNTER — Other Ambulatory Visit: Payer: Self-pay | Admitting: Internal Medicine

## 2019-12-14 NOTE — Telephone Encounter (Signed)
Please refill as per office routine med refill policy (all routine meds refilled for 3 mo or monthly per pt preference up to one year from last visit, then month to month grace period for 3 mo, then further med refills will have to be denied)  

## 2019-12-16 ENCOUNTER — Ambulatory Visit: Payer: Medicare Other | Admitting: Endocrinology

## 2019-12-17 ENCOUNTER — Other Ambulatory Visit: Payer: Self-pay

## 2019-12-17 ENCOUNTER — Encounter: Payer: Self-pay | Admitting: Internal Medicine

## 2019-12-17 ENCOUNTER — Ambulatory Visit (INDEPENDENT_AMBULATORY_CARE_PROVIDER_SITE_OTHER): Payer: Medicare Other | Admitting: Internal Medicine

## 2019-12-17 VITALS — BP 120/60 | HR 72 | Temp 98.7°F | Resp 12 | Ht 63.0 in | Wt 115.0 lb

## 2019-12-17 DIAGNOSIS — E559 Vitamin D deficiency, unspecified: Secondary | ICD-10-CM | POA: Diagnosis not present

## 2019-12-17 DIAGNOSIS — E538 Deficiency of other specified B group vitamins: Secondary | ICD-10-CM | POA: Diagnosis not present

## 2019-12-17 DIAGNOSIS — I10 Essential (primary) hypertension: Secondary | ICD-10-CM

## 2019-12-17 DIAGNOSIS — E1140 Type 2 diabetes mellitus with diabetic neuropathy, unspecified: Secondary | ICD-10-CM

## 2019-12-17 MED ORDER — VITAMIN D (ERGOCALCIFEROL) 1.25 MG (50000 UNIT) PO CAPS: 50000.0000 [IU] | ORAL_CAPSULE | ORAL | 0 refills | Status: DC

## 2019-12-17 MED ORDER — VITAMIN B-12 1000 MCG PO TABS: 1000.0000 ug | ORAL_TABLET | Freq: Every day | ORAL | 3 refills | Status: DC

## 2019-12-17 NOTE — Patient Instructions (Signed)
Please take all new medication as prescribed - the vitamin B12  Please take Vitamin D 50000 units weekly for 12 weeks, then plan to change to OTC Vitamin D3 at 2000 units per day, indefinitely.  Please continue all other medications as before, and refills have been done if requested.  Please have the pharmacy call with any other refills you may need.  Please continue your efforts at being more active, low cholesterol diet, and weight control.  You are otherwise up to date with prevention measures today.  Please keep your appointments with your specialists as you may have planned  Please return in 6 months, or sooner if needed, with Lab testing done 3-5 days before at the [facility] - please make appt as you leave today for this

## 2019-12-17 NOTE — Progress Notes (Signed)
 Subjective:    Patient ID: [name] [name], female    DOB: 09/30/1946, 73 y.o.   MRN: [id number]  HPI Here to f/u; overall doing ok,  Pt denies chest pain, increasing sob or doe, wheezing, orthopnea, PND, increased LE swelling, palpitations, dizziness or syncope.  Pt denies new neurological symptoms such as new headache, or facial or extremity weakness or numbness.  Pt denies polydipsia, polyuria, or low sugar episode.  Pt states overall good compliance with meds, mostly trying to follow appropriate diet, with wt overall stable,  but little exercise however. Alsl has low vit d and b12 per lab eval Past Medical History:  Diagnosis Date  . ARTHRITIS 01/30/2008  . ASTHMA 09/03/2007  . Cancer (HCC) 1987   palate cancer  . COLON CANCER 04/07/2008   pt states she had colon cancer 1987  . COPD 09/03/2007  . COPD (chronic obstructive pulmonary disease) (HCC) 04/22/2013  . CORONARY ARTERY DISEASE 09/03/2007  . DEGENERATIVE JOINT DISEASE 01/30/2008  . DIABETES MELLITUS 01/30/2008  . Diabetic peripheral neuropathy (HCC) 04/22/2013  . Eczema   . GERD 09/03/2007  . GLUCOSE INTOLERANCE 09/03/2007  . GOITER, MULTINODULAR 05/21/2010  . History of blood transfusion   . HYPERCHOLESTEROLEMIA 09/03/2007  . HYPERTENSION 09/03/2007  . Impaired glucose tolerance 04/10/2011  . LVH (left ventricular hypertrophy)   . OBESITY 09/03/2007  . Osteopenia   . Psoriasis   . Shortness of breath dyspnea    With exertion and allergies to pollen   Past Surgical History:  Procedure Laterality Date  . ABDOMINAL HYSTERECTOMY    . CARDIAC CATHETERIZATION    . COLON SURGERY    . FLOOR OF MOUTH BIOPSY N/A 12/24/2015   Procedure: ORAL BIOPSY;  Surgeon: Jefry Rosen, MD;  Location: [facility] OR;  Service: ENT;  Laterality: N/A;  . HEMICOLECTOMY  12/1985   cancer  . HERNIA REPAIR    . MAXILLECTOMY Right 01/22/2016   Procedure: RIGHT SIDE PARTIAL MAXILLECTOMY;  Surgeon: Jefry Rosen, MD;  Location: [facility] OR;  Service: ENT;  Laterality: Right;  .  TUBAL LIGATION      reports that she quit smoking about 41 years ago. Her smoking use included cigarettes. She has a 15.00 pack-year smoking history. She has never used smokeless tobacco. She reports that she does not drink alcohol or use drugs. family history includes Cancer in her sister and another family member; Colon cancer in her paternal aunt and another family member; Heart disease in her mother; Liver disease in her father; Thyroid disease in her brother and sister. Allergies  Allergen Reactions  . Simvastatin Other (See Comments)    Muscle spasms   Current Outpatient Medications on File Prior to Visit  Medication Sig Dispense Refill  . acyclovir cream (ZOVIRAX) 5 % Apply 1 application topically daily as needed. For psoriasis    . albuterol (PROVENTIL HFA;VENTOLIN HFA) 108 (90 Base) MCG/ACT inhaler Inhale into the lungs.    . amLODipine (NORVASC) 5 MG tablet TAKE 1 TABLET BY MOUTH EVERY DAY 90 tablet 1  . aspirin EC 81 MG tablet Take 1 tablet (81 mg total) by mouth daily. 90 tablet 11  . Blood Glucose Monitoring Suppl (ONE TOUCH ULTRA SYSTEM KIT) W/DEVICE KIT 1 kit by Does not apply route once. 250.02 1 each 0  . budesonide-formoterol (SYMBICORT) 80-4.5 MCG/ACT inhaler Inhale 2 puffs into the lungs 2 (two) times daily. 1 Inhaler 4  . clobetasol (TEMOVATE) 0.05 % ointment Use twice once daily to hand and feet as needed (  Patient taking differently: Apply 1 application topically 2 (two) times daily. Use twice once daily to hand and feet as needed) 30 g 1  . hydrochlorothiazide (HYDRODIURIL) 25 MG tablet TAKE 1 TABLET BY MOUTH EVERY DAY 90 tablet 1  . loratadine-pseudoephedrine (CLARITIN-D 24-HOUR) 10-240 MG per 24 hr tablet Take 1 tablet by mouth daily.    Marland Kitchen lovastatin (MEVACOR) 40 MG tablet TAKE 2 TABLETS BY MOUTH EVERY DAY 180 tablet 1  . metFORMIN (GLUCOPHAGE-XR) 500 MG 24 hr tablet TAKE 2 TABLETS BY MOUTH EVERY DAY 180 tablet 2  . methimazole (TAPAZOLE) 5 MG tablet Take 1 tablet (5 mg  total) by mouth daily. 90 tablet 1  . metoprolol tartrate (LOPRESSOR) 25 MG tablet TAKE 1 & 1/2 TABLETS TWICE A DAY 270 tablet 1  . naproxen (NAPROSYN) 500 MG tablet TAKE 1 TABLET (500 MG TOTAL) BY MOUTH 2 (TWO) TIMES DAILY WITH A MEAL. 180 tablet 0  . omeprazole (PRILOSEC) 20 MG capsule TAKE 1 CAPSULE BY MOUTH EVERY DAY 90 capsule 1  . OneTouch Delica Lancets 67E MISC USE TO CHECK BLOOD SUGAR ONCE DAILY 100 each 1  . ONETOUCH ULTRA test strip TEST ONCE DAILY. E11.9 100 strip 2  . tiZANidine (ZANAFLEX) 4 MG tablet TAKE 1 TABLET (4 MG TOTAL) BY MOUTH EVERY 6 (SIX) HOURS AS NEEDED FOR MUSCLE SPASMS. 60 tablet 1  . traMADol (ULTRAM) 50 MG tablet TAKE 1 TABELT BY MOUTH EVERY 6 HOURS AS NEEDED FOR PAIN 120 tablet 5   No current facility-administered medications on file prior to visit.   Review of Systems  Constitutional: Negative for other unusual diaphoresis or sweats HENT: Negative for ear discharge or swelling Eyes: Negative for other worsening visual disturbances Respiratory: Negative for stridor or other swelling  Gastrointestinal: Negative for worsening distension or other blood Genitourinary: Negative for retention or other urinary change Musculoskeletal: Negative for other MSK pain or swelling Skin: Negative for color change or other new lesions Neurological: Negative for worsening tremors and other numbness  Psychiatric/Behavioral: Negative for worsening agitation or other fatigue All otherwise neg per pt     Objective:   Physical Exam BP 120/60   Pulse 72   Temp 98.7 F (37.1 C)   Resp 12   Ht 5' 3" (1.6 m)   Wt 115 lb (52.2 kg)   SpO2 97%   BMI 20.37 kg/m  VS noted,  Constitutional: Pt appears in NAD HENT: Head: NCAT.  Right Ear: External ear normal.  Left Ear: External ear normal.  Eyes: . Pupils are equal, round, and reactive to light. Conjunctivae and EOM are normal Nose: without d/c or deformity Neck: Neck supple. Gross normal ROM Cardiovascular: Normal rate and  regular rhythm.   Pulmonary/Chest: Effort normal and breath sounds without rales or wheezing.  Abd:  Soft, NT, ND, + BS, no organomegaly Neurological: Pt is alert. At baseline orientation, motor grossly intact Skin: Skin is warm. No rashes, other new lesions, no LE edema Psychiatric: Pt behavior is normal without agitation  All otherwise neg per pt Lab Results  Component Value Date   WBC 8.9 12/12/2019   HGB 12.8 12/12/2019   HCT 39.3 12/12/2019   PLT 227.0 12/12/2019   GLUCOSE 105 (H) 12/12/2019   CHOL 166 12/12/2019   TRIG 80.0 12/12/2019   HDL 63.20 12/12/2019   LDLCALC 86 12/12/2019   ALT 25 12/12/2019   AST 29 12/12/2019   NA 139 12/12/2019   K 4.1 12/12/2019   CL 101 12/12/2019  CREATININE 0.76 12/12/2019   BUN 14 12/12/2019   CO2 31 12/12/2019   TSH 3.17 12/12/2019   INR 1.04 02/08/2016   HGBA1C 6.1 12/12/2019   MICROALBUR 0.7 12/12/2019      Assessment & Plan:

## 2019-12-22 ENCOUNTER — Encounter: Payer: Self-pay | Admitting: Internal Medicine

## 2019-12-22 NOTE — Assessment & Plan Note (Signed)
For oral replacement 

## 2019-12-22 NOTE — Assessment & Plan Note (Signed)
stable overall by history and exam, recent data reviewed with pt, and pt to continue medical treatment as before,  to f/u any worsening symptoms or concerns  

## 2019-12-23 ENCOUNTER — Ambulatory Visit: Payer: Medicare Other | Admitting: Adult Health

## 2019-12-23 ENCOUNTER — Ambulatory Visit (INDEPENDENT_AMBULATORY_CARE_PROVIDER_SITE_OTHER): Payer: Medicare Other | Admitting: Adult Health

## 2019-12-23 ENCOUNTER — Other Ambulatory Visit: Payer: Self-pay

## 2019-12-23 ENCOUNTER — Encounter: Payer: Self-pay | Admitting: Adult Health

## 2019-12-23 DIAGNOSIS — J309 Allergic rhinitis, unspecified: Secondary | ICD-10-CM

## 2019-12-23 DIAGNOSIS — J45909 Unspecified asthma, uncomplicated: Secondary | ICD-10-CM

## 2019-12-23 NOTE — Patient Instructions (Signed)
Continue on Symbicort 2 puffs Twice daily . Rinse well after use.  Claritin 10mg  daily .  May use Albuterol inhaler As needed   Please contact office for sooner follow up if symptoms do not improve or worsen or seek emergency care  Follow up with Dr. Elsworth Soho or Alieu Finnigan NP in 4 months and As needed

## 2019-12-23 NOTE — Progress Notes (Signed)
_0  ID: [name] [name], female    DOB: Aug 16, 1946, 74 y.o.   MRN: [id number]    Referring provider: Biagio Borg, MD  HPI: 74 year old female former smoker followed for asthmatic bronchitis complicated by GERD and suspected component of vocal cord dysfunction Lung nodules on CT chest without significant change on serial CTs History is significant for oral cancer diagnosed in 2016  TEST/EVENTS :  2014 Spirometry >>FEv1 84% CT chest 01/2016 3 mm RLL nodules CT chest 02/21/2017 3 subpleural right lower lobe pulmonary nodules measuring 3 mm unchanged since 2017 consistent with benign process  12/23/2019 follow-up asthma Patient presents for a follow-up visit last seen in February 2020.  Patient says her asthma has been under good control.  She remains on Symbicort twice daily.  Uses Claritin daily.  She denies any flare of cough or wheezing.  Denies any significant shortness of breath. She has been under a lot of stress this year with the Covid pandemic.  Her husband was critically ill with Covid and she is his primary caregiver.    Allergies  Allergen Reactions  . Simvastatin Other (See Comments)    Muscle spasms    Immunization History  Administered Date(s) Administered  . Fluad Quad(high Dose 65+) 07/15/2019  . H1N1 10/13/2008  . Influenza Split 07/31/2012  . Influenza Whole 09/12/2007, 09/12/2008, 08/20/2009, 08/26/2011  . Influenza, High Dose Seasonal PF 08/26/2014, 08/30/2016, 08/24/2017, 08/24/2018, 08/05/2019, 08/05/2019  . Influenza, Seasonal, Injecte, Preservative Fre 08/26/2013  . Influenza-Unspecified 08/27/2015  . Pneumococcal Conjugate-13 10/31/2014  . Pneumococcal Polysaccharide-23 12/21/2010  . Td 03/22/1995, 09/20/2007  . Tdap 11/22/2017    Past Medical History:  Diagnosis Date  . ARTHRITIS 01/30/2008  . ASTHMA 09/03/2007  . Cancer ([place]) 1987   palate cancer  . COLON CANCER 04/07/2008   pt states she had colon cancer 1987  . COPD 09/03/2007  . COPD  (chronic obstructive pulmonary disease) ([place]) 04/22/2013  . CORONARY ARTERY DISEASE 09/03/2007  . DEGENERATIVE JOINT DISEASE 01/30/2008  . DIABETES MELLITUS 01/30/2008  . Diabetic peripheral neuropathy ([place]) 04/22/2013  . Eczema   . GERD 09/03/2007  . GLUCOSE INTOLERANCE 09/03/2007  . GOITER, MULTINODULAR 05/21/2010  . History of blood transfusion   . HYPERCHOLESTEROLEMIA 09/03/2007  . HYPERTENSION 09/03/2007  . Impaired glucose tolerance 04/10/2011  . LVH (left ventricular hypertrophy)   . OBESITY 09/03/2007  . Osteopenia   . Psoriasis   . Shortness of breath dyspnea    With exertion and allergies to pollen    Tobacco History: Social History   Tobacco Use  Smoking Status Former Smoker  . Packs/day: 1.00  . Years: 15.00  . Pack years: 15.00  . Types: Cigarettes  . Quit date: 12/12/1978  . Years since quitting: 41.0  Smokeless Tobacco Never Used   Counseling given: Not Answered   Outpatient Medications Prior to Visit  Medication Sig Dispense Refill  . acyclovir cream (ZOVIRAX) 5 % Apply 1 application topically daily as needed. For psoriasis    . albuterol (PROVENTIL HFA;VENTOLIN HFA) 108 (90 Base) MCG/ACT inhaler Inhale into the lungs.    Marland Kitchen amLODipine (NORVASC) 5 MG tablet TAKE 1 TABLET BY MOUTH EVERY DAY 90 tablet 1  . aspirin EC 81 MG tablet Take 1 tablet (81 mg total) by mouth daily. 90 tablet 11  . Blood Glucose Monitoring Suppl (ONE TOUCH ULTRA SYSTEM KIT) W/DEVICE KIT 1 kit by Does not apply route once. 250.02 1 each 0  . budesonide-formoterol (SYMBICORT) 80-4.5 MCG/ACT inhaler Inhale 2  puffs into the lungs 2 (two) times daily. 1 Inhaler 4  . clobetasol (TEMOVATE) 0.05 % ointment Use twice once daily to hand and feet as needed (Patient taking differently: Apply 1 application topically 2 (two) times daily. Use twice once daily to hand and feet as needed) 30 g 1  . hydrochlorothiazide (HYDRODIURIL) 25 MG tablet TAKE 1 TABLET BY MOUTH EVERY DAY 90 tablet 1  .  loratadine-pseudoephedrine (CLARITIN-D 24-HOUR) 10-240 MG per 24 hr tablet Take 1 tablet by mouth daily.    Marland Kitchen lovastatin (MEVACOR) 40 MG tablet TAKE 2 TABLETS BY MOUTH EVERY DAY 180 tablet 1  . metFORMIN (GLUCOPHAGE-XR) 500 MG 24 hr tablet TAKE 2 TABLETS BY MOUTH EVERY DAY 180 tablet 2  . methimazole (TAPAZOLE) 5 MG tablet Take 1 tablet (5 mg total) by mouth daily. 90 tablet 1  . metoprolol tartrate (LOPRESSOR) 25 MG tablet TAKE 1 & 1/2 TABLETS TWICE A DAY 270 tablet 1  . naproxen (NAPROSYN) 500 MG tablet TAKE 1 TABLET (500 MG TOTAL) BY MOUTH 2 (TWO) TIMES DAILY WITH A MEAL. 180 tablet 0  . omeprazole (PRILOSEC) 20 MG capsule TAKE 1 CAPSULE BY MOUTH EVERY DAY 90 capsule 1  . OneTouch Delica Lancets 17O MISC USE TO CHECK BLOOD SUGAR ONCE DAILY 100 each 1  . ONETOUCH ULTRA test strip TEST ONCE DAILY. E11.9 100 strip 2  . tiZANidine (ZANAFLEX) 4 MG tablet TAKE 1 TABLET (4 MG TOTAL) BY MOUTH EVERY 6 (SIX) HOURS AS NEEDED FOR MUSCLE SPASMS. 60 tablet 1  . traMADol (ULTRAM) 50 MG tablet TAKE 1 TABELT BY MOUTH EVERY 6 HOURS AS NEEDED FOR PAIN 120 tablet 5  . vitamin B-12 (CYANOCOBALAMIN) 1000 MCG tablet Take 1 tablet (1,000 mcg total) by mouth daily. 90 tablet 3  . Vitamin D, Ergocalciferol, (DRISDOL) 1.25 MG (50000 UT) CAPS capsule Take 1 capsule (50,000 Units total) by mouth every 7 (seven) days. 12 capsule 0   No facility-administered medications prior to visit.     Review of Systems:   Constitutional:   No  weight loss, night sweats,  Fevers, chills, fatigue, or  lassitude.  HEENT:   No headaches,  Difficulty swallowing,  Tooth/dental problems, or  Sore throat,                No sneezing, itching, ear ache, nasal congestion, post nasal drip,   CV:  No chest pain,  Orthopnea, PND, swelling in lower extremities, anasarca, dizziness, palpitations, syncope.   GI  No heartburn, indigestion, abdominal pain, nausea, vomiting, diarrhea, change in bowel habits, loss of appetite, bloody stools.    Resp: No shortness of breath with exertion or at rest.  No excess mucus, no productive cough,  No non-productive cough,  No coughing up of blood.  No change in color of mucus.  No wheezing.  No chest wall deformity  Skin: no rash or lesions.  GU: no dysuria, change in color of urine, no urgency or frequency.  No flank pain, no hematuria   MS:  No joint pain or swelling.  No decreased range of motion.  No back pain.    Physical Exam    GEN: A/Ox3; pleasant , NAD, well nourished    HEENT:  /AT, NOSE-clear, THROAT-clear, no lesions, no postnasal drip or exudate noted.   NECK:  Supple w/ fair ROM; no JVD; normal carotid impulses w/o bruits; no thyromegaly or nodules palpated; no lymphadenopathy.    RESP  Clear  P & A; w/o, wheezes/ rales/ or  rhonchi. no accessory muscle use, no dullness to percussion  CARD:  RRR, no m/r/g, no peripheral edema, pulses intact, no cyanosis or clubbing.  GI:   Soft & nt; nml bowel sounds; no organomegaly or masses detected.   Musco: Warm bil, no deformities or joint swelling noted.   Neuro: alert, no focal deficits noted.    Skin: Warm, no lesions or rashes    Lab Results:  CBC  BMET  BNP No results found for: BNP  ProBNP No results found for: PROBNP  Imaging: No results found.    No flowsheet data found.  No results found for: NITRICOXIDE      Assessment & Plan:   No problem-specific Assessment & Plan notes found for this encounter.     Rexene Edison, NP 12/23/2019

## 2019-12-24 NOTE — Assessment & Plan Note (Signed)
Well-controlled no changes  Plan  Patient Instructions  Continue on Symbicort 2 puffs Twice daily . Rinse well after use.  Claritin 10mg  daily .  May use Albuterol inhaler As needed   Please contact office for sooner follow up if symptoms do not improve or worsen or seek emergency care  Follow up with Dr. Elsworth Soho or Ayodeji Keimig NP in 4 months and As needed

## 2019-12-24 NOTE — Assessment & Plan Note (Signed)
Well-controlled no changes  Plan  Patient Instructions  Continue on Symbicort 2 puffs Twice daily . Rinse well after use.  Claritin 10mg  daily .  May use Albuterol inhaler As needed   Please contact office for sooner follow up if symptoms do not improve or worsen or seek emergency care  Follow up with Dr. Elsworth Soho or Alie Moudy NP in 4 months and As needed

## 2020-01-21 ENCOUNTER — Other Ambulatory Visit: Payer: Self-pay | Admitting: Internal Medicine

## 2020-01-28 ENCOUNTER — Other Ambulatory Visit: Payer: Self-pay | Admitting: Endocrinology

## 2020-01-28 ENCOUNTER — Other Ambulatory Visit: Payer: Self-pay | Admitting: Internal Medicine

## 2020-01-28 NOTE — Telephone Encounter (Signed)
Done erx 

## 2020-03-02 ENCOUNTER — Other Ambulatory Visit: Payer: Self-pay | Admitting: Internal Medicine

## 2020-03-10 DIAGNOSIS — Z85819 Personal history of malignant neoplasm of unspecified site of lip, oral cavity, and pharynx: Secondary | ICD-10-CM | POA: Diagnosis not present

## 2020-03-11 ENCOUNTER — Other Ambulatory Visit: Payer: Self-pay | Admitting: Internal Medicine

## 2020-03-11 ENCOUNTER — Other Ambulatory Visit: Payer: Self-pay | Admitting: Otolaryngology

## 2020-03-11 DIAGNOSIS — Z85819 Personal history of malignant neoplasm of unspecified site of lip, oral cavity, and pharynx: Secondary | ICD-10-CM

## 2020-03-18 ENCOUNTER — Other Ambulatory Visit: Payer: Self-pay | Admitting: Otolaryngology

## 2020-03-18 DIAGNOSIS — Z85819 Personal history of malignant neoplasm of unspecified site of lip, oral cavity, and pharynx: Secondary | ICD-10-CM

## 2020-03-20 ENCOUNTER — Other Ambulatory Visit: Payer: Self-pay

## 2020-03-24 ENCOUNTER — Ambulatory Visit (INDEPENDENT_AMBULATORY_CARE_PROVIDER_SITE_OTHER): Payer: Medicare Other | Admitting: Endocrinology

## 2020-03-24 ENCOUNTER — Other Ambulatory Visit: Payer: Self-pay

## 2020-03-24 DIAGNOSIS — E0590 Thyrotoxicosis, unspecified without thyrotoxic crisis or storm: Secondary | ICD-10-CM

## 2020-03-24 NOTE — Progress Notes (Addendum)
   Subjective:    Patient ID: [name] [name], female    DOB: 03/25/1946, 73 y.o.   MRN: [id number]  HPI  telehealth visit today via phone x 7 minutes. Alternatives to telehealth are presented to this patient, and the patient agrees to the telehealth visit.  Pt is advised of the cost of the visit, and agrees to this, also.   Patient is at home, and I am at home.   Persons attending the telehealth visit: the patient and I.   Pt returns for f/u of a small multinodular goiter and hyperthyroidism (dx'ed 2011; nodules have not met criteria for bx; f/u ultrasound in 2014 was unchanged; in 2017, CT showed normal thyroid; in 2016, TSH was more suppressed; she declined RAI, so she was then rx'ed tapazole). she feels well in general.  She takes tapazole 5 mg  QD, as rx'ed.  Denies neck pain and swelling.     Review of Systems Denies fever    Objective:   Physical Exam   Lab Results  Component Value Date   TSH 3.17 12/12/2019      Assessment & Plan:  Hyperthyroidism: well-controlled.    Patient Instructions  Please continue the same medication for now. When you see Dr Jenny Reichmann in June, please make sure he checks the thyroid blood test.   If ever you have fever while taking methimazole, stop it and call us, even if the reason is obvious, because of the risk of a rare side-effect. It is best to never miss the medication.  However, if you do miss it, next best is to double up the next time.   Please come back for a follow-up appointment in 6 months.

## 2020-03-24 NOTE — Patient Instructions (Signed)
Please continue the same medication for now. When you see Dr Jenny Reichmann in June, please make sure he checks the thyroid blood test.   If ever you have fever while taking methimazole, stop it and call us, even if the reason is obvious, because of the risk of a rare side-effect. It is best to never miss the medication.  However, if you do miss it, next best is to double up the next time.   Please come back for a follow-up appointment in 6 months.

## 2020-03-25 ENCOUNTER — Other Ambulatory Visit: Payer: Self-pay

## 2020-03-25 ENCOUNTER — Ambulatory Visit
Admission: RE | Admit: 2020-03-25 | Discharge: 2020-03-25 | Disposition: A | Discharge status: Home / Self Care | Payer: Medicare Other | Source: Ambulatory Visit | Attending: Otolaryngology | Admitting: Otolaryngology

## 2020-03-25 DIAGNOSIS — D492 Neoplasm of unspecified behavior of bone, soft tissue, and skin: Secondary | ICD-10-CM | POA: Diagnosis not present

## 2020-03-25 DIAGNOSIS — Z85819 Personal history of malignant neoplasm of unspecified site of lip, oral cavity, and pharynx: Secondary | ICD-10-CM

## 2020-03-25 MED ORDER — IOPAMIDOL (ISOVUE-300) INJECTION 61%
75.0000 mL | Freq: Once | INTRAVENOUS | Status: AC | PRN
  Administered 2020-03-25: 75 mL via INTRAVENOUS

## 2020-04-09 ENCOUNTER — Other Ambulatory Visit (HOSPITAL_COMMUNITY): Payer: Self-pay | Admitting: Otolaryngology

## 2020-04-09 ENCOUNTER — Other Ambulatory Visit: Payer: Self-pay | Admitting: Otolaryngology

## 2020-04-09 DIAGNOSIS — C310 Malignant neoplasm of maxillary sinus: Secondary | ICD-10-CM

## 2020-04-17 ENCOUNTER — Other Ambulatory Visit: Payer: Self-pay | Admitting: Internal Medicine

## 2020-04-17 NOTE — Telephone Encounter (Signed)
Please refill as per office routine med refill policy (all routine meds refilled for 3 mo or monthly per pt preference up to one year from last visit, then month to month grace period for 3 mo, then further med refills will have to be denied)  

## 2020-04-21 ENCOUNTER — Encounter (HOSPITAL_COMMUNITY)
Admission: RE | Admit: 2020-04-21 | Discharge: 2020-04-21 | Disposition: A | Discharge status: Home / Self Care | Payer: Medicare Other | Source: Ambulatory Visit | Attending: Otolaryngology | Admitting: Otolaryngology

## 2020-04-21 ENCOUNTER — Other Ambulatory Visit: Payer: Self-pay

## 2020-04-21 DIAGNOSIS — I2510 Atherosclerotic heart disease of native coronary artery without angina pectoris: Secondary | ICD-10-CM | POA: Diagnosis not present

## 2020-04-21 DIAGNOSIS — M479 Spondylosis, unspecified: Secondary | ICD-10-CM | POA: Diagnosis not present

## 2020-04-21 DIAGNOSIS — I700 Atherosclerosis of aorta: Secondary | ICD-10-CM | POA: Diagnosis not present

## 2020-04-21 DIAGNOSIS — I517 Cardiomegaly: Secondary | ICD-10-CM | POA: Insufficient documentation

## 2020-04-21 DIAGNOSIS — C310 Malignant neoplasm of maxillary sinus: Secondary | ICD-10-CM

## 2020-04-21 LAB — GLUCOSE, CAPILLARY: Glucose-Capillary: 86 mg/dL (ref 70–99)

## 2020-04-21 MED ORDER — FLUDEOXYGLUCOSE F - 18 (FDG) INJECTION: 5.7500 | Freq: Once | INTRAVENOUS | Status: DC | PRN

## 2020-04-29 DIAGNOSIS — C310 Malignant neoplasm of maxillary sinus: Secondary | ICD-10-CM | POA: Insufficient documentation

## 2020-05-19 ENCOUNTER — Telehealth: Payer: Self-pay

## 2020-05-19 ENCOUNTER — Telehealth: Payer: Self-pay | Admitting: Internal Medicine

## 2020-05-19 DIAGNOSIS — I4510 Unspecified right bundle-branch block: Secondary | ICD-10-CM | POA: Diagnosis not present

## 2020-05-19 DIAGNOSIS — I10 Essential (primary) hypertension: Secondary | ICD-10-CM | POA: Diagnosis not present

## 2020-05-19 DIAGNOSIS — Z7982 Long term (current) use of aspirin: Secondary | ICD-10-CM | POA: Diagnosis not present

## 2020-05-19 DIAGNOSIS — R918 Other nonspecific abnormal finding of lung field: Secondary | ICD-10-CM | POA: Diagnosis not present

## 2020-05-19 DIAGNOSIS — Z85038 Personal history of other malignant neoplasm of large intestine: Secondary | ICD-10-CM | POA: Insufficient documentation

## 2020-05-19 DIAGNOSIS — J45909 Unspecified asthma, uncomplicated: Secondary | ICD-10-CM | POA: Diagnosis not present

## 2020-05-19 DIAGNOSIS — C310 Malignant neoplasm of maxillary sinus: Secondary | ICD-10-CM | POA: Diagnosis not present

## 2020-05-19 DIAGNOSIS — C7949 Secondary malignant neoplasm of other parts of nervous system: Secondary | ICD-10-CM | POA: Diagnosis not present

## 2020-05-19 DIAGNOSIS — Z794 Long term (current) use of insulin: Secondary | ICD-10-CM | POA: Diagnosis not present

## 2020-05-19 DIAGNOSIS — C7951 Secondary malignant neoplasm of bone: Secondary | ICD-10-CM | POA: Diagnosis not present

## 2020-05-19 DIAGNOSIS — Z833 Family history of diabetes mellitus: Secondary | ICD-10-CM | POA: Diagnosis not present

## 2020-05-19 DIAGNOSIS — E785 Hyperlipidemia, unspecified: Secondary | ICD-10-CM | POA: Diagnosis not present

## 2020-05-19 DIAGNOSIS — E119 Type 2 diabetes mellitus without complications: Secondary | ICD-10-CM | POA: Diagnosis not present

## 2020-05-19 DIAGNOSIS — K219 Gastro-esophageal reflux disease without esophagitis: Secondary | ICD-10-CM | POA: Diagnosis not present

## 2020-05-19 DIAGNOSIS — Z87891 Personal history of nicotine dependence: Secondary | ICD-10-CM | POA: Diagnosis not present

## 2020-05-19 DIAGNOSIS — E079 Disorder of thyroid, unspecified: Secondary | ICD-10-CM | POA: Diagnosis not present

## 2020-05-19 DIAGNOSIS — Z8349 Family history of other endocrine, nutritional and metabolic diseases: Secondary | ICD-10-CM | POA: Diagnosis not present

## 2020-05-19 DIAGNOSIS — C7989 Secondary malignant neoplasm of other specified sites: Secondary | ICD-10-CM | POA: Diagnosis not present

## 2020-05-19 DIAGNOSIS — I517 Cardiomegaly: Secondary | ICD-10-CM | POA: Diagnosis not present

## 2020-05-19 DIAGNOSIS — Z8509 Personal history of malignant neoplasm of other digestive organs: Secondary | ICD-10-CM | POA: Diagnosis not present

## 2020-05-19 NOTE — Telephone Encounter (Signed)
Patient called today to make [place] aware her cancer is back and she is having surgery 05/28/20 to remove it and she was unable to get her labs drawn this month but she will be having labs drawn at [facility] and she will have the results faxed to us-I gave her our fax number

## 2020-05-19 NOTE — Telephone Encounter (Signed)
New message:    Pt is calling and states she would like Dr. Jenny Reichmann to know she will be having surgery this month. She states she will be having surgery at [facility] to remove some cancer that has come back since 2017. She states Dr. Constance Holster is her ENT if he would like to follow what is going on with her cancer. Please advise.

## 2020-05-19 NOTE — Telephone Encounter (Signed)
1.  Don't worry about fax #--we can get results from care everywhere. 2. Please move up appt to when you feel better after the surgery.

## 2020-05-19 NOTE — Telephone Encounter (Signed)
Called pt and spoke with pt's husband. Gave him MD message, and he verbalized understanding of this. He did state that he would relay the message to the pt.

## 2020-05-20 DIAGNOSIS — R768 Other specified abnormal immunological findings in serum: Secondary | ICD-10-CM | POA: Insufficient documentation

## 2020-05-25 ENCOUNTER — Other Ambulatory Visit: Payer: Self-pay | Admitting: Internal Medicine

## 2020-05-25 NOTE — Telephone Encounter (Signed)
Please refill as per office routine med refill policy (all routine meds refilled for 3 mo or monthly per pt preference up to one year from last visit, then month to month grace period for 3 mo, then further med refills will have to be denied)  

## 2020-05-28 DIAGNOSIS — E119 Type 2 diabetes mellitus without complications: Secondary | ICD-10-CM | POA: Diagnosis not present

## 2020-05-28 DIAGNOSIS — C7949 Secondary malignant neoplasm of other parts of nervous system: Secondary | ICD-10-CM | POA: Diagnosis not present

## 2020-05-28 DIAGNOSIS — J45909 Unspecified asthma, uncomplicated: Secondary | ICD-10-CM | POA: Diagnosis not present

## 2020-05-28 DIAGNOSIS — Z85038 Personal history of other malignant neoplasm of large intestine: Secondary | ICD-10-CM | POA: Diagnosis not present

## 2020-05-28 DIAGNOSIS — I10 Essential (primary) hypertension: Secondary | ICD-10-CM | POA: Diagnosis not present

## 2020-05-28 DIAGNOSIS — E785 Hyperlipidemia, unspecified: Secondary | ICD-10-CM | POA: Diagnosis not present

## 2020-05-28 DIAGNOSIS — C7951 Secondary malignant neoplasm of bone: Secondary | ICD-10-CM | POA: Diagnosis not present

## 2020-05-28 DIAGNOSIS — Z87891 Personal history of nicotine dependence: Secondary | ICD-10-CM | POA: Diagnosis not present

## 2020-05-28 DIAGNOSIS — Z7982 Long term (current) use of aspirin: Secondary | ICD-10-CM | POA: Diagnosis not present

## 2020-05-28 DIAGNOSIS — Z4659 Encounter for fitting and adjustment of other gastrointestinal appliance and device: Secondary | ICD-10-CM | POA: Diagnosis not present

## 2020-05-28 DIAGNOSIS — E079 Disorder of thyroid, unspecified: Secondary | ICD-10-CM | POA: Diagnosis not present

## 2020-05-28 DIAGNOSIS — K219 Gastro-esophageal reflux disease without esophagitis: Secondary | ICD-10-CM | POA: Diagnosis not present

## 2020-05-28 DIAGNOSIS — Z8349 Family history of other endocrine, nutritional and metabolic diseases: Secondary | ICD-10-CM | POA: Diagnosis not present

## 2020-05-28 DIAGNOSIS — Z794 Long term (current) use of insulin: Secondary | ICD-10-CM | POA: Diagnosis not present

## 2020-05-28 DIAGNOSIS — C310 Malignant neoplasm of maxillary sinus: Secondary | ICD-10-CM | POA: Diagnosis not present

## 2020-05-28 DIAGNOSIS — Z833 Family history of diabetes mellitus: Secondary | ICD-10-CM | POA: Diagnosis not present

## 2020-06-08 ENCOUNTER — Other Ambulatory Visit: Payer: Self-pay | Admitting: Internal Medicine

## 2020-06-10 ENCOUNTER — Other Ambulatory Visit: Payer: Medicare Other

## 2020-06-11 ENCOUNTER — Other Ambulatory Visit: Payer: Self-pay | Admitting: Internal Medicine

## 2020-06-11 NOTE — Telephone Encounter (Signed)
Please refill as per office routine med refill policy (all routine meds refilled for 3 mo or monthly per pt preference up to one year from last visit, then month to month grace period for 3 mo, then further med refills will have to be denied)  

## 2020-06-16 ENCOUNTER — Ambulatory Visit: Payer: Medicare Other | Admitting: Internal Medicine

## 2020-06-24 DIAGNOSIS — R1313 Dysphagia, pharyngeal phase: Secondary | ICD-10-CM | POA: Diagnosis not present

## 2020-06-24 DIAGNOSIS — R1312 Dysphagia, oropharyngeal phase: Secondary | ICD-10-CM | POA: Diagnosis not present

## 2020-06-26 DIAGNOSIS — Z87891 Personal history of nicotine dependence: Secondary | ICD-10-CM | POA: Diagnosis not present

## 2020-06-26 DIAGNOSIS — Z888 Allergy status to other drugs, medicaments and biological substances status: Secondary | ICD-10-CM | POA: Diagnosis not present

## 2020-06-26 DIAGNOSIS — Z8542 Personal history of malignant neoplasm of other parts of uterus: Secondary | ICD-10-CM | POA: Insufficient documentation

## 2020-06-26 DIAGNOSIS — Z510 Encounter for antineoplastic radiation therapy: Secondary | ICD-10-CM | POA: Diagnosis not present

## 2020-06-26 DIAGNOSIS — C310 Malignant neoplasm of maxillary sinus: Secondary | ICD-10-CM | POA: Diagnosis not present

## 2020-06-26 DIAGNOSIS — Z85038 Personal history of other malignant neoplasm of large intestine: Secondary | ICD-10-CM | POA: Diagnosis not present

## 2020-07-01 ENCOUNTER — Other Ambulatory Visit: Payer: Self-pay | Admitting: Internal Medicine

## 2020-07-01 DIAGNOSIS — C310 Malignant neoplasm of maxillary sinus: Secondary | ICD-10-CM

## 2020-07-01 NOTE — Telephone Encounter (Signed)
Done erx 

## 2020-07-08 DIAGNOSIS — R937 Abnormal findings on diagnostic imaging of other parts of musculoskeletal system: Secondary | ICD-10-CM | POA: Diagnosis not present

## 2020-07-08 DIAGNOSIS — R599 Enlarged lymph nodes, unspecified: Secondary | ICD-10-CM | POA: Diagnosis not present

## 2020-07-08 DIAGNOSIS — C310 Malignant neoplasm of maxillary sinus: Secondary | ICD-10-CM | POA: Diagnosis not present

## 2020-07-08 DIAGNOSIS — E079 Disorder of thyroid, unspecified: Secondary | ICD-10-CM | POA: Diagnosis not present

## 2020-07-08 DIAGNOSIS — M6289 Other specified disorders of muscle: Secondary | ICD-10-CM | POA: Diagnosis not present

## 2020-07-13 ENCOUNTER — Other Ambulatory Visit: Payer: Self-pay | Admitting: Internal Medicine

## 2020-07-13 NOTE — Telephone Encounter (Signed)
Please refill as per office routine med refill policy (all routine meds refilled for 3 mo or monthly per pt preference up to one year from last visit, then month to month grace period for 3 mo, then further med refills will have to be denied)  

## 2020-07-14 DIAGNOSIS — C760 Malignant neoplasm of head, face and neck: Secondary | ICD-10-CM | POA: Insufficient documentation

## 2020-07-14 DIAGNOSIS — Z87891 Personal history of nicotine dependence: Secondary | ICD-10-CM | POA: Diagnosis not present

## 2020-07-14 DIAGNOSIS — C310 Malignant neoplasm of maxillary sinus: Secondary | ICD-10-CM | POA: Diagnosis not present

## 2020-07-14 DIAGNOSIS — Z483 Aftercare following surgery for neoplasm: Secondary | ICD-10-CM | POA: Diagnosis not present

## 2020-07-15 DIAGNOSIS — R1312 Dysphagia, oropharyngeal phase: Secondary | ICD-10-CM | POA: Diagnosis not present

## 2020-07-15 DIAGNOSIS — C310 Malignant neoplasm of maxillary sinus: Secondary | ICD-10-CM | POA: Diagnosis not present

## 2020-07-15 DIAGNOSIS — Z483 Aftercare following surgery for neoplasm: Secondary | ICD-10-CM | POA: Diagnosis not present

## 2020-07-20 DIAGNOSIS — Z510 Encounter for antineoplastic radiation therapy: Secondary | ICD-10-CM | POA: Diagnosis not present

## 2020-07-20 DIAGNOSIS — C310 Malignant neoplasm of maxillary sinus: Secondary | ICD-10-CM | POA: Diagnosis not present

## 2020-07-20 DIAGNOSIS — Z85038 Personal history of other malignant neoplasm of large intestine: Secondary | ICD-10-CM | POA: Diagnosis not present

## 2020-07-28 ENCOUNTER — Other Ambulatory Visit: Payer: Self-pay | Admitting: Internal Medicine

## 2020-07-29 ENCOUNTER — Other Ambulatory Visit: Payer: Self-pay | Admitting: Internal Medicine

## 2020-08-03 ENCOUNTER — Other Ambulatory Visit: Payer: Self-pay | Admitting: Endocrinology

## 2020-08-03 DIAGNOSIS — Z85038 Personal history of other malignant neoplasm of large intestine: Secondary | ICD-10-CM | POA: Diagnosis not present

## 2020-08-03 DIAGNOSIS — C310 Malignant neoplasm of maxillary sinus: Secondary | ICD-10-CM | POA: Diagnosis not present

## 2020-08-03 DIAGNOSIS — Z510 Encounter for antineoplastic radiation therapy: Secondary | ICD-10-CM | POA: Diagnosis not present

## 2020-08-05 DIAGNOSIS — Z85038 Personal history of other malignant neoplasm of large intestine: Secondary | ICD-10-CM | POA: Diagnosis not present

## 2020-08-05 DIAGNOSIS — Z510 Encounter for antineoplastic radiation therapy: Secondary | ICD-10-CM | POA: Diagnosis not present

## 2020-08-05 DIAGNOSIS — C310 Malignant neoplasm of maxillary sinus: Secondary | ICD-10-CM | POA: Diagnosis not present

## 2020-08-19 DIAGNOSIS — Z888 Allergy status to other drugs, medicaments and biological substances status: Secondary | ICD-10-CM | POA: Diagnosis not present

## 2020-08-19 DIAGNOSIS — L02811 Cutaneous abscess of head [any part, except face]: Secondary | ICD-10-CM | POA: Diagnosis not present

## 2020-08-21 ENCOUNTER — Other Ambulatory Visit: Payer: Self-pay | Admitting: Internal Medicine

## 2020-08-24 DIAGNOSIS — C310 Malignant neoplasm of maxillary sinus: Secondary | ICD-10-CM | POA: Diagnosis not present

## 2020-08-24 DIAGNOSIS — Z510 Encounter for antineoplastic radiation therapy: Secondary | ICD-10-CM | POA: Diagnosis not present

## 2020-08-24 DIAGNOSIS — Z85038 Personal history of other malignant neoplasm of large intestine: Secondary | ICD-10-CM | POA: Diagnosis not present

## 2020-08-25 DIAGNOSIS — Z510 Encounter for antineoplastic radiation therapy: Secondary | ICD-10-CM | POA: Diagnosis not present

## 2020-08-25 DIAGNOSIS — Z85038 Personal history of other malignant neoplasm of large intestine: Secondary | ICD-10-CM | POA: Diagnosis not present

## 2020-08-25 DIAGNOSIS — C310 Malignant neoplasm of maxillary sinus: Secondary | ICD-10-CM | POA: Diagnosis not present

## 2020-08-26 DIAGNOSIS — C310 Malignant neoplasm of maxillary sinus: Secondary | ICD-10-CM | POA: Diagnosis not present

## 2020-08-26 DIAGNOSIS — Z85038 Personal history of other malignant neoplasm of large intestine: Secondary | ICD-10-CM | POA: Diagnosis not present

## 2020-08-26 DIAGNOSIS — Z510 Encounter for antineoplastic radiation therapy: Secondary | ICD-10-CM | POA: Diagnosis not present

## 2020-08-27 DIAGNOSIS — Z510 Encounter for antineoplastic radiation therapy: Secondary | ICD-10-CM | POA: Diagnosis not present

## 2020-08-27 DIAGNOSIS — C310 Malignant neoplasm of maxillary sinus: Secondary | ICD-10-CM | POA: Diagnosis not present

## 2020-08-27 DIAGNOSIS — Z85038 Personal history of other malignant neoplasm of large intestine: Secondary | ICD-10-CM | POA: Diagnosis not present

## 2020-08-28 DIAGNOSIS — Z85038 Personal history of other malignant neoplasm of large intestine: Secondary | ICD-10-CM | POA: Diagnosis not present

## 2020-08-28 DIAGNOSIS — Z510 Encounter for antineoplastic radiation therapy: Secondary | ICD-10-CM | POA: Diagnosis not present

## 2020-08-28 DIAGNOSIS — C310 Malignant neoplasm of maxillary sinus: Secondary | ICD-10-CM | POA: Diagnosis not present

## 2020-08-31 DIAGNOSIS — R1312 Dysphagia, oropharyngeal phase: Secondary | ICD-10-CM | POA: Diagnosis not present

## 2020-08-31 DIAGNOSIS — C310 Malignant neoplasm of maxillary sinus: Secondary | ICD-10-CM | POA: Diagnosis not present

## 2020-08-31 DIAGNOSIS — Z85038 Personal history of other malignant neoplasm of large intestine: Secondary | ICD-10-CM | POA: Diagnosis not present

## 2020-08-31 DIAGNOSIS — Z510 Encounter for antineoplastic radiation therapy: Secondary | ICD-10-CM | POA: Diagnosis not present

## 2020-09-01 DIAGNOSIS — Z85038 Personal history of other malignant neoplasm of large intestine: Secondary | ICD-10-CM | POA: Diagnosis not present

## 2020-09-01 DIAGNOSIS — Z510 Encounter for antineoplastic radiation therapy: Secondary | ICD-10-CM | POA: Diagnosis not present

## 2020-09-01 DIAGNOSIS — C310 Malignant neoplasm of maxillary sinus: Secondary | ICD-10-CM | POA: Diagnosis not present

## 2020-09-02 DIAGNOSIS — Z85038 Personal history of other malignant neoplasm of large intestine: Secondary | ICD-10-CM | POA: Diagnosis not present

## 2020-09-02 DIAGNOSIS — C310 Malignant neoplasm of maxillary sinus: Secondary | ICD-10-CM | POA: Diagnosis not present

## 2020-09-02 DIAGNOSIS — Z510 Encounter for antineoplastic radiation therapy: Secondary | ICD-10-CM | POA: Diagnosis not present

## 2020-09-03 DIAGNOSIS — Z85038 Personal history of other malignant neoplasm of large intestine: Secondary | ICD-10-CM | POA: Diagnosis not present

## 2020-09-03 DIAGNOSIS — C310 Malignant neoplasm of maxillary sinus: Secondary | ICD-10-CM | POA: Diagnosis not present

## 2020-09-03 DIAGNOSIS — Z510 Encounter for antineoplastic radiation therapy: Secondary | ICD-10-CM | POA: Diagnosis not present

## 2020-09-04 DIAGNOSIS — C310 Malignant neoplasm of maxillary sinus: Secondary | ICD-10-CM | POA: Diagnosis not present

## 2020-09-04 DIAGNOSIS — Z510 Encounter for antineoplastic radiation therapy: Secondary | ICD-10-CM | POA: Diagnosis not present

## 2020-09-04 DIAGNOSIS — Z85038 Personal history of other malignant neoplasm of large intestine: Secondary | ICD-10-CM | POA: Diagnosis not present

## 2020-09-07 DIAGNOSIS — Z85038 Personal history of other malignant neoplasm of large intestine: Secondary | ICD-10-CM | POA: Diagnosis not present

## 2020-09-07 DIAGNOSIS — Z510 Encounter for antineoplastic radiation therapy: Secondary | ICD-10-CM | POA: Diagnosis not present

## 2020-09-07 DIAGNOSIS — C310 Malignant neoplasm of maxillary sinus: Secondary | ICD-10-CM | POA: Diagnosis not present

## 2020-09-08 DIAGNOSIS — C310 Malignant neoplasm of maxillary sinus: Secondary | ICD-10-CM | POA: Diagnosis not present

## 2020-09-08 DIAGNOSIS — Z85038 Personal history of other malignant neoplasm of large intestine: Secondary | ICD-10-CM | POA: Diagnosis not present

## 2020-09-08 DIAGNOSIS — Z510 Encounter for antineoplastic radiation therapy: Secondary | ICD-10-CM | POA: Diagnosis not present

## 2020-09-09 DIAGNOSIS — Z510 Encounter for antineoplastic radiation therapy: Secondary | ICD-10-CM | POA: Diagnosis not present

## 2020-09-09 DIAGNOSIS — Z85038 Personal history of other malignant neoplasm of large intestine: Secondary | ICD-10-CM | POA: Diagnosis not present

## 2020-09-09 DIAGNOSIS — C310 Malignant neoplasm of maxillary sinus: Secondary | ICD-10-CM | POA: Diagnosis not present

## 2020-09-10 DIAGNOSIS — Z510 Encounter for antineoplastic radiation therapy: Secondary | ICD-10-CM | POA: Diagnosis not present

## 2020-09-10 DIAGNOSIS — Z85038 Personal history of other malignant neoplasm of large intestine: Secondary | ICD-10-CM | POA: Diagnosis not present

## 2020-09-10 DIAGNOSIS — C310 Malignant neoplasm of maxillary sinus: Secondary | ICD-10-CM | POA: Diagnosis not present

## 2020-09-11 DIAGNOSIS — Z85038 Personal history of other malignant neoplasm of large intestine: Secondary | ICD-10-CM | POA: Diagnosis not present

## 2020-09-11 DIAGNOSIS — Z510 Encounter for antineoplastic radiation therapy: Secondary | ICD-10-CM | POA: Diagnosis not present

## 2020-09-11 DIAGNOSIS — C310 Malignant neoplasm of maxillary sinus: Secondary | ICD-10-CM | POA: Diagnosis not present

## 2020-09-12 ENCOUNTER — Other Ambulatory Visit: Payer: Self-pay | Admitting: Internal Medicine

## 2020-09-12 NOTE — Telephone Encounter (Signed)
Please refill as per office routine med refill policy (all routine meds refilled for 3 mo or monthly per pt preference up to one year from last visit, then month to month grace period for 3 mo, then further med refills will have to be denied)  

## 2020-09-14 DIAGNOSIS — Z85038 Personal history of other malignant neoplasm of large intestine: Secondary | ICD-10-CM | POA: Diagnosis not present

## 2020-09-14 DIAGNOSIS — Z510 Encounter for antineoplastic radiation therapy: Secondary | ICD-10-CM | POA: Diagnosis not present

## 2020-09-14 DIAGNOSIS — C310 Malignant neoplasm of maxillary sinus: Secondary | ICD-10-CM | POA: Diagnosis not present

## 2020-09-15 DIAGNOSIS — Z85038 Personal history of other malignant neoplasm of large intestine: Secondary | ICD-10-CM | POA: Diagnosis not present

## 2020-09-15 DIAGNOSIS — C310 Malignant neoplasm of maxillary sinus: Secondary | ICD-10-CM | POA: Diagnosis not present

## 2020-09-15 DIAGNOSIS — Z510 Encounter for antineoplastic radiation therapy: Secondary | ICD-10-CM | POA: Diagnosis not present

## 2020-09-16 DIAGNOSIS — Z85038 Personal history of other malignant neoplasm of large intestine: Secondary | ICD-10-CM | POA: Diagnosis not present

## 2020-09-16 DIAGNOSIS — C310 Malignant neoplasm of maxillary sinus: Secondary | ICD-10-CM | POA: Diagnosis not present

## 2020-09-16 DIAGNOSIS — Z510 Encounter for antineoplastic radiation therapy: Secondary | ICD-10-CM | POA: Diagnosis not present

## 2020-09-17 DIAGNOSIS — Z85038 Personal history of other malignant neoplasm of large intestine: Secondary | ICD-10-CM | POA: Diagnosis not present

## 2020-09-17 DIAGNOSIS — C310 Malignant neoplasm of maxillary sinus: Secondary | ICD-10-CM | POA: Diagnosis not present

## 2020-09-17 DIAGNOSIS — Z510 Encounter for antineoplastic radiation therapy: Secondary | ICD-10-CM | POA: Diagnosis not present

## 2020-09-18 DIAGNOSIS — C310 Malignant neoplasm of maxillary sinus: Secondary | ICD-10-CM | POA: Diagnosis not present

## 2020-09-18 DIAGNOSIS — Z510 Encounter for antineoplastic radiation therapy: Secondary | ICD-10-CM | POA: Diagnosis not present

## 2020-09-18 DIAGNOSIS — Z85038 Personal history of other malignant neoplasm of large intestine: Secondary | ICD-10-CM | POA: Diagnosis not present

## 2020-09-21 DIAGNOSIS — R1312 Dysphagia, oropharyngeal phase: Secondary | ICD-10-CM | POA: Diagnosis not present

## 2020-09-21 DIAGNOSIS — Z85038 Personal history of other malignant neoplasm of large intestine: Secondary | ICD-10-CM | POA: Diagnosis not present

## 2020-09-21 DIAGNOSIS — Z510 Encounter for antineoplastic radiation therapy: Secondary | ICD-10-CM | POA: Diagnosis not present

## 2020-09-21 DIAGNOSIS — Z923 Personal history of irradiation: Secondary | ICD-10-CM | POA: Diagnosis not present

## 2020-09-21 DIAGNOSIS — C310 Malignant neoplasm of maxillary sinus: Secondary | ICD-10-CM | POA: Diagnosis not present

## 2020-09-22 DIAGNOSIS — C310 Malignant neoplasm of maxillary sinus: Secondary | ICD-10-CM | POA: Diagnosis not present

## 2020-09-22 DIAGNOSIS — Z85038 Personal history of other malignant neoplasm of large intestine: Secondary | ICD-10-CM | POA: Diagnosis not present

## 2020-09-22 DIAGNOSIS — Z510 Encounter for antineoplastic radiation therapy: Secondary | ICD-10-CM | POA: Diagnosis not present

## 2020-09-23 ENCOUNTER — Other Ambulatory Visit: Payer: Self-pay | Admitting: Internal Medicine

## 2020-09-23 DIAGNOSIS — Z510 Encounter for antineoplastic radiation therapy: Secondary | ICD-10-CM | POA: Diagnosis not present

## 2020-09-23 DIAGNOSIS — Z85038 Personal history of other malignant neoplasm of large intestine: Secondary | ICD-10-CM | POA: Diagnosis not present

## 2020-09-23 DIAGNOSIS — C310 Malignant neoplasm of maxillary sinus: Secondary | ICD-10-CM | POA: Diagnosis not present

## 2020-09-24 ENCOUNTER — Other Ambulatory Visit: Payer: Self-pay | Admitting: Internal Medicine

## 2020-09-24 DIAGNOSIS — Z510 Encounter for antineoplastic radiation therapy: Secondary | ICD-10-CM | POA: Diagnosis not present

## 2020-09-24 DIAGNOSIS — Z85038 Personal history of other malignant neoplasm of large intestine: Secondary | ICD-10-CM | POA: Diagnosis not present

## 2020-09-24 DIAGNOSIS — C310 Malignant neoplasm of maxillary sinus: Secondary | ICD-10-CM | POA: Diagnosis not present

## 2020-09-24 NOTE — Telephone Encounter (Signed)
Please refill as per office routine med refill policy (all routine meds refilled for 3 mo or monthly per pt preference up to one year from last visit, then month to month grace period for 3 mo, then further med refills will have to be denied)  

## 2020-09-25 DIAGNOSIS — Z510 Encounter for antineoplastic radiation therapy: Secondary | ICD-10-CM | POA: Diagnosis not present

## 2020-09-25 DIAGNOSIS — C310 Malignant neoplasm of maxillary sinus: Secondary | ICD-10-CM | POA: Diagnosis not present

## 2020-09-25 DIAGNOSIS — Z85038 Personal history of other malignant neoplasm of large intestine: Secondary | ICD-10-CM | POA: Diagnosis not present

## 2020-09-28 ENCOUNTER — Other Ambulatory Visit: Payer: Self-pay | Admitting: Internal Medicine

## 2020-09-28 DIAGNOSIS — Z85038 Personal history of other malignant neoplasm of large intestine: Secondary | ICD-10-CM | POA: Diagnosis not present

## 2020-09-28 DIAGNOSIS — Z510 Encounter for antineoplastic radiation therapy: Secondary | ICD-10-CM | POA: Diagnosis not present

## 2020-09-28 DIAGNOSIS — C310 Malignant neoplasm of maxillary sinus: Secondary | ICD-10-CM | POA: Diagnosis not present

## 2020-09-28 NOTE — Telephone Encounter (Signed)
Please refill as per office routine med refill policy (all routine meds refilled for 3 mo or monthly per pt preference up to one year from last visit, then month to month grace period for 3 mo, then further med refills will have to be denied)  

## 2020-09-29 DIAGNOSIS — Z85038 Personal history of other malignant neoplasm of large intestine: Secondary | ICD-10-CM | POA: Diagnosis not present

## 2020-09-29 DIAGNOSIS — C310 Malignant neoplasm of maxillary sinus: Secondary | ICD-10-CM | POA: Diagnosis not present

## 2020-09-29 DIAGNOSIS — Z510 Encounter for antineoplastic radiation therapy: Secondary | ICD-10-CM | POA: Diagnosis not present

## 2020-09-30 DIAGNOSIS — Z85038 Personal history of other malignant neoplasm of large intestine: Secondary | ICD-10-CM | POA: Diagnosis not present

## 2020-09-30 DIAGNOSIS — Z510 Encounter for antineoplastic radiation therapy: Secondary | ICD-10-CM | POA: Diagnosis not present

## 2020-09-30 DIAGNOSIS — C310 Malignant neoplasm of maxillary sinus: Secondary | ICD-10-CM | POA: Diagnosis not present

## 2020-10-01 ENCOUNTER — Other Ambulatory Visit: Payer: Self-pay | Admitting: Internal Medicine

## 2020-10-01 DIAGNOSIS — Z510 Encounter for antineoplastic radiation therapy: Secondary | ICD-10-CM | POA: Diagnosis not present

## 2020-10-01 DIAGNOSIS — Z85038 Personal history of other malignant neoplasm of large intestine: Secondary | ICD-10-CM | POA: Diagnosis not present

## 2020-10-01 DIAGNOSIS — C310 Malignant neoplasm of maxillary sinus: Secondary | ICD-10-CM | POA: Diagnosis not present

## 2020-10-01 NOTE — Telephone Encounter (Signed)
Please refill as per office routine med refill policy (all routine meds refilled for 3 mo or monthly per pt preference up to one year from last visit, then month to month grace period for 3 mo, then further med refills will have to be denied)  

## 2020-10-02 DIAGNOSIS — C310 Malignant neoplasm of maxillary sinus: Secondary | ICD-10-CM | POA: Diagnosis not present

## 2020-10-02 DIAGNOSIS — Z85038 Personal history of other malignant neoplasm of large intestine: Secondary | ICD-10-CM | POA: Diagnosis not present

## 2020-10-02 DIAGNOSIS — Z510 Encounter for antineoplastic radiation therapy: Secondary | ICD-10-CM | POA: Diagnosis not present

## 2020-10-05 DIAGNOSIS — C310 Malignant neoplasm of maxillary sinus: Secondary | ICD-10-CM | POA: Diagnosis not present

## 2020-10-05 DIAGNOSIS — Z923 Personal history of irradiation: Secondary | ICD-10-CM | POA: Diagnosis not present

## 2020-10-05 DIAGNOSIS — R1312 Dysphagia, oropharyngeal phase: Secondary | ICD-10-CM | POA: Diagnosis not present

## 2020-10-05 DIAGNOSIS — Z510 Encounter for antineoplastic radiation therapy: Secondary | ICD-10-CM | POA: Diagnosis not present

## 2020-10-05 DIAGNOSIS — Z85038 Personal history of other malignant neoplasm of large intestine: Secondary | ICD-10-CM | POA: Diagnosis not present

## 2020-10-06 DIAGNOSIS — Z510 Encounter for antineoplastic radiation therapy: Secondary | ICD-10-CM | POA: Diagnosis not present

## 2020-10-06 DIAGNOSIS — Z85038 Personal history of other malignant neoplasm of large intestine: Secondary | ICD-10-CM | POA: Diagnosis not present

## 2020-10-06 DIAGNOSIS — C310 Malignant neoplasm of maxillary sinus: Secondary | ICD-10-CM | POA: Diagnosis not present

## 2020-10-07 DIAGNOSIS — C310 Malignant neoplasm of maxillary sinus: Secondary | ICD-10-CM | POA: Diagnosis not present

## 2020-10-07 DIAGNOSIS — Z510 Encounter for antineoplastic radiation therapy: Secondary | ICD-10-CM | POA: Diagnosis not present

## 2020-10-07 DIAGNOSIS — Z85038 Personal history of other malignant neoplasm of large intestine: Secondary | ICD-10-CM | POA: Diagnosis not present

## 2020-10-10 ENCOUNTER — Other Ambulatory Visit: Payer: Self-pay | Admitting: Internal Medicine

## 2020-10-10 NOTE — Telephone Encounter (Signed)
Please refill as per office routine med refill policy (all routine meds refilled for 3 mo or monthly per pt preference up to one year from last visit, then month to month grace period for 3 mo, then further med refills will have to be denied)  

## 2020-10-28 ENCOUNTER — Other Ambulatory Visit: Payer: Self-pay | Admitting: Endocrinology

## 2020-11-08 ENCOUNTER — Other Ambulatory Visit: Payer: Self-pay | Admitting: Internal Medicine

## 2020-11-08 NOTE — Telephone Encounter (Signed)
Please refill as per office routine med refill policy (all routine meds refilled for 3 mo or monthly per pt preference up to one year from last visit, then month to month grace period for 3 mo, then further med refills will have to be denied)  

## 2020-11-12 DIAGNOSIS — Z9889 Other specified postprocedural states: Secondary | ICD-10-CM | POA: Diagnosis not present

## 2020-11-12 DIAGNOSIS — Z923 Personal history of irradiation: Secondary | ICD-10-CM | POA: Diagnosis not present

## 2020-11-12 DIAGNOSIS — C310 Malignant neoplasm of maxillary sinus: Secondary | ICD-10-CM | POA: Diagnosis not present

## 2020-11-16 DIAGNOSIS — H9211 Otorrhea, right ear: Secondary | ICD-10-CM | POA: Diagnosis not present

## 2020-11-16 DIAGNOSIS — Z9889 Other specified postprocedural states: Secondary | ICD-10-CM | POA: Diagnosis not present

## 2020-11-16 DIAGNOSIS — C310 Malignant neoplasm of maxillary sinus: Secondary | ICD-10-CM | POA: Diagnosis not present

## 2020-11-16 DIAGNOSIS — J3489 Other specified disorders of nose and nasal sinuses: Secondary | ICD-10-CM | POA: Diagnosis not present

## 2020-11-28 DIAGNOSIS — E119 Type 2 diabetes mellitus without complications: Secondary | ICD-10-CM | POA: Diagnosis not present

## 2020-11-28 DIAGNOSIS — Z0100 Encounter for examination of eyes and vision without abnormal findings: Secondary | ICD-10-CM | POA: Diagnosis not present

## 2020-12-08 ENCOUNTER — Other Ambulatory Visit: Payer: Self-pay | Admitting: Internal Medicine

## 2020-12-08 NOTE — Telephone Encounter (Signed)
Please refill as per office routine med refill policy (all routine meds refilled for 3 mo or monthly per pt preference up to one year from last visit, then month to month grace period for 3 mo, then further med refills will have to be denied)  

## 2020-12-09 ENCOUNTER — Other Ambulatory Visit: Payer: Self-pay

## 2020-12-09 ENCOUNTER — Other Ambulatory Visit (INDEPENDENT_AMBULATORY_CARE_PROVIDER_SITE_OTHER): Payer: Medicare Other

## 2020-12-09 ENCOUNTER — Other Ambulatory Visit: Payer: Medicare Other

## 2020-12-09 DIAGNOSIS — E538 Deficiency of other specified B group vitamins: Secondary | ICD-10-CM

## 2020-12-09 DIAGNOSIS — E1140 Type 2 diabetes mellitus with diabetic neuropathy, unspecified: Secondary | ICD-10-CM

## 2020-12-09 DIAGNOSIS — E559 Vitamin D deficiency, unspecified: Secondary | ICD-10-CM

## 2020-12-09 LAB — URINALYSIS, ROUTINE W REFLEX MICROSCOPIC
Bilirubin Urine: NEGATIVE
Hgb urine dipstick: NEGATIVE
Ketones, ur: NEGATIVE
Leukocytes,Ua: NEGATIVE
Nitrite: NEGATIVE
Specific Gravity, Urine: 1.025 (ref 1.000–1.030)
Total Protein, Urine: NEGATIVE
Urine Glucose: NEGATIVE
Urobilinogen, UA: 0.2 (ref 0.0–1.0)
pH: 5.5 (ref 5.0–8.0)

## 2020-12-09 LAB — MICROALBUMIN / CREATININE URINE RATIO
Creatinine,U: 114.2 mg/dL
Microalb Creat Ratio: 1.6 mg/g (ref 0.0–30.0)
Microalb, Ur: 1.8 mg/dL (ref 0.0–1.9)

## 2020-12-09 LAB — BASIC METABOLIC PANEL
BUN: 20 mg/dL (ref 6–23)
CO2: 31 mEq/L (ref 19–32)
Calcium: 9.1 mg/dL (ref 8.4–10.5)
Chloride: 98 mEq/L (ref 96–112)
Creatinine, Ser: 0.85 mg/dL (ref 0.40–1.20)
GFR: 67.09 mL/min (ref >60.00)
Glucose, Bld: 91 mg/dL (ref 70–99)
Potassium: 3.8 mEq/L (ref 3.5–5.1)
Sodium: 136 mEq/L (ref 135–145)

## 2020-12-09 LAB — CBC WITH DIFFERENTIAL/PLATELET
Basophils Absolute: 0 10*3/uL (ref 0.0–0.1)
Basophils Relative: 0.3 % (ref 0.0–3.0)
Eosinophils Absolute: 0.2 10*3/uL (ref 0.0–0.7)
Eosinophils Relative: 4.6 % (ref 0.0–5.0)
HCT: 30.9 % — ABNORMAL LOW (ref 36.0–46.0)
Hemoglobin: 9.7 g/dL — ABNORMAL LOW (ref 12.0–15.0)
Lymphocytes Relative: 13.1 % (ref 12.0–46.0)
Lymphs Abs: 0.5 10*3/uL — ABNORMAL LOW (ref 0.7–4.0)
MCHC: 31.4 g/dL (ref 30.0–36.0)
MCV: 65.9 fl — ABNORMAL LOW (ref 78.0–100.0)
Monocytes Absolute: 0.3 10*3/uL (ref 0.1–1.0)
Monocytes Relative: 7.4 % (ref 3.0–12.0)
Neutro Abs: 3.1 10*3/uL (ref 1.4–7.7)
Neutrophils Relative %: 74.6 % (ref 43.0–77.0)
Platelets: 287 10*3/uL (ref 150.0–400.0)
RBC: 4.68 Mil/uL (ref 3.87–5.11)
RDW: 21.1 % — ABNORMAL HIGH (ref 11.5–15.5)
WBC: 4.2 10*3/uL (ref 4.0–10.5)

## 2020-12-09 LAB — HEPATIC FUNCTION PANEL
ALT: 17 U/L (ref 0–35)
AST: 22 U/L (ref 0–37)
Albumin: 3.9 g/dL (ref 3.5–5.2)
Alkaline Phosphatase: 60 U/L (ref 39–117)
Bilirubin, Direct: 0.1 mg/dL (ref 0.0–0.3)
Total Bilirubin: 0.4 mg/dL (ref 0.2–1.2)
Total Protein: 6.6 g/dL (ref 6.0–8.3)

## 2020-12-09 LAB — LIPID PANEL
Cholesterol: 127 mg/dL (ref 0–200)
HDL: 55.1 mg/dL (ref >39.00)
LDL Cholesterol: 56 mg/dL (ref 0–99)
NonHDL: 72.05
Total CHOL/HDL Ratio: 2
Triglycerides: 80 mg/dL (ref 0.0–149.0)
VLDL: 16 mg/dL (ref 0.0–40.0)

## 2020-12-09 LAB — VITAMIN D 25 HYDROXY (VIT D DEFICIENCY, FRACTURES): VITD: 93.09 ng/mL (ref 30.00–100.00)

## 2020-12-09 LAB — TSH: TSH: 3.54 u[IU]/mL (ref 0.35–4.50)

## 2020-12-09 LAB — VITAMIN B12: Vitamin B-12: >1526 pg/mL — ABNORMAL HIGH (ref 211–911)

## 2020-12-09 NOTE — Progress Notes (Deleted)
Please for addon labs if possible:  Iron panel Ferritin  For Anemia NOS  This is due to labs reviewed today shows rather large decreased Hgb over last yr  If unable to addon, please ask pt to lab to have them drawn, thanks

## 2020-12-09 NOTE — Patient Instructions (Signed)
See nnote

## 2020-12-10 LAB — HEMOGLOBIN A1C
Hgb A1c MFr Bld: 4.8 % of total Hgb (ref <5.7)
Mean Plasma Glucose: 91 mg/dL
eAG (mmol/L): 5 mmol/L

## 2020-12-12 HISTORY — PX: NECK SURGERY: SHX720

## 2020-12-14 ENCOUNTER — Telehealth: Payer: Self-pay | Admitting: Internal Medicine

## 2020-12-14 DIAGNOSIS — E611 Iron deficiency: Secondary | ICD-10-CM

## 2020-12-14 NOTE — Telephone Encounter (Signed)
Ok labs are ordered  Please assist pt with getting lab appt for thur

## 2020-12-14 NOTE — Telephone Encounter (Signed)
-----   Message from Barbara Cower, CMA sent at 12/14/2020 12:14 PM EST ----- Pt stated she comes in Thursday for an ov appointment nad is wondering if you can order the labs for her to get done on Thursday morning.

## 2020-12-17 ENCOUNTER — Ambulatory Visit (INDEPENDENT_AMBULATORY_CARE_PROVIDER_SITE_OTHER): Payer: Medicare Other | Admitting: Internal Medicine

## 2020-12-17 ENCOUNTER — Other Ambulatory Visit: Payer: Self-pay | Admitting: Internal Medicine

## 2020-12-17 ENCOUNTER — Other Ambulatory Visit (INDEPENDENT_AMBULATORY_CARE_PROVIDER_SITE_OTHER): Payer: Medicare Other

## 2020-12-17 ENCOUNTER — Other Ambulatory Visit: Payer: Self-pay

## 2020-12-17 ENCOUNTER — Encounter: Payer: Self-pay | Admitting: Internal Medicine

## 2020-12-17 VITALS — BP 120/70 | HR 67 | Temp 98.0°F | Ht 63.0 in | Wt 113.0 lb

## 2020-12-17 DIAGNOSIS — D649 Anemia, unspecified: Secondary | ICD-10-CM

## 2020-12-17 DIAGNOSIS — C310 Malignant neoplasm of maxillary sinus: Secondary | ICD-10-CM | POA: Diagnosis not present

## 2020-12-17 DIAGNOSIS — E538 Deficiency of other specified B group vitamins: Secondary | ICD-10-CM

## 2020-12-17 DIAGNOSIS — Z0000 Encounter for general adult medical examination without abnormal findings: Secondary | ICD-10-CM | POA: Diagnosis not present

## 2020-12-17 DIAGNOSIS — H903 Sensorineural hearing loss, bilateral: Secondary | ICD-10-CM | POA: Diagnosis not present

## 2020-12-17 DIAGNOSIS — J45909 Unspecified asthma, uncomplicated: Secondary | ICD-10-CM

## 2020-12-17 DIAGNOSIS — I10 Essential (primary) hypertension: Secondary | ICD-10-CM

## 2020-12-17 DIAGNOSIS — Z0001 Encounter for general adult medical examination with abnormal findings: Secondary | ICD-10-CM

## 2020-12-17 DIAGNOSIS — E559 Vitamin D deficiency, unspecified: Secondary | ICD-10-CM

## 2020-12-17 DIAGNOSIS — E1140 Type 2 diabetes mellitus with diabetic neuropathy, unspecified: Secondary | ICD-10-CM | POA: Diagnosis not present

## 2020-12-17 DIAGNOSIS — E7800 Pure hypercholesterolemia, unspecified: Secondary | ICD-10-CM | POA: Diagnosis not present

## 2020-12-17 DIAGNOSIS — E611 Iron deficiency: Secondary | ICD-10-CM

## 2020-12-17 DIAGNOSIS — H9193 Unspecified hearing loss, bilateral: Secondary | ICD-10-CM | POA: Insufficient documentation

## 2020-12-17 DIAGNOSIS — D509 Iron deficiency anemia, unspecified: Secondary | ICD-10-CM

## 2020-12-17 DIAGNOSIS — C069 Malignant neoplasm of mouth, unspecified: Secondary | ICD-10-CM | POA: Diagnosis not present

## 2020-12-17 LAB — FERRITIN: Ferritin: 4.3 ng/mL — ABNORMAL LOW (ref 10.0–291.0)

## 2020-12-17 LAB — CBC WITH DIFFERENTIAL/PLATELET
Basophils Absolute: 0 10*3/uL (ref 0.0–0.1)
Basophils Relative: 0.9 % (ref 0.0–3.0)
Eosinophils Absolute: 0.1 10*3/uL (ref 0.0–0.7)
Eosinophils Relative: 3.5 % (ref 0.0–5.0)
HCT: 28.9 % — ABNORMAL LOW (ref 36.0–46.0)
Hemoglobin: 9.1 g/dL — ABNORMAL LOW (ref 12.0–15.0)
Lymphocytes Relative: 10.9 % — ABNORMAL LOW (ref 12.0–46.0)
Lymphs Abs: 0.4 10*3/uL — ABNORMAL LOW (ref 0.7–4.0)
MCHC: 31.6 g/dL (ref 30.0–36.0)
MCV: 66.5 fl — ABNORMAL LOW (ref 78.0–100.0)
Monocytes Absolute: 0.4 10*3/uL (ref 0.1–1.0)
Monocytes Relative: 9.5 % (ref 3.0–12.0)
Neutro Abs: 3 10*3/uL (ref 1.4–7.7)
Neutrophils Relative %: 75.2 % (ref 43.0–77.0)
Platelets: 247 10*3/uL (ref 150.0–400.0)
RBC: 4.34 Mil/uL (ref 3.87–5.11)
RDW: 21.4 % — ABNORMAL HIGH (ref 11.5–15.5)
WBC: 4.1 10*3/uL (ref 4.0–10.5)

## 2020-12-17 LAB — IBC PANEL
Iron: 29 ug/dL — ABNORMAL LOW (ref 42–145)
Saturation Ratios: 5.5 % — ABNORMAL LOW (ref 20.0–50.0)
Transferrin: 378 mg/dL — ABNORMAL HIGH (ref 212.0–360.0)

## 2020-12-17 LAB — VITAMIN D 25 HYDROXY (VIT D DEFICIENCY, FRACTURES): VITD: 77.53 ng/mL (ref 30.00–100.00)

## 2020-12-17 LAB — VITAMIN B12: Vitamin B-12: >1526 pg/mL — ABNORMAL HIGH (ref 211–911)

## 2020-12-17 MED ORDER — POLYSACCHARIDE IRON COMPLEX 150 MG PO CAPS: 150.0000 mg | ORAL_CAPSULE | Freq: Every day | ORAL | 1 refills | Status: DC

## 2020-12-17 NOTE — Assessment & Plan Note (Signed)
Post ENT surgury for maxillary sinus cancer 2021 per [facility], now for audiology per [facility] but may not be able to have hearing aids due to small right ear canal

## 2020-12-17 NOTE — Patient Instructions (Signed)
Please continue all other medications as before, and refills have been done if requested.  Please have the pharmacy call with any other refills you may need.  Please continue your efforts at being more active, low cholesterol diet, and weight control.  You are otherwise up to date with prevention measures today.  Please keep your appointments with your specialists as you may have planned  Your follow up blood tests are pending from today; if you have low iron this may need to be treated, and you may need to see a Gastroenterologist to see if there would a reason for this  Please make an Appointment to return in 6 months, or sooner if needed

## 2020-12-17 NOTE — Progress Notes (Addendum)
Established Patient Office Visit  Subjective:  Patient ID: [name] [name], female    DOB: [birth year]  Age: 75 y.o. MRN: [id number]       Chief Complaint: here JSH:[id number] and anemia, DM, HLD, bilateral hearing loss, HTN, vit D and B12 deficiency       HPI:  [name] [name] is a 75 y.o. female here for wellness and above with family, overall doing ok;  Has had extensive eval and tx for oral cancer since 2017, and more recently then for recurrence of malignancy 2021 tx per [facility] s/p surgury and XRT, with unfortunate result of hearing loss.  Has not specific complaints.  Plasn to make her own eye appt soon.  Otherwise,  Pt denies Chest pain, worsening SOB, DOE, wheezing, orthopnea, PND, worsening LE edema, palpitations, dizziness or syncope.  Pt denies other new neurological change such as new headache, facial or extremity weakness.  Pt denies polydipsia, polyuria. Pt states overall good compliance with treatment and medications, good medication tolerability, and has been trying to follow appropriate diet.  Pt denies worsening depressive symptoms, suicidal ideation or panic. Denies fever, night sweats, wt loss, loss of appetite, or other constitutional symptoms.  Pt states good ability with ADL's, has low fall risk, home safety reviewed and adequate, no other significant changes in vision, and not active with exercise.        Wt Readings from Last 3 Encounters:  12/17/20 113 lb (51.3 kg)  12/17/19 115 lb (52.2 kg)  06/26/19 123 lb (55.8 kg)   BP Readings from Last 3 Encounters:  12/17/20 120/70  12/17/19 120/60  06/26/19 124/80       Past Medical History:  Diagnosis Date  . ARTHRITIS 01/30/2008  . ASTHMA 09/03/2007  . Cancer ([place]) 1987   palate cancer  . COLON CANCER 04/07/2008   pt states she had colon cancer 1987  . COPD 09/03/2007  . COPD (chronic obstructive pulmonary disease) ([place]) 04/22/2013  . CORONARY ARTERY DISEASE 09/03/2007  . DEGENERATIVE JOINT DISEASE 01/30/2008  . DIABETES  MELLITUS 01/30/2008  . Diabetic peripheral neuropathy ([place]) 04/22/2013  . Eczema   . GERD 09/03/2007  . GLUCOSE INTOLERANCE 09/03/2007  . GOITER, MULTINODULAR 05/21/2010  . History of blood transfusion   . HYPERCHOLESTEROLEMIA 09/03/2007  . HYPERTENSION 09/03/2007  . Impaired glucose tolerance 04/10/2011  . LVH (left ventricular hypertrophy)   . OBESITY 09/03/2007  . Osteopenia   . Psoriasis   . Shortness of breath dyspnea    With exertion and allergies to pollen   Past Surgical History:  Procedure Laterality Date  . ABDOMINAL HYSTERECTOMY    . CARDIAC CATHETERIZATION    . COLON SURGERY    . FLOOR OF MOUTH BIOPSY N/A 12/24/2015   Procedure: ORAL BIOPSY;  Surgeon: Izora Gala, MD;  Location: [place];  Service: ENT;  Laterality: N/A;  . HEMICOLECTOMY  12/1985   cancer  . HERNIA REPAIR    . MAXILLECTOMY Right 01/22/2016   Procedure: RIGHT SIDE PARTIAL MAXILLECTOMY;  Surgeon: Izora Gala, MD;  Location: [place];  Service: ENT;  Laterality: Right;  . TUBAL LIGATION      reports that she quit smoking about 42 years ago. Her smoking use included cigarettes. She has a 15.00 pack-year smoking history. She has never used smokeless tobacco. She reports that she does not drink alcohol and does not use drugs. family history includes Cancer in her sister and another family member; Colon cancer in her paternal aunt and another family  member; Heart disease in her mother; Liver disease in her father; Thyroid disease in her brother and sister. Allergies  Allergen Reactions  . Simvastatin Other (See Comments)    Muscle spasms   Current Outpatient Medications on File Prior to Visit  Medication Sig Dispense Refill  . acyclovir cream (ZOVIRAX) 5 % Apply 1 application topically daily as needed. For psoriasis    . albuterol (PROVENTIL HFA;VENTOLIN HFA) 108 (90 Base) MCG/ACT inhaler Inhale into the lungs.    Marland Kitchen amLODipine (NORVASC) 5 MG tablet TAKE 1 TABLET (5 MG TOTAL) BY MOUTH DAILY. ANNUAL APPT IS DUE MUST SEE  PROVIDER FOR FUTURE REFILLS 30 tablet 0  . aspirin EC 81 MG tablet Take 1 tablet (81 mg total) by mouth daily. 90 tablet 11  . Blood Glucose Monitoring Suppl (ONE TOUCH ULTRA SYSTEM KIT) W/DEVICE KIT 1 kit by Does not apply route once. 250.02 1 each 0  . budesonide-formoterol (SYMBICORT) 80-4.5 MCG/ACT inhaler Inhale 2 puffs into the lungs 2 (two) times daily. 1 Inhaler 4  . clobetasol (TEMOVATE) 0.05 % ointment Use twice once daily to hand and feet as needed (Patient taking differently: Apply 1 application topically 2 (two) times daily. Use twice once daily to hand and feet as needed) 30 g 1  . glucose blood (ONETOUCH ULTRA) test strip Use to check blood sugars daily. Annual appt due in July must see provider for future refills 100 strip 0  . hydrochlorothiazide (HYDRODIURIL) 25 MG tablet TAKE 1 TABLET BY MOUTH EVERY DAY 90 tablet 1  . loratadine-pseudoephedrine (CLARITIN-D 24-HOUR) 10-240 MG per 24 hr tablet Take 1 tablet by mouth daily.    Marland Kitchen lovastatin (MEVACOR) 40 MG tablet TAKE 2 TABLETS BY MOUTH DAILY. ANNUAL APPT DUE IN JULY MUST SEE PROVIDER FOR FUTURE REFILLS 180 tablet 1  . metFORMIN (GLUCOPHAGE-XR) 500 MG 24 hr tablet TAKE 2 TABLETS BY MOUTH EVERY DAY 180 tablet 2  . methimazole (TAPAZOLE) 5 MG tablet TAKE 1 TABLET BY MOUTH EVERY DAY 90 tablet 0  . naproxen (NAPROSYN) 500 MG tablet TAKE 1 TABLET (500 MG TOTAL) BY MOUTH 2 (TWO) TIMES DAILY WITH A MEAL. 180 tablet 0  . omeprazole (PRILOSEC) 20 MG capsule TAKE 1 CAPSULE BY MOUTH EVERY DAY 90 capsule 1  . OneTouch Delica Lancets 57S MISC USE TO CHECK BLOOD SUGAR ONCE DAILY 100 each 1  . tiZANidine (ZANAFLEX) 4 MG tablet TAKE 1 TABLET (4 MG TOTAL) BY MOUTH EVERY 6 (SIX) HOURS AS NEEDED FOR MUSCLE SPASMS. 60 tablet 1  . traMADol (ULTRAM) 50 MG tablet TAKE 1 TABLET BY MOUTH EVERY 6 HOURS AS NEEDED FOR PAIN 120 tablet 5  . vitamin B-12 (CYANOCOBALAMIN) 1000 MCG tablet Take 1 tablet (1,000 mcg total) by mouth daily. 90 tablet 3  . Vitamin D,  Ergocalciferol, (DRISDOL) 1.25 MG (50000 UT) CAPS capsule Take 1 capsule (50,000 Units total) by mouth every 7 (seven) days. 12 capsule 0   No current facility-administered medications on file prior to visit.        ROS:  All others reviewed and negative.  Objective        PE:  BP 120/70 (BP Location: Left Arm, Patient Position: Sitting, Cuff Size: Large)   Pulse 67   Temp 98 F (36.7 C) (Oral)   Ht '5\' 3"'  (1.6 m)   Wt 113 lb (51.3 kg)   SpO2 95%   BMI 20.02 kg/m  Constitutional: Pt appears in NAD               HENT: Head: NCAT.                Right Ear: External ear normal.                 Left Ear: External ear normal.                Eyes: . Pupils are equal, round, and reactive to light. Conjunctivae and EOM are normal               Nose: without d/c or deformity               Neck: Neck supple. Gross normal ROM               Cardiovascular: Normal rate and regular rhythm.                 Pulmonary/Chest: Effort normal and breath sounds without rales or wheezing.                Abd:  Soft, NT, ND, + BS, no organomegaly               Neurological: Pt is alert. At baseline orientation, motor grossly intact               Skin: Skin is warm. No rashes, no other new lesions, LE edema - none               Psychiatric: Pt behavior is normal without agitation   Assessment/Plan:  JAYLYNE BREESE is a 75 y.o. Black or African American [2] female with  has a past medical history of ARTHRITIS (01/30/2008), ASTHMA (09/03/2007), Cancer ([place]) (2542), COLON CANCER (04/07/2008), COPD (09/03/2007), COPD (chronic obstructive pulmonary disease) ([place]) (04/22/2013), CORONARY ARTERY DISEASE (09/03/2007), DEGENERATIVE JOINT DISEASE (01/30/2008), DIABETES MELLITUS (01/30/2008), Diabetic peripheral neuropathy ([place]) (04/22/2013), Eczema, GERD (09/03/2007), GLUCOSE INTOLERANCE (09/03/2007), GOITER, MULTINODULAR (05/21/2010), History of blood transfusion, HYPERCHOLESTEROLEMIA (09/03/2007), HYPERTENSION  (09/03/2007), Impaired glucose tolerance (04/10/2011), LVH (left ventricular hypertrophy), OBESITY (09/03/2007), Osteopenia, Psoriasis, and Shortness of breath dyspnea.  Assessment Plan  See notes Labs reviewed for each problem: Lab Results  Component Value Date   WBC 4.1 12/17/2020   HGB 9.1 (L) 12/17/2020   HCT 28.9 (L) 12/17/2020   PLT 247.0 12/17/2020   GLUCOSE 91 12/09/2020   CHOL 127 12/09/2020   TRIG 80.0 12/09/2020   HDL 55.10 12/09/2020   LDLCALC 56 12/09/2020   ALT 17 12/09/2020   AST 22 12/09/2020   NA 136 12/09/2020   K 3.8 12/09/2020   CL 98 12/09/2020   CREATININE 0.85 12/09/2020   BUN 20 12/09/2020   CO2 31 12/09/2020   TSH 3.54 12/09/2020   INR 1.04 02/08/2016   HGBA1C 4.8 12/09/2020   MICROALBUR 1.8 12/09/2020    Micro: none  Cardiac tracings I have personally interpreted today:  none  Pertinent Radiological findings (summarize): none    There are no preventive care reminders to display for this patient.  There are no preventive care reminders to display for this patient.  Lab Results  Component Value Date   TSH 3.54 12/09/2020   Lab Results  Component Value Date   WBC 4.1 12/17/2020   HGB 9.1 (L) 12/17/2020   HCT 28.9 (L) 12/17/2020   MCV 66.5 Repeated and verified X2. (L) 12/17/2020   PLT 247.0 12/17/2020   Lab Results  Component Value  Date   NA 136 12/09/2020   K 3.8 12/09/2020   CO2 31 12/09/2020   GLUCOSE 91 12/09/2020   BUN 20 12/09/2020   CREATININE 0.85 12/09/2020   BILITOT 0.4 12/09/2020   ALKPHOS 60 12/09/2020   AST 22 12/09/2020   ALT 17 12/09/2020   PROT 6.6 12/09/2020   ALBUMIN 3.9 12/09/2020   CALCIUM 9.1 12/09/2020   ANIONGAP 11 02/08/2016   GFR 67.09 12/09/2020   Lab Results  Component Value Date   CHOL 127 12/09/2020   Lab Results  Component Value Date   HDL 55.10 12/09/2020   Lab Results  Component Value Date   LDLCALC 56 12/09/2020   Lab Results  Component Value Date   TRIG 80.0 12/09/2020   Lab  Results  Component Value Date   CHOLHDL 2 12/09/2020   Lab Results  Component Value Date   HGBA1C 4.8 12/09/2020      Assessment & Plan:   Problem List Items Addressed This Visit      High   Oral cancer [facility])    Original ENT cancer 2017 per [facility] - Dr Constance Holster      Maxillary sinus cancer [facility])    Recurrence of malignancy 2021 s/p prior tx oral cancer 2017, per [facility] s/p surgury and xrt 2021, to cont f/u [facility] and has f/u MRI scheduled feb 2022      Encounter for well adult exam with abnormal findings - Primary     Medium   Vitamin D deficiency    Last vitamin D Lab Results  Component Value Date   VD25OH 77.53 12/17/2020   Stable, cont oral replacement      HYPERCHOLESTEROLEMIA    Lab Results  Component Value Date   LDLCALC 56 12/09/2020   Stable, pt to continue current statin lovastatin 40       Essential hypertension    BP Readings from Last 3 Encounters:  12/17/20 120/70  12/17/19 120/60  06/26/19 124/80   Stable, pt to continue medical treatment amlodipine, hct, lopressor       Diabetes mellitus with neuropathy ([place])    Lab Results  Component Value Date   HGBA1C 4.8 12/09/2020   Stable, pt to continue current medical treatment metformin  Current Outpatient Medications (Endocrine & Metabolic):  .  metFORMIN (GLUCOPHAGE-XR) 500 MG 24 hr tablet, TAKE 2 TABLETS BY MOUTH EVERY DAY .  methimazole (TAPAZOLE) 5 MG tablet, TAKE 1 TABLET BY MOUTH EVERY DAY  Current Outpatient Medications (Cardiovascular):  .  amLODipine (NORVASC) 5 MG tablet, TAKE 1 TABLET (5 MG TOTAL) BY MOUTH DAILY. ANNUAL APPT IS DUE MUST SEE PROVIDER FOR FUTURE REFILLS .  hydrochlorothiazide (HYDRODIURIL) 25 MG tablet, TAKE 1 TABLET BY MOUTH EVERY DAY .  lovastatin (MEVACOR) 40 MG tablet, TAKE 2 TABLETS BY MOUTH DAILY. ANNUAL APPT DUE IN JULY MUST SEE PROVIDER FOR FUTURE REFILLS .  metoprolol tartrate (LOPRESSOR) 25 MG tablet, TAKE 1 & 1/2 TABLETS TWICE A DAY  Current Outpatient  Medications (Respiratory):  .  albuterol (PROVENTIL HFA;VENTOLIN HFA) 108 (90 Base) MCG/ACT inhaler, Inhale into the lungs. .  budesonide-formoterol (SYMBICORT) 80-4.5 MCG/ACT inhaler, Inhale 2 puffs into the lungs 2 (two) times daily. Marland Kitchen  loratadine-pseudoephedrine (CLARITIN-D 24-HOUR) 10-240 MG per 24 hr tablet, Take 1 tablet by mouth daily.  Current Outpatient Medications (Analgesics):  .  aspirin EC 81 MG tablet, Take 1 tablet (81 mg total) by mouth daily. .  naproxen (NAPROSYN) 500 MG tablet, TAKE 1 TABLET (500  MG TOTAL) BY MOUTH 2 (TWO) TIMES DAILY WITH A MEAL. Marland Kitchen  traMADol (ULTRAM) 50 MG tablet, TAKE 1 TABLET BY MOUTH EVERY 6 HOURS AS NEEDED FOR PAIN  Current Outpatient Medications (Hematological):  .  iron polysaccharides (NU-IRON) 150 MG capsule, Take 1 capsule (150 mg total) by mouth daily. .  vitamin B-12 (CYANOCOBALAMIN) 1000 MCG tablet, Take 1 tablet (1,000 mcg total) by mouth daily.  Current Outpatient Medications (Other):  .  acyclovir cream (ZOVIRAX) 5 %, Apply 1 application topically daily as needed. For psoriasis .  Blood Glucose Monitoring Suppl (ONE TOUCH ULTRA SYSTEM KIT) W/DEVICE KIT, 1 kit by Does not apply route once. 250.02 .  clobetasol (TEMOVATE) 0.05 % ointment, Use twice once daily to hand and feet as needed (Patient taking differently: Apply 1 application topically 2 (two) times daily. Use twice once daily to hand and feet as needed) .  glucose blood (ONETOUCH ULTRA) test strip, Use to check blood sugars daily. Annual appt due in July must see provider for future refills .  omeprazole (PRILOSEC) 20 MG capsule, TAKE 1 CAPSULE BY MOUTH EVERY DAY .  OneTouch Delica Lancets 93T MISC, USE TO CHECK BLOOD SUGAR ONCE DAILY .  tiZANidine (ZANAFLEX) 4 MG tablet, TAKE 1 TABLET (4 MG TOTAL) BY MOUTH EVERY 6 (SIX) HOURS AS NEEDED FOR MUSCLE SPASMS. .  Vitamin D, Ergocalciferol, (DRISDOL) 1.25 MG (50000 UT) CAPS capsule, Take 1 capsule (50,000 Units total) by mouth every 7 (seven)  days.       Bilateral hearing loss    Post ENT surgury for maxillary sinus cancer 2021 per [facility], now for audiology per [facility] but may not be able to have hearing aids due to small right ear canal      B12 deficiency    Lab Results  Component Value Date   VITAMINB12 >1526 (H) 12/17/2020   Stable, cont oral replacement - b12 1000 mcg qd      Asthma    Stable, cont current tx- albuterol and symbicort       Anemia    Recent worsening I suspect not related to acute blood loss post surgury, etiology o/w not clear, will need iron studies and refer GI if abnormal         No orders of the defined types were placed in this encounter.   Follow-up: Return in about 6 months (around 06/16/2021).     Cathlean Cower, MD 12/17/2020 12:17 PM [facility]

## 2020-12-23 DIAGNOSIS — Z923 Personal history of irradiation: Secondary | ICD-10-CM | POA: Diagnosis not present

## 2020-12-23 DIAGNOSIS — H6522 Chronic serous otitis media, left ear: Secondary | ICD-10-CM | POA: Diagnosis not present

## 2020-12-23 DIAGNOSIS — Z8522 Personal history of malignant neoplasm of nasal cavities, middle ear, and accessory sinuses: Secondary | ICD-10-CM | POA: Diagnosis not present

## 2020-12-23 DIAGNOSIS — H9192 Unspecified hearing loss, left ear: Secondary | ICD-10-CM | POA: Diagnosis not present

## 2020-12-23 DIAGNOSIS — H7291 Unspecified perforation of tympanic membrane, right ear: Secondary | ICD-10-CM | POA: Diagnosis not present

## 2020-12-23 DIAGNOSIS — C310 Malignant neoplasm of maxillary sinus: Secondary | ICD-10-CM | POA: Diagnosis not present

## 2020-12-23 DIAGNOSIS — R1312 Dysphagia, oropharyngeal phase: Secondary | ICD-10-CM | POA: Diagnosis not present

## 2020-12-24 ENCOUNTER — Other Ambulatory Visit: Payer: Self-pay | Admitting: Internal Medicine

## 2020-12-28 ENCOUNTER — Encounter: Payer: Self-pay | Admitting: Internal Medicine

## 2020-12-28 DIAGNOSIS — D649 Anemia, unspecified: Secondary | ICD-10-CM | POA: Insufficient documentation

## 2020-12-28 NOTE — Assessment & Plan Note (Signed)
Lab Results  Component Value Date   HGBA1C 4.8 12/09/2020   Stable, pt to continue current medical treatment metformin  Current Outpatient Medications (Endocrine & Metabolic):  .  metFORMIN (GLUCOPHAGE-XR) 500 MG 24 hr tablet, TAKE 2 TABLETS BY MOUTH EVERY DAY .  methimazole (TAPAZOLE) 5 MG tablet, TAKE 1 TABLET BY MOUTH EVERY DAY  Current Outpatient Medications (Cardiovascular):  .  amLODipine (NORVASC) 5 MG tablet, TAKE 1 TABLET (5 MG TOTAL) BY MOUTH DAILY. ANNUAL APPT IS DUE MUST SEE PROVIDER FOR FUTURE REFILLS .  hydrochlorothiazide (HYDRODIURIL) 25 MG tablet, TAKE 1 TABLET BY MOUTH EVERY DAY .  lovastatin (MEVACOR) 40 MG tablet, TAKE 2 TABLETS BY MOUTH DAILY. ANNUAL APPT DUE IN JULY MUST SEE PROVIDER FOR FUTURE REFILLS .  metoprolol tartrate (LOPRESSOR) 25 MG tablet, TAKE 1 & 1/2 TABLETS TWICE A DAY  Current Outpatient Medications (Respiratory):  .  albuterol (PROVENTIL HFA;VENTOLIN HFA) 108 (90 Base) MCG/ACT inhaler, Inhale into the lungs. .  budesonide-formoterol (SYMBICORT) 80-4.5 MCG/ACT inhaler, Inhale 2 puffs into the lungs 2 (two) times daily. Marland Kitchen  loratadine-pseudoephedrine (CLARITIN-D 24-HOUR) 10-240 MG per 24 hr tablet, Take 1 tablet by mouth daily.  Current Outpatient Medications (Analgesics):  .  aspirin EC 81 MG tablet, Take 1 tablet (81 mg total) by mouth daily. .  naproxen (NAPROSYN) 500 MG tablet, TAKE 1 TABLET (500 MG TOTAL) BY MOUTH 2 (TWO) TIMES DAILY WITH A MEAL. Marland Kitchen  traMADol (ULTRAM) 50 MG tablet, TAKE 1 TABLET BY MOUTH EVERY 6 HOURS AS NEEDED FOR PAIN  Current Outpatient Medications (Hematological):  .  iron polysaccharides (NU-IRON) 150 MG capsule, Take 1 capsule (150 mg total) by mouth daily. .  vitamin B-12 (CYANOCOBALAMIN) 1000 MCG tablet, Take 1 tablet (1,000 mcg total) by mouth daily.  Current Outpatient Medications (Other):  .  acyclovir cream (ZOVIRAX) 5 %, Apply 1 application topically daily as needed. For psoriasis .  Blood Glucose Monitoring Suppl  (ONE TOUCH ULTRA SYSTEM KIT) W/DEVICE KIT, 1 kit by Does not apply route once. 250.02 .  clobetasol (TEMOVATE) 0.05 % ointment, Use twice once daily to hand and feet as needed (Patient taking differently: Apply 1 application topically 2 (two) times daily. Use twice once daily to hand and feet as needed) .  glucose blood (ONETOUCH ULTRA) test strip, Use to check blood sugars daily. Annual appt due in July must see provider for future refills .  omeprazole (PRILOSEC) 20 MG capsule, TAKE 1 CAPSULE BY MOUTH EVERY DAY .  OneTouch Delica Lancets 26V MISC, USE TO CHECK BLOOD SUGAR ONCE DAILY .  tiZANidine (ZANAFLEX) 4 MG tablet, TAKE 1 TABLET (4 MG TOTAL) BY MOUTH EVERY 6 (SIX) HOURS AS NEEDED FOR MUSCLE SPASMS. .  Vitamin D, Ergocalciferol, (DRISDOL) 1.25 MG (50000 UT) CAPS capsule, Take 1 capsule (50,000 Units total) by mouth every 7 (seven) days.

## 2020-12-28 NOTE — Assessment & Plan Note (Addendum)
Recurrence of malignancy 2021 s/p prior tx oral cancer 2017, per [facility] s/p surgury and xrt 2021, to cont f/u [facility] and has f/u MRI scheduled feb 2022

## 2020-12-28 NOTE — Assessment & Plan Note (Signed)
Original ENT cancer 2017 per [facility] - Dr Constance Holster

## 2020-12-28 NOTE — Assessment & Plan Note (Signed)
Last vitamin D Lab Results  Component Value Date   VD25OH 77.53 12/17/2020   Stable, cont oral replacement

## 2020-12-28 NOTE — Assessment & Plan Note (Signed)
Recent worsening I suspect not related to acute blood loss post surgury, etiology o/w not clear, will need iron studies and refer GI if abnormal

## 2020-12-28 NOTE — Assessment & Plan Note (Signed)
Lab Results  Component Value Date   VITAMINB12 >1526 (H) 12/17/2020   Stable, cont oral replacement - b12 1000 mcg qd

## 2020-12-28 NOTE — Assessment & Plan Note (Signed)
Stable, cont current tx- albuterol and symbicort

## 2020-12-28 NOTE — Assessment & Plan Note (Signed)
Lab Results  Component Value Date   LDLCALC 56 12/09/2020   Stable, pt to continue current statin lovastatin 40

## 2020-12-28 NOTE — Assessment & Plan Note (Signed)
BP Readings from Last 3 Encounters:  12/17/20 120/70  12/17/19 120/60  06/26/19 124/80   Stable, pt to continue medical treatment amlodipine, hct, lopressor

## 2020-12-31 ENCOUNTER — Other Ambulatory Visit: Payer: Self-pay | Admitting: Internal Medicine

## 2020-12-31 NOTE — Telephone Encounter (Signed)
Please refill as per office routine med refill policy (all routine meds refilled for 3 mo or monthly per pt preference up to one year from last visit, then month to month grace period for 3 mo, then further med refills will have to be denied)  

## 2021-01-13 DIAGNOSIS — Z08 Encounter for follow-up examination after completed treatment for malignant neoplasm: Secondary | ICD-10-CM | POA: Diagnosis not present

## 2021-01-13 DIAGNOSIS — Z8522 Personal history of malignant neoplasm of nasal cavities, middle ear, and accessory sinuses: Secondary | ICD-10-CM | POA: Diagnosis not present

## 2021-01-13 DIAGNOSIS — Z923 Personal history of irradiation: Secondary | ICD-10-CM | POA: Diagnosis not present

## 2021-01-13 DIAGNOSIS — C310 Malignant neoplasm of maxillary sinus: Secondary | ICD-10-CM | POA: Diagnosis not present

## 2021-01-14 ENCOUNTER — Other Ambulatory Visit: Payer: Self-pay | Admitting: Internal Medicine

## 2021-01-14 DIAGNOSIS — C310 Malignant neoplasm of maxillary sinus: Secondary | ICD-10-CM

## 2021-01-19 ENCOUNTER — Other Ambulatory Visit: Payer: Self-pay | Admitting: Internal Medicine

## 2021-01-19 NOTE — Telephone Encounter (Signed)
Please refill as per office routine med refill policy (all routine meds refilled for 3 mo or monthly per pt preference up to one year from last visit, then month to month grace period for 3 mo, then further med refills will have to be denied)  

## 2021-01-26 ENCOUNTER — Other Ambulatory Visit: Payer: Self-pay | Admitting: Internal Medicine

## 2021-01-26 NOTE — Telephone Encounter (Signed)
Please refill as per office routine med refill policy (all routine meds refilled for 3 mo or monthly per pt preference up to one year from last visit, then month to month grace period for 3 mo, then further med refills will have to be denied)  

## 2021-01-28 ENCOUNTER — Other Ambulatory Visit: Payer: Self-pay | Admitting: Endocrinology

## 2021-02-01 ENCOUNTER — Other Ambulatory Visit: Payer: Self-pay | Admitting: Internal Medicine

## 2021-03-12 ENCOUNTER — Other Ambulatory Visit: Payer: Self-pay | Admitting: Internal Medicine

## 2021-03-12 NOTE — Telephone Encounter (Signed)
Please refill as per office routine med refill policy (all routine meds refilled for 3 mo or monthly per pt preference up to one year from last visit, then month to month grace period for 3 mo, then further med refills will have to be denied)  

## 2021-03-24 ENCOUNTER — Other Ambulatory Visit: Payer: Self-pay | Admitting: Internal Medicine

## 2021-04-01 ENCOUNTER — Other Ambulatory Visit: Payer: Self-pay | Admitting: Internal Medicine

## 2021-04-14 DIAGNOSIS — Z8522 Personal history of malignant neoplasm of nasal cavities, middle ear, and accessory sinuses: Secondary | ICD-10-CM | POA: Diagnosis not present

## 2021-04-14 DIAGNOSIS — Z923 Personal history of irradiation: Secondary | ICD-10-CM | POA: Diagnosis not present

## 2021-04-14 DIAGNOSIS — C310 Malignant neoplasm of maxillary sinus: Secondary | ICD-10-CM | POA: Diagnosis not present

## 2021-04-14 DIAGNOSIS — Z9889 Other specified postprocedural states: Secondary | ICD-10-CM | POA: Diagnosis not present

## 2021-04-14 DIAGNOSIS — Z08 Encounter for follow-up examination after completed treatment for malignant neoplasm: Secondary | ICD-10-CM | POA: Diagnosis not present

## 2021-04-14 DIAGNOSIS — H6691 Otitis media, unspecified, right ear: Secondary | ICD-10-CM | POA: Diagnosis not present

## 2021-04-14 DIAGNOSIS — R1312 Dysphagia, oropharyngeal phase: Secondary | ICD-10-CM | POA: Diagnosis not present

## 2021-04-14 DIAGNOSIS — J329 Chronic sinusitis, unspecified: Secondary | ICD-10-CM | POA: Diagnosis not present

## 2021-04-17 ENCOUNTER — Other Ambulatory Visit: Payer: Self-pay | Admitting: Internal Medicine

## 2021-04-17 NOTE — Telephone Encounter (Signed)
Please refill as per office routine med refill policy (all routine meds refilled for 3 mo or monthly per pt preference up to one year from last visit, then month to month grace period for 3 mo, then further med refills will have to be denied)  

## 2021-04-26 ENCOUNTER — Other Ambulatory Visit: Payer: Self-pay | Admitting: Internal Medicine

## 2021-04-26 NOTE — Telephone Encounter (Signed)
Please refill as per office routine med refill policy (all routine meds refilled for 3 mo or monthly per pt preference up to one year from last visit, then month to month grace period for 3 mo, then further med refills will have to be denied)  

## 2021-04-27 ENCOUNTER — Other Ambulatory Visit: Payer: Self-pay | Admitting: Internal Medicine

## 2021-04-27 MED ORDER — TIZANIDINE HCL 4 MG PO TABS: 4.0000 mg | ORAL_TABLET | Freq: Four times a day (QID) | ORAL | 1 refills | Status: DC | PRN

## 2021-05-05 DIAGNOSIS — C760 Malignant neoplasm of head, face and neck: Secondary | ICD-10-CM | POA: Diagnosis not present

## 2021-05-05 DIAGNOSIS — C310 Malignant neoplasm of maxillary sinus: Secondary | ICD-10-CM | POA: Diagnosis not present

## 2021-05-05 DIAGNOSIS — Z08 Encounter for follow-up examination after completed treatment for malignant neoplasm: Secondary | ICD-10-CM | POA: Diagnosis not present

## 2021-05-05 DIAGNOSIS — H7011 Chronic mastoiditis, right ear: Secondary | ICD-10-CM | POA: Diagnosis not present

## 2021-05-05 DIAGNOSIS — H6691 Otitis media, unspecified, right ear: Secondary | ICD-10-CM | POA: Diagnosis not present

## 2021-05-05 DIAGNOSIS — H9211 Otorrhea, right ear: Secondary | ICD-10-CM | POA: Diagnosis not present

## 2021-05-05 DIAGNOSIS — Z8522 Personal history of malignant neoplasm of nasal cavities, middle ear, and accessory sinuses: Secondary | ICD-10-CM | POA: Diagnosis not present

## 2021-05-05 DIAGNOSIS — H7090 Unspecified mastoiditis, unspecified ear: Secondary | ICD-10-CM | POA: Diagnosis not present

## 2021-05-06 ENCOUNTER — Other Ambulatory Visit: Payer: Self-pay | Admitting: Internal Medicine

## 2021-05-19 ENCOUNTER — Other Ambulatory Visit: Payer: Self-pay | Admitting: Internal Medicine

## 2021-05-26 DIAGNOSIS — H7011 Chronic mastoiditis, right ear: Secondary | ICD-10-CM | POA: Diagnosis not present

## 2021-05-26 DIAGNOSIS — H7291 Unspecified perforation of tympanic membrane, right ear: Secondary | ICD-10-CM | POA: Diagnosis not present

## 2021-05-26 DIAGNOSIS — C310 Malignant neoplasm of maxillary sinus: Secondary | ICD-10-CM | POA: Diagnosis not present

## 2021-05-27 ENCOUNTER — Other Ambulatory Visit: Payer: Self-pay | Admitting: Internal Medicine

## 2021-05-28 DIAGNOSIS — C760 Malignant neoplasm of head, face and neck: Secondary | ICD-10-CM | POA: Diagnosis not present

## 2021-05-28 DIAGNOSIS — Z01812 Encounter for preprocedural laboratory examination: Secondary | ICD-10-CM | POA: Diagnosis not present

## 2021-05-28 DIAGNOSIS — D509 Iron deficiency anemia, unspecified: Secondary | ICD-10-CM | POA: Diagnosis not present

## 2021-05-28 DIAGNOSIS — Z01810 Encounter for preprocedural cardiovascular examination: Secondary | ICD-10-CM | POA: Diagnosis not present

## 2021-05-28 DIAGNOSIS — I491 Atrial premature depolarization: Secondary | ICD-10-CM | POA: Diagnosis not present

## 2021-05-28 DIAGNOSIS — R7989 Other specified abnormal findings of blood chemistry: Secondary | ICD-10-CM | POA: Diagnosis not present

## 2021-05-28 DIAGNOSIS — Z7951 Long term (current) use of inhaled steroids: Secondary | ICD-10-CM | POA: Diagnosis not present

## 2021-05-28 DIAGNOSIS — C310 Malignant neoplasm of maxillary sinus: Secondary | ICD-10-CM | POA: Diagnosis not present

## 2021-05-28 DIAGNOSIS — J4520 Mild intermittent asthma, uncomplicated: Secondary | ICD-10-CM | POA: Diagnosis not present

## 2021-05-28 DIAGNOSIS — I517 Cardiomegaly: Secondary | ICD-10-CM | POA: Diagnosis not present

## 2021-06-01 ENCOUNTER — Other Ambulatory Visit: Payer: Self-pay | Admitting: Internal Medicine

## 2021-06-01 DIAGNOSIS — Z87891 Personal history of nicotine dependence: Secondary | ICD-10-CM | POA: Diagnosis not present

## 2021-06-01 DIAGNOSIS — Z923 Personal history of irradiation: Secondary | ICD-10-CM | POA: Diagnosis not present

## 2021-06-01 DIAGNOSIS — Z7984 Long term (current) use of oral hypoglycemic drugs: Secondary | ICD-10-CM | POA: Diagnosis not present

## 2021-06-01 DIAGNOSIS — R54 Age-related physical debility: Secondary | ICD-10-CM | POA: Diagnosis not present

## 2021-06-01 DIAGNOSIS — Z79899 Other long term (current) drug therapy: Secondary | ICD-10-CM | POA: Diagnosis not present

## 2021-06-01 DIAGNOSIS — J45909 Unspecified asthma, uncomplicated: Secondary | ICD-10-CM | POA: Diagnosis not present

## 2021-06-01 DIAGNOSIS — K219 Gastro-esophageal reflux disease without esophagitis: Secondary | ICD-10-CM | POA: Diagnosis not present

## 2021-06-01 DIAGNOSIS — H7011 Chronic mastoiditis, right ear: Secondary | ICD-10-CM | POA: Diagnosis not present

## 2021-06-01 DIAGNOSIS — I491 Atrial premature depolarization: Secondary | ICD-10-CM | POA: Diagnosis not present

## 2021-06-01 DIAGNOSIS — E119 Type 2 diabetes mellitus without complications: Secondary | ICD-10-CM | POA: Diagnosis not present

## 2021-06-01 DIAGNOSIS — E049 Nontoxic goiter, unspecified: Secondary | ICD-10-CM | POA: Diagnosis not present

## 2021-06-01 DIAGNOSIS — I10 Essential (primary) hypertension: Secondary | ICD-10-CM | POA: Diagnosis not present

## 2021-06-01 DIAGNOSIS — H9211 Otorrhea, right ear: Secondary | ICD-10-CM | POA: Diagnosis not present

## 2021-06-02 ENCOUNTER — Other Ambulatory Visit: Payer: Self-pay | Admitting: Endocrinology

## 2021-06-04 ENCOUNTER — Telehealth: Payer: Self-pay | Admitting: Internal Medicine

## 2021-06-04 NOTE — Telephone Encounter (Signed)
Patient calling to follow up on the refill request for Methimazole. Scheduled for a follow up on 08/24/21.

## 2021-06-04 NOTE — Telephone Encounter (Signed)
   Patient called and said that she had surgery on Tuesday and they drew blood and said they would be faxing the results to Dr. Jenny Reichmann from [facility]. Patient was wondering if results had been received. Please advise

## 2021-06-09 ENCOUNTER — Other Ambulatory Visit: Payer: Self-pay | Admitting: Internal Medicine

## 2021-06-09 NOTE — Telephone Encounter (Signed)
Please refill as per office routine med refill policy (all routine meds refilled for 3 mo or monthly per pt preference up to one year from last visit, then month to month grace period for 3 mo, then further med refills will have to be denied)  

## 2021-06-16 DIAGNOSIS — Z483 Aftercare following surgery for neoplasm: Secondary | ICD-10-CM | POA: Diagnosis not present

## 2021-06-16 DIAGNOSIS — C310 Malignant neoplasm of maxillary sinus: Secondary | ICD-10-CM | POA: Diagnosis not present

## 2021-06-23 ENCOUNTER — Encounter: Payer: Self-pay | Admitting: Internal Medicine

## 2021-06-23 ENCOUNTER — Ambulatory Visit (INDEPENDENT_AMBULATORY_CARE_PROVIDER_SITE_OTHER): Payer: Medicare Other | Admitting: Internal Medicine

## 2021-06-23 ENCOUNTER — Other Ambulatory Visit: Payer: Self-pay

## 2021-06-23 VITALS — BP 120/66 | HR 63 | Temp 98.2°F | Ht 63.0 in | Wt 106.2 lb

## 2021-06-23 DIAGNOSIS — E559 Vitamin D deficiency, unspecified: Secondary | ICD-10-CM

## 2021-06-23 DIAGNOSIS — J45909 Unspecified asthma, uncomplicated: Secondary | ICD-10-CM | POA: Diagnosis not present

## 2021-06-23 DIAGNOSIS — E7800 Pure hypercholesterolemia, unspecified: Secondary | ICD-10-CM | POA: Diagnosis not present

## 2021-06-23 DIAGNOSIS — E1140 Type 2 diabetes mellitus with diabetic neuropathy, unspecified: Secondary | ICD-10-CM

## 2021-06-23 DIAGNOSIS — E538 Deficiency of other specified B group vitamins: Secondary | ICD-10-CM

## 2021-06-23 DIAGNOSIS — I10 Essential (primary) hypertension: Secondary | ICD-10-CM | POA: Diagnosis not present

## 2021-06-23 DIAGNOSIS — I700 Atherosclerosis of aorta: Secondary | ICD-10-CM | POA: Diagnosis not present

## 2021-06-23 NOTE — Progress Notes (Signed)
Patient ID: [name] [name], female   DOB: [birth year], 75 y.o.   MRN: [id number]        Chief Complaint: follow up aortic atherosclerosis,        HPI:  [name] [name] is a 75 y.o. female here overall doing ok, taking Vit D.   Finished antibx after recent surgury.except for ear drops.  Conts to lose wt but hard to gain with recent infection and surgury.  Feels liek she is now starating to get back appetite. Next MRI due early august.  Pt denies chest pain, increased sob or doe, wheezing, orthopnea, PND, increased LE swelling, palpitations, dizziness or syncope.   Pt denies polydipsia, polyuria, or new focal neuro s/s.   Wt Readings from Last 3 Encounters:  06/23/21 106 lb 3.2 oz (48.2 kg)  12/17/20 113 lb (51.3 kg)  12/17/19 115 lb (52.2 kg)   BP Readings from Last 3 Encounters:  06/23/21 120/66  12/17/20 120/70  12/17/19 120/60         Past Medical History:  Diagnosis Date   ARTHRITIS 01/30/2008   ASTHMA 09/03/2007   Cancer ([place]) 1987   palate cancer   COLON CANCER 04/07/2008   pt states she had colon cancer 1987   COPD 09/03/2007   COPD (chronic obstructive pulmonary disease) ([place]) 04/22/2013   CORONARY ARTERY DISEASE 09/03/2007   DEGENERATIVE JOINT DISEASE 01/30/2008   DIABETES MELLITUS 01/30/2008   Diabetic peripheral neuropathy ([place]) 04/22/2013   Eczema    GERD 09/03/2007   GLUCOSE INTOLERANCE 09/03/2007   GOITER, MULTINODULAR 05/21/2010   History of blood transfusion    HYPERCHOLESTEROLEMIA 09/03/2007   HYPERTENSION 09/03/2007   Impaired glucose tolerance 04/10/2011   LVH (left ventricular hypertrophy)    OBESITY 09/03/2007   Osteopenia    Psoriasis    Shortness of breath dyspnea    With exertion and allergies to pollen   Past Surgical History:  Procedure Laterality Date   ABDOMINAL HYSTERECTOMY     CARDIAC CATHETERIZATION     COLON SURGERY     FLOOR OF MOUTH BIOPSY N/A 12/24/2015   Procedure: ORAL BIOPSY;  Surgeon: Izora Gala, MD;  Location: [facility] OR;  Service: ENT;  Laterality:  N/A;   HEMICOLECTOMY  12/1985   cancer   HERNIA REPAIR     MAXILLECTOMY Right 01/22/2016   Procedure: RIGHT SIDE PARTIAL MAXILLECTOMY;  Surgeon: Izora Gala, MD;  Location: [place];  Service: ENT;  Laterality: Right;   TUBAL LIGATION      reports that she quit smoking about 42 years ago. Her smoking use included cigarettes. She has a 15.00 pack-year smoking history. She has never used smokeless tobacco. She reports that she does not drink alcohol and does not use drugs. family history includes Cancer in her sister and another family member; Colon cancer in her paternal aunt and another family member; Heart disease in her mother; Liver disease in her father; Thyroid disease in her brother and sister. Allergies  Allergen Reactions   Simvastatin Other (See Comments)    Muscle spasms   Current Outpatient Medications on File Prior to Visit  Medication Sig Dispense Refill   acyclovir cream (ZOVIRAX) 5 % Apply 1 application topically daily as needed. For psoriasis     albuterol (PROVENTIL HFA;VENTOLIN HFA) 108 (90 Base) MCG/ACT inhaler Inhale into the lungs.     amLODipine (NORVASC) 5 MG tablet Take 1 tablet (5 mg total) by mouth daily. 90 tablet 0   aspirin EC 81 MG tablet Take 1  tablet (81 mg total) by mouth daily. 90 tablet 11   Blood Glucose Monitoring Suppl (ONE TOUCH ULTRA SYSTEM KIT) W/DEVICE KIT 1 kit by Does not apply route once. 250.02 1 each 0   budesonide-formoterol (SYMBICORT) 80-4.5 MCG/ACT inhaler Inhale 2 puffs into the lungs 2 (two) times daily. 1 Inhaler 4   clobetasol (TEMOVATE) 0.05 % ointment Use twice once daily to hand and feet as needed (Patient taking differently: Apply 1 application topically 2 (two) times daily. Use twice once daily to hand and feet as needed) 30 g 1   hydrochlorothiazide (HYDRODIURIL) 25 MG tablet TAKE 1 TABLET BY MOUTH EVERY DAY 90 tablet 1   iron polysaccharides (NU-IRON) 150 MG capsule Take 1 capsule (150 mg total) by mouth daily. 90 capsule 1   Lancets  (ONETOUCH DELICA PLUS [id number]) MISC USE TO CHECK BLOOD SUGAR ONCE DAILY 100 each 1   loratadine-pseudoephedrine (CLARITIN-D 24-HOUR) 10-240 MG per 24 hr tablet Take 1 tablet by mouth daily.     lovastatin (MEVACOR) 40 MG tablet TAKE 2 TABLETS BY MOUTH DAILY. ANNUAL APPT DUE IN JULY MUST SEE PROVIDER FOR FUTURE REFILLS 180 tablet 1   metFORMIN (GLUCOPHAGE-XR) 500 MG 24 hr tablet TAKE 2 TABLETS BY MOUTH EVERY DAY 180 tablet 2   methimazole (TAPAZOLE) 5 MG tablet TAKE 1 TABLET BY MOUTH EVERY DAY 90 tablet 0   metoprolol tartrate (LOPRESSOR) 25 MG tablet TAKE 1 & 1/2 TABLETS TWICE A DAY 270 tablet 1   naproxen (NAPROSYN) 500 MG tablet TAKE 1 TABLET (500 MG TOTAL) BY MOUTH 2 (TWO) TIMES DAILY WITH A MEAL. 180 tablet 0   omeprazole (PRILOSEC) 20 MG capsule TAKE 1 CAPSULE BY MOUTH EVERY DAY 90 capsule 1   omeprazole (PRILOSEC) 20 MG capsule Take by mouth.     ONETOUCH ULTRA test strip USE TO CHECK BLOOD SUGARS DAILY. ANNUAL APPT DUE IN JULY MUST SEE PROVIDER FOR FUTURE REFILLS 100 strip 0   tiZANidine (ZANAFLEX) 4 MG tablet TAKE 1 TABLET BY MOUTH EVERY 6 HOURS AS NEEDED FOR MUSCLE SPASMS. 360 tablet 1   traMADol (ULTRAM) 50 MG tablet TAKE 1 TABLET BY MOUTH EVERY 6 HOURS AS NEEDED FOR PAIN 120 tablet 5   vitamin B-12 (CYANOCOBALAMIN) 1000 MCG tablet Take 1 tablet (1,000 mcg total) by mouth daily. 90 tablet 3   Vitamin D, Ergocalciferol, (DRISDOL) 1.25 MG (50000 UT) CAPS capsule Take 1 capsule (50,000 Units total) by mouth every 7 (seven) days. 12 capsule 0   ofloxacin (FLOXIN) 0.3 % OTIC solution Place into the right ear.     No current facility-administered medications on file prior to visit.        ROS:  All others reviewed and negative.  Objective        PE:  BP 120/66 (BP Location: Left Arm, Patient Position: Sitting, Cuff Size: Normal)   Pulse 63   Temp 98.2 F (36.8 C) (Oral)   Ht _0  (1.6 m)   Wt 106 lb 3.2 oz (48.2 kg)   SpO2 99%   BMI 18.81 kg/m                 Constitutional: Pt  appears in NAD               HENT: Head: NCAT.                Right Ear: External ear normal.  Left Ear: External ear normal.                Eyes: . Pupils are equal, round, and reactive to light. Conjunctivae and EOM are normal               Nose: without d/c or deformity               Neck: Neck supple. Gross normal ROM               Cardiovascular: Normal rate and regular rhythm.                 Pulmonary/Chest: Effort normal and breath sounds without rales or wheezing.                Abd:  Soft, NT, ND, + BS, no organomegaly               Neurological: Pt is alert. At baseline orientation, motor grossly intact               Skin: Skin is warm. No rashes, no other new lesions, LE edema - none               Psychiatric: Pt behavior is normal without agitation   Micro: none  Cardiac tracings I have personally interpreted today:  none  Pertinent Radiological findings (summarize): none   Lab Results  Component Value Date   WBC 4.1 12/17/2020   HGB 9.1 (L) 12/17/2020   HCT 28.9 (L) 12/17/2020   PLT 247.0 12/17/2020   GLUCOSE 91 12/09/2020   CHOL 127 12/09/2020   TRIG 80.0 12/09/2020   HDL 55.10 12/09/2020   LDLCALC 56 12/09/2020   ALT 17 12/09/2020   AST 22 12/09/2020   NA 136 12/09/2020   K 3.8 12/09/2020   CL 98 12/09/2020   CREATININE 0.85 12/09/2020   BUN 20 12/09/2020   CO2 31 12/09/2020   TSH 3.54 12/09/2020   INR 1.04 02/08/2016   HGBA1C 4.8 12/09/2020   MICROALBUR 1.8 12/09/2020   Assessment/Plan:  [name] [name] is a 75 y.o. Black or African American [2] female with  has a past medical history of ARTHRITIS (01/30/2008), ASTHMA (09/03/2007), Cancer ([place]) (6063), COLON CANCER (04/07/2008), COPD (09/03/2007), COPD (chronic obstructive pulmonary disease) ([place]) (04/22/2013), CORONARY ARTERY DISEASE (09/03/2007), DEGENERATIVE JOINT DISEASE (01/30/2008), DIABETES MELLITUS (01/30/2008), Diabetic peripheral neuropathy ([place]) (04/22/2013), Eczema, GERD (09/03/2007),  GLUCOSE INTOLERANCE (09/03/2007), GOITER, MULTINODULAR (05/21/2010), History of blood transfusion, HYPERCHOLESTEROLEMIA (09/03/2007), HYPERTENSION (09/03/2007), Impaired glucose tolerance (04/10/2011), LVH (left ventricular hypertrophy), OBESITY (09/03/2007), Osteopenia, Psoriasis, and Shortness of breath dyspnea.  Vitamin D deficiency Last vitamin D Lab Results  Component Value Date   VD25OH 77.53 12/17/2020   Stable, cont oral replacement  HYPERCHOLESTEROLEMIA Lab Results  Component Value Date   LDLCALC 56 12/09/2020   Stable, pt to continue current statin lovastatin   Essential hypertension BP Readings from Last 3 Encounters:  06/23/21 120/66  12/17/20 120/70  12/17/19 120/60   Stable, pt to continue medical treatment norvasc, hct   Diabetes mellitus with neuropathy Lab Results  Component Value Date   HGBA1C 4.8 12/09/2020   Stable, pt to continue current medical treatment - metformin   Aortic atherosclerosis (HCC) Overall stable , to continue low chol diet, exercise and lovastatin  Followup: Return in about 6 months (around 12/24/2021).  Cathlean Cower, MD 06/27/2021 7:01 PM [facility]

## 2021-06-23 NOTE — Patient Instructions (Signed)
Please continue all other medications as before, and refills have been done if requested.  Please have the pharmacy call with any other refills you may need.  Please keep your appointments with your specialists as you may have planned  Please make an Appointment to return in 6 months, or sooner if needed, also with Lab Appointment for testing done 3-5 days before at the [place] (so this is for TWO appointments - please see the scheduling desk as you leave)  Due to the ongoing Covid 19 pandemic, our lab now requires an appointment for any labs done at our office.  If you need labs done and do not have an appointment, please call our office ahead of time to schedule before presenting to the lab for your testing.

## 2021-06-27 ENCOUNTER — Encounter: Payer: Self-pay | Admitting: Internal Medicine

## 2021-06-27 NOTE — Assessment & Plan Note (Signed)
Overall stable , to continue low chol diet, exercise and lovastatin

## 2021-06-27 NOTE — Assessment & Plan Note (Signed)
BP Readings from Last 3 Encounters:  06/23/21 120/66  12/17/20 120/70  12/17/19 120/60   Stable, pt to continue medical treatment norvasc, hct

## 2021-06-27 NOTE — Assessment & Plan Note (Signed)
Lab Results  Component Value Date   HGBA1C 4.8 12/09/2020   Stable, pt to continue current medical treatment - metformin

## 2021-06-27 NOTE — Assessment & Plan Note (Signed)
Lab Results  Component Value Date   LDLCALC 56 12/09/2020   Stable, pt to continue current statin lovastatin

## 2021-06-27 NOTE — Addendum Note (Signed)
Addended by: Biagio Borg on: 06/27/2021 07:09 PM   Modules accepted: Orders

## 2021-06-27 NOTE — Assessment & Plan Note (Signed)
Last vitamin D Lab Results  Component Value Date   VD25OH 77.53 12/17/2020   Stable, cont oral replacement

## 2021-07-05 ENCOUNTER — Other Ambulatory Visit: Payer: Self-pay | Admitting: Internal Medicine

## 2021-07-15 DIAGNOSIS — C310 Malignant neoplasm of maxillary sinus: Secondary | ICD-10-CM | POA: Diagnosis not present

## 2021-07-15 DIAGNOSIS — Z9889 Other specified postprocedural states: Secondary | ICD-10-CM | POA: Diagnosis not present

## 2021-07-22 DIAGNOSIS — C310 Malignant neoplasm of maxillary sinus: Secondary | ICD-10-CM | POA: Diagnosis not present

## 2021-07-22 DIAGNOSIS — I700 Atherosclerosis of aorta: Secondary | ICD-10-CM | POA: Diagnosis not present

## 2021-07-22 DIAGNOSIS — R911 Solitary pulmonary nodule: Secondary | ICD-10-CM | POA: Diagnosis not present

## 2021-07-22 DIAGNOSIS — R918 Other nonspecific abnormal finding of lung field: Secondary | ICD-10-CM | POA: Diagnosis not present

## 2021-07-28 DIAGNOSIS — J3489 Other specified disorders of nose and nasal sinuses: Secondary | ICD-10-CM | POA: Diagnosis not present

## 2021-07-28 DIAGNOSIS — Z8522 Personal history of malignant neoplasm of nasal cavities, middle ear, and accessory sinuses: Secondary | ICD-10-CM | POA: Diagnosis not present

## 2021-07-28 DIAGNOSIS — H938X2 Other specified disorders of left ear: Secondary | ICD-10-CM | POA: Diagnosis not present

## 2021-07-28 DIAGNOSIS — H9211 Otorrhea, right ear: Secondary | ICD-10-CM | POA: Diagnosis not present

## 2021-07-28 DIAGNOSIS — C310 Malignant neoplasm of maxillary sinus: Secondary | ICD-10-CM | POA: Diagnosis not present

## 2021-07-28 DIAGNOSIS — Z923 Personal history of irradiation: Secondary | ICD-10-CM | POA: Diagnosis not present

## 2021-07-28 DIAGNOSIS — Z9889 Other specified postprocedural states: Secondary | ICD-10-CM | POA: Diagnosis not present

## 2021-08-15 ENCOUNTER — Other Ambulatory Visit: Payer: Self-pay | Admitting: Internal Medicine

## 2021-08-15 NOTE — Telephone Encounter (Signed)
Please refill as per office routine med refill policy (all routine meds to be refilled for 3 mo or monthly (per pt preference) up to one year from last visit, then month to month grace period for 3 mo, then further med refills will have to be denied) ? ?

## 2021-08-24 ENCOUNTER — Ambulatory Visit (INDEPENDENT_AMBULATORY_CARE_PROVIDER_SITE_OTHER): Payer: Medicare Other | Admitting: Endocrinology

## 2021-08-24 ENCOUNTER — Other Ambulatory Visit: Payer: Self-pay

## 2021-08-24 VITALS — BP 136/64 | HR 72 | Ht 63.0 in | Wt 105.6 lb

## 2021-08-24 DIAGNOSIS — E0590 Thyrotoxicosis, unspecified without thyrotoxic crisis or storm: Secondary | ICD-10-CM | POA: Diagnosis not present

## 2021-08-24 LAB — TSH: TSH: 3.95 u[IU]/mL (ref 0.35–5.50)

## 2021-08-24 LAB — T4, FREE: Free T4: 0.79 ng/dL (ref 0.60–1.60)

## 2021-08-24 NOTE — Progress Notes (Signed)
Subjective:    Patient ID: [name] [name], female    DOB: Aug 03, 1946, 75 y.o.   MRN: [id number]  HPI Pt returns for f/u of a small multinodular goiter and hyperthyroidism (dx'ed 2011; nodules have not met criteria for bx; f/u ultrasound in 2014 was unchanged; in 2021, CT showed thyroid nodule, no f/u needed; in 2016, TSH was more suppressed; she declined RAI, so she was then rx'ed tapazole; she had surg and XRT for oral CA in 2021).  she feels well in general.  She takes tapazole 5 mg QD, as rx'ed.  Denies neck pain and swelling.   Past Medical History:  Diagnosis Date   ARTHRITIS 01/30/2008   ASTHMA 09/03/2007   Cancer ([place]) 1987   palate cancer   COLON CANCER 04/07/2008   pt states she had colon cancer 1987   COPD 09/03/2007   COPD (chronic obstructive pulmonary disease) ([place]) 04/22/2013   CORONARY ARTERY DISEASE 09/03/2007   DEGENERATIVE JOINT DISEASE 01/30/2008   DIABETES MELLITUS 01/30/2008   Diabetic peripheral neuropathy ([place]) 04/22/2013   Eczema    GERD 09/03/2007   GLUCOSE INTOLERANCE 09/03/2007   GOITER, MULTINODULAR 05/21/2010   History of blood transfusion    HYPERCHOLESTEROLEMIA 09/03/2007   HYPERTENSION 09/03/2007   Impaired glucose tolerance 04/10/2011   LVH (left ventricular hypertrophy)    OBESITY 09/03/2007   Osteopenia    Psoriasis    Shortness of breath dyspnea    With exertion and allergies to pollen    Past Surgical History:  Procedure Laterality Date   ABDOMINAL HYSTERECTOMY     CARDIAC CATHETERIZATION     COLON SURGERY     FLOOR OF MOUTH BIOPSY N/A 12/24/2015   Procedure: ORAL BIOPSY;  Surgeon: Izora Gala, MD;  Location: [facility] OR;  Service: ENT;  Laterality: N/A;   HEMICOLECTOMY  12/1985   cancer   HERNIA REPAIR     MAXILLECTOMY Right 01/22/2016   Procedure: RIGHT SIDE PARTIAL MAXILLECTOMY;  Surgeon: Izora Gala, MD;  Location: [facility] OR;  Service: ENT;  Laterality: Right;   TUBAL LIGATION      Social History   Socioeconomic History   Marital status: Married     Spouse name: Not on file   Number of children: 4   Years of education: Not on file   Highest education level: Not on file  Occupational History   Occupation: stay at home    Employer: UNEMPLOYED  Tobacco Use   Smoking status: Former    Packs/day: 1.00    Years: 15.00    Pack years: 15.00    Types: Cigarettes    Quit date: 12/12/1978    Years since quitting: 42.7   Smokeless tobacco: Never  Substance and Sexual Activity   Alcohol use: No   Drug use: No   Sexual activity: Not on file  Other Topics Concern   Not on file  Social History Narrative   1 child died with meningitis   Social Determinants of Health   Financial Resource Strain: Not on file  Food Insecurity: Not on file  Transportation Needs: Not on file  Physical Activity: Not on file  Stress: Not on file  Social Connections: Not on file  Intimate Partner Violence: Not on file    Current Outpatient Medications on File Prior to Visit  Medication Sig Dispense Refill   acyclovir cream (ZOVIRAX) 5 % Apply 1 application topically daily as needed. For psoriasis     albuterol (PROVENTIL HFA;VENTOLIN HFA) 108 (90 Base) MCG/ACT inhaler  Inhale into the lungs.     amLODipine (NORVASC) 5 MG tablet Take 1 tablet (5 mg total) by mouth daily. 90 tablet 0   aspirin EC 81 MG tablet Take 1 tablet (81 mg total) by mouth daily. 90 tablet 11   Blood Glucose Monitoring Suppl (ONE TOUCH ULTRA SYSTEM KIT) W/DEVICE KIT 1 kit by Does not apply route once. 250.02 1 each 0   budesonide-formoterol (SYMBICORT) 80-4.5 MCG/ACT inhaler Inhale 2 puffs into the lungs 2 (two) times daily. 1 Inhaler 4   clobetasol (TEMOVATE) 0.05 % ointment Use twice once daily to hand and feet as needed (Patient taking differently: Apply 1 application topically 2 (two) times daily. Use twice once daily to hand and feet as needed) 30 g 1   hydrochlorothiazide (HYDRODIURIL) 25 MG tablet TAKE 1 TABLET BY MOUTH EVERY DAY 90 tablet 1   iron polysaccharides (NU-IRON) 150 MG  capsule Take 1 capsule (150 mg total) by mouth daily. 90 capsule 1   Lancets (ONETOUCH DELICA PLUS [id number]) MISC USE TO CHECK BLOOD SUGAR ONCE DAILY 100 each 1   loratadine-pseudoephedrine (CLARITIN-D 24-HOUR) 10-240 MG per 24 hr tablet Take 1 tablet by mouth daily.     lovastatin (MEVACOR) 40 MG tablet TAKE 2 TABLETS BY MOUTH DAILY. ANNUAL APPT DUE IN JULY MUST SEE PROVIDER FOR FUTURE REFILLS 180 tablet 1   metFORMIN (GLUCOPHAGE-XR) 500 MG 24 hr tablet TAKE 2 TABLETS BY MOUTH EVERY DAY 180 tablet 2   methimazole (TAPAZOLE) 5 MG tablet TAKE 1 TABLET BY MOUTH EVERY DAY 90 tablet 0   metoprolol tartrate (LOPRESSOR) 25 MG tablet TAKE 1 & 1/2 TABLETS TWICE A DAY 270 tablet 1   naproxen (NAPROSYN) 500 MG tablet TAKE 1 TABLET (500 MG TOTAL) BY MOUTH 2 (TWO) TIMES DAILY WITH A MEAL. 180 tablet 0   ofloxacin (FLOXIN) 0.3 % OTIC solution Place into the right ear.     omeprazole (PRILOSEC) 20 MG capsule Take by mouth.     omeprazole (PRILOSEC) 20 MG capsule TAKE 1 CAPSULE BY MOUTH EVERY DAY 90 capsule 1   ONETOUCH ULTRA test strip USE TO CHECK BLOOD SUGARS DAILY. ANNUAL APPT DUE IN JULY MUST SEE PROVIDER FOR FUTURE REFILLS 100 strip 0   tiZANidine (ZANAFLEX) 4 MG tablet TAKE 1 TABLET BY MOUTH EVERY 6 HOURS AS NEEDED FOR MUSCLE SPASMS. 360 tablet 1   traMADol (ULTRAM) 50 MG tablet TAKE 1 TABLET BY MOUTH EVERY 6 HOURS AS NEEDED FOR PAIN 120 tablet 5   vitamin B-12 (CYANOCOBALAMIN) 1000 MCG tablet Take 1 tablet (1,000 mcg total) by mouth daily. 90 tablet 3   Vitamin D, Ergocalciferol, (DRISDOL) 1.25 MG (50000 UT) CAPS capsule Take 1 capsule (50,000 Units total) by mouth every 7 (seven) days. 12 capsule 0   No current facility-administered medications on file prior to visit.    Allergies  Allergen Reactions   Simvastatin Other (See Comments)    Muscle spasms    Family History  Problem Relation Age of Onset   Liver disease Father    Heart disease Mother    Colon cancer Paternal Aunt    Thyroid  disease Sister        overactive   Cancer Sister        lung, adrenal gland   Thyroid disease Brother        overactive   Colon cancer Other        aunt   Cancer Other        Colon Cancer-Aunt  Breast cancer Neg Hx    Esophageal cancer Neg Hx    Rectal cancer Neg Hx    Stomach cancer Neg Hx     BP 136/64 (BP Location: Right Arm, Patient Position: Sitting, Cuff Size: Normal)   Pulse 72   Ht _0  (1.6 m)   Wt 105 lb 9.6 oz (47.9 kg)   SpO2 96%   BMI 18.71 kg/m    Review of Systems Denies fever    Objective:   Physical Exam VITAL SIGNS:  See vs page GENERAL: no distress Neck: a healed scar is present at the right neck.  I do not appreciate a nodule in the thyroid or elsewhere in the neck.    Lab Results  Component Value Date   TSH 3.95 08/24/2021      Assessment & Plan:  Hyperthyroidism: well-controlled.  Please continue the same methimazole

## 2021-08-24 NOTE — Patient Instructions (Signed)
Blood tests are requested for you today.  We'll let you know about the results.    If ever you have fever while taking methimazole, stop it and call us, even if the reason is obvious, because of the risk of a rare side-effect. It is best to never miss the medication.  However, if you do miss it, next best is to double up the next time.   Please come back for a follow-up appointment in 6 months.

## 2021-09-01 ENCOUNTER — Other Ambulatory Visit: Payer: Self-pay | Admitting: Endocrinology

## 2021-09-01 DIAGNOSIS — J3489 Other specified disorders of nose and nasal sinuses: Secondary | ICD-10-CM | POA: Diagnosis not present

## 2021-09-01 DIAGNOSIS — C310 Malignant neoplasm of maxillary sinus: Secondary | ICD-10-CM | POA: Diagnosis not present

## 2021-09-01 DIAGNOSIS — Z9889 Other specified postprocedural states: Secondary | ICD-10-CM | POA: Diagnosis not present

## 2021-09-07 ENCOUNTER — Other Ambulatory Visit: Payer: Self-pay | Admitting: Internal Medicine

## 2021-09-27 ENCOUNTER — Other Ambulatory Visit: Payer: Self-pay | Admitting: Internal Medicine

## 2021-09-30 DIAGNOSIS — J328 Other chronic sinusitis: Secondary | ICD-10-CM | POA: Diagnosis not present

## 2021-09-30 DIAGNOSIS — J329 Chronic sinusitis, unspecified: Secondary | ICD-10-CM | POA: Diagnosis not present

## 2021-09-30 DIAGNOSIS — R234 Changes in skin texture: Secondary | ICD-10-CM | POA: Diagnosis not present

## 2021-09-30 DIAGNOSIS — C310 Malignant neoplasm of maxillary sinus: Secondary | ICD-10-CM | POA: Diagnosis not present

## 2021-09-30 DIAGNOSIS — J3489 Other specified disorders of nose and nasal sinuses: Secondary | ICD-10-CM | POA: Diagnosis not present

## 2021-09-30 DIAGNOSIS — Z87891 Personal history of nicotine dependence: Secondary | ICD-10-CM | POA: Diagnosis not present

## 2021-10-04 ENCOUNTER — Other Ambulatory Visit: Payer: Self-pay | Admitting: Internal Medicine

## 2021-10-04 NOTE — Telephone Encounter (Signed)
Please refill as per office routine med refill policy (all routine meds to be refilled for 3 mo or monthly (per pt preference) up to one year from last visit, then month to month grace period for 3 mo, then further med refills will have to be denied) ? ?

## 2021-10-20 DIAGNOSIS — C310 Malignant neoplasm of maxillary sinus: Secondary | ICD-10-CM | POA: Diagnosis not present

## 2021-10-20 DIAGNOSIS — R1312 Dysphagia, oropharyngeal phase: Secondary | ICD-10-CM | POA: Diagnosis not present

## 2021-10-20 DIAGNOSIS — R918 Other nonspecific abnormal finding of lung field: Secondary | ICD-10-CM | POA: Diagnosis not present

## 2021-10-25 ENCOUNTER — Other Ambulatory Visit: Payer: Self-pay | Admitting: Internal Medicine

## 2021-10-25 DIAGNOSIS — C310 Malignant neoplasm of maxillary sinus: Secondary | ICD-10-CM

## 2021-10-27 ENCOUNTER — Other Ambulatory Visit: Payer: Self-pay | Admitting: Internal Medicine

## 2021-11-02 DIAGNOSIS — J3489 Other specified disorders of nose and nasal sinuses: Secondary | ICD-10-CM | POA: Diagnosis not present

## 2021-11-02 DIAGNOSIS — J328 Other chronic sinusitis: Secondary | ICD-10-CM | POA: Insufficient documentation

## 2021-11-02 DIAGNOSIS — Z8522 Personal history of malignant neoplasm of nasal cavities, middle ear, and accessory sinuses: Secondary | ICD-10-CM | POA: Diagnosis not present

## 2021-11-14 ENCOUNTER — Other Ambulatory Visit: Payer: Self-pay | Admitting: Internal Medicine

## 2021-11-14 NOTE — Telephone Encounter (Signed)
Please refill as per office routine med refill policy (all routine meds to be refilled for 3 mo or monthly (per pt preference) up to one year from last visit, then month to month grace period for 3 mo, then further med refills will have to be denied) ? ?

## 2021-12-12 HISTORY — PX: EYE SURGERY: SHX253

## 2021-12-23 ENCOUNTER — Other Ambulatory Visit: Payer: Medicare Other

## 2021-12-23 ENCOUNTER — Other Ambulatory Visit (INDEPENDENT_AMBULATORY_CARE_PROVIDER_SITE_OTHER): Payer: Medicare Other

## 2021-12-23 DIAGNOSIS — E1140 Type 2 diabetes mellitus with diabetic neuropathy, unspecified: Secondary | ICD-10-CM

## 2021-12-23 DIAGNOSIS — E559 Vitamin D deficiency, unspecified: Secondary | ICD-10-CM | POA: Diagnosis not present

## 2021-12-23 DIAGNOSIS — E538 Deficiency of other specified B group vitamins: Secondary | ICD-10-CM

## 2021-12-23 LAB — HEPATIC FUNCTION PANEL
ALT: 21 U/L (ref 0–35)
AST: 21 U/L (ref 0–37)
Albumin: 4.1 g/dL (ref 3.5–5.2)
Alkaline Phosphatase: 60 U/L (ref 39–117)
Bilirubin, Direct: 0.1 mg/dL (ref 0.0–0.3)
Total Bilirubin: 0.6 mg/dL (ref 0.2–1.2)
Total Protein: 6.8 g/dL (ref 6.0–8.3)

## 2021-12-23 LAB — CBC WITH DIFFERENTIAL/PLATELET
Basophils Absolute: 0 10*3/uL (ref 0.0–0.1)
Basophils Relative: 0.2 % (ref 0.0–3.0)
Eosinophils Absolute: 0.1 10*3/uL (ref 0.0–0.7)
Eosinophils Relative: 2.8 % (ref 0.0–5.0)
HCT: 40.5 % (ref 36.0–46.0)
Hemoglobin: 13 g/dL (ref 12.0–15.0)
Lymphocytes Relative: 16.9 % (ref 12.0–46.0)
Lymphs Abs: 0.7 10*3/uL (ref 0.7–4.0)
MCHC: 32.2 g/dL (ref 30.0–36.0)
MCV: 83.2 fl (ref 78.0–100.0)
Monocytes Absolute: 0.3 10*3/uL (ref 0.1–1.0)
Monocytes Relative: 8.5 % (ref 3.0–12.0)
Neutro Abs: 2.8 10*3/uL (ref 1.4–7.7)
Neutrophils Relative %: 71.6 % (ref 43.0–77.0)
Platelets: 201 10*3/uL (ref 150.0–400.0)
RBC: 4.86 Mil/uL (ref 3.87–5.11)
RDW: 16.1 % — ABNORMAL HIGH (ref 11.5–15.5)
WBC: 3.9 10*3/uL — ABNORMAL LOW (ref 4.0–10.5)

## 2021-12-23 LAB — LIPID PANEL
Cholesterol: 147 mg/dL (ref 0–200)
HDL: 67 mg/dL (ref >39.00)
LDL Cholesterol: 67 mg/dL (ref 0–99)
NonHDL: 79.72
Total CHOL/HDL Ratio: 2
Triglycerides: 63 mg/dL (ref 0.0–149.0)
VLDL: 12.6 mg/dL (ref 0.0–40.0)

## 2021-12-23 LAB — URINALYSIS, ROUTINE W REFLEX MICROSCOPIC
Bilirubin Urine: NEGATIVE
Hgb urine dipstick: NEGATIVE
Ketones, ur: NEGATIVE
Leukocytes,Ua: NEGATIVE
Nitrite: NEGATIVE
Specific Gravity, Urine: 1.015 (ref 1.000–1.030)
Total Protein, Urine: NEGATIVE
Urine Glucose: NEGATIVE
Urobilinogen, UA: 0.2 (ref 0.0–1.0)
pH: 6.5 (ref 5.0–8.0)

## 2021-12-23 LAB — BASIC METABOLIC PANEL
BUN: 18 mg/dL (ref 6–23)
CO2: 33 mEq/L — ABNORMAL HIGH (ref 19–32)
Calcium: 9.2 mg/dL (ref 8.4–10.5)
Chloride: 99 mEq/L (ref 96–112)
Creatinine, Ser: 0.8 mg/dL (ref 0.40–1.20)
GFR: 71.63 mL/min (ref >60.00)
Glucose, Bld: 86 mg/dL (ref 70–99)
Potassium: 3.9 mEq/L (ref 3.5–5.1)
Sodium: 138 mEq/L (ref 135–145)

## 2021-12-23 LAB — MICROALBUMIN / CREATININE URINE RATIO
Creatinine,U: 88 mg/dL
Microalb Creat Ratio: 0.8 mg/g (ref 0.0–30.0)
Microalb, Ur: <0.7 mg/dL (ref 0.0–1.9)

## 2021-12-23 LAB — VITAMIN B12: Vitamin B-12: >1550 pg/mL — ABNORMAL HIGH (ref 211–911)

## 2021-12-23 LAB — VITAMIN D 25 HYDROXY (VIT D DEFICIENCY, FRACTURES): VITD: 89.23 ng/mL (ref 30.00–100.00)

## 2021-12-23 LAB — TSH: TSH: 4.15 u[IU]/mL (ref 0.35–5.50)

## 2021-12-24 LAB — HEMOGLOBIN A1C
Hgb A1c MFr Bld: 4.5 % of total Hgb (ref <5.7)
Mean Plasma Glucose: 82 mg/dL
eAG (mmol/L): 4.6 mmol/L

## 2021-12-29 ENCOUNTER — Ambulatory Visit: Payer: Medicare Other | Admitting: Internal Medicine

## 2022-01-03 ENCOUNTER — Other Ambulatory Visit: Payer: Self-pay | Admitting: Internal Medicine

## 2022-01-03 NOTE — Telephone Encounter (Signed)
Please refill as per office routine med refill policy (all routine meds to be refilled for 3 mo or monthly (per pt preference) up to one year from last visit, then month to month grace period for 3 mo, then further med refills will have to be denied) ? ?

## 2022-01-04 ENCOUNTER — Ambulatory Visit (INDEPENDENT_AMBULATORY_CARE_PROVIDER_SITE_OTHER): Payer: Medicare Other | Admitting: Internal Medicine

## 2022-01-04 ENCOUNTER — Encounter: Payer: Self-pay | Admitting: Internal Medicine

## 2022-01-04 VITALS — BP 132/70 | HR 65 | Ht 63.0 in | Wt 108.0 lb

## 2022-01-04 DIAGNOSIS — I10 Essential (primary) hypertension: Secondary | ICD-10-CM

## 2022-01-04 DIAGNOSIS — H903 Sensorineural hearing loss, bilateral: Secondary | ICD-10-CM

## 2022-01-04 DIAGNOSIS — E538 Deficiency of other specified B group vitamins: Secondary | ICD-10-CM | POA: Diagnosis not present

## 2022-01-04 DIAGNOSIS — E1140 Type 2 diabetes mellitus with diabetic neuropathy, unspecified: Secondary | ICD-10-CM | POA: Diagnosis not present

## 2022-01-04 DIAGNOSIS — Z0001 Encounter for general adult medical examination with abnormal findings: Secondary | ICD-10-CM

## 2022-01-04 DIAGNOSIS — I700 Atherosclerosis of aorta: Secondary | ICD-10-CM

## 2022-01-04 DIAGNOSIS — E7800 Pure hypercholesterolemia, unspecified: Secondary | ICD-10-CM | POA: Diagnosis not present

## 2022-01-04 DIAGNOSIS — E559 Vitamin D deficiency, unspecified: Secondary | ICD-10-CM | POA: Diagnosis not present

## 2022-01-04 NOTE — Patient Instructions (Signed)
Ok to cut back on the b12 supplement to every other day  Please make appt with Dr Owens Shark Mickel Crow for the wax in the ears and hearing problem  Please continue all other medications as before, and refills have been done if requested.  Please have the pharmacy call with any other refills you may need.  Please continue your efforts at being more active, low cholesterol diet, and weight control.  You are otherwise up to date with prevention measures today.  Please keep your appointments with your specialists as you may have planned  Please make an Appointment to return in 6 months, or sooner if needed, also with Lab Appointment for testing done 3-5 days before at the [facility] (so this is for TWO appointments - please see the scheduling desk as you leave)  Due to the ongoing Covid 19 pandemic, our lab now requires an appointment for any labs done at our office.  If you need labs done and do not have an appointment, please call our office ahead of time to schedule before presenting to the lab for your testing.

## 2022-01-04 NOTE — Progress Notes (Signed)
Patient ID: [name] [name], female   DOB: 1946/03/28, 76 y.o.   MRN: [id number]         Chief Complaint:: wellness exam and Follow-up  Low b12, dm, bilateral hearing loss       HPI:  [name] [name] is a 76 y.o. female here for wellness exam; declines covid booster, shingrix, and plans to call for eye exam herself soon, o/w up to date                        Also taking b12 daily and otherwise good med compliance,  Pt denies chest pain, increased sob or doe, wheezing, orthopnea, PND, increased LE swelling, palpitations, dizziness or syncope.   Pt denies polydipsia, polyuria, or new focal neuro s/s.   Pt denies fever, wt loss, night sweats, loss of appetite, or other constitutional symptoms  Has bilateral hearing loss worsening in the past 2 wks, does not want irrigation here,  but plans to f/u with ENT Dr Owens Shark if we see wax today   Only taking one metformin per day for unclear reason.   Wt Readings from Last 3 Encounters:  01/04/22 108 lb (49 kg)  08/24/21 105 lb 9.6 oz (47.9 kg)  06/23/21 106 lb 3.2 oz (48.2 kg)   BP Readings from Last 3 Encounters:  01/04/22 132/70  08/24/21 136/64  06/23/21 120/66   Immunization History  Administered Date(s) Administered   Fluad Quad(high Dose 65+) 07/15/2019   H1N1 10/13/2008   Influenza Split 07/31/2012   Influenza Whole 09/12/2007, 09/12/2008, 08/20/2009, 08/26/2011   Influenza, High Dose Seasonal PF 08/26/2014, 08/30/2016, 08/24/2017, 08/24/2018, 08/05/2019, 08/05/2019, 08/21/2020, 08/20/2021   Influenza, Seasonal, Injecte, Preservative Fre 08/26/2013   Influenza-Unspecified 08/27/2015, 10/12/2020   Moderna Sars-Covid-2 Vaccination 02/07/2020, 03/11/2020, 11/17/2020, 09/21/2021   Pneumococcal Conjugate-13 10/31/2014   Pneumococcal Polysaccharide-23 12/21/2010   Td 03/22/1995, 09/20/2007   Tdap 11/22/2017, 11/22/2017   There are no preventive care reminders to display for this patient.     Past Medical History:  Diagnosis Date   ARTHRITIS  01/30/2008   ASTHMA 09/03/2007   Cancer ([place]) 1987   palate cancer   COLON CANCER 04/07/2008   pt states she had colon cancer 1987   COPD 09/03/2007   COPD (chronic obstructive pulmonary disease) ([place]) 04/22/2013   CORONARY ARTERY DISEASE 09/03/2007   DEGENERATIVE JOINT DISEASE 01/30/2008   DIABETES MELLITUS 01/30/2008   Diabetic peripheral neuropathy ([place]) 04/22/2013   Eczema    GERD 09/03/2007   GLUCOSE INTOLERANCE 09/03/2007   GOITER, MULTINODULAR 05/21/2010   History of blood transfusion    HYPERCHOLESTEROLEMIA 09/03/2007   HYPERTENSION 09/03/2007   Impaired glucose tolerance 04/10/2011   LVH (left ventricular hypertrophy)    OBESITY 09/03/2007   Osteopenia    Psoriasis    Shortness of breath dyspnea    With exertion and allergies to pollen   Past Surgical History:  Procedure Laterality Date   ABDOMINAL HYSTERECTOMY     CARDIAC CATHETERIZATION     COLON SURGERY     FLOOR OF MOUTH BIOPSY N/A 12/24/2015   Procedure: ORAL BIOPSY;  Surgeon: Izora Gala, MD;  Location: [facility];  Service: ENT;  Laterality: N/A;   HEMICOLECTOMY  12/1985   cancer   HERNIA REPAIR     MAXILLECTOMY Right 01/22/2016   Procedure: RIGHT SIDE PARTIAL MAXILLECTOMY;  Surgeon: Izora Gala, MD;  Location: [place];  Service: ENT;  Laterality: Right;   TUBAL LIGATION      reports  that she quit smoking about 43 years ago. Her smoking use included cigarettes. She has a 15.00 pack-year smoking history. She has never used smokeless tobacco. She reports that she does not drink alcohol and does not use drugs. family history includes Cancer in her sister and another family member; Colon cancer in her paternal aunt and another family member; Heart disease in her mother; Liver disease in her father; Thyroid disease in her brother and sister. Allergies  Allergen Reactions   Simvastatin Other (See Comments)    Muscle spasms   Current Outpatient Medications on File Prior to Visit  Medication Sig Dispense Refill   acyclovir cream  (ZOVIRAX) 5 % Apply 1 application topically daily as needed. For psoriasis     amLODipine (NORVASC) 5 MG tablet TAKE 1 TABLET (5 MG TOTAL) BY MOUTH DAILY. 90 tablet 1   aspirin EC 81 MG tablet Take 1 tablet (81 mg total) by mouth daily. 90 tablet 11   Blood Glucose Monitoring Suppl (ONE TOUCH ULTRA SYSTEM KIT) W/DEVICE KIT 1 kit by Does not apply route once. 250.02 1 each 0   clobetasol (TEMOVATE) 0.05 % ointment Use twice once daily to hand and feet as needed (Patient taking differently: Apply 1 application topically 2 (two) times daily. Use twice once daily to hand and feet as needed) 30 g 1   hydrochlorothiazide (HYDRODIURIL) 25 MG tablet TAKE 1 TABLET BY MOUTH EVERY DAY 90 tablet 1   iron polysaccharides (NIFEREX) 150 MG capsule TAKE 1 CAPSULE BY MOUTH EVERY DAY 90 capsule 1   Lancets (ONETOUCH DELICA PLUS [id number]) MISC USE TO CHECK BLOOD SUGAR ONCE DAILY 100 each 1   loratadine-pseudoephedrine (CLARITIN-D 24-HOUR) 10-240 MG per 24 hr tablet Take 1 tablet by mouth daily.     methimazole (TAPAZOLE) 5 MG tablet TAKE 1 TABLET BY MOUTH EVERY DAY 90 tablet 3   metoprolol tartrate (LOPRESSOR) 25 MG tablet TAKE 1 & 1/2 TABLETS TWICE A DAY 270 tablet 1   naproxen (NAPROSYN) 500 MG tablet TAKE 1 TABLET (500 MG TOTAL) BY MOUTH 2 (TWO) TIMES DAILY WITH A MEAL. 180 tablet 0   omeprazole (PRILOSEC) 20 MG capsule Take by mouth.     omeprazole (PRILOSEC) 20 MG capsule TAKE 1 CAPSULE BY MOUTH EVERY DAY 90 capsule 1   ONETOUCH ULTRA test strip USE TO CHECK BLOOD SUGARS DAILY. ANNUAL APPT DUE IN JULY MUST SEE PROVIDER FOR FUTURE REFILLS 100 strip 0   tiZANidine (ZANAFLEX) 4 MG tablet TAKE 1 TABLET BY MOUTH EVERY 6 HOURS AS NEEDED FOR MUSCLE SPASMS. 360 tablet 1   traMADol (ULTRAM) 50 MG tablet TAKE 1 TABLET BY MOUTH EVERY 6 HOURS AS NEEDED FOR PAIN 120 tablet 5   albuterol (PROVENTIL HFA;VENTOLIN HFA) 108 (90 Base) MCG/ACT inhaler Inhale into the lungs. (Patient not taking: Reported on 01/04/2022)      budesonide-formoterol (SYMBICORT) 80-4.5 MCG/ACT inhaler Inhale 2 puffs into the lungs 2 (two) times daily. (Patient not taking: Reported on 01/04/2022) 1 Inhaler 4   lovastatin (MEVACOR) 40 MG tablet TAKE 2 TABLETS BY MOUTH DAILY. 180 tablet 3   No current facility-administered medications on file prior to visit.        ROS:  All others reviewed and negative.  Objective        PE:  BP 132/70 (BP Location: Left Arm, Patient Position: Sitting, Cuff Size: Normal)    Pulse 65    Ht _0  (1.6 m)    Wt 108 lb (49 kg)  SpO2 97%    BMI 19.13 kg/m                 Constitutional: Pt appears in NAD               HENT: Head: NCAT.                Right Ear: External ear normal.                 Left Ear: External ear normal.  Bilateral wax impactions noted               Eyes: . Pupils are equal, round, and reactive to light. Conjunctivae and EOM are normal               Nose: without d/c or deformity               Neck: Neck supple. Gross normal ROM               Cardiovascular: Normal rate and regular rhythm.                 Pulmonary/Chest: Effort normal and breath sounds without rales or wheezing.                Abd:  Soft, NT, ND, + BS, no organomegaly               Neurological: Pt is alert. At baseline orientation, motor grossly intact               Skin: Skin is warm. No rashes, no other new lesions, LE edema - none               Psychiatric: Pt behavior is normal without agitation   Micro: none  Cardiac tracings I have personally interpreted today:  none  Pertinent Radiological findings (summarize): none   Lab Results  Component Value Date   WBC 3.9 (L) 12/23/2021   HGB 13.0 12/23/2021   HCT 40.5 12/23/2021   PLT 201.0 12/23/2021   GLUCOSE 86 12/23/2021   CHOL 147 12/23/2021   TRIG 63.0 12/23/2021   HDL 67.00 12/23/2021   LDLCALC 67 12/23/2021   ALT 21 12/23/2021   AST 21 12/23/2021   NA 138 12/23/2021   K 3.9 12/23/2021   CL 99 12/23/2021   CREATININE 0.80 12/23/2021    BUN 18 12/23/2021   CO2 33 (H) 12/23/2021   TSH 4.15 12/23/2021   INR 1.04 02/08/2016   HGBA1C 4.5 12/23/2021   MICROALBUR <0.7 12/23/2021   Assessment/Plan:  [name] [name] is a 76 y.o. Black or African American [2] female with  has a past medical history of ARTHRITIS (01/30/2008), ASTHMA (09/03/2007), Cancer ([place]) (2778), COLON CANCER (04/07/2008), COPD (09/03/2007), COPD (chronic obstructive pulmonary disease) ([place]) (04/22/2013), CORONARY ARTERY DISEASE (09/03/2007), DEGENERATIVE JOINT DISEASE (01/30/2008), DIABETES MELLITUS (01/30/2008), Diabetic peripheral neuropathy ([place]) (04/22/2013), Eczema, GERD (09/03/2007), GLUCOSE INTOLERANCE (09/03/2007), GOITER, MULTINODULAR (05/21/2010), History of blood transfusion, HYPERCHOLESTEROLEMIA (09/03/2007), HYPERTENSION (09/03/2007), Impaired glucose tolerance (04/10/2011), LVH (left ventricular hypertrophy), OBESITY (09/03/2007), Osteopenia, Psoriasis, and Shortness of breath dyspnea.  Vitamin D deficiency Last vitamin D Lab Results  Component Value Date   VD25OH 89.23 12/23/2021   Stable, cont oral replacement   Encounter for well adult exam with abnormal findings Age and sex appropriate education and counseling updated with regular exercise and diet Referrals for preventative services - plans to call soon for eye exam Immunizations addressed - declines covid booster, shingrix Smoking  counseling  - none needed Evidence for depression or other mood disorder - none significant Most recent labs reviewed. I have personally reviewed and have noted: 1) the patient's medical and social history 2) The patient's current medications and supplements 3) The patient's height, weight, and BMI have been recorded in the chart   Aortic atherosclerosis (HCC) Pt to continue lovastatin, exercise, low chol diet  B12 deficiency Lab Results  Component Value Date   VITAMINB12 >1550 (H) 12/23/2021   Overcontrolled, ok to decrease oral replacement - b12 1000 mcg to  qod  Bilateral hearing loss Has bilateral wax impactions, pt states will f/u with ENT on her own  Diabetes mellitus with neuropathy Lab Results  Component Value Date   HGBA1C 4.5 12/23/2021   Stable, pt to continue current medical treatment metformin - 1 qd    Essential hypertension BP Readings from Last 3 Encounters:  01/04/22 132/70  08/24/21 136/64  06/23/21 120/66   Stable, pt to continue medical treatment norvasc, hct   HYPERCHOLESTEROLEMIA Lab Results  Component Value Date   LDLCALC 67 12/23/2021   Stable, pt to continue current lovastatin 40  Followup: Return in about 6 months (around 07/04/2022).  Cathlean Cower, MD 01/09/2022 12:52 PM [facility]

## 2022-01-04 NOTE — Assessment & Plan Note (Signed)
Last vitamin D Lab Results  Component Value Date   VD25OH 89.23 12/23/2021   Stable, cont oral replacement

## 2022-01-09 ENCOUNTER — Encounter: Payer: Self-pay | Admitting: Internal Medicine

## 2022-01-09 MED ORDER — METFORMIN HCL ER 500 MG PO TB24: 500.0000 mg | ORAL_TABLET | Freq: Every day | ORAL | 3 refills | Status: DC

## 2022-01-09 MED ORDER — VITAMIN B-12 1000 MCG PO TABS: 1000.0000 ug | ORAL_TABLET | ORAL | 3 refills | Status: AC

## 2022-01-09 NOTE — Assessment & Plan Note (Signed)
Pt to continue lovastatin, exercise, low chol diet

## 2022-01-09 NOTE — Assessment & Plan Note (Signed)
Lab Results  Component Value Date   LDLCALC 67 12/23/2021   Stable, pt to continue current lovastatin 40

## 2022-01-09 NOTE — Addendum Note (Signed)
Addended by: Biagio Borg on: 01/09/2022 12:53 PM   Modules accepted: Orders

## 2022-01-09 NOTE — Assessment & Plan Note (Signed)
Lab Results  Component Value Date   VITAMINB12 >1550 (H) 12/23/2021   Overcontrolled, ok to decrease oral replacement - b12 1000 mcg to qod

## 2022-01-09 NOTE — Assessment & Plan Note (Signed)
BP Readings from Last 3 Encounters:  01/04/22 132/70  08/24/21 136/64  06/23/21 120/66   Stable, pt to continue medical treatment norvasc, hct

## 2022-01-09 NOTE — Assessment & Plan Note (Signed)
Has bilateral wax impactions, pt states will f/u with ENT on her own

## 2022-01-09 NOTE — Assessment & Plan Note (Addendum)
Age and sex appropriate education and counseling updated with regular exercise and diet Referrals for preventative services - plans to call soon for eye exam Immunizations addressed - declines covid booster, shingrix Smoking counseling  - none needed Evidence for depression or other mood disorder - none significant Most recent labs reviewed. I have personally reviewed and have noted: 1) the patient's medical and social history 2) The patient's current medications and supplements 3) The patient's height, weight, and BMI have been recorded in the chart

## 2022-01-09 NOTE — Assessment & Plan Note (Signed)
Lab Results  Component Value Date   HGBA1C 4.5 12/23/2021   Stable, pt to continue current medical treatment metformin - 1 qd

## 2022-01-12 DIAGNOSIS — Z8522 Personal history of malignant neoplasm of nasal cavities, middle ear, and accessory sinuses: Secondary | ICD-10-CM | POA: Diagnosis not present

## 2022-01-12 DIAGNOSIS — Z923 Personal history of irradiation: Secondary | ICD-10-CM | POA: Diagnosis not present

## 2022-01-12 DIAGNOSIS — Z9889 Other specified postprocedural states: Secondary | ICD-10-CM | POA: Diagnosis not present

## 2022-01-12 DIAGNOSIS — C310 Malignant neoplasm of maxillary sinus: Secondary | ICD-10-CM | POA: Diagnosis not present

## 2022-01-12 DIAGNOSIS — Z87891 Personal history of nicotine dependence: Secondary | ICD-10-CM | POA: Diagnosis not present

## 2022-01-12 DIAGNOSIS — Z08 Encounter for follow-up examination after completed treatment for malignant neoplasm: Secondary | ICD-10-CM | POA: Diagnosis not present

## 2022-01-22 ENCOUNTER — Other Ambulatory Visit: Payer: Self-pay | Admitting: Internal Medicine

## 2022-01-22 NOTE — Telephone Encounter (Signed)
Please refill as per office routine med refill policy (all routine meds to be refilled for 3 mo or monthly (per pt preference) up to one year from last visit, then month to month grace period for 3 mo, then further med refills will have to be denied) ? ?

## 2022-02-01 DIAGNOSIS — J3489 Other specified disorders of nose and nasal sinuses: Secondary | ICD-10-CM | POA: Diagnosis not present

## 2022-02-01 DIAGNOSIS — J328 Other chronic sinusitis: Secondary | ICD-10-CM | POA: Diagnosis not present

## 2022-02-01 DIAGNOSIS — Z87891 Personal history of nicotine dependence: Secondary | ICD-10-CM | POA: Diagnosis not present

## 2022-02-14 ENCOUNTER — Other Ambulatory Visit: Payer: Self-pay | Admitting: Internal Medicine

## 2022-02-17 ENCOUNTER — Other Ambulatory Visit: Payer: Self-pay | Admitting: Internal Medicine

## 2022-02-17 NOTE — Telephone Encounter (Signed)
Please refill as per office routine med refill policy (all routine meds to be refilled for 3 mo or monthly (per pt preference) up to one year from last visit, then month to month grace period for 3 mo, then further med refills will have to be denied) ? ?

## 2022-02-22 ENCOUNTER — Other Ambulatory Visit: Payer: Self-pay

## 2022-02-22 ENCOUNTER — Ambulatory Visit (INDEPENDENT_AMBULATORY_CARE_PROVIDER_SITE_OTHER): Payer: Medicare Other | Admitting: Endocrinology

## 2022-02-22 VITALS — BP 128/80 | HR 62 | Ht 63.0 in | Wt 110.8 lb

## 2022-02-22 DIAGNOSIS — E0590 Thyrotoxicosis, unspecified without thyrotoxic crisis or storm: Secondary | ICD-10-CM

## 2022-02-22 LAB — T4, FREE: Free T4: 0.82 ng/dL (ref 0.60–1.60)

## 2022-02-22 LAB — TSH: TSH: 3.96 u[IU]/mL (ref 0.35–5.50)

## 2022-02-22 NOTE — Patient Instructions (Signed)
Blood tests are requested for you today.  We'll let you know about the results.    ?If ever you have fever while taking methimazole, stop it and call us, even if the reason is obvious, because of the risk of a rare side-effect. ?It is best to never miss the medication.  However, if you do miss it, next best is to double up the next time.   ?Please come back for a follow-up appointment in 6 months.  ?

## 2022-02-22 NOTE — Progress Notes (Signed)
? ?Subjective:  ? ? Patient ID: [name] [name], female    DOB: Dec 23, 1945, 76 y.o.   MRN: [id number] ? ?HPI ?Pt returns for f/u of a small multinodular goiter and hyperthyroidism (dx'ed 2011; nodules have not met criteria for bx; f/u ultrasound in 2014 was unchanged; in 2021, CT showed thyroid nodule, no f/u needed; in 2016, TSH was more suppressed; she declined RAI, so she was then rx'ed tapazole; she had surg for sinus CA in 2021 and 2022; she also had XRT).  she feels well in general.  She takes tapazole 5 mg QD, as rx'ed.  Specifically, she denies palpitations and tremor.   ?Past Medical History:  ?Diagnosis Date  ? ARTHRITIS 01/30/2008  ? ASTHMA 09/03/2007  ? Cancer [facility]) 1987  ? palate cancer  ? COLON CANCER 04/07/2008  ? pt states she had colon cancer 1987  ? COPD 09/03/2007  ? COPD (chronic obstructive pulmonary disease) ([place]) 04/22/2013  ? CORONARY ARTERY DISEASE 09/03/2007  ? DEGENERATIVE JOINT DISEASE 01/30/2008  ? DIABETES MELLITUS 01/30/2008  ? Diabetic peripheral neuropathy ([place]) 04/22/2013  ? Eczema   ? GERD 09/03/2007  ? GLUCOSE INTOLERANCE 09/03/2007  ? GOITER, MULTINODULAR 05/21/2010  ? History of blood transfusion   ? HYPERCHOLESTEROLEMIA 09/03/2007  ? HYPERTENSION 09/03/2007  ? Impaired glucose tolerance 04/10/2011  ? LVH (left ventricular hypertrophy)   ? OBESITY 09/03/2007  ? Osteopenia   ? Psoriasis   ? Shortness of breath dyspnea   ? With exertion and allergies to pollen  ? ? ?Past Surgical History:  ?Procedure Laterality Date  ? ABDOMINAL HYSTERECTOMY    ? CARDIAC CATHETERIZATION    ? COLON SURGERY    ? FLOOR OF MOUTH BIOPSY N/A 12/24/2015  ? Procedure: ORAL BIOPSY;  Surgeon: Izora Gala, MD;  Location: [place];  Service: ENT;  Laterality: N/A;  ? HEMICOLECTOMY  12/1985  ? cancer  ? HERNIA REPAIR    ? MAXILLECTOMY Right 01/22/2016  ? Procedure: RIGHT SIDE PARTIAL MAXILLECTOMY;  Surgeon: Izora Gala, MD;  Location: [place];  Service: ENT;  Laterality: Right;  ? TUBAL LIGATION    ? ? ?Social History   ? ?Socioeconomic History  ? Marital status: Married  ?  Spouse name: Not on file  ? Number of children: 4  ? Years of education: Not on file  ? Highest education level: Not on file  ?Occupational History  ? Occupation: stay at home  ?  Employer: UNEMPLOYED  ?Tobacco Use  ? Smoking status: Former  ?  Packs/day: 1.00  ?  Years: 15.00  ?  Pack years: 15.00  ?  Types: Cigarettes  ?  Quit date: 12/12/1978  ?  Years since quitting: 43.2  ? Smokeless tobacco: Never  ?Substance and Sexual Activity  ? Alcohol use: No  ? Drug use: No  ? Sexual activity: Not on file  ?Other Topics Concern  ? Not on file  ?Social History Narrative  ? 1 child died with meningitis  ? ?Social Determinants of Health  ? ?Financial Resource Strain: Not on file  ?Food Insecurity: Not on file  ?Transportation Needs: Not on file  ?Physical Activity: Not on file  ?Stress: Not on file  ?Social Connections: Not on file  ?Intimate Partner Violence: Not on file  ? ? ?Current Outpatient Medications on File Prior to Visit  ?Medication Sig Dispense Refill  ? acyclovir cream (ZOVIRAX) 5 % Apply 1 application topically daily as needed. For psoriasis    ? albuterol (PROVENTIL HFA;VENTOLIN HFA)  108 (90 Base) MCG/ACT inhaler Inhale into the lungs.    ? amLODipine (NORVASC) 5 MG tablet TAKE 1 TABLET (5 MG TOTAL) BY MOUTH DAILY. 90 tablet 0  ? aspirin EC 81 MG tablet Take 1 tablet (81 mg total) by mouth daily. 90 tablet 11  ? Blood Glucose Monitoring Suppl (ONE TOUCH ULTRA SYSTEM KIT) W/DEVICE KIT 1 kit by Does not apply route once. 250.02 1 each 0  ? budesonide-formoterol (SYMBICORT) 80-4.5 MCG/ACT inhaler Inhale 2 puffs into the lungs 2 (two) times daily. 1 Inhaler 4  ? clobetasol (TEMOVATE) 0.05 % ointment Use twice once daily to hand and feet as needed (Patient taking differently: Apply 1 application. topically 2 (two) times daily. Use twice once daily to hand and feet as needed) 30 g 1  ? hydrochlorothiazide (HYDRODIURIL) 25 MG tablet TAKE 1 TABLET BY MOUTH EVERY  DAY 90 tablet 1  ? iron polysaccharides (NIFEREX) 150 MG capsule TAKE 1 CAPSULE BY MOUTH EVERY DAY 90 capsule 1  ? Lancets (ONETOUCH DELICA PLUS [id number]) MISC USE TO CHECK BLOOD SUGAR ONCE DAILY 100 each 1  ? loratadine-pseudoephedrine (CLARITIN-D 24-HOUR) 10-240 MG per 24 hr tablet Take 1 tablet by mouth daily.    ? lovastatin (MEVACOR) 40 MG tablet TAKE 2 TABLETS BY MOUTH DAILY. 180 tablet 3  ? metFORMIN (GLUCOPHAGE-XR) 500 MG 24 hr tablet Take 1 tablet (500 mg total) by mouth daily. 90 tablet 3  ? methimazole (TAPAZOLE) 5 MG tablet TAKE 1 TABLET BY MOUTH EVERY DAY 90 tablet 3  ? metoprolol tartrate (LOPRESSOR) 25 MG tablet TAKE 1 & 1/2 TABLETS TWICE A DAY 270 tablet 3  ? naproxen (NAPROSYN) 500 MG tablet TAKE 1 TABLET (500 MG TOTAL) BY MOUTH 2 (TWO) TIMES DAILY WITH A MEAL. 180 tablet 0  ? omeprazole (PRILOSEC) 20 MG capsule Take by mouth.    ? omeprazole (PRILOSEC) 20 MG capsule TAKE 1 CAPSULE BY MOUTH EVERY DAY 90 capsule 1  ? ONETOUCH ULTRA test strip USE TO CHECK BLOOD SUGARS DAILY. ANNUAL APPT DUE IN JULY MUST SEE PROVIDER FOR FUTURE REFILLS 100 strip 0  ? tiZANidine (ZANAFLEX) 4 MG tablet TAKE 1 TABLET BY MOUTH EVERY 6 HOURS AS NEEDED FOR MUSCLE SPASMS. 360 tablet 1  ? traMADol (ULTRAM) 50 MG tablet TAKE 1 TABLET BY MOUTH EVERY 6 HOURS AS NEEDED FOR PAIN 120 tablet 5  ? vitamin B-12 (CYANOCOBALAMIN) 1000 MCG tablet Take 1 tablet (1,000 mcg total) by mouth every other day. 45 tablet 3  ? ?No current facility-administered medications on file prior to visit.  ? ? ?Allergies  ?Allergen Reactions  ? Simvastatin Other (See Comments)  ?  Muscle spasms  ? ? ?Family History  ?Problem Relation Age of Onset  ? Liver disease Father   ? Heart disease Mother   ? Colon cancer Paternal Aunt   ? Thyroid disease Sister   ?     overactive  ? Cancer Sister   ?     lung, adrenal gland  ? Thyroid disease Brother   ?     overactive  ? Colon cancer Other   ?     aunt  ? Cancer Other   ?     Colon Cancer-Aunt  ? Breast cancer Neg  Hx   ? Esophageal cancer Neg Hx   ? Rectal cancer Neg Hx   ? Stomach cancer Neg Hx   ? ? ?BP 128/80 (BP Location: Left Arm, Patient Position: Sitting, Cuff Size: Normal)   Pulse  62   Ht 5' 3" (1.6 m)   Wt 110 lb 12.8 oz (50.3 kg)   SpO2 100%   BMI 19.63 kg/m?  ? ? ?Review of Systems ?Denies fever ?   ?Objective:  ? Physical Exam ?VITAL SIGNS:  See vs page ?GENERAL: no distress ?NECK: There is no palpable thyroid enlargement.  No thyroid nodule is palpable.  No palpable lymphadenopathy at the anterior neck.   ? ? ?Lab Results  ?Component Value Date  ? TSH 3.96 02/22/2022  ? ?   ?Assessment & Plan:  ?Hyperthyroidism: well-controlled.  Please continue the same methimazole.   ? ?

## 2022-03-04 ENCOUNTER — Other Ambulatory Visit: Payer: Self-pay | Admitting: Internal Medicine

## 2022-03-04 NOTE — Telephone Encounter (Signed)
Please refill as per office routine med refill policy (all routine meds to be refilled for 3 mo or monthly (per pt preference) up to one year from last visit, then month to month grace period for 3 mo, then further med refills will have to be denied) ? ?

## 2022-04-25 ENCOUNTER — Other Ambulatory Visit: Payer: Self-pay | Admitting: Internal Medicine

## 2022-04-25 DIAGNOSIS — H02831 Dermatochalasis of right upper eyelid: Secondary | ICD-10-CM | POA: Diagnosis not present

## 2022-04-25 DIAGNOSIS — H2513 Age-related nuclear cataract, bilateral: Secondary | ICD-10-CM | POA: Diagnosis not present

## 2022-04-25 DIAGNOSIS — H40053 Ocular hypertension, bilateral: Secondary | ICD-10-CM | POA: Diagnosis not present

## 2022-04-25 DIAGNOSIS — C310 Malignant neoplasm of maxillary sinus: Secondary | ICD-10-CM

## 2022-04-25 DIAGNOSIS — H52203 Unspecified astigmatism, bilateral: Secondary | ICD-10-CM | POA: Diagnosis not present

## 2022-04-25 LAB — HM DIABETES EYE EXAM

## 2022-06-06 DIAGNOSIS — H2513 Age-related nuclear cataract, bilateral: Secondary | ICD-10-CM | POA: Diagnosis not present

## 2022-06-06 DIAGNOSIS — H401132 Primary open-angle glaucoma, bilateral, moderate stage: Secondary | ICD-10-CM | POA: Diagnosis not present

## 2022-06-17 DIAGNOSIS — H2513 Age-related nuclear cataract, bilateral: Secondary | ICD-10-CM | POA: Diagnosis not present

## 2022-06-20 ENCOUNTER — Other Ambulatory Visit: Payer: Self-pay | Admitting: Internal Medicine

## 2022-06-20 NOTE — Telephone Encounter (Signed)
Please refill as per office routine med refill policy (all routine meds to be refilled for 3 mo or monthly (per pt preference) up to one year from last visit, then month to month grace period for 3 mo, then further med refills will have to be denied) ? ?

## 2022-06-21 ENCOUNTER — Other Ambulatory Visit: Payer: Self-pay

## 2022-06-21 DIAGNOSIS — I10 Essential (primary) hypertension: Secondary | ICD-10-CM

## 2022-06-21 MED ORDER — AMLODIPINE BESYLATE 5 MG PO TABS: 5.0000 mg | ORAL_TABLET | Freq: Every day | ORAL | 0 refills | Status: DC

## 2022-06-21 NOTE — Progress Notes (Signed)
Refill request submitted

## 2022-06-29 ENCOUNTER — Other Ambulatory Visit (INDEPENDENT_AMBULATORY_CARE_PROVIDER_SITE_OTHER): Payer: Medicare Other

## 2022-06-29 DIAGNOSIS — E559 Vitamin D deficiency, unspecified: Secondary | ICD-10-CM

## 2022-06-29 DIAGNOSIS — E538 Deficiency of other specified B group vitamins: Secondary | ICD-10-CM

## 2022-06-29 DIAGNOSIS — E1140 Type 2 diabetes mellitus with diabetic neuropathy, unspecified: Secondary | ICD-10-CM

## 2022-06-29 LAB — BASIC METABOLIC PANEL
BUN: 13 mg/dL (ref 6–23)
CO2: 32 mEq/L (ref 19–32)
Calcium: 9.4 mg/dL (ref 8.4–10.5)
Chloride: 98 mEq/L (ref 96–112)
Creatinine, Ser: 0.78 mg/dL (ref 0.40–1.20)
GFR: 73.57 mL/min (ref >60.00)
Glucose, Bld: 80 mg/dL (ref 70–99)
Potassium: 3.8 mEq/L (ref 3.5–5.1)
Sodium: 138 mEq/L (ref 135–145)

## 2022-06-29 LAB — LIPID PANEL
Cholesterol: 139 mg/dL (ref 0–200)
HDL: 63.4 mg/dL (ref >39.00)
LDL Cholesterol: 59 mg/dL (ref 0–99)
NonHDL: 75.54
Total CHOL/HDL Ratio: 2
Triglycerides: 83 mg/dL (ref 0.0–149.0)
VLDL: 16.6 mg/dL (ref 0.0–40.0)

## 2022-06-29 LAB — HEPATIC FUNCTION PANEL
ALT: 14 U/L (ref 0–35)
AST: 20 U/L (ref 0–37)
Albumin: 4.3 g/dL (ref 3.5–5.2)
Alkaline Phosphatase: 62 U/L (ref 39–117)
Bilirubin, Direct: 0.2 mg/dL (ref 0.0–0.3)
Total Bilirubin: 0.9 mg/dL (ref 0.2–1.2)
Total Protein: 7 g/dL (ref 6.0–8.3)

## 2022-06-29 LAB — VITAMIN B12: Vitamin B-12: >1504 pg/mL — ABNORMAL HIGH (ref 211–911)

## 2022-06-29 LAB — VITAMIN D 25 HYDROXY (VIT D DEFICIENCY, FRACTURES): VITD: 84.48 ng/mL (ref 30.00–100.00)

## 2022-06-30 LAB — HEMOGLOBIN A1C
Hgb A1c MFr Bld: 4.4 % of total Hgb (ref <5.7)
Mean Plasma Glucose: 80 mg/dL
eAG (mmol/L): 4.4 mmol/L

## 2022-07-06 ENCOUNTER — Ambulatory Visit (INDEPENDENT_AMBULATORY_CARE_PROVIDER_SITE_OTHER): Payer: Medicare Other | Admitting: Internal Medicine

## 2022-07-06 ENCOUNTER — Encounter: Payer: Self-pay | Admitting: Internal Medicine

## 2022-07-06 VITALS — BP 136/70 | HR 51 | Temp 98.5°F | Ht 63.0 in | Wt 111.0 lb

## 2022-07-06 DIAGNOSIS — E559 Vitamin D deficiency, unspecified: Secondary | ICD-10-CM

## 2022-07-06 DIAGNOSIS — H903 Sensorineural hearing loss, bilateral: Secondary | ICD-10-CM

## 2022-07-06 DIAGNOSIS — E7800 Pure hypercholesterolemia, unspecified: Secondary | ICD-10-CM | POA: Diagnosis not present

## 2022-07-06 DIAGNOSIS — H409 Unspecified glaucoma: Secondary | ICD-10-CM | POA: Insufficient documentation

## 2022-07-06 DIAGNOSIS — D649 Anemia, unspecified: Secondary | ICD-10-CM | POA: Diagnosis not present

## 2022-07-06 DIAGNOSIS — H401121 Primary open-angle glaucoma, left eye, mild stage: Secondary | ICD-10-CM

## 2022-07-06 DIAGNOSIS — I10 Essential (primary) hypertension: Secondary | ICD-10-CM

## 2022-07-06 DIAGNOSIS — E538 Deficiency of other specified B group vitamins: Secondary | ICD-10-CM | POA: Diagnosis not present

## 2022-07-06 DIAGNOSIS — E119 Type 2 diabetes mellitus without complications: Secondary | ICD-10-CM | POA: Diagnosis not present

## 2022-07-06 NOTE — Progress Notes (Signed)
Patient ID: ORLI DEGRAVE, female   DOB: June 30, 1946, 76 y.o.   MRN: [id number]        Chief Complaint: follow up HTN, HLD and hyperglycemia , low vit d and b12       HPI:  [name] [name] is a 76 y.o. female here overall doing ok, Pt denies chest pain, increased sob or doe, wheezing, orthopnea, PND, increased LE swelling, palpitations, dizziness or syncope.   Pt denies polydipsia, polyuria, or new focal neuro s/s.    Pt denies fever, wt loss, night sweats, loss of appetite, or other constitutional symptoms   Has recurring wax impactions and hearing reduced, has new ENT she has not been able to see since last visit, but still intends to make a visit, Declines irrigation to remove wax today.  Due for cataract surgury soon with new optho in Aug 2023  Wt up several lbs with boost supplements 3 times per day.  Taking B12 daily.  Taking iron.  Wt Readings from Last 3 Encounters:  07/06/22 111 lb (50.3 kg)  02/22/22 110 lb 12.8 oz (50.3 kg)  01/04/22 108 lb (49 kg)   BP Readings from Last 3 Encounters:  07/06/22 136/70  02/22/22 128/80  01/04/22 132/70         Past Medical History:  Diagnosis Date   ARTHRITIS 01/30/2008   ASTHMA 09/03/2007   Cancer ([place]) 1987   palate cancer   COLON CANCER 04/07/2008   pt states she had colon cancer 1987   COPD 09/03/2007   COPD (chronic obstructive pulmonary disease) ([place]) 04/22/2013   CORONARY ARTERY DISEASE 09/03/2007   DEGENERATIVE JOINT DISEASE 01/30/2008   DIABETES MELLITUS 01/30/2008   Diabetic peripheral neuropathy ([place]) 04/22/2013   Eczema    GERD 09/03/2007   GLUCOSE INTOLERANCE 09/03/2007   GOITER, MULTINODULAR 05/21/2010   History of blood transfusion    HYPERCHOLESTEROLEMIA 09/03/2007   HYPERTENSION 09/03/2007   Impaired glucose tolerance 04/10/2011   LVH (left ventricular hypertrophy)    OBESITY 09/03/2007   Osteopenia    Psoriasis    Shortness of breath dyspnea    With exertion and allergies to pollen   Past Surgical History:  Procedure  Laterality Date   ABDOMINAL HYSTERECTOMY     CARDIAC CATHETERIZATION     COLON SURGERY     FLOOR OF MOUTH BIOPSY N/A 12/24/2015   Procedure: ORAL BIOPSY;  Surgeon: Izora Gala, MD;  [facility]: [facility];  Service: ENT;  Laterality: N/A;   HEMICOLECTOMY  12/1985   cancer   HERNIA REPAIR     MAXILLECTOMY Right 01/22/2016   Procedure: RIGHT SIDE PARTIAL MAXILLECTOMY;  Surgeon: Izora Gala, MD;  Location: [facility];  Service: ENT;  Laterality: Right;   TUBAL LIGATION      reports that she quit smoking about 43 years ago. Her smoking use included cigarettes. She has a 15.00 pack-year smoking history. She has never used smokeless tobacco. She reports that she does not drink alcohol and does not use drugs. family history includes Cancer in her sister and another family member; Colon cancer in her paternal aunt and another family member; Heart disease in her mother; Liver disease in her father; Thyroid disease in her brother and sister. Allergies  Allergen Reactions   Simvastatin Other (See Comments)    Muscle spasms   Current Outpatient Medications on File Prior to Visit  Medication Sig Dispense Refill   acyclovir cream (ZOVIRAX) 5 % Apply 1 application topically daily as needed. For psoriasis  albuterol (PROVENTIL HFA;VENTOLIN HFA) 108 (90 Base) MCG/ACT inhaler Inhale into the lungs.     amLODipine (NORVASC) 5 MG tablet Take 1 tablet (5 mg total) by mouth daily. 90 tablet 0   aspirin EC 81 MG tablet Take 1 tablet (81 mg total) by mouth daily. 90 tablet 11   budesonide-formoterol (SYMBICORT) 80-4.5 MCG/ACT inhaler Inhale 2 puffs into the lungs 2 (two) times daily. 1 Inhaler 4   clobetasol (TEMOVATE) 0.05 % ointment Use twice once daily to hand and feet as needed (Patient taking differently: Apply 1 application  topically 2 (two) times daily. Use twice once daily to hand and feet as needed) 30 g 1   hydrochlorothiazide (HYDRODIURIL) 25 MG tablet TAKE 1 TABLET BY MOUTH EVERY DAY 90 tablet 1   iron  polysaccharides (NIFEREX) 150 MG capsule TAKE 1 CAPSULE BY MOUTH EVERY DAY 90 capsule 1   Lancets (ONETOUCH DELICA PLUS [id number]) MISC USE TO CHECK BLOOD SUGAR ONCE DAILY 100 each 1   loratadine-pseudoephedrine (CLARITIN-D 24-HOUR) 10-240 MG per 24 hr tablet Take 1 tablet by mouth daily.     lovastatin (MEVACOR) 40 MG tablet TAKE 2 TABLETS BY MOUTH DAILY. 180 tablet 3   methimazole (TAPAZOLE) 5 MG tablet TAKE 1 TABLET BY MOUTH EVERY DAY 90 tablet 3   metoprolol tartrate (LOPRESSOR) 25 MG tablet TAKE 1 & 1/2 TABLETS TWICE A DAY 270 tablet 3   naproxen (NAPROSYN) 500 MG tablet TAKE 1 TABLET (500 MG TOTAL) BY MOUTH 2 (TWO) TIMES DAILY WITH A MEAL. 180 tablet 0   omeprazole (PRILOSEC) 20 MG capsule TAKE 1 CAPSULE BY MOUTH EVERY DAY 90 capsule 1   ONETOUCH ULTRA test strip USE TO CHECK BLOOD SUGARS DAILY. ANNUAL APPT DUE IN JULY MUST SEE PROVIDER FOR FUTURE REFILLS 100 strip 0   tiZANidine (ZANAFLEX) 4 MG tablet TAKE 1 TABLET BY MOUTH EVERY 6 HOURS AS NEEDED FOR MUSCLE SPASMS. 360 tablet 1   traMADol (ULTRAM) 50 MG tablet TAKE 1 TABLET BY MOUTH EVERY 6 HOURS AS NEEDED FOR PAIN 120 tablet 5   vitamin B-12 (CYANOCOBALAMIN) 1000 MCG tablet Take 1 tablet (1,000 mcg total) by mouth every other day. 45 tablet 3   Blood Glucose Monitoring Suppl (ONE TOUCH ULTRA SYSTEM KIT) W/DEVICE KIT 1 kit by Does not apply route once. 250.02 (Patient not taking: Reported on 07/06/2022) 1 each 0   No current facility-administered medications on file prior to visit.        ROS:  All others reviewed and negative.  Objective        PE:  BP 136/70 (BP Location: Left Arm, Patient Position: Sitting, Cuff Size: Normal)   Pulse (!) 51   Temp 98.5 F (36.9 C) (Oral)   Ht '5\' 3"'  (1.6 m)   Wt 111 lb (50.3 kg)   SpO2 100%   BMI 19.66 kg/m                 Constitutional: Pt appears in NAD               HENT: Head: NCAT.                Right Ear: External ear normal.                 Left Ear: External ear normal.                 Eyes: . Pupils are equal, round, and reactive to light. Conjunctivae and EOM are normal  Nose: without d/c or deformity               Neck: Neck supple. Gross normal ROM               Cardiovascular: Normal rate and regular rhythm.                 Pulmonary/Chest: Effort normal and breath sounds without rales or wheezing.                Abd:  Soft, NT, ND, + BS, no organomegaly               Neurological: Pt is alert. At baseline orientation, motor grossly intact               Skin: Skin is warm. No rashes, no other new lesions, LE edema - none               Psychiatric: Pt behavior is normal without agitation   Micro: none  Cardiac tracings I have personally interpreted today:  none  Pertinent Radiological findings (summarize): none   Lab Results  Component Value Date   WBC 3.9 (L) 12/23/2021   HGB 13.0 12/23/2021   HCT 40.5 12/23/2021   PLT 201.0 12/23/2021   GLUCOSE 80 06/29/2022   CHOL 139 06/29/2022   TRIG 83.0 06/29/2022   HDL 63.40 06/29/2022   LDLCALC 59 06/29/2022   ALT 14 06/29/2022   AST 20 06/29/2022   NA 138 06/29/2022   K 3.8 06/29/2022   CL 98 06/29/2022   CREATININE 0.78 06/29/2022   BUN 13 06/29/2022   CO2 32 06/29/2022   TSH 3.96 02/22/2022   INR 1.04 02/08/2016   HGBA1C 4.4 06/29/2022   MICROALBUR <0.7 12/23/2021   Assessment/Plan:  HALEEMAH BUCKALEW is a 76 y.o. Black or African American [2] female with  has a past medical history of ARTHRITIS (01/30/2008), ASTHMA (09/03/2007), Cancer ([place]) (0938), COLON CANCER (04/07/2008), COPD (09/03/2007), COPD (chronic obstructive pulmonary disease) ([place]) (04/22/2013), CORONARY ARTERY DISEASE (09/03/2007), DEGENERATIVE JOINT DISEASE (01/30/2008), DIABETES MELLITUS (01/30/2008), Diabetic peripheral neuropathy ([place]) (04/22/2013), Eczema, GERD (09/03/2007), GLUCOSE INTOLERANCE (09/03/2007), GOITER, MULTINODULAR (05/21/2010), History of blood transfusion, HYPERCHOLESTEROLEMIA (09/03/2007), HYPERTENSION (09/03/2007),  Impaired glucose tolerance (04/10/2011), LVH (left ventricular hypertrophy), OBESITY (09/03/2007), Osteopenia, Psoriasis, and Shortness of breath dyspnea.  Vitamin D deficiency Last vitamin D Lab Results  Component Value Date   VD25OH 84.48 06/29/2022   Stable, cont oral replacement   B12 deficiency Lab Results  Component Value Date   VITAMINB12 >1504 (H) 06/29/2022   overcontrolled, pt to reduce oral replacement - b12 1000 mcg  To every other day  Bilateral hearing loss Has persistent bilat wax impactions, declines irrigation, and pt encouraged to f/u with her new ENT as she intends soon  Type 2 diabetes mellitus without complication, without long-term current use of insulin ([place]) Lab Results  Component Value Date   HGBA1C 4.4 06/29/2022   Now overcontrolled,, pt to stop metformin, cont diet, wt control, excercise   HYPERCHOLESTEROLEMIA Lab Results  Component Value Date   LDLCALC 59 06/29/2022   Stable, pt to continue current statin lovastatin 80 mg qd   Essential hypertension BP Readings from Last 3 Encounters:  07/06/22 136/70  02/22/22 128/80  01/04/22 132/70   Stable, pt to continue medical treatment norvasc 5 qd, hct 25 qd   Anemia Lab Results  Component Value Date   WBC 3.9 (L) 12/23/2021   HGB 13.0 12/23/2021   HCT 40.5 12/23/2021  MCV 83.2 12/23/2021   PLT 201.0 12/23/2021   Resolved deficiency, ok to d/c iron  Followup: Return in about 6 months (around 01/06/2023).  Cathlean Cower, MD 07/09/2022 12:19 PM [facility]

## 2022-07-06 NOTE — Assessment & Plan Note (Signed)
Has persistent bilat wax impactions, declines irrigation, and pt encouraged to f/u with her new ENT as she intends soon

## 2022-07-06 NOTE — Assessment & Plan Note (Signed)
Last vitamin D Lab Results  Component Value Date   VD25OH 84.48 06/29/2022   Stable, cont oral replacement

## 2022-07-06 NOTE — Patient Instructions (Signed)
Ok to cut back on the B12 to 2 times per week  Ok to stop the metformin  Ok to stop the iron supplement  Please continue the Boost as you do  Please follow up with your new ENT for the ears with wax and hearing loss  Please follow up with your new eye doctor August for the cataract as you mentioned  Please continue all other medications as before, and refills have been done if requested.  Please have the pharmacy call with any other refills you may need.  Please keep your appointments with your specialists as you may have planned  Please make an Appointment to return in 6 months, or sooner if needed, also with Lab Appointment for testing done 3-5 days before at the [facility] (so this is for TWO appointments - please see the scheduling desk as you leave)

## 2022-07-06 NOTE — Assessment & Plan Note (Addendum)
Lab Results  Component Value Date   VITAMINB12 >1504 (H) 06/29/2022   overcontrolled, pt to reduce oral replacement - b12 1000 mcg  To every other day

## 2022-07-09 ENCOUNTER — Encounter: Payer: Self-pay | Admitting: Internal Medicine

## 2022-07-09 NOTE — Assessment & Plan Note (Signed)
Lab Results  Component Value Date   HGBA1C 4.4 06/29/2022   Now overcontrolled,, pt to stop metformin, cont diet, wt control, excercise

## 2022-07-09 NOTE — Assessment & Plan Note (Signed)
Lab Results  Component Value Date   LDLCALC 59 06/29/2022   Stable, pt to continue current statin lovastatin 80 mg qd

## 2022-07-09 NOTE — Assessment & Plan Note (Signed)
BP Readings from Last 3 Encounters:  07/06/22 136/70  02/22/22 128/80  01/04/22 132/70   Stable, pt to continue medical treatment norvasc 5 qd, hct 25 qd

## 2022-07-09 NOTE — Assessment & Plan Note (Signed)
Lab Results  Component Value Date   WBC 3.9 (L) 12/23/2021   HGB 13.0 12/23/2021   HCT 40.5 12/23/2021   MCV 83.2 12/23/2021   PLT 201.0 12/23/2021   Resolved deficiency, ok to d/c iron

## 2022-07-26 DIAGNOSIS — H269 Unspecified cataract: Secondary | ICD-10-CM | POA: Diagnosis not present

## 2022-07-26 DIAGNOSIS — H2512 Age-related nuclear cataract, left eye: Secondary | ICD-10-CM | POA: Diagnosis not present

## 2022-08-02 DIAGNOSIS — H6123 Impacted cerumen, bilateral: Secondary | ICD-10-CM | POA: Diagnosis not present

## 2022-08-02 DIAGNOSIS — J3489 Other specified disorders of nose and nasal sinuses: Secondary | ICD-10-CM | POA: Diagnosis not present

## 2022-08-02 DIAGNOSIS — J328 Other chronic sinusitis: Secondary | ICD-10-CM | POA: Diagnosis not present

## 2022-08-02 DIAGNOSIS — T162XXA Foreign body in left ear, initial encounter: Secondary | ICD-10-CM | POA: Diagnosis not present

## 2022-08-02 DIAGNOSIS — Z87891 Personal history of nicotine dependence: Secondary | ICD-10-CM | POA: Diagnosis not present

## 2022-08-03 ENCOUNTER — Other Ambulatory Visit: Payer: Self-pay

## 2022-08-03 MED ORDER — METHIMAZOLE 5 MG PO TABS: 5.0000 mg | ORAL_TABLET | Freq: Every day | ORAL | 0 refills | Status: DC

## 2022-08-09 DIAGNOSIS — H2511 Age-related nuclear cataract, right eye: Secondary | ICD-10-CM | POA: Diagnosis not present

## 2022-08-09 DIAGNOSIS — H269 Unspecified cataract: Secondary | ICD-10-CM | POA: Diagnosis not present

## 2022-08-22 ENCOUNTER — Ambulatory Visit: Payer: Self-pay | Admitting: Licensed Clinical Social Worker

## 2022-08-22 NOTE — Patient Outreach (Signed)
  Care Coordination   Initial Visit Note   08/22/2022 Name: [name] [name] MRN: [id number] DOB: October 21, 1946  [name] [name] is a 76 y.o. year old female who sees Biagio Borg, MD for primary care. I spoke with  [name] [name] by phone today.  What matters to the patients health and wellness today?  Managing what she currently has in place at this time    Goals Addressed             This Visit's Progress    COMPLETED: Care Coordination Activities No Follow up Required       Care Coordination Interventions: Reviewed Care Coordination Services: Declined Discussed benefits of Medicare Annual Wellness Visit: declined        SDOH assessments and interventions completed:  No   Care Coordination Interventions Activated:  Yes  Care Coordination Interventions:  Yes, provided   Follow up plan: No further intervention required.   Encounter Outcome:  Pt. Visit Completed   Casimer Lanius, [place] [phone]

## 2022-08-22 NOTE — Patient Instructions (Signed)
Visit Information  Thank you for taking time to visit with me today. Please don't hesitate to contact me if I can be of assistance to you.   Following are the goals we discussed today:   Goals Addressed             This Visit's Progress    COMPLETED: Care Coordination Activities No Follow up Required       Care Coordination Interventions: Reviewed Care Coordination Services: Declined Discussed benefits of Medicare Annual Wellness Visit: declined        Please call the care guide team at [phone] if you need to cancel or reschedule your appointment.   The patient verbalized understanding of instructions, educational materials, and care plan provided today and DECLINED offer to receive copy of patient instructions, educational materials, and care plan.   No further follow up required: by Care Coordination at this time  Casimer Lanius, [place] [phone]

## 2022-09-01 ENCOUNTER — Other Ambulatory Visit: Payer: Self-pay | Admitting: Internal Medicine

## 2022-09-01 MED ORDER — HYDROCHLOROTHIAZIDE 25 MG PO TABS
25.0000 mg | ORAL_TABLET | Freq: Every day | ORAL | 1 refills | Status: DC
Start: 2022-09-01 — End: 2023-02-21

## 2022-09-02 ENCOUNTER — Encounter: Payer: Self-pay | Admitting: Internal Medicine

## 2022-09-02 ENCOUNTER — Ambulatory Visit (INDEPENDENT_AMBULATORY_CARE_PROVIDER_SITE_OTHER): Payer: Medicare Other | Admitting: Internal Medicine

## 2022-09-02 ENCOUNTER — Telehealth: Payer: Self-pay | Admitting: Internal Medicine

## 2022-09-02 VITALS — BP 114/68 | HR 64 | Ht 63.0 in | Wt 114.0 lb

## 2022-09-02 DIAGNOSIS — E042 Nontoxic multinodular goiter: Secondary | ICD-10-CM

## 2022-09-02 DIAGNOSIS — E0590 Thyrotoxicosis, unspecified without thyrotoxic crisis or storm: Secondary | ICD-10-CM

## 2022-09-02 LAB — TSH: TSH: 6.73 u[IU]/mL — ABNORMAL HIGH (ref 0.35–5.50)

## 2022-09-02 LAB — T4, FREE: Free T4: 0.77 ng/dL (ref 0.60–1.60)

## 2022-09-02 MED ORDER — METHIMAZOLE 5 MG PO TABS: 5.0000 mg | ORAL_TABLET | ORAL | 3 refills | Status: DC

## 2022-09-02 NOTE — Telephone Encounter (Signed)
Patient informed and expresses understanding.

## 2022-09-02 NOTE — Telephone Encounter (Signed)
Please let the patient know that the current dose of methimazole is a bit too much for her.    I suggest from now on she needs to skip taking it on Sundays altogether, but continue to take 1 tablet daily Monday through Saturday    Thanks

## 2022-09-02 NOTE — Progress Notes (Signed)
Name: [name] [name]  MRN/ DOB: [id number], 18-Mar-1946    Age/ Sex: 76 y.o., female     PCP: Biagio Borg, MD   Reason for Endocrinology Evaluation: Hyperthyroidism/MNG     Initial Endocrinology Clinic Visit: 04/03/2013    PATIENT IDENTIFIER: [name] [name] is a 76 y.o., female with a past medical history of HTN, CAD, hyperthyroidism, asthma and DM. She has followed with [facility] since 04/03/2013 for consultative assistance with management of her Hyperthyroidism/MNG.   HISTORICAL SUMMARY: The patient was first diagnosed with multinodular goiter and hyperthyroid in 2011.  She was not started on methimazole until 2016.  She had declined radioactive iodine ablation in the past by her previous endocrinologist note   Of note she has had recurrent adenocarcinoma of the maxillary sinus that she was initially diagnosed with in 2016.  None of the thyroid nodules has met criteria in the past for FNA, with the last thyroid ultrasound in 2014 showed stability of the nodules   She was followed by Dr. Loanne Drilling from 2014 until March 2023  SUBJECTIVE:    Today (09/02/2022):  [name] is here for hyper thyroidism and multinodular goiter.  She continues to follow-up with ENT for maxillary sinus cancer  Denies recent weight loss  Appetite is improving , has issues with swallowing due to graft  Recent Abx for infection  Recent radiation treatment  Denies palpitations, loose stools , diarrhea  Denies tremors  Denies local neck swelling  Has occasional posterior neck  pain   Methimazole 5 mg daily  HISTORY:  Past Medical History:  Past Medical History:  Diagnosis Date   ARTHRITIS 01/30/2008   ASTHMA 09/03/2007   Cancer ([place]) 1987   palate cancer   COLON CANCER 04/07/2008   pt states she had colon cancer 1987   COPD 09/03/2007   COPD (chronic obstructive pulmonary disease) ([place]) 04/22/2013   CORONARY ARTERY DISEASE 09/03/2007   DEGENERATIVE JOINT DISEASE 01/30/2008    DIABETES MELLITUS 01/30/2008   Diabetic peripheral neuropathy ([place]) 04/22/2013   Eczema    GERD 09/03/2007   GLUCOSE INTOLERANCE 09/03/2007   GOITER, MULTINODULAR 05/21/2010   History of blood transfusion    HYPERCHOLESTEROLEMIA 09/03/2007   HYPERTENSION 09/03/2007   Impaired glucose tolerance 04/10/2011   LVH (left ventricular hypertrophy)    OBESITY 09/03/2007   Osteopenia    Psoriasis    Shortness of breath dyspnea    With exertion and allergies to pollen   Past Surgical History:  Past Surgical History:  Procedure Laterality Date   ABDOMINAL HYSTERECTOMY     CARDIAC CATHETERIZATION     COLON SURGERY     FLOOR OF MOUTH BIOPSY N/A 12/24/2015   Procedure: ORAL BIOPSY;  Surgeon: Izora Gala, MD;  [facility];  Service: ENT;  Laterality: N/A;   HEMICOLECTOMY  12/1985   cancer   HERNIA REPAIR     MAXILLECTOMY Right 01/22/2016   Procedure: RIGHT SIDE PARTIAL MAXILLECTOMY;  Surgeon: Izora Gala, MD;  Location: [facility];  Service: ENT;  Laterality: Right;   TUBAL LIGATION     Social History:  reports that she quit smoking about 43 years ago. Her smoking use included cigarettes. She has a 15.00 pack-year smoking history. She has never used smokeless tobacco. She reports that she does not drink alcohol and does not use drugs. Family History:  Family History  Problem Relation Age of Onset   Liver disease Father    Heart disease Mother  Colon cancer Paternal Aunt    Thyroid disease Sister        overactive   Cancer Sister        lung, adrenal gland   Thyroid disease Brother        overactive   Colon cancer Other        aunt   Cancer Other        Colon Cancer-Aunt   Breast cancer Neg Hx    Esophageal cancer Neg Hx    Rectal cancer Neg Hx    Stomach cancer Neg Hx      HOME MEDICATIONS: Allergies as of 09/02/2022       Reactions   Simvastatin Other (See Comments)   Muscle spasms        Medication List        Accurate as of September 02, 2022  9:04 AM. If you have  any questions, ask your nurse or doctor.          STOP taking these medications    budesonide-formoterol 80-4.5 MCG/ACT inhaler Commonly known as: SYMBICORT Stopped by: Dorita Sciara, MD       TAKE these medications    acyclovir cream 5 % Commonly known as: ZOVIRAX Apply 1 application topically daily as needed. For psoriasis   albuterol 108 (90 Base) MCG/ACT inhaler Commonly known as: VENTOLIN HFA Inhale into the lungs.   amLODipine 5 MG tablet Commonly known as: NORVASC Take 1 tablet (5 mg total) by mouth daily.   aspirin EC 81 MG tablet Take 1 tablet (81 mg total) by mouth daily.   cholecalciferol 25 MCG (1000 UNIT) tablet Commonly known as: VITAMIN D3 Take 1,000 Units by mouth daily. Twice a week   clobetasol ointment 0.05 % Commonly known as: TEMOVATE Use twice once daily to hand and feet as needed What changed:  how much to take how to take this when to take this   cyanocobalamin 1000 MCG tablet Commonly known as: VITAMIN B12 Take 1 tablet (1,000 mcg total) by mouth every other day. What changed: additional instructions   hydrochlorothiazide 25 MG tablet Commonly known as: HYDRODIURIL Take 1 tablet (25 mg total) by mouth daily.   iron polysaccharides 150 MG capsule Commonly known as: NIFEREX TAKE 1 CAPSULE BY MOUTH EVERY DAY   loratadine-pseudoephedrine 10-240 MG 24 hr tablet Commonly known as: CLARITIN-D 24-hour Take 1 tablet by mouth daily.   lovastatin 40 MG tablet Commonly known as: MEVACOR TAKE 2 TABLETS BY MOUTH DAILY. What changed:  how much to take how to take this additional instructions   methimazole 5 MG tablet Commonly known as: TAPAZOLE Take 1 tablet (5 mg total) by mouth daily.   metoprolol tartrate 25 MG tablet Commonly known as: LOPRESSOR TAKE 1 & 1/2 TABLETS TWICE A DAY   naproxen 500 MG tablet Commonly known as: NAPROSYN TAKE 1 TABLET (500 MG TOTAL) BY MOUTH 2 (TWO) TIMES DAILY WITH A MEAL.   omeprazole 20  MG capsule Commonly known as: PRILOSEC TAKE 1 CAPSULE BY MOUTH EVERY DAY   ONE TOUCH ULTRA SYSTEM KIT w/Device Kit 1 kit by Does not apply route once. 098.11   OneTouch Delica Plus [id number] Misc USE TO CHECK BLOOD SUGAR ONCE DAILY   OneTouch Ultra test strip Generic drug: glucose blood USE TO CHECK BLOOD SUGARS DAILY. ANNUAL APPT DUE IN JULY MUST SEE PROVIDER FOR FUTURE REFILLS   tiZANidine 4 MG tablet Commonly known as: ZANAFLEX TAKE 1 TABLET BY MOUTH EVERY 6 HOURS AS  NEEDED FOR MUSCLE SPASMS.   traMADol 50 MG tablet Commonly known as: ULTRAM TAKE 1 TABLET BY MOUTH EVERY 6 HOURS AS NEEDED FOR PAIN          OBJECTIVE:   PHYSICAL EXAM: VS: BP 114/68 (BP Location: Left Arm, Patient Position: Sitting, Cuff Size: Small)   Pulse 64   Ht _0  (1.6 m)   Wt 114 lb (51.7 kg)   SpO2 96%   BMI 20.19 kg/m    EXAM: General: Pt appears well and is in NAD  Eyes: External eye exam normal without stare, lid lag or exophthalmos.  EOM intact.    Neck: General: Supple without adenopathy. Thyroid: Thyroid size normal.  No goiter or nodules appreciated. No thyroid bruit.  Lungs: Clear with good BS bilat with no rales, rhonchi, or wheezes  Heart: Auscultation: RRR.  Abdomen: Normoactive bowel sounds, soft, nontender, without masses or organomegaly palpable  Extremities:  BL LE: No pretibial edema normal ROM and strength.  Mental Status: Judgment, insight: Intact Orientation: Oriented to time, place, and person Mood and affect: No depression, anxiety, or agitation     DATA REVIEWED:   Latest Reference Range & Units 09/02/22 09:21  TSH 0.35 - 5.50 uIU/mL 6.73 (H)  T4,Free(Direct) 0.60 - 1.60 ng/dL 0.77     Latest Reference Range & Units 06/29/22 08:41  Sodium 135 - 145 mEq/L 138  Potassium 3.5 - 5.1 mEq/L 3.8  Chloride 96 - 112 mEq/L 98  CO2 19 - 32 mEq/L 32  Glucose 70 - 99 mg/dL 80  BUN 6 - 23 mg/dL 13  Creatinine 0.40 - 1.20 mg/dL 0.78  Calcium 8.4 - 10.5 mg/dL 9.4   Alkaline Phosphatase 39 - 117 U/L 62  Albumin 3.5 - 5.2 g/dL 4.3  AST 0 - 37 U/L 20  ALT 0 - 35 U/L 14  Total Protein 6.0 - 8.3 g/dL 7.0  Bilirubin, Direct 0.0 - 0.3 mg/dL 0.2  Total Bilirubin 0.2 - 1.2 mg/dL 0.9  GFR >60.00 mL/min 73.57    Latest Reference Range & Units 02/22/22 10:18  TSH 0.35 - 5.50 uIU/mL 3.96  T4,Free(Direct) 0.60 - 1.60 ng/dL 0.82     Old records , labs and images have been reviewed.   ASSESSMENT / PLAN / RECOMMENDATIONS:   Hyperthyroidism  -Patient is clinically euthyroid -TSH is elevated, will reduce methimazole as below   Medications   Decrease methimazole 5 mg, 1 tablet Monday through Saturday (skip Sundays)     2.MNG:  -No local neck symptoms -We will continue to monitor clinically -Last ultrasound in 2014 showed stability over the prior 3 years   Follow-up in 6 months  Signed electronically by: Mack Guise, MD  [facility]  [address] [place], [place] [address] Phone: [phone] FAX: [phone]      CC: Biagio Borg, [place] [address] Phone: [phone]  Fax: [phone]   Return to Endocrinology clinic as below: Future Appointments  Date Time Provider [place]  09/02/2022  9:10 AM Liany Mumpower, Melanie Crazier, MD [facility] None  01/06/2023 10:00 AM Biagio Borg, MD [facility] None

## 2022-09-07 DIAGNOSIS — Z961 Presence of intraocular lens: Secondary | ICD-10-CM | POA: Diagnosis not present

## 2022-09-29 DIAGNOSIS — H5340 Unspecified visual field defects: Secondary | ICD-10-CM | POA: Diagnosis not present

## 2022-09-29 DIAGNOSIS — H401122 Primary open-angle glaucoma, left eye, moderate stage: Secondary | ICD-10-CM | POA: Diagnosis not present

## 2022-09-29 DIAGNOSIS — H02403 Unspecified ptosis of bilateral eyelids: Secondary | ICD-10-CM | POA: Diagnosis not present

## 2022-10-09 ENCOUNTER — Other Ambulatory Visit: Payer: Self-pay | Admitting: Internal Medicine

## 2022-10-09 NOTE — Telephone Encounter (Signed)
Please refill as per office routine med refill policy (all routine meds to be refilled for 3 mo or monthly (per pt preference) up to one year from last visit, then month to month grace period for 3 mo, then further med refills will have to be denied) ? ?

## 2022-10-17 ENCOUNTER — Other Ambulatory Visit: Payer: Self-pay

## 2022-10-17 DIAGNOSIS — C310 Malignant neoplasm of maxillary sinus: Secondary | ICD-10-CM

## 2022-10-17 MED ORDER — TRAMADOL HCL 50 MG PO TABS: 50.0000 mg | ORAL_TABLET | Freq: Four times a day (QID) | ORAL | 5 refills | Status: DC | PRN

## 2022-10-17 NOTE — Telephone Encounter (Signed)
Pt requesting medication refill

## 2022-10-18 DIAGNOSIS — R918 Other nonspecific abnormal finding of lung field: Secondary | ICD-10-CM | POA: Diagnosis not present

## 2022-10-18 DIAGNOSIS — C310 Malignant neoplasm of maxillary sinus: Secondary | ICD-10-CM | POA: Diagnosis not present

## 2022-10-18 DIAGNOSIS — R930 Abnormal findings on diagnostic imaging of skull and head, not elsewhere classified: Secondary | ICD-10-CM | POA: Diagnosis not present

## 2022-10-18 DIAGNOSIS — R1312 Dysphagia, oropharyngeal phase: Secondary | ICD-10-CM | POA: Diagnosis not present

## 2022-10-19 DIAGNOSIS — C310 Malignant neoplasm of maxillary sinus: Secondary | ICD-10-CM | POA: Diagnosis not present

## 2022-10-19 DIAGNOSIS — H029 Unspecified disorder of eyelid: Secondary | ICD-10-CM | POA: Diagnosis not present

## 2022-10-19 DIAGNOSIS — R918 Other nonspecific abnormal finding of lung field: Secondary | ICD-10-CM | POA: Diagnosis not present

## 2022-10-29 ENCOUNTER — Other Ambulatory Visit: Payer: Self-pay | Admitting: Internal Medicine

## 2022-10-29 DIAGNOSIS — I10 Essential (primary) hypertension: Secondary | ICD-10-CM

## 2022-10-29 NOTE — Telephone Encounter (Signed)
Please refill as per office routine med refill policy (all routine meds to be refilled for 3 mo or monthly (per pt preference) up to one year from last visit, then month to month grace period for 3 mo, then further med refills will have to be denied) ? ?

## 2022-11-14 DIAGNOSIS — C310 Malignant neoplasm of maxillary sinus: Secondary | ICD-10-CM | POA: Diagnosis not present

## 2022-11-14 DIAGNOSIS — D3161 Benign neoplasm of unspecified site of right orbit: Secondary | ICD-10-CM | POA: Diagnosis not present

## 2022-11-14 DIAGNOSIS — H6691 Otitis media, unspecified, right ear: Secondary | ICD-10-CM | POA: Diagnosis not present

## 2022-11-14 DIAGNOSIS — D487 Neoplasm of uncertain behavior of other specified sites: Secondary | ICD-10-CM | POA: Diagnosis not present

## 2022-11-18 DIAGNOSIS — E0590 Thyrotoxicosis, unspecified without thyrotoxic crisis or storm: Secondary | ICD-10-CM | POA: Diagnosis not present

## 2022-11-18 DIAGNOSIS — C310 Malignant neoplasm of maxillary sinus: Secondary | ICD-10-CM | POA: Diagnosis not present

## 2022-11-18 DIAGNOSIS — Z01812 Encounter for preprocedural laboratory examination: Secondary | ICD-10-CM | POA: Diagnosis not present

## 2022-11-18 DIAGNOSIS — E1140 Type 2 diabetes mellitus with diabetic neuropathy, unspecified: Secondary | ICD-10-CM | POA: Diagnosis not present

## 2022-11-18 DIAGNOSIS — I700 Atherosclerosis of aorta: Secondary | ICD-10-CM | POA: Diagnosis not present

## 2022-11-22 DIAGNOSIS — C6961 Malignant neoplasm of right orbit: Secondary | ICD-10-CM | POA: Diagnosis not present

## 2022-11-22 DIAGNOSIS — C310 Malignant neoplasm of maxillary sinus: Secondary | ICD-10-CM | POA: Diagnosis not present

## 2022-11-22 DIAGNOSIS — J45909 Unspecified asthma, uncomplicated: Secondary | ICD-10-CM | POA: Diagnosis not present

## 2022-11-22 DIAGNOSIS — C7949 Secondary malignant neoplasm of other parts of nervous system: Secondary | ICD-10-CM | POA: Diagnosis not present

## 2022-11-22 DIAGNOSIS — R760 Raised antibody titer: Secondary | ICD-10-CM | POA: Diagnosis not present

## 2022-11-22 DIAGNOSIS — R198 Other specified symptoms and signs involving the digestive system and abdomen: Secondary | ICD-10-CM | POA: Diagnosis not present

## 2022-11-22 DIAGNOSIS — R7303 Prediabetes: Secondary | ICD-10-CM | POA: Diagnosis not present

## 2022-11-22 DIAGNOSIS — Z87891 Personal history of nicotine dependence: Secondary | ICD-10-CM | POA: Diagnosis not present

## 2022-11-22 DIAGNOSIS — C7989 Secondary malignant neoplasm of other specified sites: Secondary | ICD-10-CM | POA: Diagnosis not present

## 2022-11-22 DIAGNOSIS — E785 Hyperlipidemia, unspecified: Secondary | ICD-10-CM | POA: Diagnosis not present

## 2022-11-22 DIAGNOSIS — Z79899 Other long term (current) drug therapy: Secondary | ICD-10-CM | POA: Diagnosis not present

## 2022-11-22 DIAGNOSIS — E0590 Thyrotoxicosis, unspecified without thyrotoxic crisis or storm: Secondary | ICD-10-CM | POA: Diagnosis not present

## 2022-11-22 DIAGNOSIS — K219 Gastro-esophageal reflux disease without esophagitis: Secondary | ICD-10-CM | POA: Diagnosis not present

## 2022-11-22 DIAGNOSIS — I10 Essential (primary) hypertension: Secondary | ICD-10-CM | POA: Diagnosis not present

## 2022-12-14 DIAGNOSIS — D4989 Neoplasm of unspecified behavior of other specified sites: Secondary | ICD-10-CM | POA: Diagnosis not present

## 2022-12-14 DIAGNOSIS — Z483 Aftercare following surgery for neoplasm: Secondary | ICD-10-CM | POA: Diagnosis not present

## 2022-12-29 ENCOUNTER — Other Ambulatory Visit: Payer: Medicare Other

## 2022-12-29 ENCOUNTER — Encounter: Payer: Self-pay | Admitting: Internal Medicine

## 2022-12-29 ENCOUNTER — Other Ambulatory Visit (INDEPENDENT_AMBULATORY_CARE_PROVIDER_SITE_OTHER): Payer: Medicare Other

## 2022-12-29 DIAGNOSIS — E559 Vitamin D deficiency, unspecified: Secondary | ICD-10-CM

## 2022-12-29 DIAGNOSIS — D649 Anemia, unspecified: Secondary | ICD-10-CM

## 2022-12-29 DIAGNOSIS — E538 Deficiency of other specified B group vitamins: Secondary | ICD-10-CM

## 2022-12-29 DIAGNOSIS — E119 Type 2 diabetes mellitus without complications: Secondary | ICD-10-CM

## 2022-12-29 LAB — BASIC METABOLIC PANEL
BUN: 10 mg/dL (ref 6–23)
CO2: 33 mEq/L — ABNORMAL HIGH (ref 19–32)
Calcium: 9.3 mg/dL (ref 8.4–10.5)
Chloride: 101 mEq/L (ref 96–112)
Creatinine, Ser: 0.78 mg/dL (ref 0.40–1.20)
GFR: 73.31 mL/min (ref >60.00)
Glucose, Bld: 83 mg/dL (ref 70–99)
Potassium: 4 mEq/L (ref 3.5–5.1)
Sodium: 140 mEq/L (ref 135–145)

## 2022-12-29 LAB — URINALYSIS, ROUTINE W REFLEX MICROSCOPIC
Bilirubin Urine: NEGATIVE
Hgb urine dipstick: NEGATIVE
Ketones, ur: NEGATIVE
Leukocytes,Ua: NEGATIVE
Nitrite: NEGATIVE
Specific Gravity, Urine: 1.02 (ref 1.000–1.030)
Total Protein, Urine: NEGATIVE
Urine Glucose: NEGATIVE
Urobilinogen, UA: 0.2 (ref 0.0–1.0)
pH: 7 (ref 5.0–8.0)

## 2022-12-29 LAB — CBC WITH DIFFERENTIAL/PLATELET
Basophils Absolute: 0 10*3/uL (ref 0.0–0.1)
Basophils Relative: 0.3 % (ref 0.0–3.0)
Eosinophils Absolute: 0.2 10*3/uL (ref 0.0–0.7)
Eosinophils Relative: 2.7 % (ref 0.0–5.0)
HCT: 37.6 % (ref 36.0–46.0)
Hemoglobin: 12.6 g/dL (ref 12.0–15.0)
Lymphocytes Relative: 15 % (ref 12.0–46.0)
Lymphs Abs: 0.9 10*3/uL (ref 0.7–4.0)
MCHC: 33.5 g/dL (ref 30.0–36.0)
MCV: 81.6 fl (ref 78.0–100.0)
Monocytes Absolute: 0.4 10*3/uL (ref 0.1–1.0)
Monocytes Relative: 7 % (ref 3.0–12.0)
Neutro Abs: 4.5 10*3/uL (ref 1.4–7.7)
Neutrophils Relative %: 75 % (ref 43.0–77.0)
Platelets: 248 10*3/uL (ref 150.0–400.0)
RBC: 4.61 Mil/uL (ref 3.87–5.11)
RDW: 15.2 % (ref 11.5–15.5)
WBC: 6 10*3/uL (ref 4.0–10.5)

## 2022-12-29 LAB — VITAMIN B12: Vitamin B-12: 1394 pg/mL — ABNORMAL HIGH (ref 211–911)

## 2022-12-29 LAB — MICROALBUMIN / CREATININE URINE RATIO
Creatinine,U: 151 mg/dL
Microalb Creat Ratio: 1.8 mg/g (ref 0.0–30.0)
Microalb, Ur: 2.7 mg/dL — ABNORMAL HIGH (ref 0.0–1.9)

## 2022-12-29 LAB — IBC PANEL
Iron: 90 ug/dL (ref 42–145)
Saturation Ratios: 22.6 % (ref 20.0–50.0)
TIBC: 399 ug/dL (ref 250.0–450.0)
Transferrin: 285 mg/dL (ref 212.0–360.0)

## 2022-12-29 LAB — HEPATIC FUNCTION PANEL
ALT: 15 U/L (ref 0–35)
AST: 21 U/L (ref 0–37)
Albumin: 4 g/dL (ref 3.5–5.2)
Alkaline Phosphatase: 65 U/L (ref 39–117)
Bilirubin, Direct: 0.2 mg/dL (ref 0.0–0.3)
Total Bilirubin: 0.6 mg/dL (ref 0.2–1.2)
Total Protein: 6.9 g/dL (ref 6.0–8.3)

## 2022-12-29 LAB — LIPID PANEL
Cholesterol: 167 mg/dL (ref 0–200)
HDL: 64.5 mg/dL (ref >39.00)
LDL Cholesterol: 85 mg/dL (ref 0–99)
NonHDL: 102.71
Total CHOL/HDL Ratio: 3
Triglycerides: 89 mg/dL (ref 0.0–149.0)
VLDL: 17.8 mg/dL (ref 0.0–40.0)

## 2022-12-29 LAB — VITAMIN D 25 HYDROXY (VIT D DEFICIENCY, FRACTURES): VITD: 67.51 ng/mL (ref 30.00–100.00)

## 2022-12-29 LAB — FERRITIN: Ferritin: 20.7 ng/mL (ref 10.0–291.0)

## 2022-12-30 LAB — HEMOGLOBIN A1C
Hgb A1c MFr Bld: 4.8 % of total Hgb (ref <5.7)
Mean Plasma Glucose: 91 mg/dL
eAG (mmol/L): 5 mmol/L

## 2023-01-06 ENCOUNTER — Encounter: Payer: Self-pay | Admitting: Internal Medicine

## 2023-01-06 ENCOUNTER — Ambulatory Visit (INDEPENDENT_AMBULATORY_CARE_PROVIDER_SITE_OTHER): Payer: Medicare Other | Admitting: Internal Medicine

## 2023-01-06 VITALS — BP 128/72 | HR 115 | Temp 97.6°F | Ht 63.0 in | Wt 115.0 lb

## 2023-01-06 DIAGNOSIS — I10 Essential (primary) hypertension: Secondary | ICD-10-CM | POA: Diagnosis not present

## 2023-01-06 DIAGNOSIS — E538 Deficiency of other specified B group vitamins: Secondary | ICD-10-CM

## 2023-01-06 DIAGNOSIS — E1140 Type 2 diabetes mellitus with diabetic neuropathy, unspecified: Secondary | ICD-10-CM

## 2023-01-06 DIAGNOSIS — E559 Vitamin D deficiency, unspecified: Secondary | ICD-10-CM | POA: Diagnosis not present

## 2023-01-06 DIAGNOSIS — R000 Tachycardia, unspecified: Secondary | ICD-10-CM | POA: Diagnosis not present

## 2023-01-06 DIAGNOSIS — D649 Anemia, unspecified: Secondary | ICD-10-CM | POA: Diagnosis not present

## 2023-01-06 DIAGNOSIS — E7800 Pure hypercholesterolemia, unspecified: Secondary | ICD-10-CM

## 2023-01-06 DIAGNOSIS — Z0001 Encounter for general adult medical examination with abnormal findings: Secondary | ICD-10-CM

## 2023-01-06 NOTE — Assessment & Plan Note (Signed)
BP Readings from Last 3 Encounters:  01/06/23 128/72  09/02/22 114/68  07/06/22 136/70   Stable, pt to continue medical treatment norvasc 5 mg qd, hct 25 qd, lopressor 25 mg 1.5 bid

## 2023-01-06 NOTE — Assessment & Plan Note (Signed)
Lab Results  Component Value Date   WBC 6.0 12/29/2022   HGB 12.6 12/29/2022   HCT 37.6 12/29/2022   MCV 81.6 12/29/2022   PLT 248.0 12/29/2022   Resolved, cont to follow

## 2023-01-06 NOTE — Patient Instructions (Addendum)
Your EKG was done today, and did not show any abnormal heart rhythm such as afib  Please continue all other medications as before, and refills have been done if requested.  Please have the pharmacy call with any other refills you may need.  Please continue your efforts at being more active, low cholesterol diet, and weight control.  You are otherwise up to date with prevention measures today.  Please keep your appointments with your specialists as you may have planned  No further lab testing needed today  Please remember to sign up for MyChart if you have not done so, as this will be important to you in the future with finding out test results, communicating by private email, and scheduling acute appointments online when needed.  Please make an Appointment to return in 6 months, or sooner if needed

## 2023-01-06 NOTE — Progress Notes (Signed)
Patient ID: [name] [name], female   DOB: [birth year], 76 y.o.   MRN: [id number]         Chief Complaint:: wellness exam and anemia, tachycardia, htn, low B12, hld       HPI:  [name] [name] is a 77 y.o. female here for wellness exam; declines covid booster and shingrix for now, o/w up to date                        Also pt noted tachycardia on arrival with HR 115 but Pt denies chest pain, increased sob or doe, wheezing, orthopnea, PND, increased LE swelling, palpitations, dizziness or syncope.  Has been taking po well recently.   Pt denies polydipsia, polyuria, or new focal neuro s/s.    Pt denies fever, wt loss, night sweats, loss of appetite, or other constitutional symptoms  Pt quite pleased with her oncology care overall.     Wt Readings from Last 3 Encounters:  01/06/23 115 lb (52.2 kg)  09/02/22 114 lb (51.7 kg)  07/06/22 111 lb (50.3 kg)   BP Readings from Last 3 Encounters:  01/06/23 128/72  09/02/22 114/68  07/06/22 136/70   Immunization History  Administered Date(s) Administered   Fluad Quad(high Dose 65+) 07/15/2019, 09/02/2022   H1N1 10/13/2008   Influenza Split 07/31/2012   Influenza Whole 09/12/2007, 09/12/2008, 08/20/2009, 08/26/2011   Influenza, High Dose Seasonal PF 08/26/2014, 08/30/2016, 08/24/2017, 08/24/2018, 08/05/2019, 08/05/2019, 08/21/2020, 08/20/2021   Influenza, Seasonal, Injecte, Preservative Fre 08/26/2013   Influenza-Unspecified 08/27/2015, 10/12/2020   Moderna Sars-Covid-2 Vaccination 02/07/2020, 03/11/2020, 11/17/2020, 09/21/2021, 10/13/2022   Pneumococcal Conjugate-13 10/31/2014   Pneumococcal Polysaccharide-23 12/21/2010   Td 03/22/1995, 09/20/2007   Tdap 11/22/2017, 11/22/2017   Health Maintenance Due  Topic Date Due   COVID-19 Vaccine (6 - 2023-24 season) 12/08/2022      Past Medical History:  Diagnosis Date   ARTHRITIS 01/30/2008   ASTHMA 09/03/2007   Cancer ([place]) 1987   palate cancer   COLON CANCER 04/07/2008   pt states she had colon  cancer 1987   COPD 09/03/2007   COPD (chronic obstructive pulmonary disease) ([place]) 04/22/2013   CORONARY ARTERY DISEASE 09/03/2007   DEGENERATIVE JOINT DISEASE 01/30/2008   DIABETES MELLITUS 01/30/2008   Diabetic peripheral neuropathy ([place]) 04/22/2013   Eczema    GERD 09/03/2007   GLUCOSE INTOLERANCE 09/03/2007   GOITER, MULTINODULAR 05/21/2010   History of blood transfusion    HYPERCHOLESTEROLEMIA 09/03/2007   HYPERTENSION 09/03/2007   Impaired glucose tolerance 04/10/2011   LVH (left ventricular hypertrophy)    OBESITY 09/03/2007   Osteopenia    Psoriasis    Shortness of breath dyspnea    With exertion and allergies to pollen   Past Surgical History:  Procedure Laterality Date   ABDOMINAL HYSTERECTOMY     CARDIAC CATHETERIZATION     COLON SURGERY     FLOOR OF MOUTH BIOPSY N/A 12/24/2015   Procedure: ORAL BIOPSY;  Surgeon: Izora Gala, MD;  Location: [facility];  Service: ENT;  Laterality: N/A;   HEMICOLECTOMY  12/1985   cancer   HERNIA REPAIR     MAXILLECTOMY Right 01/22/2016   Procedure: RIGHT SIDE PARTIAL MAXILLECTOMY;  Surgeon: Izora Gala, MD;  Location: [facility];  Service: ENT;  Laterality: Right;   TUBAL LIGATION      reports that she quit smoking about 44 years ago. Her smoking use included cigarettes. She has a 15.00 pack-year smoking history. She has never used smokeless tobacco.  She reports that she does not drink alcohol and does not use drugs. family history includes Cancer in her sister and another family member; Colon cancer in her paternal aunt and another family member; Heart disease in her mother; Liver disease in her father; Thyroid disease in her brother and sister. Allergies  Allergen Reactions   Simvastatin Other (See Comments)    Muscle spasms   Current Outpatient Medications on File Prior to Visit  Medication Sig Dispense Refill   amLODipine (NORVASC) 5 MG tablet TAKE 1 TABLET (5 MG TOTAL) BY MOUTH DAILY. 90 tablet 0   aspirin EC 81 MG tablet Take 1 tablet (81 mg  total) by mouth daily. 90 tablet 11   cholecalciferol (VITAMIN D3) 25 MCG (1000 UNIT) tablet Take 1,000 Units by mouth daily. Twice a week     clobetasol (TEMOVATE) 0.05 % ointment Use twice once daily to hand and feet as needed (Patient taking differently: Apply 1 application  topically 2 (two) times daily. Use twice once daily to hand and feet as needed) 30 g 1   FLUAD QUADRIVALENT 0.5 ML injection      hydrochlorothiazide (HYDRODIURIL) 25 MG tablet Take 1 tablet (25 mg total) by mouth daily. 90 tablet 1   iron polysaccharides (NIFEREX) 150 MG capsule TAKE 1 CAPSULE BY MOUTH EVERY DAY 90 capsule 1   Lancets (ONETOUCH DELICA PLUS [id number]) MISC USE TO CHECK BLOOD SUGAR ONCE DAILY 100 each 1   lovastatin (MEVACOR) 40 MG tablet TAKE 2 TABLETS BY MOUTH DAILY. (Patient taking differently: Take 40 mg by mouth. TAKE 1TABLETS BY MOUTH DAILY.) 180 tablet 3   methimazole (TAPAZOLE) 5 MG tablet Take 1 tablet (5 mg total) by mouth as directed. 1 tablet Monday through today (skip Sundays) 72 tablet 3   metoprolol tartrate (LOPRESSOR) 25 MG tablet TAKE 1 & 1/2 TABLETS TWICE A DAY 270 tablet 3   omeprazole (PRILOSEC) 20 MG capsule TAKE 1 CAPSULE BY MOUTH EVERY DAY 90 capsule 1   ONETOUCH ULTRA test strip USE TO CHECK BLOOD SUGARS DAILY. ANNUAL APPT DUE IN JULY MUST SEE PROVIDER FOR FUTURE REFILLS 100 strip 0   SPIKEVAX syringe      traMADol (ULTRAM) 50 MG tablet Take 1 tablet (50 mg total) by mouth every 6 (six) hours as needed. for pain 120 tablet 5   vitamin B-12 (CYANOCOBALAMIN) 1000 MCG tablet Take 1 tablet (1,000 mcg total) by mouth every other day. (Patient taking differently: Take 1,000 mcg by mouth every other day. Twice  a week) 45 tablet 3   Blood Glucose Monitoring Suppl (ONE TOUCH ULTRA SYSTEM KIT) W/DEVICE KIT 1 kit by Does not apply route once. 250.02 (Patient not taking: Reported on 01/06/2023) 1 each 0   tiZANidine (ZANAFLEX) 4 MG tablet TAKE 1 TABLET BY MOUTH EVERY 6 HOURS AS NEEDED FOR MUSCLE  SPASMS. (Patient not taking: Reported on 01/06/2023) 360 tablet 1   No current facility-administered medications on file prior to visit.        ROS:  All others reviewed and negative.  Objective        PE:  BP 128/72 (BP Location: Right Arm, Patient Position: Sitting, Cuff Size: Large)   Pulse (!) 115   Temp 97.6 F (36.4 C) (Oral)   Ht '5\' 3"'$  (1.6 m)   Wt 115 lb (52.2 kg)   SpO2 99%   BMI 20.37 kg/m                 Constitutional: Pt appears  in NAD               HENT: Head: NCAT.                Right Ear: External ear normal.                 Left Ear: External ear normal.                Eyes: . Pupils are equal, round, and reactive to light. Conjunctivae and EOM are normal               Nose: without d/c or deformity               Neck: Neck supple. Gross normal ROM               Cardiovascular: Normal rate and regular rhythm.                 Pulmonary/Chest: Effort normal and breath sounds without rales or wheezing.                Abd:  Soft, NT, ND, + BS, no organomegaly               Neurological: Pt is alert. At baseline orientation, motor grossly intact               Skin: Skin is warm. No rashes, no other new lesions, LE edema - none               Psychiatric: Pt behavior is normal without agitation   Micro: none  Cardiac tracings I have personally interpreted today:  ECG - sinus bradycarida 54, LVH  Pertinent Radiological findings (summarize): none   Lab Results  Component Value Date   WBC 6.0 12/29/2022   HGB 12.6 12/29/2022   HCT 37.6 12/29/2022   PLT 248.0 12/29/2022   GLUCOSE 83 12/29/2022   CHOL 167 12/29/2022   TRIG 89.0 12/29/2022   HDL 64.50 12/29/2022   LDLCALC 85 12/29/2022   ALT 15 12/29/2022   AST 21 12/29/2022   NA 140 12/29/2022   K 4.0 12/29/2022   CL 101 12/29/2022   CREATININE 0.78 12/29/2022   BUN 10 12/29/2022   CO2 33 (H) 12/29/2022   TSH 6.73 (H) 09/02/2022   INR 1.04 02/08/2016   HGBA1C 4.8 12/29/2022   MICROALBUR 2.7 (H)  12/29/2022   Assessment/Plan:  [name] [name] is a 77 y.o. Black or African American [2] female with  has a past medical history of ARTHRITIS (01/30/2008), ASTHMA (09/03/2007), Cancer ([place]) (6295), COLON CANCER (04/07/2008), COPD (09/03/2007), COPD (chronic obstructive pulmonary disease) ([place]) (04/22/2013), CORONARY ARTERY DISEASE (09/03/2007), DEGENERATIVE JOINT DISEASE (01/30/2008), DIABETES MELLITUS (01/30/2008), Diabetic peripheral neuropathy ([place]) (04/22/2013), Eczema, GERD (09/03/2007), GLUCOSE INTOLERANCE (09/03/2007), GOITER, MULTINODULAR (05/21/2010), History of blood transfusion, HYPERCHOLESTEROLEMIA (09/03/2007), HYPERTENSION (09/03/2007), Impaired glucose tolerance (04/10/2011), LVH (left ventricular hypertrophy), OBESITY (09/03/2007), Osteopenia, Psoriasis, and Shortness of breath dyspnea.  Encounter for well adult exam with abnormal findings Age and sex appropriate education and counseling updated with regular exercise and diet Referrals for preventative services - none needed Immunizations addressed - declines covid booster and shingrix Smoking counseling  - none needed Evidence for depression or other mood disorder - none significant Most recent labs reviewed. I have personally reviewed and have noted: 1) the patient's medical and social history 2) The patient's current medications and supplements 3) The patient's height, weight, and BMI have been recorded in the chart  Anemia Lab Results  Component Value Date   WBC 6.0 12/29/2022   HGB 12.6 12/29/2022   HCT 37.6 12/29/2022   MCV 81.6 12/29/2022   PLT 248.0 12/29/2022   Resolved, cont to follow  B12 deficiency Lab Results  Component Value Date   VITAMINB12 1,394 (H) 12/29/2022   Stable, cont oral replacement - b12 1000 mcg qd   Diabetes mellitus with neuropathy Lab Results  Component Value Date   HGBA1C 4.8 12/29/2022   Stable, pt to continue current medical treatment  - diet, wt control   Essential hypertension BP  Readings from Last 3 Encounters:  01/06/23 128/72  09/02/22 114/68  07/06/22 136/70   Stable, pt to continue medical treatment norvasc 5 mg qd, hct 25 qd, lopressor 25 mg 1.5 bid   HYPERCHOLESTEROLEMIA Lab Results  Component Value Date   LDLCALC 85 12/29/2022   Stable, pt to continue current statin lovastatin 80 mg qd   Tachycardia Found on arrival, ECG reviewed with sinus brady 54, etiology unclear, pt asympt, ok to follow, no evidence for afib, consider card event monitor for symptoms  Vitamin D deficiency Last vitamin D Lab Results  Component Value Date   VD25OH 67.51 12/29/2022   Stable, cont oral replacement  Followup: Return in about 6 months (around 07/07/2023).  Cathlean Cower, MD 01/06/2023 1:22 PM [facility]

## 2023-01-06 NOTE — Assessment & Plan Note (Signed)
Age and sex appropriate education and counseling updated with regular exercise and diet Referrals for preventative services - none needed Immunizations addressed - declines covid booster and shingrix Smoking counseling  - none needed Evidence for depression or other mood disorder - none significant Most recent labs reviewed. I have personally reviewed and have noted: 1) the patient's medical and social history 2) The patient's current medications and supplements 3) The patient's height, weight, and BMI have been recorded in the chart

## 2023-01-06 NOTE — Assessment & Plan Note (Signed)
Last vitamin D Lab Results  Component Value Date   VD25OH 67.51 12/29/2022   Stable, cont oral replacement

## 2023-01-06 NOTE — Assessment & Plan Note (Signed)
Lab Results  Component Value Date   VITAMINB12 5,051 (H) 12/29/2022   Stable, cont oral replacement - b12 1000 mcg qd

## 2023-01-06 NOTE — Assessment & Plan Note (Addendum)
Found on arrival, ECG reviewed with sinus brady 54, etiology unclear, pt asympt, ok to follow, no evidence for afib, consider card event monitor for symptoms

## 2023-01-06 NOTE — Assessment & Plan Note (Signed)
Lab Results  Component Value Date   HGBA1C 4.8 12/29/2022   Stable, pt to continue current medical treatment  - diet, wt control

## 2023-01-06 NOTE — Assessment & Plan Note (Signed)
Lab Results  Component Value Date   LDLCALC 85 12/29/2022   Stable, pt to continue current statin lovastatin 80 mg qd

## 2023-01-20 DIAGNOSIS — Z8589 Personal history of malignant neoplasm of other organs and systems: Secondary | ICD-10-CM | POA: Diagnosis not present

## 2023-01-20 DIAGNOSIS — Z9221 Personal history of antineoplastic chemotherapy: Secondary | ICD-10-CM | POA: Diagnosis not present

## 2023-01-20 DIAGNOSIS — R911 Solitary pulmonary nodule: Secondary | ICD-10-CM | POA: Diagnosis not present

## 2023-01-20 DIAGNOSIS — Z923 Personal history of irradiation: Secondary | ICD-10-CM | POA: Diagnosis not present

## 2023-01-20 DIAGNOSIS — C310 Malignant neoplasm of maxillary sinus: Secondary | ICD-10-CM | POA: Diagnosis not present

## 2023-01-20 DIAGNOSIS — C760 Malignant neoplasm of head, face and neck: Secondary | ICD-10-CM | POA: Diagnosis not present

## 2023-01-20 DIAGNOSIS — Z08 Encounter for follow-up examination after completed treatment for malignant neoplasm: Secondary | ICD-10-CM | POA: Diagnosis not present

## 2023-01-20 DIAGNOSIS — Z8522 Personal history of malignant neoplasm of nasal cavities, middle ear, and accessory sinuses: Secondary | ICD-10-CM | POA: Diagnosis not present

## 2023-01-20 DIAGNOSIS — R918 Other nonspecific abnormal finding of lung field: Secondary | ICD-10-CM | POA: Diagnosis not present

## 2023-01-27 ENCOUNTER — Other Ambulatory Visit: Payer: Self-pay | Admitting: Internal Medicine

## 2023-01-27 DIAGNOSIS — I10 Essential (primary) hypertension: Secondary | ICD-10-CM

## 2023-01-27 NOTE — Telephone Encounter (Signed)
Please refill as per office routine med refill policy (all routine meds to be refilled for 3 mo or monthly (per pt preference) up to one year from last visit, then month to month grace period for 3 mo, then further med refills will have to be denied) ? ?

## 2023-02-20 DIAGNOSIS — H5340 Unspecified visual field defects: Secondary | ICD-10-CM | POA: Diagnosis not present

## 2023-02-20 DIAGNOSIS — H401131 Primary open-angle glaucoma, bilateral, mild stage: Secondary | ICD-10-CM | POA: Diagnosis not present

## 2023-02-21 ENCOUNTER — Other Ambulatory Visit: Payer: Self-pay | Admitting: Internal Medicine

## 2023-02-24 ENCOUNTER — Telehealth: Payer: Self-pay | Admitting: Internal Medicine

## 2023-02-24 DIAGNOSIS — I10 Essential (primary) hypertension: Secondary | ICD-10-CM

## 2023-02-24 NOTE — Telephone Encounter (Signed)
Prescription Request  02/24/2023  LOV: 01/06/2023  What is the name of the medication or equipment? amlodopine  Have you contacted your pharmacy to request a refill? Yes   Which pharmacy would you like this sent to?  [id number] - [place], [place] - [address] [place] [address] Phone: [phone] Fax: [phone]    Patient notified that their request is being sent to the clinical staff for review and that they should receive a response within 2 business days.   Please advise at Mobile There is no such number on file (mobile).

## 2023-02-27 ENCOUNTER — Other Ambulatory Visit: Payer: Self-pay

## 2023-02-27 MED ORDER — METOPROLOL TARTRATE 25 MG PO TABS: ORAL_TABLET | ORAL | 1 refills | Status: DC

## 2023-02-27 MED ORDER — AMLODIPINE BESYLATE 5 MG PO TABS: 5.0000 mg | ORAL_TABLET | Freq: Every day | ORAL | 3 refills | Status: DC

## 2023-02-27 NOTE — Telephone Encounter (Signed)
Rx sent 

## 2023-03-08 ENCOUNTER — Ambulatory Visit: Payer: Medicare Other | Admitting: Internal Medicine

## 2023-03-08 DIAGNOSIS — C069 Malignant neoplasm of mouth, unspecified: Secondary | ICD-10-CM | POA: Diagnosis not present

## 2023-03-08 DIAGNOSIS — C310 Malignant neoplasm of maxillary sinus: Secondary | ICD-10-CM | POA: Diagnosis not present

## 2023-03-16 ENCOUNTER — Encounter: Payer: Self-pay | Admitting: Internal Medicine

## 2023-03-16 ENCOUNTER — Telehealth: Payer: Self-pay | Admitting: Internal Medicine

## 2023-03-16 ENCOUNTER — Ambulatory Visit (INDEPENDENT_AMBULATORY_CARE_PROVIDER_SITE_OTHER): Payer: Medicare Other | Admitting: Internal Medicine

## 2023-03-16 VITALS — BP 128/70 | HR 62 | Ht 63.0 in | Wt 115.0 lb

## 2023-03-16 DIAGNOSIS — E0590 Thyrotoxicosis, unspecified without thyrotoxic crisis or storm: Secondary | ICD-10-CM | POA: Diagnosis not present

## 2023-03-16 DIAGNOSIS — E042 Nontoxic multinodular goiter: Secondary | ICD-10-CM

## 2023-03-16 LAB — TSH: TSH: 5.47 u[IU]/mL (ref 0.35–5.50)

## 2023-03-16 LAB — T4, FREE: Free T4: 0.81 ng/dL (ref 0.60–1.60)

## 2023-03-16 MED ORDER — METHIMAZOLE 5 MG PO TABS: 5.0000 mg | ORAL_TABLET | ORAL | 3 refills | Status: DC

## 2023-03-16 NOTE — Telephone Encounter (Signed)
Pt contacted and advised: Please let the patient know that her thyroid function is going in the right direction   Needs to reduce methimazole by taking 1 tablet Monday, Tuesday, Wednesday, and Thursday   None on Friday, Saturday, or Sunday

## 2023-03-16 NOTE — Progress Notes (Signed)
Name: [name] [name]  MRN/ DOB: [id number], [birth year]    Age/ Sex: 77 y.o., female     PCP: Biagio Borg, MD   Reason for Endocrinology Evaluation: Hyperthyroidism/MNG     Initial Endocrinology Clinic Visit: 04/03/2013    PATIENT IDENTIFIER: [name] [name] is a 77 y.o., female with a past medical history of HTN, CAD, hyperthyroidism, asthma and DM. She has followed with [facility] since 04/03/2013 for consultative assistance with management of her Hyperthyroidism/MNG.   HISTORICAL SUMMARY: The patient was first diagnosed with multinodular goiter and hyperthyroid in 2011.  She was not started on methimazole until 2016.  She had declined radioactive iodine ablation in the past by her previous endocrinologist note   Of note she has had recurrent adenocarcinoma of the maxillary sinus that she was initially diagnosed with in 2016.  None of the thyroid nodules has met criteria in the past for FNA, with the last thyroid ultrasound in 2014 showed stability of the nodules   She was followed by Dr. Loanne Drilling from 2014 until March 2023  SUBJECTIVE:    Today (03/16/2023):  [name] [name] is here for hyper thyroidism and multinodular goiter.  She is S/P right orbitotomy with resection of intra-orbital tumor 11/2022 due to adenocarcinoma of maxillary sinus with recurrence   She continues to follow up with sx and radio oncologist   Had cataract  07/2022, has left eye glaucoma  Does not use a pill box   Weight has been stable   Denies local neck swelling Denies palpitations Has mild occasional constipation but no diarrhea  Denies tremors  There are times where she feels tired     Methimazole 5 mg , 1 tablet Monday through Saturday   HISTORY:  Past Medical History:  Past Medical History:  Diagnosis Date   ARTHRITIS 01/30/2008   ASTHMA 09/03/2007   Cancer 1987   palate cancer   COLON CANCER 04/07/2008   pt states she had colon cancer 1987   COPD 09/03/2007   COPD  (chronic obstructive pulmonary disease) 04/22/2013   CORONARY ARTERY DISEASE 09/03/2007   DEGENERATIVE JOINT DISEASE 01/30/2008   DIABETES MELLITUS 01/30/2008   Diabetic peripheral neuropathy 04/22/2013   Eczema    GERD 09/03/2007   GLUCOSE INTOLERANCE 09/03/2007   GOITER, MULTINODULAR 05/21/2010   History of blood transfusion    HYPERCHOLESTEROLEMIA 09/03/2007   HYPERTENSION 09/03/2007   Impaired glucose tolerance 04/10/2011   LVH (left ventricular hypertrophy)    OBESITY 09/03/2007   Osteopenia    Psoriasis    Shortness of breath dyspnea    With exertion and allergies to pollen   Past Surgical History:  Past Surgical History:  Procedure Laterality Date   ABDOMINAL HYSTERECTOMY     CARDIAC CATHETERIZATION     COLON SURGERY     FLOOR OF MOUTH BIOPSY N/A 12/24/2015   Procedure: ORAL BIOPSY;  Surgeon: Izora Gala, MD;  Location: [facility] OR;  Service: ENT;  Laterality: N/A;   HEMICOLECTOMY  12/1985   cancer   HERNIA REPAIR     MAXILLECTOMY Right 01/22/2016   Procedure: RIGHT SIDE PARTIAL MAXILLECTOMY;  Surgeon: Izora Gala, MD;  Location: [facility];  Service: ENT;  Laterality: Right;   TUBAL LIGATION     Social History:  reports that she quit smoking about 44 years ago. Her smoking use included cigarettes. She has a 15.00 pack-year smoking history. She has never used smokeless tobacco. She reports that she does not drink alcohol and does not  use drugs. Family History:  Family History  Problem Relation Age of Onset   Liver disease Father    Heart disease Mother    Colon cancer Paternal Aunt    Thyroid disease Sister        overactive   Cancer Sister        lung, adrenal gland   Thyroid disease Brother        overactive   Colon cancer Other        aunt   Cancer Other        Colon Cancer-Aunt   Breast cancer Neg Hx    Esophageal cancer Neg Hx    Rectal cancer Neg Hx    Stomach cancer Neg Hx      HOME MEDICATIONS: Allergies as of 03/16/2023       Reactions   Simvastatin Other (See  Comments)   Muscle spasms        Medication List        Accurate as of March 16, 2023 10:51 AM. If you have any questions, ask your nurse or doctor.          amLODipine 5 MG tablet Commonly known as: NORVASC Take 1 tablet (5 mg total) by mouth daily.   aspirin EC 81 MG tablet Take 1 tablet (81 mg total) by mouth daily.   cholecalciferol 25 MCG (1000 UNIT) tablet Commonly known as: VITAMIN D3 Take 1,000 Units by mouth daily. Twice a week   clobetasol ointment 0.05 % Commonly known as: TEMOVATE Use twice once daily to hand and feet as needed What changed:  how much to take how to take this when to take this   cyanocobalamin 1000 MCG tablet Commonly known as: VITAMIN B12 Take 1 tablet (1,000 mcg total) by mouth every other day. What changed: additional instructions   Fluad Quadrivalent 0.5 ML injection Generic drug: influenza vaccine adjuvanted   hydrochlorothiazide 25 MG tablet Commonly known as: HYDRODIURIL TAKE 1 TABLET (25 MG TOTAL) BY MOUTH DAILY.   iron polysaccharides 150 MG capsule Commonly known as: NIFEREX TAKE 1 CAPSULE BY MOUTH EVERY DAY   lovastatin 40 MG tablet Commonly known as: MEVACOR TAKE 2 TABLETS BY MOUTH DAILY. What changed:  how much to take how to take this additional instructions   methimazole 5 MG tablet Commonly known as: TAPAZOLE Take 1 tablet (5 mg total) by mouth as directed. 1 tablet Monday through today (skip Sundays)   metoprolol tartrate 25 MG tablet Commonly known as: LOPRESSOR TAKE 1 & 1/2 TABLETS TWICE A DAY   omeprazole 20 MG capsule Commonly known as: PRILOSEC TAKE 1 CAPSULE BY MOUTH EVERY DAY   ONE TOUCH ULTRA SYSTEM KIT w/Device Kit 1 kit by Does not apply route once. [id number]   OneTouch Delica Plus 123456 Misc USE TO CHECK BLOOD SUGAR ONCE DAILY   OneTouch Ultra test strip Generic drug: glucose blood USE TO CHECK BLOOD SUGARS DAILY. ANNUAL APPT DUE IN JULY MUST SEE PROVIDER FOR FUTURE REFILLS    Spikevax syringe Generic drug: COVID-19 mRNA vaccine 2023-2024   tiZANidine 4 MG tablet Commonly known as: ZANAFLEX TAKE 1 TABLET BY MOUTH EVERY 6 HOURS AS NEEDED FOR MUSCLE SPASMS.   traMADol 50 MG tablet Commonly known as: ULTRAM Take 1 tablet (50 mg total) by mouth every 6 (six) hours as needed. for pain          OBJECTIVE:   PHYSICAL EXAM: VS: BP 128/70 (BP Location: Right Arm, Patient Position: Sitting, Cuff Size: Normal)  Pulse 62   Ht 5\' 3"  (1.6 m)   Wt 115 lb (52.2 kg)   SpO2 96%   BMI 20.37 kg/m    EXAM: General: Pt appears well and is in NAD  Neck: General: Supple without adenopathy. Thyroid: Thyroid size normal.  No goiter or nodules appreciated.   Lungs: Clear with good BS bilat   Heart: Auscultation: RRR.  Abdomen: soft, nontender  Extremities:  BL LE: No pretibial edema   Mental Status: Judgment, insight: Intact Orientation: Oriented to time, place, and person Mood and affect: No depression, anxiety, or agitation     DATA REVIEWED:  Latest Reference Range & Units 03/16/23 11:02  TSH 0.35 - 5.50 uIU/mL 5.47  T4,Free(Direct) 0.60 - 1.60 ng/dL 0.81      Latest Reference Range & Units 06/29/22 08:41  Sodium 135 - 145 mEq/L 138  Potassium 3.5 - 5.1 mEq/L 3.8  Chloride 96 - 112 mEq/L 98  CO2 19 - 32 mEq/L 32  Glucose 70 - 99 mg/dL 80  BUN 6 - 23 mg/dL 13  Creatinine 0.40 - 1.20 mg/dL 0.78  Calcium 8.4 - 10.5 mg/dL 9.4  Alkaline Phosphatase 39 - 117 U/L 62  Albumin 3.5 - 5.2 g/dL 4.3  AST 0 - 37 U/L 20  ALT 0 - 35 U/L 14  Total Protein 6.0 - 8.3 g/dL 7.0  Bilirubin, Direct 0.0 - 0.3 mg/dL 0.2  Total Bilirubin 0.2 - 1.2 mg/dL 0.9  GFR >60.00 mL/min 73.57      ASSESSMENT / PLAN / RECOMMENDATIONS:   Hyperthyroidism  -Patient is clinically euthyroid -TSH is elevated, will reduce methimazole as below   Medications   Decrease methimazole 5 mg, 1 tablet Monday through Thursday (skip Friday, Saturday and  Sundays)     2.MNG:  -No local neck symptoms -We will continue to monitor clinically -Last ultrasound in 2014 showed stability over the prior 3 years   Follow-up in 6 months  Signed electronically by: Mack Guise, MD  [facility]  [facility] [address] [place], [place] [address] Phone: [phone] FAX: [phone]      CC: [address], [place] [address] Phone: [phone]  Fax: [phone]   Return to Endocrinology clinic as below: Future Appointments  Date Time Provider [place]  07/05/2023 10:20 AM Biagio Borg, MD [id number] None

## 2023-03-16 NOTE — Telephone Encounter (Signed)
Please let the patient know that her thyroid function is going in the right direction  Needs to reduce methimazole by taking 1 tablet Monday, Tuesday, Wednesday, and Thursday  None on Friday, Saturday, or Sunday    Thanks

## 2023-03-17 LAB — T3: T3, Total: 96 ng/dL (ref 76–181)

## 2023-04-19 ENCOUNTER — Other Ambulatory Visit: Payer: Self-pay | Admitting: Internal Medicine

## 2023-04-21 ENCOUNTER — Other Ambulatory Visit: Payer: Self-pay

## 2023-05-15 ENCOUNTER — Other Ambulatory Visit: Payer: Self-pay | Admitting: Internal Medicine

## 2023-05-25 ENCOUNTER — Encounter: Payer: Self-pay | Admitting: Internal Medicine

## 2023-06-19 DIAGNOSIS — H401121 Primary open-angle glaucoma, left eye, mild stage: Secondary | ICD-10-CM | POA: Diagnosis not present

## 2023-06-19 DIAGNOSIS — H04123 Dry eye syndrome of bilateral lacrimal glands: Secondary | ICD-10-CM | POA: Diagnosis not present

## 2023-06-20 ENCOUNTER — Encounter: Payer: Self-pay | Admitting: Internal Medicine

## 2023-06-26 ENCOUNTER — Other Ambulatory Visit: Payer: Self-pay | Admitting: Internal Medicine

## 2023-06-26 ENCOUNTER — Other Ambulatory Visit (INDEPENDENT_AMBULATORY_CARE_PROVIDER_SITE_OTHER): Payer: Medicare Other

## 2023-06-26 DIAGNOSIS — C310 Malignant neoplasm of maxillary sinus: Secondary | ICD-10-CM

## 2023-06-26 DIAGNOSIS — E559 Vitamin D deficiency, unspecified: Secondary | ICD-10-CM

## 2023-06-26 DIAGNOSIS — E538 Deficiency of other specified B group vitamins: Secondary | ICD-10-CM

## 2023-06-26 DIAGNOSIS — E1140 Type 2 diabetes mellitus with diabetic neuropathy, unspecified: Secondary | ICD-10-CM

## 2023-06-26 LAB — HEPATIC FUNCTION PANEL
ALT: 13 U/L (ref 0–35)
AST: 20 U/L (ref 0–37)
Albumin: 4 g/dL (ref 3.5–5.2)
Alkaline Phosphatase: 61 U/L (ref 39–117)
Bilirubin, Direct: 0.2 mg/dL (ref 0.0–0.3)
Total Bilirubin: 0.8 mg/dL (ref 0.2–1.2)
Total Protein: 7 g/dL (ref 6.0–8.3)

## 2023-06-26 LAB — CBC WITH DIFFERENTIAL/PLATELET
Basophils Absolute: 0 10*3/uL (ref 0.0–0.1)
Basophils Relative: 0.4 % (ref 0.0–3.0)
Eosinophils Absolute: 0.2 10*3/uL (ref 0.0–0.7)
Eosinophils Relative: 2.8 % (ref 0.0–5.0)
HCT: 39.2 % (ref 36.0–46.0)
Hemoglobin: 12.6 g/dL (ref 12.0–15.0)
Lymphocytes Relative: 17.9 % (ref 12.0–46.0)
Lymphs Abs: 1 10*3/uL (ref 0.7–4.0)
MCHC: 32.1 g/dL (ref 30.0–36.0)
MCV: 81 fl (ref 78.0–100.0)
Monocytes Absolute: 0.4 10*3/uL (ref 0.1–1.0)
Monocytes Relative: 8.2 % (ref 3.0–12.0)
Neutro Abs: 3.8 10*3/uL (ref 1.4–7.7)
Neutrophils Relative %: 70.7 % (ref 43.0–77.0)
Platelets: 207 10*3/uL (ref 150.0–400.0)
RBC: 4.84 Mil/uL (ref 3.87–5.11)
RDW: 15.8 % — ABNORMAL HIGH (ref 11.5–15.5)
WBC: 5.4 10*3/uL (ref 4.0–10.5)

## 2023-06-26 LAB — TSH: TSH: 2.99 u[IU]/mL (ref 0.35–5.50)

## 2023-06-26 LAB — LIPID PANEL
Cholesterol: 148 mg/dL (ref 0–200)
HDL: 65.5 mg/dL (ref >39.00)
LDL Cholesterol: 70 mg/dL (ref 0–99)
NonHDL: 82.26
Total CHOL/HDL Ratio: 2
Triglycerides: 62 mg/dL (ref 0.0–149.0)
VLDL: 12.4 mg/dL (ref 0.0–40.0)

## 2023-06-26 LAB — VITAMIN B12: Vitamin B-12: 543 pg/mL (ref 211–911)

## 2023-06-26 LAB — VITAMIN D 25 HYDROXY (VIT D DEFICIENCY, FRACTURES): VITD: 55.46 ng/mL (ref 30.00–100.00)

## 2023-06-27 LAB — HEMOGLOBIN A1C
Hgb A1c MFr Bld: 4.8 % of total Hgb (ref <5.7)
Mean Plasma Glucose: 91 mg/dL
eAG (mmol/L): 5 mmol/L

## 2023-06-27 NOTE — Progress Notes (Signed)
The test results show that your current treatment is OK, as the tests are stable.  Please continue the same plan.  There is no other need for change of treatment or further evaluation based on these results, at this time.  thanks 

## 2023-07-05 ENCOUNTER — Ambulatory Visit (INDEPENDENT_AMBULATORY_CARE_PROVIDER_SITE_OTHER): Payer: Medicare Other | Admitting: Internal Medicine

## 2023-07-05 ENCOUNTER — Encounter: Payer: Self-pay | Admitting: Internal Medicine

## 2023-07-05 VITALS — BP 120/72 | HR 56 | Temp 97.7°F | Ht 63.0 in | Wt 111.0 lb

## 2023-07-05 DIAGNOSIS — M2041 Other hammer toe(s) (acquired), right foot: Secondary | ICD-10-CM

## 2023-07-05 DIAGNOSIS — E559 Vitamin D deficiency, unspecified: Secondary | ICD-10-CM | POA: Diagnosis not present

## 2023-07-05 DIAGNOSIS — E538 Deficiency of other specified B group vitamins: Secondary | ICD-10-CM | POA: Diagnosis not present

## 2023-07-05 DIAGNOSIS — E7800 Pure hypercholesterolemia, unspecified: Secondary | ICD-10-CM

## 2023-07-05 DIAGNOSIS — E1140 Type 2 diabetes mellitus with diabetic neuropathy, unspecified: Secondary | ICD-10-CM

## 2023-07-05 DIAGNOSIS — I10 Essential (primary) hypertension: Secondary | ICD-10-CM

## 2023-07-05 DIAGNOSIS — Z1211 Encounter for screening for malignant neoplasm of colon: Secondary | ICD-10-CM

## 2023-07-05 NOTE — Patient Instructions (Addendum)
Please have your Shingrix (shingles) shots done at your local pharmacy.  You will be contacted regarding the referral for: podiatry, and colonoscopy  Ok to restart the B12 supplement at twice per wk to keep this up  Please continue all other medications as before, and refills have been done if requested.  Please have the pharmacy call with any other refills you may need.  Please continue your efforts at being more active, low cholesterol diet, and weight control.  Please keep your appointments with your specialists as you may have planned  Please make an Appointment to return in 6 months, or sooner if needed, also with Lab Appointment for testing done 3-5 days before at the FIRST FLOOR Lab (so this is for TWO appointments - please see the scheduling desk as you leave)

## 2023-07-05 NOTE — Assessment & Plan Note (Signed)
Lab Results  Component Value Date   VITAMINB12 543 06/26/2023   Relatively low to baseline - for restart oral replacement - b12 1000 mcg at twice per week

## 2023-07-05 NOTE — Progress Notes (Addendum)
Patient ID: [name] [name], female   DOB: [birth year], 77 y.o.   MRN: [id number]        Chief Complaint: follow up right 4th toe pain, low b12 and vit d, dm, htn, hld       HPI:  [name] [name] is a 77 y.o. female here with c/o wrosening pain and callous to end of right 4th toe which itself is become cockedup.  Pt denies chest pain, increased sob or doe, wheezing, orthopnea, PND, increased LE swelling, palpitations, dizziness or syncope.   Pt denies polydipsia, polyuria, or new focal neuro s/s.    Pt denies fever, wt loss, night sweats, loss of appetite, or other constitutional symptoms   Has colonoscopy for aug 21 scheduled.    Wt Readings from Last 3 Encounters:  07/05/23 111 lb (50.3 kg)  03/16/23 115 lb (52.2 kg)  01/06/23 115 lb (52.2 kg)   BP Readings from Last 3 Encounters:  07/05/23 120/72  03/16/23 128/70  01/06/23 128/72         Past Medical History:  Diagnosis Date   ARTHRITIS 01/30/2008   ASTHMA 09/03/2007   Cancer (HCC) 1987   palate cancer   COLON CANCER 04/07/2008   pt states she had colon cancer 1987   COPD 09/03/2007   COPD (chronic obstructive pulmonary disease) (HCC) 04/22/2013   CORONARY ARTERY DISEASE 09/03/2007   DEGENERATIVE JOINT DISEASE 01/30/2008   DIABETES MELLITUS 01/30/2008   Diabetic peripheral neuropathy (HCC) 04/22/2013   Eczema    GERD 09/03/2007   GLUCOSE INTOLERANCE 09/03/2007   GOITER, MULTINODULAR 05/21/2010   History of blood transfusion    HYPERCHOLESTEROLEMIA 09/03/2007   HYPERTENSION 09/03/2007   Impaired glucose tolerance 04/10/2011   LVH (left ventricular hypertrophy)    OBESITY 09/03/2007   Osteopenia    Psoriasis    Shortness of breath dyspnea    With exertion and allergies to pollen   Past Surgical History:  Procedure Laterality Date   ABDOMINAL HYSTERECTOMY     CARDIAC CATHETERIZATION     COLON SURGERY     FLOOR OF MOUTH BIOPSY N/A 12/24/2015   Procedure: ORAL BIOPSY;  Surgeon: Serena Colonel, MD;  Location: [facility] OR;  Service: ENT;   Laterality: N/A;   HEMICOLECTOMY  12/1985   cancer   HERNIA REPAIR     MAXILLECTOMY Right 01/22/2016   Procedure: RIGHT SIDE PARTIAL MAXILLECTOMY;  Surgeon: Serena Colonel, MD;  Location: [facility] OR;  Service: ENT;  Laterality: Right;   TUBAL LIGATION      reports that she quit smoking about 44 years ago. Her smoking use included cigarettes. She started smoking about 59 years ago. She has a 15 pack-year smoking history. She has never used smokeless tobacco. She reports that she does not drink alcohol and does not use drugs. family history includes Cancer in her sister and another family member; Colon cancer in her paternal aunt and another family member; Heart disease in her mother; Liver disease in her father; Thyroid disease in her brother and sister. Allergies  Allergen Reactions   Simvastatin Other (See Comments)    Muscle spasms   Current Outpatient Medications on File Prior to Visit  Medication Sig Dispense Refill   amLODipine (NORVASC) 5 MG tablet Take 1 tablet (5 mg total) by mouth daily. 90 tablet 3   aspirin EC 81 MG tablet Take 1 tablet (81 mg total) by mouth daily. 90 tablet 11   Blood Glucose Monitoring Suppl (ONE TOUCH ULTRA SYSTEM KIT) W/DEVICE KIT 1  kit by Does not apply route once. 250.02 1 each 0   cholecalciferol (VITAMIN D3) 25 MCG (1000 UNIT) tablet Take 1,000 Units by mouth daily. Twice a week     clobetasol (TEMOVATE) 0.05 % ointment Use twice once daily to hand and feet as needed (Patient taking differently: Apply 1 application  topically 2 (two) times daily. Use twice once daily to hand and feet as needed) 30 g 1   FLUAD QUADRIVALENT 0.5 ML injection      hydrochlorothiazide (HYDRODIURIL) 25 MG tablet TAKE 1 TABLET (25 MG TOTAL) BY MOUTH DAILY. 90 tablet 2   iron polysaccharides (NIFEREX) 150 MG capsule TAKE 1 CAPSULE BY MOUTH EVERY DAY 90 capsule 1   Lancets (ONETOUCH DELICA PLUS LANCET30G) MISC USE TO CHECK BLOOD SUGAR ONCE DAILY 100 each 1   lovastatin (MEVACOR) 40 MG  tablet TAKE 2 TABLETS BY MOUTH DAILY 180 tablet 2   methimazole (TAPAZOLE) 5 MG tablet Take 1 tablet (5 mg total) by mouth as directed. 1 tablet Monday through Thursday (skip the other 3 days of the week) 48 tablet 3   metoprolol tartrate (LOPRESSOR) 25 MG tablet TAKE 1 & 1/2 TABLETS TWICE A DAY 270 tablet 1   omeprazole (PRILOSEC) 20 MG capsule TAKE 1 CAPSULE BY MOUTH EVERY DAY 90 capsule 1   ONETOUCH ULTRA test strip USE TO CHECK BLOOD SUGARS DAILY. ANNUAL APPT DUE IN JULY MUST SEE PROVIDER FOR FUTURE REFILLS 100 strip 0   SPIKEVAX syringe      tiZANidine (ZANAFLEX) 4 MG tablet TAKE 1 TABLET BY MOUTH EVERY 6 HOURS AS NEEDED FOR MUSCLE SPASMS. 360 tablet 1   traMADol (ULTRAM) 50 MG tablet TAKE 1 TABLET BY MOUTH EVERY 6 HOURS AS NEEDED. FOR PAIN 120 tablet 5   vitamin B-12 (CYANOCOBALAMIN) 1000 MCG tablet Take 1 tablet (1,000 mcg total) by mouth every other day. (Patient taking differently: Take 1,000 mcg by mouth every other day. Twice  a week) 45 tablet 3   No current facility-administered medications on file prior to visit.        ROS:  All others reviewed and negative.  Objective        PE:  BP 120/72 (BP Location: Right Arm, Patient Position: Sitting, Cuff Size: Normal)   Pulse (!) 56   Temp 97.7 F (36.5 C) (Oral)   Ht 5\' 3"  (1.6 m)   Wt 111 lb (50.3 kg)   SpO2 98%   BMI 19.66 kg/m                 Constitutional: Pt appears in NAD               HENT: Head: NCAT.                Right Ear: External ear normal.                 Left Ear: External ear normal.                Eyes: . Pupils are equal, round, and reactive to light. Conjunctivae and EOM are normal               Nose: without d/c or deformity               Neck: Neck supple. Gross normal ROM               Cardiovascular: Normal rate and regular rhythm.  Pulmonary/Chest: Effort normal and breath sounds without rales or wheezing.                Abd:  Soft, NT, ND, + BS, no organomegaly                Neurological: Pt is alert. At baseline orientation, motor grossly intact               Skin: Skin is warm. No rashes, no other new lesions, LE edema - none              Right 4th cockup toe with end of toe callous without ulceration               Psychiatric: Pt behavior is normal without agitation   Micro: none  Cardiac tracings I have personally interpreted today:  none  Pertinent Radiological findings (summarize): none   Lab Results  Component Value Date   WBC 5.4 06/26/2023   HGB 12.6 06/26/2023   HCT 39.2 06/26/2023   PLT 207.0 06/26/2023   GLUCOSE 83 12/29/2022   CHOL 148 06/26/2023   TRIG 62.0 06/26/2023   HDL 65.50 06/26/2023   LDLCALC 70 06/26/2023   ALT 13 06/26/2023   AST 20 06/26/2023   NA 140 12/29/2022   K 4.0 12/29/2022   CL 101 12/29/2022   CREATININE 0.78 12/29/2022   BUN 10 12/29/2022   CO2 33 (H) 12/29/2022   TSH 2.99 06/26/2023   INR 1.04 02/08/2016   HGBA1C 4.8 06/26/2023   MICROALBUR 2.7 (H) 12/29/2022   Assessment/Plan:  [name] [name] is a 77 y.o. Black or African American [2] female with  has a past medical history of ARTHRITIS (01/30/2008), ASTHMA (09/03/2007), Cancer (HCC) (9485), COLON CANCER (04/07/2008), COPD (09/03/2007), COPD (chronic obstructive pulmonary disease) (HCC) (04/22/2013), CORONARY ARTERY DISEASE (09/03/2007), DEGENERATIVE JOINT DISEASE (01/30/2008), DIABETES MELLITUS (01/30/2008), Diabetic peripheral neuropathy (HCC) (04/22/2013), Eczema, GERD (09/03/2007), GLUCOSE INTOLERANCE (09/03/2007), GOITER, MULTINODULAR (05/21/2010), History of blood transfusion, HYPERCHOLESTEROLEMIA (09/03/2007), HYPERTENSION (09/03/2007), Impaired glucose tolerance (04/10/2011), LVH (left ventricular hypertrophy), OBESITY (09/03/2007), Osteopenia, Psoriasis, and Shortness of breath dyspnea.  B12 deficiency Lab Results  Component Value Date   VITAMINB12 543 06/26/2023   Relatively low to baseline - for restart oral replacement - b12 1000 mcg at twice per  week  Diabetes mellitus with neuropathy Lab Results  Component Value Date   HGBA1C 4.8 06/26/2023   Stable, pt to continue current medical treatment  - diet, wt control   Essential hypertension BP Readings from Last 3 Encounters:  07/05/23 120/72  03/16/23 128/70  01/06/23 128/72   Stable, pt to continue medical treatment norvasc 5 every day, hct 25 every day, lopressor 37.5 mg bid   HYPERCHOLESTEROLEMIA Lab Results  Component Value Date   LDLCALC 70 06/26/2023   Uncontrolled, goal ldl < 70, pt to continue current statin lovastatin 40 mg every day as declines change for now, for lower chol diet  Vitamin D deficiency Last vitamin D Lab Results  Component Value Date   VD25OH 55.46 06/26/2023   Stable, cont oral replacement   Hammertoe of right foot Worsening with end toe callous and pain preulceratvie - for podiatry referral  Followup: Return in about 6 months (around 01/05/2024).  Oliver Barre, MD 07/05/2023 8:22 PM [facility]

## 2023-07-05 NOTE — Assessment & Plan Note (Signed)
Last vitamin D Lab Results  Component Value Date   VD25OH 55.46 06/26/2023   Stable, cont oral replacement

## 2023-07-05 NOTE — Assessment & Plan Note (Signed)
Lab Results  Component Value Date   LDLCALC 70 06/26/2023   Uncontrolled, goal ldl < 70, pt to continue current statin lovastatin 40 mg every day as declines change for now, for lower chol diet

## 2023-07-05 NOTE — Assessment & Plan Note (Signed)
BP Readings from Last 3 Encounters:  07/05/23 120/72  03/16/23 128/70  01/06/23 128/72   Stable, pt to continue medical treatment norvasc 5 every day, hct 25 every day, lopressor 37.5 mg bid

## 2023-07-05 NOTE — Assessment & Plan Note (Signed)
Lab Results  Component Value Date   HGBA1C 4.8 06/26/2023   Stable, pt to continue current medical treatment  - diet, wt control

## 2023-07-05 NOTE — Assessment & Plan Note (Signed)
Worsening with end toe callous and pain preulceratvie - for podiatry referral

## 2023-07-05 NOTE — Addendum Note (Signed)
Addended by: Corwin Levins on: 07/05/2023 08:22 PM   Modules accepted: Orders

## 2023-07-11 ENCOUNTER — Ambulatory Visit (AMBULATORY_SURGERY_CENTER): Payer: Medicare Other

## 2023-07-11 VITALS — Ht 61.0 in | Wt 114.0 lb

## 2023-07-11 DIAGNOSIS — Z85038 Personal history of other malignant neoplasm of large intestine: Secondary | ICD-10-CM

## 2023-07-11 DIAGNOSIS — Z86010 Personal history of colonic polyps: Secondary | ICD-10-CM

## 2023-07-11 MED ORDER — NA SULFATE-K SULFATE-MG SULF 17.5-3.13-1.6 GM/177ML PO SOLN
1.0000 | Freq: Once | ORAL | 0 refills | Status: AC
Start: 2023-07-11 — End: 2023-07-11

## 2023-07-11 NOTE — Progress Notes (Signed)
No egg or soy allergy known to patient  No issues known to pt with past sedation with any surgeries or procedures Patient denies ever being told they had issues or difficulty with intubation  No FH of Malignant Hyperthermia Pt is not on diet pills Pt is not on  home 02  Pt is not on blood thinners  Pt denies issues with constipation  No A fib or A flutter Have any cardiac testing pending--no Pt can ambulate independently Pt denies use of chewing tobacco Discussed diabetic I weight loss medication holds Discussed NSAID holds Checked BMI Pt instructed to use [employer] or GoodRx for a price reduction on prep  Patient's chart reviewed by [name] [name] CNRA prior to previsit and patient appropriate for the [facility].  Pre visit completed and red dot placed by patient's name on their procedure day (on provider's schedule).

## 2023-07-24 DIAGNOSIS — C310 Malignant neoplasm of maxillary sinus: Secondary | ICD-10-CM | POA: Diagnosis not present

## 2023-07-24 DIAGNOSIS — Z923 Personal history of irradiation: Secondary | ICD-10-CM | POA: Diagnosis not present

## 2023-07-24 DIAGNOSIS — C760 Malignant neoplasm of head, face and neck: Secondary | ICD-10-CM | POA: Diagnosis not present

## 2023-07-24 DIAGNOSIS — R918 Other nonspecific abnormal finding of lung field: Secondary | ICD-10-CM | POA: Diagnosis not present

## 2023-08-02 ENCOUNTER — Ambulatory Visit (AMBULATORY_SURGERY_CENTER): Payer: Medicare Other | Admitting: Internal Medicine

## 2023-08-02 ENCOUNTER — Encounter: Payer: Self-pay | Admitting: Internal Medicine

## 2023-08-02 VITALS — BP 117/63 | HR 75 | Temp 96.8°F | Resp 13 | Ht 61.0 in | Wt 114.0 lb

## 2023-08-02 DIAGNOSIS — Z85038 Personal history of other malignant neoplasm of large intestine: Secondary | ICD-10-CM

## 2023-08-02 DIAGNOSIS — Z08 Encounter for follow-up examination after completed treatment for malignant neoplasm: Secondary | ICD-10-CM | POA: Diagnosis not present

## 2023-08-02 DIAGNOSIS — Z86010 Personal history of colonic polyps: Secondary | ICD-10-CM

## 2023-08-02 DIAGNOSIS — J45909 Unspecified asthma, uncomplicated: Secondary | ICD-10-CM | POA: Diagnosis not present

## 2023-08-02 DIAGNOSIS — I10 Essential (primary) hypertension: Secondary | ICD-10-CM | POA: Diagnosis not present

## 2023-08-02 MED ORDER — SODIUM CHLORIDE 0.9 % IV SOLN: 500.0000 mL | Freq: Once | INTRAVENOUS | Status: DC

## 2023-08-02 NOTE — Op Note (Signed)
[facility] Patient Name: [name] [name] Procedure Date: 08/02/2023 11:08 AM MRN: [id number] Endoscopist: Wilhemina Bonito. Marina Goodell , MD, [id number] Age: 77 Referring MD:  Date of Birth: 09-24-1946 Gender: Female Account #: [id number] Procedure:                Colonoscopy Indications:              High risk colon cancer surveillance: Personal                            history of non-advanced adenoma, High risk colon                            cancer surveillance: Personal history of colon                            cancer. Status post right hemicolectomy 1987.                            Multiple subsequent surveillance examinations (most                            recently 2009, 2014, 2019). Medicines:                Monitored Anesthesia Care Procedure:                Pre-Anesthesia Assessment:                           - Prior to the procedure, a History and Physical                            was performed, and patient medications and                            allergies were reviewed. The patient's tolerance of                            previous anesthesia was also reviewed. The risks                            and benefits of the procedure and the sedation                            options and risks were discussed with the patient.                            All questions were answered, and informed consent                            was obtained. Prior Anticoagulants: The patient has                            taken no anticoagulant or antiplatelet agents. ASA  Grade Assessment: III - A patient with severe                            systemic disease. After reviewing the risks and                            benefits, the patient was deemed in satisfactory                            condition to undergo the procedure.                           After obtaining informed consent, the colonoscope                            was passed under direct vision.  Throughout the                            procedure, the patient's blood pressure, pulse, and                            oxygen saturations were monitored continuously. The                            Olympus Scope SN: [id number] was introduced through                            the anus and advanced to the the ileocolonic                            anastomosis. The rectum was photographed. The                            quality of the bowel preparation was excellent. The                            colonoscopy was performed without difficulty. The                            patient tolerated the procedure well. The bowel                            preparation used was SUPREP via split dose                            instruction. Scope In: 11:18:16 AM Scope Out: 11:24:38 AM Scope Withdrawal Time: 0 hours 4 minutes 25 seconds  Total Procedure Duration: 0 hours 6 minutes 22 seconds  Findings:                 A few diverticula were found in the sigmoid colon.                           Internal hemorrhoids were found during retroflexion.  There was evidence of prior right hemicolectomy.                            Unremarkable anastomotic region. Exam was otherwise                            without abnormality on direct and retroflexion                            views. Complications:            No immediate complications. Estimated blood loss:                            None. Estimated Blood Loss:     Estimated blood loss: none. Impression:               - Diverticulosis in the sigmoid colon. Status post                            right hemicolectomy.                           - Internal hemorrhoids.                           - The examination was otherwise normal on direct                            and retroflexion views.                           - No specimens collected. Recommendation:           - Repeat colonoscopy is not recommended for                             surveillance.                           - Patient has a contact number available for                            emergencies. The signs and symptoms of potential                            delayed complications were discussed with the                            patient. Return to normal activities tomorrow.                            Written discharge instructions were provided to the                            patient.                           -  Resume previous diet.                           - Continue present medications. Wilhemina Bonito. Marina Goodell, MD 08/02/2023 11:31:48 AM This report has been signed electronically.

## 2023-08-02 NOTE — Patient Instructions (Signed)
Handouts Provided:  Diverticulosis  Repeat Colonoscopy is not recommended for surveillance.  YOU HAD AN ENDOSCOPIC PROCEDURE TODAY AT THE [facility]:   Refer to the procedure report that was given to you for any specific questions about what was found during the examination.  If the procedure report does not answer your questions, please call your gastroenterologist to clarify.  If you requested that your care partner not be given the details of your procedure findings, then the procedure report has been included in a sealed envelope for you to review at your convenience later.  YOU SHOULD EXPECT: Some feelings of bloating in the abdomen. Passage of more gas than usual.  Walking can help get rid of the air that was put into your GI tract during the procedure and reduce the bloating. If you had a lower endoscopy (such as a colonoscopy or flexible sigmoidoscopy) you may notice spotting of blood in your stool or on the toilet paper. If you underwent a bowel prep for your procedure, you may not have a normal bowel movement for a few days.  Please Note:  You might notice some irritation and congestion in your nose or some drainage.  This is from the oxygen used during your procedure.  There is no need for concern and it should clear up in a day or so.  SYMPTOMS TO REPORT IMMEDIATELY:  Following lower endoscopy (colonoscopy or flexible sigmoidoscopy):  Excessive amounts of blood in the stool  Significant tenderness or worsening of abdominal pains  Swelling of the abdomen that is new, acute  Fever of 100F or higher  For urgent or emergent issues, a gastroenterologist can be reached at any hour by calling [phone]. Do not use MyChart messaging for urgent concerns.    DIET:  We do recommend a small meal at first, but then you may proceed to your regular diet.  Drink plenty of fluids but you should avoid alcoholic beverages for 24 hours.  ACTIVITY:  You should plan to take it easy  for the rest of today and you should NOT DRIVE or use heavy machinery until tomorrow (because of the sedation medicines used during the test).    FOLLOW UP: Our staff will call the number listed on your records the next business day following your procedure.  We will call around 7:15- 8:00 am to check on you and address any questions or concerns that you may have regarding the information given to you following your procedure. If we do not reach you, we will leave a message.     If any biopsies were taken you will be contacted by phone or by letter within the next 1-3 weeks.  Please call us at [phone] if you have not heard about the biopsies in 3 weeks.    SIGNATURES/CONFIDENTIALITY: You and/or your care partner have signed paperwork which will be entered into your electronic medical record.  These signatures attest to the fact that that the information above on your After Visit Summary has been reviewed and is understood.  Full responsibility of the confidentiality of this discharge information lies with you and/or your care-partner.

## 2023-08-02 NOTE — Progress Notes (Signed)
Report to PACU, RN, vss, BBS= Clear.  

## 2023-08-02 NOTE — Progress Notes (Signed)
HISTORY OF PRESENT ILLNESS:  [name] [name] is a 77 y.o. female with history of colon cancer status post right hemicolectomy 1987.  Multiple follow-up surveillance exams.  History of adenomas.  Now for surveillance  REVIEW OF SYSTEMS:  All non-GI ROS negative except for  Past Medical History:  Diagnosis Date   ARTHRITIS 01/30/2008   ASTHMA 09/03/2007   Cancer (HCC) 12/12/1985   palate cancer   COLON CANCER 04/07/2008   pt states she had colon cancer 1987   COPD 09/03/2007   COPD (chronic obstructive pulmonary disease) (HCC) 04/22/2013   CORONARY ARTERY DISEASE 09/03/2007   DEGENERATIVE JOINT DISEASE 01/30/2008   Eczema    GERD 09/03/2007   GLUCOSE INTOLERANCE 09/03/2007   GOITER, MULTINODULAR 05/21/2010   History of blood transfusion    HYPERCHOLESTEROLEMIA 09/03/2007   HYPERTENSION 09/03/2007   Impaired glucose tolerance 04/10/2011   LVH (left ventricular hypertrophy)    OBESITY 09/03/2007   Osteopenia    Psoriasis    Shortness of breath dyspnea    With exertion and allergies to pollen    Past Surgical History:  Procedure Laterality Date   ABDOMINAL HYSTERECTOMY     CARDIAC CATHETERIZATION     COLON SURGERY     EYE SURGERY Right 2023   Cancer   FLOOR OF MOUTH BIOPSY N/A 12/24/2015   Procedure: ORAL BIOPSY;  Surgeon: Serena Colonel, MD;  Location: [facility] OR;  Service: ENT;  Laterality: N/A;   HEMICOLECTOMY  12/12/1985   cancer   HERNIA REPAIR     MAXILLECTOMY Right 01/22/2016   Procedure: RIGHT SIDE PARTIAL MAXILLECTOMY;  Surgeon: Serena Colonel, MD;  Location: [facility] OR;  Service: ENT;  Laterality: Right;   NECK SURGERY  2022   Removed bone   TUBAL LIGATION      Social History [name] [name]  reports that she quit smoking about 44 years ago. Her smoking use included cigarettes. She started smoking about 59 years ago. She has a 15 pack-year smoking history. She has never used smokeless tobacco. She reports that she does not drink alcohol and does not use drugs.  family  history includes Cancer in her sister and another family member; Colon cancer in her paternal aunt and another family member; Heart disease in her mother; Liver disease in her father; Thyroid disease in her brother and sister.  Allergies  Allergen Reactions   Simvastatin Other (See Comments)    Muscle spasms       PHYSICAL EXAMINATION: Vital signs: BP 128/80   Pulse (!) 54   Temp (!) 96.8 F (36 C)   Ht 5\' 1"  (1.549 m)   Wt 114 lb (51.7 kg)   SpO2 97%   BMI 21.54 kg/m  General: Well-developed, well-nourished, no acute distress HEENT: Sclerae are anicteric, conjunctiva pink. Oral mucosa intact Lungs: Clear Heart: Regular Abdomen: soft, nontender, nondistended, no obvious ascites, no peritoneal signs, normal bowel sounds. No organomegaly. Extremities: No edema Psychiatric: alert and oriented x3. Cooperative     ASSESSMENT:  Personal history of colon cancer and adenomatous polyps   PLAN:   Surveillance colonoscopy

## 2023-08-02 NOTE — Progress Notes (Signed)
VS by CW  Pt's states no medical or surgical changes since previsit or office visit.  

## 2023-08-03 ENCOUNTER — Telehealth: Payer: Self-pay

## 2023-08-03 NOTE — Telephone Encounter (Signed)
  Follow up Call-     08/02/2023   10:44 AM  Call back number  Post procedure Call Back phone  # [phone]  Permission to leave phone message Yes     Patient questions:  Do you have a fever, pain , or abdominal swelling? No. Pain Score  0 *  Have you tolerated food without any problems? Yes.    Have you been able to return to your normal activities? Yes.    Do you have any questions about your discharge instructions: Diet   No. Medications  No. Follow up visit  No.  Do you have questions or concerns about your Care? No.  Actions: * If pain score is 4 or above: No action needed, pain <4.

## 2023-08-26 ENCOUNTER — Other Ambulatory Visit: Payer: Self-pay | Admitting: Internal Medicine

## 2023-08-28 ENCOUNTER — Other Ambulatory Visit: Payer: Self-pay

## 2023-09-06 DIAGNOSIS — C069 Malignant neoplasm of mouth, unspecified: Secondary | ICD-10-CM | POA: Diagnosis not present

## 2023-09-06 DIAGNOSIS — C310 Malignant neoplasm of maxillary sinus: Secondary | ICD-10-CM | POA: Diagnosis not present

## 2023-09-18 ENCOUNTER — Ambulatory Visit (INDEPENDENT_AMBULATORY_CARE_PROVIDER_SITE_OTHER): Payer: Medicare Other | Admitting: Internal Medicine

## 2023-09-18 ENCOUNTER — Encounter: Payer: Self-pay | Admitting: Internal Medicine

## 2023-09-18 VITALS — BP 114/70 | HR 54 | Ht 61.0 in | Wt 109.0 lb

## 2023-09-18 DIAGNOSIS — E0590 Thyrotoxicosis, unspecified without thyrotoxic crisis or storm: Secondary | ICD-10-CM | POA: Diagnosis not present

## 2023-09-18 LAB — T4, FREE: Free T4: 0.8 ng/dL (ref 0.60–1.60)

## 2023-09-18 LAB — TSH: TSH: 3.54 u[IU]/mL (ref 0.35–5.50)

## 2023-09-18 NOTE — Progress Notes (Unsigned)
Name: [name] [name]  MRN/ DOB: [id number], 1946-03-25    Age/ Sex: 77 y.o., female     PCP: Corwin Levins, MD   Reason for Endocrinology Evaluation: Hyperthyroidism/MNG     Initial Endocrinology Clinic Visit: 04/03/2013    PATIENT IDENTIFIER: [name] [name] is a 77 y.o., female with a past medical history of HTN, CAD, hyperthyroidism, asthma and DM. She has followed with [facility] since 04/03/2013 for consultative assistance with management of her Hyperthyroidism/MNG.   HISTORICAL SUMMARY: The patient was first diagnosed with multinodular goiter and hyperthyroid in 2011.  She was not started on methimazole until 2016.  She had declined radioactive iodine ablation in the past by her previous endocrinologist note   Of note she has had recurrent adenocarcinoma of the maxillary sinus that she was initially diagnosed with in 2016.  None of the thyroid nodules has met criteria in the past for FNA, with the last thyroid ultrasound in 2014 showed stability of the nodules   She was followed by Dr. Everardo All from 2014 until March 2023  SUBJECTIVE:    Today (09/18/2023):  Ms. [name] is here for hyperthyroidism .  She is S/P right orbitotomy with resection of intra-orbital tumor 11/2022 due to adenocarcinoma of maxillary sinus with recurrence .  She continues to follow-up with ENT  She continues to follow up with sx and radio oncologist    Weight fluctuates  Denies local neck swelling but has noted slight difficulty with swallowing with a cough  Has noted headaches  Denies palpitations Has mild occasional constipation  Denies tremors     Methimazole 5 mg , 1 tablet Monday through Thursday  HISTORY:  Past Medical History:  Past Medical History:  Diagnosis Date   ARTHRITIS 01/30/2008   ASTHMA 09/03/2007   Cancer (HCC) 12/12/1985   palate cancer   COLON CANCER 04/07/2008   pt states she had colon cancer 1987   COPD 09/03/2007   COPD (chronic obstructive  pulmonary disease) (HCC) 04/22/2013   CORONARY ARTERY DISEASE 09/03/2007   DEGENERATIVE JOINT DISEASE 01/30/2008   Eczema    GERD 09/03/2007   GLUCOSE INTOLERANCE 09/03/2007   GOITER, MULTINODULAR 05/21/2010   History of blood transfusion    HYPERCHOLESTEROLEMIA 09/03/2007   HYPERTENSION 09/03/2007   Impaired glucose tolerance 04/10/2011   LVH (left ventricular hypertrophy)    OBESITY 09/03/2007   Osteopenia    Psoriasis    Shortness of breath dyspnea    With exertion and allergies to pollen   Past Surgical History:  Past Surgical History:  Procedure Laterality Date   ABDOMINAL HYSTERECTOMY     CARDIAC CATHETERIZATION     COLON SURGERY     EYE SURGERY Right 2023   Cancer   FLOOR OF MOUTH BIOPSY N/A 12/24/2015   Procedure: ORAL BIOPSY;  Surgeon: Serena Colonel, MD;  Location: [facility] OR;  Service: ENT;  Laterality: N/A;   HEMICOLECTOMY  12/12/1985   cancer   HERNIA REPAIR     MAXILLECTOMY Right 01/22/2016   Procedure: RIGHT SIDE PARTIAL MAXILLECTOMY;  Surgeon: Serena Colonel, MD;  Location: [facility] OR;  Service: ENT;  Laterality: Right;   NECK SURGERY  2022   Removed bone   TUBAL LIGATION     Social History:  reports that she quit smoking about 44 years ago. Her smoking use included cigarettes. She started smoking about 59 years ago. She has a 15 pack-year smoking history. She has never used smokeless tobacco. She reports that she does not  drink alcohol and does not use drugs. Family History:  Family History  Problem Relation Age of Onset   Liver disease Father    Heart disease Mother    Colon cancer Paternal Aunt    Thyroid disease Sister        overactive   Cancer Sister        lung, adrenal gland   Thyroid disease Brother        overactive   Colon cancer Other        aunt   Cancer Other        Colon Cancer-Aunt   Breast cancer Neg Hx    Esophageal cancer Neg Hx    Rectal cancer Neg Hx    Stomach cancer Neg Hx      HOME MEDICATIONS: Allergies as of 09/18/2023        Reactions   Simvastatin Other (See Comments)   Muscle spasms        Medication List        Accurate as of September 18, 2023 10:40 AM. If you have any questions, ask your nurse or doctor.          amLODipine 5 MG tablet Commonly known as: NORVASC Take 1 tablet (5 mg total) by mouth daily.   aspirin EC 81 MG tablet Take 1 tablet (81 mg total) by mouth daily.   cholecalciferol 25 MCG (1000 UNIT) tablet Commonly known as: VITAMIN D3 Take 1,000 Units by mouth daily. Twice a week   clobetasol ointment 0.05 % Commonly known as: TEMOVATE Use twice once daily to hand and feet as needed What changed:  how much to take how to take this when to take this   cyanocobalamin 1000 MCG tablet Commonly known as: VITAMIN B12 Take 1 tablet (1,000 mcg total) by mouth every other day. What changed: additional instructions   Fluad Quadrivalent 0.5 ML injection Generic drug: influenza vaccine adjuvanted   hydrochlorothiazide 25 MG tablet Commonly known as: HYDRODIURIL TAKE 1 TABLET (25 MG TOTAL) BY MOUTH DAILY.   iron polysaccharides 150 MG capsule Commonly known as: NIFEREX TAKE 1 CAPSULE BY MOUTH EVERY DAY   latanoprost 0.005 % ophthalmic solution Commonly known as: XALATAN 1 drop at bedtime.   lovastatin 40 MG tablet Commonly known as: MEVACOR TAKE 2 TABLETS BY MOUTH DAILY   methimazole 5 MG tablet Commonly known as: TAPAZOLE Take 1 tablet (5 mg total) by mouth as directed. 1 tablet Monday through Thursday (skip the other 3 days of the week)   metoprolol tartrate 25 MG tablet Commonly known as: LOPRESSOR TAKE 1 1/2 TABLETS TWICE A DAY   omeprazole 20 MG capsule Commonly known as: PRILOSEC TAKE 1 CAPSULE BY MOUTH EVERY DAY   ONE TOUCH ULTRA SYSTEM KIT w/Device Kit 1 kit by Does not apply route once. 250.02   OneTouch Delica Plus Lancet30G Misc USE TO CHECK BLOOD SUGAR ONCE DAILY   OneTouch Ultra test strip Generic drug: glucose blood USE TO CHECK BLOOD SUGARS  DAILY. ANNUAL APPT DUE IN JULY MUST SEE PROVIDER FOR FUTURE REFILLS   Spikevax syringe Generic drug: COVID-19 mRNA vaccine   tiZANidine 4 MG tablet Commonly known as: ZANAFLEX TAKE 1 TABLET BY MOUTH EVERY 6 HOURS AS NEEDED FOR MUSCLE SPASMS.   traMADol 50 MG tablet Commonly known as: ULTRAM TAKE 1 TABLET BY MOUTH EVERY 6 HOURS AS NEEDED. FOR PAIN          OBJECTIVE:   PHYSICAL EXAM: VS: BP 114/70 (BP Location: Left  Arm, Patient Position: Sitting, Cuff Size: Small)   Pulse (!) 54   Ht 5\' 1"  (1.549 m)   Wt 109 lb (49.4 kg)   SpO2 96%   BMI 20.60 kg/m    EXAM: General: Pt appears well and is in NAD  Neck: General: Supple without adenopathy. Thyroid: Thyroid size normal.  No goiter or nodules appreciated.   Lungs: Clear with good BS bilat   Heart: Auscultation: RRR.  Abdomen: soft, nontender  Extremities:  BL LE: No pretibial edema   Mental Status: Judgment, insight: Intact Orientation: Oriented to time, place, and person Mood and affect: No depression, anxiety, or agitation     DATA REVIEWED:   Latest Reference Range & Units 09/18/23 11:00  TSH 0.35 - 5.50 uIU/mL 3.54  T4,Free(Direct) 0.60 - 1.60 ng/dL 2.13    Latest Reference Range & Units 06/29/22 08:41  Sodium 135 - 145 mEq/L 138  Potassium 3.5 - 5.1 mEq/L 3.8  Chloride 96 - 112 mEq/L 98  CO2 19 - 32 mEq/L 32  Glucose 70 - 99 mg/dL 80  BUN 6 - 23 mg/dL 13  Creatinine 0.86 - 5.78 mg/dL 4.69  Calcium 8.4 - 62.9 mg/dL 9.4  Alkaline Phosphatase 39 - 117 U/L 62  Albumin 3.5 - 5.2 g/dL 4.3  AST 0 - 37 U/L 20  ALT 0 - 35 U/L 14  Total Protein 6.0 - 8.3 g/dL 7.0  Bilirubin, Direct 0.0 - 0.3 mg/dL 0.2  Total Bilirubin 0.2 - 1.2 mg/dL 0.9  GFR >52.84 mL/min 73.57      ASSESSMENT / PLAN / RECOMMENDATIONS:   Hyperthyroidism  -Patient is clinically euthyroid -TSH is at the upper limit of normal will decrease methimazole as below  Medications   Decrease methimazole 5 mg, 1 tablet Monday through  Wednesday (skip Thursday, Friday, Saturday and Sundays)     2.MNG:  -No local neck symptoms -We will continue to monitor clinically -Last ultrasound in 2014 showed stability over the prior 3 years   Follow-up in 6 months  Signed electronically by: Lyndle Herrlich, MD  [facility] Endocrinology  [facility] [address] [place] [place], [place] [address] Phone: [phone] FAX: [phone]      CC: Corwin Levins, MD [address] [place] [address] Phone: [phone]  Fax: [phone]   Return to Endocrinology clinic as below: Future Appointments  Date Time Provider Department Center  01/05/2024 10:20 AM Corwin Levins, MD [facility] None

## 2023-09-19 MED ORDER — METHIMAZOLE 5 MG PO TABS: 5.0000 mg | ORAL_TABLET | ORAL | 3 refills | Status: DC

## 2023-10-04 DIAGNOSIS — C069 Malignant neoplasm of mouth, unspecified: Secondary | ICD-10-CM | POA: Diagnosis not present

## 2023-10-18 DIAGNOSIS — R1310 Dysphagia, unspecified: Secondary | ICD-10-CM | POA: Diagnosis not present

## 2023-10-18 DIAGNOSIS — C069 Malignant neoplasm of mouth, unspecified: Secondary | ICD-10-CM | POA: Diagnosis not present

## 2023-10-18 DIAGNOSIS — C051 Malignant neoplasm of soft palate: Secondary | ICD-10-CM | POA: Diagnosis not present

## 2023-10-24 DIAGNOSIS — R1312 Dysphagia, oropharyngeal phase: Secondary | ICD-10-CM | POA: Diagnosis not present

## 2023-10-26 ENCOUNTER — Other Ambulatory Visit: Payer: Self-pay | Admitting: Internal Medicine

## 2023-10-26 ENCOUNTER — Other Ambulatory Visit: Payer: Self-pay

## 2023-10-30 DIAGNOSIS — C059 Malignant neoplasm of palate, unspecified: Secondary | ICD-10-CM | POA: Insufficient documentation

## 2023-11-20 DIAGNOSIS — J45909 Unspecified asthma, uncomplicated: Secondary | ICD-10-CM | POA: Diagnosis not present

## 2023-11-20 DIAGNOSIS — I152 Hypertension secondary to endocrine disorders: Secondary | ICD-10-CM | POA: Diagnosis not present

## 2023-11-20 DIAGNOSIS — K219 Gastro-esophageal reflux disease without esophagitis: Secondary | ICD-10-CM | POA: Diagnosis not present

## 2023-11-20 DIAGNOSIS — C760 Malignant neoplasm of head, face and neck: Secondary | ICD-10-CM | POA: Diagnosis not present

## 2023-11-20 DIAGNOSIS — E0590 Thyrotoxicosis, unspecified without thyrotoxic crisis or storm: Secondary | ICD-10-CM | POA: Diagnosis not present

## 2023-11-20 DIAGNOSIS — E119 Type 2 diabetes mellitus without complications: Secondary | ICD-10-CM | POA: Diagnosis not present

## 2023-11-20 DIAGNOSIS — R768 Other specified abnormal immunological findings in serum: Secondary | ICD-10-CM | POA: Diagnosis not present

## 2023-11-20 DIAGNOSIS — Z01818 Encounter for other preprocedural examination: Secondary | ICD-10-CM | POA: Diagnosis not present

## 2023-11-28 DIAGNOSIS — K219 Gastro-esophageal reflux disease without esophagitis: Secondary | ICD-10-CM | POA: Diagnosis not present

## 2023-11-28 DIAGNOSIS — I2510 Atherosclerotic heart disease of native coronary artery without angina pectoris: Secondary | ICD-10-CM | POA: Diagnosis not present

## 2023-11-28 DIAGNOSIS — D490 Neoplasm of unspecified behavior of digestive system: Secondary | ICD-10-CM | POA: Diagnosis not present

## 2023-11-28 DIAGNOSIS — C059 Malignant neoplasm of palate, unspecified: Secondary | ICD-10-CM | POA: Diagnosis not present

## 2023-11-28 DIAGNOSIS — E119 Type 2 diabetes mellitus without complications: Secondary | ICD-10-CM | POA: Diagnosis not present

## 2023-11-28 DIAGNOSIS — Z87891 Personal history of nicotine dependence: Secondary | ICD-10-CM | POA: Diagnosis not present

## 2023-11-28 DIAGNOSIS — I10 Essential (primary) hypertension: Secondary | ICD-10-CM | POA: Diagnosis not present

## 2023-11-28 DIAGNOSIS — J45909 Unspecified asthma, uncomplicated: Secondary | ICD-10-CM | POA: Diagnosis not present

## 2023-11-28 DIAGNOSIS — Z859 Personal history of malignant neoplasm, unspecified: Secondary | ICD-10-CM | POA: Diagnosis not present

## 2023-11-28 DIAGNOSIS — Z888 Allergy status to other drugs, medicaments and biological substances status: Secondary | ICD-10-CM | POA: Diagnosis not present

## 2023-11-28 DIAGNOSIS — Z7982 Long term (current) use of aspirin: Secondary | ICD-10-CM | POA: Diagnosis not present

## 2023-11-28 DIAGNOSIS — E0590 Thyrotoxicosis, unspecified without thyrotoxic crisis or storm: Secondary | ICD-10-CM | POA: Diagnosis not present

## 2023-11-28 DIAGNOSIS — C051 Malignant neoplasm of soft palate: Secondary | ICD-10-CM | POA: Diagnosis not present

## 2023-11-28 DIAGNOSIS — R252 Cramp and spasm: Secondary | ICD-10-CM | POA: Diagnosis not present

## 2023-11-28 DIAGNOSIS — C7989 Secondary malignant neoplasm of other specified sites: Secondary | ICD-10-CM | POA: Diagnosis not present

## 2023-12-03 ENCOUNTER — Other Ambulatory Visit: Payer: Self-pay | Admitting: Internal Medicine

## 2023-12-04 ENCOUNTER — Other Ambulatory Visit: Payer: Self-pay

## 2023-12-20 DIAGNOSIS — C051 Malignant neoplasm of soft palate: Secondary | ICD-10-CM | POA: Diagnosis not present

## 2023-12-29 ENCOUNTER — Other Ambulatory Visit (INDEPENDENT_AMBULATORY_CARE_PROVIDER_SITE_OTHER): Payer: Medicare Other

## 2023-12-29 DIAGNOSIS — E538 Deficiency of other specified B group vitamins: Secondary | ICD-10-CM | POA: Diagnosis not present

## 2023-12-29 DIAGNOSIS — E559 Vitamin D deficiency, unspecified: Secondary | ICD-10-CM | POA: Diagnosis not present

## 2023-12-29 DIAGNOSIS — E1140 Type 2 diabetes mellitus with diabetic neuropathy, unspecified: Secondary | ICD-10-CM

## 2023-12-29 LAB — BASIC METABOLIC PANEL
BUN: 12 mg/dL (ref 6–23)
CO2: 32 meq/L (ref 19–32)
Calcium: 9.3 mg/dL (ref 8.4–10.5)
Chloride: 101 meq/L (ref 96–112)
Creatinine, Ser: 0.79 mg/dL (ref 0.40–1.20)
GFR: 71.7 mL/min (ref >60.00)
Glucose, Bld: 78 mg/dL (ref 70–99)
Potassium: 3.6 meq/L (ref 3.5–5.1)
Sodium: 142 meq/L (ref 135–145)

## 2023-12-29 LAB — CBC WITH DIFFERENTIAL/PLATELET
Basophils Absolute: 0 10*3/uL (ref 0.0–0.1)
Basophils Relative: 0.7 % (ref 0.0–3.0)
Eosinophils Absolute: 0.2 10*3/uL (ref 0.0–0.7)
Eosinophils Relative: 5.1 % — ABNORMAL HIGH (ref 0.0–5.0)
HCT: 38.2 % (ref 36.0–46.0)
Hemoglobin: 12.6 g/dL (ref 12.0–15.0)
Lymphocytes Relative: 18.6 % (ref 12.0–46.0)
Lymphs Abs: 0.9 10*3/uL (ref 0.7–4.0)
MCHC: 33 g/dL (ref 30.0–36.0)
MCV: 81.2 fL (ref 78.0–100.0)
Monocytes Absolute: 0.4 10*3/uL (ref 0.1–1.0)
Monocytes Relative: 8.2 % (ref 3.0–12.0)
Neutro Abs: 3.2 10*3/uL (ref 1.4–7.7)
Neutrophils Relative %: 67.4 % (ref 43.0–77.0)
Platelets: 275 10*3/uL (ref 150.0–400.0)
RBC: 4.71 Mil/uL (ref 3.87–5.11)
RDW: 16 % — ABNORMAL HIGH (ref 11.5–15.5)
WBC: 4.7 10*3/uL (ref 4.0–10.5)

## 2023-12-29 LAB — MICROALBUMIN / CREATININE URINE RATIO
Creatinine,U: 156.2 mg/dL
Creatinine,U: 156.4 mg/dL
Microalb Creat Ratio: 1.4 mg/g (ref 0.0–30.0)
Microalb Creat Ratio: 1.3 mg/g (ref 0.0–30.0)
Microalb, Ur: 2.2 mg/dL — ABNORMAL HIGH (ref 0.0–1.9)
Microalb, Ur: 2.1 mg/dL — ABNORMAL HIGH (ref 0.0–1.9)

## 2023-12-29 LAB — URINALYSIS, ROUTINE W REFLEX MICROSCOPIC
Bilirubin Urine: NEGATIVE
Hgb urine dipstick: NEGATIVE
Ketones, ur: NEGATIVE
Leukocytes,Ua: NEGATIVE
Nitrite: NEGATIVE
Specific Gravity, Urine: 1.015 (ref 1.000–1.030)
Total Protein, Urine: NEGATIVE
Urine Glucose: NEGATIVE
Urobilinogen, UA: 0.2 (ref 0.0–1.0)
pH: 7.5 (ref 5.0–8.0)

## 2023-12-29 LAB — LIPID PANEL
Cholesterol: 158 mg/dL (ref 0–200)
HDL: 57.4 mg/dL (ref >39.00)
LDL Cholesterol: 86 mg/dL (ref 0–99)
NonHDL: 100.73
Total CHOL/HDL Ratio: 3
Triglycerides: 74 mg/dL (ref 0.0–149.0)
VLDL: 14.8 mg/dL (ref 0.0–40.0)

## 2023-12-29 LAB — HEPATIC FUNCTION PANEL
ALT: 9 U/L (ref 0–35)
AST: 16 U/L (ref 0–37)
Albumin: 3.8 g/dL (ref 3.5–5.2)
Alkaline Phosphatase: 60 U/L (ref 39–117)
Bilirubin, Direct: 0.2 mg/dL (ref 0.0–0.3)
Total Bilirubin: 0.7 mg/dL (ref 0.2–1.2)
Total Protein: 6.5 g/dL (ref 6.0–8.3)

## 2023-12-29 LAB — TSH: TSH: 2.56 u[IU]/mL (ref 0.35–5.50)

## 2023-12-29 LAB — VITAMIN B12: Vitamin B-12: 628 pg/mL (ref 211–911)

## 2023-12-29 LAB — VITAMIN D 25 HYDROXY (VIT D DEFICIENCY, FRACTURES): VITD: 52.33 ng/mL (ref 30.00–100.00)

## 2024-01-05 ENCOUNTER — Ambulatory Visit (INDEPENDENT_AMBULATORY_CARE_PROVIDER_SITE_OTHER): Payer: Medicare Other | Admitting: Internal Medicine

## 2024-01-05 ENCOUNTER — Encounter: Payer: Self-pay | Admitting: Internal Medicine

## 2024-01-05 VITALS — BP 122/76 | HR 60 | Temp 97.9°F | Ht 61.0 in | Wt 103.0 lb

## 2024-01-05 DIAGNOSIS — E7800 Pure hypercholesterolemia, unspecified: Secondary | ICD-10-CM | POA: Diagnosis not present

## 2024-01-05 DIAGNOSIS — C310 Malignant neoplasm of maxillary sinus: Secondary | ICD-10-CM

## 2024-01-05 DIAGNOSIS — E1140 Type 2 diabetes mellitus with diabetic neuropathy, unspecified: Secondary | ICD-10-CM | POA: Diagnosis not present

## 2024-01-05 DIAGNOSIS — E559 Vitamin D deficiency, unspecified: Secondary | ICD-10-CM | POA: Diagnosis not present

## 2024-01-05 DIAGNOSIS — I10 Essential (primary) hypertension: Secondary | ICD-10-CM | POA: Diagnosis not present

## 2024-01-05 DIAGNOSIS — Z0001 Encounter for general adult medical examination with abnormal findings: Secondary | ICD-10-CM

## 2024-01-05 DIAGNOSIS — E538 Deficiency of other specified B group vitamins: Secondary | ICD-10-CM | POA: Diagnosis not present

## 2024-01-05 MED ORDER — METOPROLOL TARTRATE 25 MG PO TABS: ORAL_TABLET | ORAL | 3 refills | Status: DC

## 2024-01-05 MED ORDER — LOVASTATIN 40 MG PO TABS: ORAL_TABLET | ORAL | 3 refills | Status: AC

## 2024-01-05 MED ORDER — OMEPRAZOLE 20 MG PO CPDR: DELAYED_RELEASE_CAPSULE | ORAL | 3 refills | Status: AC

## 2024-01-05 MED ORDER — TRAMADOL HCL 50 MG PO TABS: 50.0000 mg | ORAL_TABLET | Freq: Four times a day (QID) | ORAL | 5 refills | Status: DC | PRN

## 2024-01-05 MED ORDER — AMLODIPINE BESYLATE 5 MG PO TABS: 5.0000 mg | ORAL_TABLET | Freq: Every day | ORAL | 3 refills | Status: DC

## 2024-01-05 NOTE — Progress Notes (Unsigned)
Patient ID: [name] [name], female   DOB: 14-Jan-1946, 78 y.o.   MRN: [id number]         Chief Complaint:: wellness exam and dm, htn, hld, low vit d       HPI:  [name] [name] is a 78 y.o. female here for wellness exam; declines covid booster, for shingrix at pharmacy, o/w up to date                        Also Pt denies chest pain, increased sob or doe, wheezing, orthopnea, PND, increased LE swelling, palpitations, dizziness or syncope.  Pt denies polydipsia, polyuria, or new focal neuro s/s.    Pt denies fever, wt loss, night sweats, loss of appetite, or other constitutional symptoms     Wt Readings from Last 3 Encounters:  01/05/24 103 lb (46.7 kg)  09/18/23 109 lb (49.4 kg)  08/02/23 114 lb (51.7 kg)   BP Readings from Last 3 Encounters:  01/05/24 122/76  09/18/23 114/70  08/02/23 117/63   Immunization History  Administered Date(s) Administered   Fluad Quad(high Dose 65+) 07/15/2019, 09/02/2022   H1N1 10/13/2008   Influenza Split 07/31/2012   Influenza Whole 09/12/2007, 09/12/2008, 08/20/2009, 08/26/2011   Influenza, High Dose Seasonal PF 08/26/2014, 08/30/2016, 08/24/2017, 08/24/2018, 08/05/2019, 08/05/2019, 08/21/2020, 08/20/2021   Influenza, Seasonal, Injecte, Preservative Fre 08/26/2013   Influenza-Unspecified 08/27/2015, 10/12/2020   Moderna Sars-Covid-2 Vaccination 02/07/2020, 03/11/2020, 11/17/2020, 09/21/2021, 10/13/2022   Pneumococcal Conjugate-13 10/31/2014   Pneumococcal Polysaccharide-23 12/21/2010   Td 03/22/1995, 09/20/2007   Tdap 11/22/2017, 11/22/2017   Health Maintenance Due  Topic Date Due   Zoster Vaccines- Shingrix (1 of 2) Never done   Medicare Annual Wellness (AWV)  11/10/2017   COVID-19 Vaccine (6 - 2024-25 season) 08/13/2023   HEMOGLOBIN A1C  12/27/2023      Past Medical History:  Diagnosis Date   ARTHRITIS 01/30/2008   ASTHMA 09/03/2007   Cancer (HCC) 12/12/1985   palate cancer   COLON CANCER 04/07/2008   pt states she had colon cancer 1987    COPD 09/03/2007   COPD (chronic obstructive pulmonary disease) (HCC) 04/22/2013   CORONARY ARTERY DISEASE 09/03/2007   DEGENERATIVE JOINT DISEASE 01/30/2008   Eczema    GERD 09/03/2007   GLUCOSE INTOLERANCE 09/03/2007   GOITER, MULTINODULAR 05/21/2010   History of blood transfusion    HYPERCHOLESTEROLEMIA 09/03/2007   HYPERTENSION 09/03/2007   Impaired glucose tolerance 04/10/2011   LVH (left ventricular hypertrophy)    OBESITY 09/03/2007   Osteopenia    Psoriasis    Shortness of breath dyspnea    With exertion and allergies to pollen   Past Surgical History:  Procedure Laterality Date   ABDOMINAL HYSTERECTOMY     CARDIAC CATHETERIZATION     COLON SURGERY     EYE SURGERY Right 2023   Cancer   FLOOR OF MOUTH BIOPSY N/A 12/24/2015   Procedure: ORAL BIOPSY;  Surgeon: Serena Colonel, MD;  Location: [facility] OR;  Service: ENT;  Laterality: N/A;   HEMICOLECTOMY  12/12/1985   cancer   HERNIA REPAIR     MAXILLECTOMY Right 01/22/2016   Procedure: RIGHT SIDE PARTIAL MAXILLECTOMY;  Surgeon: Serena Colonel, MD;  Location: [facility] OR;  Service: ENT;  Laterality: Right;   NECK SURGERY  2022   Removed bone   TUBAL LIGATION      reports that she quit smoking about 45 years ago. Her smoking use included cigarettes. She started smoking about 60 years ago. She  has a 15 pack-year smoking history. She has never used smokeless tobacco. She reports that she does not drink alcohol and does not use drugs. family history includes Cancer in her sister and another family member; Colon cancer in her paternal aunt and another family member; Heart disease in her mother; Liver disease in her father; Thyroid disease in her brother and sister. Allergies  Allergen Reactions   Simvastatin Other (See Comments)    Muscle spasms   Current Outpatient Medications on File Prior to Visit  Medication Sig Dispense Refill   aspirin EC 81 MG tablet Take 1 tablet (81 mg total) by mouth daily. 90 tablet 11   Blood Glucose  Monitoring Suppl (ONE TOUCH ULTRA SYSTEM KIT) W/DEVICE KIT 1 kit by Does not apply route once. 250.02 1 each 0   cholecalciferol (VITAMIN D3) 25 MCG (1000 UNIT) tablet Take 1,000 Units by mouth daily. Twice a week     clobetasol (TEMOVATE) 0.05 % ointment Use twice once daily to hand and feet as needed (Patient taking differently: Apply 1 application  topically 2 (two) times daily. Use twice once daily to hand and feet as needed) 30 g 1   hydrochlorothiazide (HYDRODIURIL) 25 MG tablet TAKE 1 TABLET (25 MG TOTAL) BY MOUTH DAILY. 90 tablet 3   Lancets (ONETOUCH DELICA PLUS LANCET30G) MISC USE TO CHECK BLOOD SUGAR ONCE DAILY 100 each 1   latanoprost (XALATAN) 0.005 % ophthalmic solution 1 drop at bedtime.     methimazole (TAPAZOLE) 5 MG tablet Take 1 tablet (5 mg total) by mouth as directed. 1 tablet Monday through Wednesday  (skip the other 4 days of the week) 48 tablet 3   ONETOUCH ULTRA test strip USE TO CHECK BLOOD SUGARS DAILY. ANNUAL APPT DUE IN JULY MUST SEE PROVIDER FOR FUTURE REFILLS 100 strip 0   SPIKEVAX syringe      tiZANidine (ZANAFLEX) 4 MG tablet TAKE 1 TABLET BY MOUTH EVERY 6 HOURS AS NEEDED FOR MUSCLE SPASMS. 360 tablet 1   vitamin B-12 (CYANOCOBALAMIN) 1000 MCG tablet Take 1 tablet (1,000 mcg total) by mouth every other day. (Patient taking differently: Take 1,000 mcg by mouth every other day. Twice  a week) 45 tablet 3   No current facility-administered medications on file prior to visit.        ROS:  All others reviewed and negative.  Objective        PE:  BP 122/76 (BP Location: Right Arm, Patient Position: Sitting, Cuff Size: Normal)   Pulse 60   Temp 97.9 F (36.6 C) (Oral)   Ht 5\' 1"  (1.549 m)   Wt 103 lb (46.7 kg)   SpO2 99%   BMI 19.46 kg/m                 Constitutional: Pt appears in NAD               HENT: Head: NCAT.                Right Ear: External ear normal.                 Left Ear: External ear normal.                Eyes: . Pupils are equal, round,  and reactive to light. Conjunctivae and EOM are normal               Nose: without d/c or deformity  Neck: Neck supple. Gross normal ROM               Cardiovascular: Normal rate and regular rhythm.                 Pulmonary/Chest: Effort normal and breath sounds without rales or wheezing.                Abd:  Soft, NT, ND, + BS, no organomegaly               Neurological: Pt is alert. At baseline orientation, motor grossly intact               Skin: Skin is warm. No rashes, no other new lesions, LE edema - none               Psychiatric: Pt behavior is normal without agitation   Micro: none  Cardiac tracings I have personally interpreted today:  none  Pertinent Radiological findings (summarize): none   Lab Results  Component Value Date   WBC 4.7 12/29/2023   HGB 12.6 12/29/2023   HCT 38.2 12/29/2023   PLT 275.0 12/29/2023   GLUCOSE 78 12/29/2023   CHOL 158 12/29/2023   TRIG 74.0 12/29/2023   HDL 57.40 12/29/2023   LDLCALC 86 12/29/2023   ALT 9 12/29/2023   AST 16 12/29/2023   NA 142 12/29/2023   K 3.6 12/29/2023   CL 101 12/29/2023   CREATININE 0.79 12/29/2023   BUN 12 12/29/2023   CO2 32 12/29/2023   TSH 2.56 12/29/2023   INR 1.04 02/08/2016   HGBA1C 4.8 06/26/2023   MICROALBUR 2.1 (H) 12/29/2023   Assessment/Plan:  LILYMAE SWIECH is a 78 y.o. Black or African American [2] female with  has a past medical history of ARTHRITIS (01/30/2008), ASTHMA (09/03/2007), Cancer (HCC) (12/12/1985), COLON CANCER (04/07/2008), COPD (09/03/2007), COPD (chronic obstructive pulmonary disease) (HCC) (04/22/2013), CORONARY ARTERY DISEASE (09/03/2007), DEGENERATIVE JOINT DISEASE (01/30/2008), Eczema, GERD (09/03/2007), GLUCOSE INTOLERANCE (09/03/2007), GOITER, MULTINODULAR (05/21/2010), History of blood transfusion, HYPERCHOLESTEROLEMIA (09/03/2007), HYPERTENSION (09/03/2007), Impaired glucose tolerance (04/10/2011), LVH (left ventricular hypertrophy), OBESITY (09/03/2007),  Osteopenia, Psoriasis, and Shortness of breath dyspnea.  Encounter for well adult exam with abnormal findings Age and sex appropriate education and counseling updated with regular exercise and diet Referrals for preventative services - none needed Immunizations addressed - declines covid booster, for shingrix at pharmacy Smoking counseling  - none needed Evidence for depression or other mood disorder - none significant Most recent labs reviewed. I have personally reviewed and have noted: 1) the patient's medical and social history 2) The patient's current medications and supplements 3) The patient's height, weight, and BMI have been recorded in the chart   B12 deficiency Lab Results  Component Value Date   VITAMINB12 628 12/29/2023   Stable, cont oral replacement - b12 1000 mcg qd   Diabetes mellitus with neuropathy Lab Results  Component Value Date   HGBA1C 4.8 06/26/2023   Stable, pt to continue current medical treatment   - diet, wt control   Essential hypertension BP Readings from Last 3 Encounters:  01/05/24 122/76  09/18/23 114/70  08/02/23 117/63   Stable, pt to continue medical treatment hct 25 every day, norvasc 5 every day, lopressor 25 mg - 1.5 bid   HYPERCHOLESTEROLEMIA Lab Results  Component Value Date   LDLCALC 86 12/29/2023   Stable, pt to continue current statin lovatatin 40 qd   Vitamin D deficiency Last vitamin D Lab Results  Component Value  Date   VD25OH 52.33 12/29/2023   Stable, cont oral replacement  Followup: Return in about 6 months (around 07/04/2024).  Oliver Barre, MD 01/09/2024 10:19 PM [facility] Medicine

## 2024-01-05 NOTE — Patient Instructions (Signed)
Please continue all other medications as before, and refills have been done if requested.  Please have the pharmacy call with any other refills you may need.  Please continue your efforts at being more active, low cholesterol diet, and weight control.  You are otherwise up to date with prevention measures today.  Please keep your appointments with your specialists as you may have planned - [facility]  Please make an Appointment to return in 6 months, or sooner if needed, also with Lab Appointment for testing done 3-5 days before at the FIRST FLOOR Lab (so this is for TWO appointments - please see the scheduling desk as you leave)

## 2024-01-09 ENCOUNTER — Encounter: Payer: Self-pay | Admitting: Internal Medicine

## 2024-01-09 DIAGNOSIS — R1312 Dysphagia, oropharyngeal phase: Secondary | ICD-10-CM | POA: Diagnosis not present

## 2024-01-09 NOTE — Assessment & Plan Note (Signed)
Lab Results  Component Value Date   LDLCALC 86 12/29/2023   Stable, pt to continue current statin lovatatin 40 qd

## 2024-01-09 NOTE — Assessment & Plan Note (Signed)
BP Readings from Last 3 Encounters:  01/05/24 122/76  09/18/23 114/70  08/02/23 117/63   Stable, pt to continue medical treatment hct 25 every day, norvasc 5 every day, lopressor 25 mg - 1.5 bid

## 2024-01-09 NOTE — Assessment & Plan Note (Signed)
Lab Results  Component Value Date   HGBA1C 4.8 06/26/2023   Stable, pt to continue current medical treatment  - diet, wt control

## 2024-01-09 NOTE — Assessment & Plan Note (Signed)
Lab Results  Component Value Date   VITAMINB12 628 12/29/2023   Stable, cont oral replacement - b12 1000 mcg qd

## 2024-01-09 NOTE — Assessment & Plan Note (Signed)
Last vitamin D Lab Results  Component Value Date   VD25OH 52.33 12/29/2023   Stable, cont oral replacement

## 2024-01-09 NOTE — Assessment & Plan Note (Signed)
Age and sex appropriate education and counseling updated with regular exercise and diet Referrals for preventative services - none needed Immunizations addressed - declines covid booster, for shingrix at pharmacy Smoking counseling  - none needed Evidence for depression or other mood disorder - none significant Most recent labs reviewed. I have personally reviewed and have noted: 1) the patient's medical and social history 2) The patient's current medications and supplements 3) The patient's height, weight, and BMI have been recorded in the chart

## 2024-01-17 DIAGNOSIS — C051 Malignant neoplasm of soft palate: Secondary | ICD-10-CM | POA: Diagnosis not present

## 2024-03-18 ENCOUNTER — Encounter: Payer: Self-pay | Admitting: Internal Medicine

## 2024-03-18 ENCOUNTER — Ambulatory Visit (INDEPENDENT_AMBULATORY_CARE_PROVIDER_SITE_OTHER): Payer: Medicare Other | Admitting: Internal Medicine

## 2024-03-18 VITALS — BP 110/68 | HR 70 | Ht 61.0 in | Wt 103.0 lb

## 2024-03-18 DIAGNOSIS — E042 Nontoxic multinodular goiter: Secondary | ICD-10-CM

## 2024-03-18 DIAGNOSIS — E0590 Thyrotoxicosis, unspecified without thyrotoxic crisis or storm: Secondary | ICD-10-CM | POA: Diagnosis not present

## 2024-03-18 LAB — T4, FREE: Free T4: 1.1 ng/dL (ref 0.8–1.8)

## 2024-03-18 LAB — TSH: TSH: 1.72 m[IU]/L (ref 0.40–4.50)

## 2024-03-18 NOTE — Progress Notes (Unsigned)
 Name: [name] [name]  MRN/ DOB: [id number], 1946-06-07    Age/ Sex: 78 y.o., female     PCP: Corwin Levins, MD   Reason for Endocrinology Evaluation: Hyperthyroidism/MNG     Initial Endocrinology Clinic Visit: 04/03/2013    PATIENT IDENTIFIER: [name] is a 78 y.o., female with a past medical history of HTN, CAD, hyperthyroidism, asthma and DM. She has followed with [facility] since 04/03/2013 for consultative assistance with management of her Hyperthyroidism/MNG.   HISTORICAL SUMMARY: The patient was first diagnosed with multinodular goiter and hyperthyroid in 2011.  She was not started on methimazole until 2016.  She had declined radioactive iodine ablation in the past by her previous endocrinologist note   Of note she has had recurrent adenocarcinoma of the maxillary sinus that she was initially diagnosed with in 2016.  None of the thyroid nodules has met criteria in the past for FNA, with the last thyroid ultrasound in 2014 showed stability of the nodules   She was followed by Dr. Everardo All from 2014 until March 2023  SUBJECTIVE:    Today (03/18/2024):  [name] is here for hyperthyroidism .  She is S/P right orbitotomy with resection of intra-orbital tumor 11/2022 due to adenocarcinoma of maxillary sinus with recurrence .  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/2023, she continues to follow-up with ENT  She feels recovery is slow  Patient has been noted weight loss, despite taking boost 3x daily but has no taste for the food  Has right LE neuropathy  and back pain due to scoliosis as well as right arm pain and weakness  Denies local neck swelling Denies palpitations Continues with occasional constipation  Denies tremors  Has recently recovered  from the Flu   Methimazole 5 mg , 1 tablet Monday through Wednesday   HISTORY:  Past Medical History:  Past Medical History:  Diagnosis Date    ARTHRITIS 01/30/2008   ASTHMA 09/03/2007   Cancer (HCC) 12/12/1985   palate cancer   COLON CANCER 04/07/2008   pt states she had colon cancer 1987   COPD 09/03/2007   COPD (chronic obstructive pulmonary disease) (HCC) 04/22/2013   CORONARY ARTERY DISEASE 09/03/2007   DEGENERATIVE JOINT DISEASE 01/30/2008   Eczema    GERD 09/03/2007   GLUCOSE INTOLERANCE 09/03/2007   GOITER, MULTINODULAR 05/21/2010   History of blood transfusion    HYPERCHOLESTEROLEMIA 09/03/2007   HYPERTENSION 09/03/2007   Impaired glucose tolerance 04/10/2011   LVH (left ventricular hypertrophy)    OBESITY 09/03/2007   Osteopenia    Psoriasis    Shortness of breath dyspnea    With exertion and allergies to pollen   Past Surgical History:  Past Surgical History:  Procedure Laterality Date   ABDOMINAL HYSTERECTOMY     CARDIAC CATHETERIZATION     COLON SURGERY     EYE SURGERY Right 2023   Cancer   FLOOR OF MOUTH BIOPSY N/A 12/24/2015   Procedure: ORAL BIOPSY;  Surgeon: Serena Colonel, MD;  Location: [facility] OR;  Service: ENT;  Laterality: N/A;   HEMICOLECTOMY  12/12/1985   cancer   HERNIA REPAIR     MAXILLECTOMY Right 01/22/2016   Procedure: RIGHT SIDE PARTIAL MAXILLECTOMY;  Surgeon: Serena Colonel, MD;  Location: [facility] OR;  Service: ENT;  Laterality: Right;   NECK SURGERY  2022   Removed bone   TUBAL LIGATION     Social History:  reports that she quit smoking about  45 years ago. Her smoking use included cigarettes. She started smoking about 60 years ago. She has a 15 pack-year smoking history. She has never used smokeless tobacco. She reports that she does not drink alcohol and does not use drugs. Family History:  Family History  Problem Relation Age of Onset   Liver disease Father    Heart disease Mother    Colon cancer Paternal Aunt    Thyroid disease Sister        overactive   Cancer Sister        lung, adrenal gland   Thyroid disease Brother        overactive   Colon cancer Other        aunt   Cancer  Other        Colon Cancer-Aunt   Breast cancer Neg Hx    Esophageal cancer Neg Hx    Rectal cancer Neg Hx    Stomach cancer Neg Hx      HOME MEDICATIONS: Allergies as of 03/18/2024       Reactions   Simvastatin Other (See Comments)   Muscle spasms        Medication List        Accurate as of March 18, 2024  9:49 AM. If you have any questions, ask your nurse or doctor.          amLODipine 5 MG tablet Commonly known as: NORVASC Take 1 tablet (5 mg total) by mouth daily.   aspirin EC 81 MG tablet Take 1 tablet (81 mg total) by mouth daily.   celecoxib 200 MG capsule Commonly known as: CELEBREX Take 200 mg by mouth 2 (two) times daily.   cholecalciferol 25 MCG (1000 UNIT) tablet Commonly known as: VITAMIN D3 Take 1,000 Units by mouth daily. Twice a week   clobetasol ointment 0.05 % Commonly known as: TEMOVATE Use twice once daily to hand and feet as needed What changed:  how much to take how to take this when to take this   cyanocobalamin 1000 MCG tablet Commonly known as: VITAMIN B12 Take 1 tablet (1,000 mcg total) by mouth every other day. What changed: additional instructions   hydrochlorothiazide 25 MG tablet Commonly known as: HYDRODIURIL TAKE 1 TABLET (25 MG TOTAL) BY MOUTH DAILY.   latanoprost 0.005 % ophthalmic solution Commonly known as: XALATAN 1 drop at bedtime.   lovastatin 40 MG tablet Commonly known as: MEVACOR TAKE 2 TABLETS BY MOUTH DAILY.   methimazole 5 MG tablet Commonly known as: TAPAZOLE Take 1 tablet (5 mg total) by mouth as directed. 1 tablet Monday through Wednesday  (skip the other 4 days of the week)   metoprolol tartrate 25 MG tablet Commonly known as: LOPRESSOR TAKE 1 1/2 TABLETS TWICE A DAY   omeprazole 20 MG capsule Commonly known as: PRILOSEC TAKE 1 CAPSULE BY MOUTH EVERY DAY   ONE TOUCH ULTRA SYSTEM KIT w/Device Kit 1 kit by Does not apply route once. 250.02   OneTouch Delica Plus Lancet30G Misc USE TO CHECK  BLOOD SUGAR ONCE DAILY   OneTouch Ultra test strip Generic drug: glucose blood USE TO CHECK BLOOD SUGARS DAILY. ANNUAL APPT DUE IN JULY MUST SEE PROVIDER FOR FUTURE REFILLS   oxyCODONE 5 MG immediate release tablet Commonly known as: Oxy IR/ROXICODONE Take 5 mg by mouth every 4 (four) hours as needed.   Spikevax syringe Generic drug: COVID-19 mRNA vaccine   tiZANidine 4 MG tablet Commonly known as: ZANAFLEX TAKE 1 TABLET BY MOUTH EVERY  6 HOURS AS NEEDED FOR MUSCLE SPASMS.   traMADol 50 MG tablet Commonly known as: ULTRAM Take 1 tablet (50 mg total) by mouth every 6 (six) hours as needed. for pain          OBJECTIVE:   PHYSICAL EXAM: VS: BP 110/68 (BP Location: Left Arm, Patient Position: Sitting, Cuff Size: Normal)   Pulse 70   Ht 5\' 1"  (1.549 m)   Wt 103 lb (46.7 kg)   SpO2 96%   BMI 19.46 kg/m    EXAM: General: Pt appears well and is in NAD  Neck: General: Supple without adenopathy. Thyroid:No goiter or nodules appreciated.   Lungs: Clear with good BS bilat   Heart: Auscultation: RRR.  Extremities:  BL LE: No pretibial edema   Mental Status: Judgment, insight: Intact Orientation: Oriented to time, place, and person Mood and affect: No depression, anxiety, or agitation     DATA REVIEWED:   Latest Reference Range & Units 03/18/24 10:13  TSH 0.40 - 4.50 mIU/L 1.72  T4,Free(Direct) 0.8 - 1.8 ng/dL 1.1    Latest Reference Range & Units 06/29/22 08:41  Sodium 135 - 145 mEq/L 138  Potassium 3.5 - 5.1 mEq/L 3.8  Chloride 96 - 112 mEq/L 98  CO2 19 - 32 mEq/L 32  Glucose 70 - 99 mg/dL 80  BUN 6 - 23 mg/dL 13  Creatinine 1.61 - 0.96 mg/dL 0.45  Calcium 8.4 - 40.9 mg/dL 9.4  Alkaline Phosphatase 39 - 117 U/L 62  Albumin 3.5 - 5.2 g/dL 4.3  AST 0 - 37 U/L 20  ALT 0 - 35 U/L 14  Total Protein 6.0 - 8.3 g/dL 7.0  Bilirubin, Direct 0.0 - 0.3 mg/dL 0.2  Total Bilirubin 0.2 - 1.2 mg/dL 0.9  GFR >81.19 mL/min 73.57      ASSESSMENT / PLAN /  RECOMMENDATIONS:   Hyperthyroidism  -Patient is clinically euthyroid -TFTs normal, no change Medications   Continue methimazole 5 mg, 1 tablet Monday through Wednesday (skip Thursday, Friday, Saturday and Sundays)     2.MNG:  -No local neck symptoms -We will continue to monitor clinically -Last ultrasound in 2014 showed stability over the prior 3 years   Follow-up in 6 months  Signed electronically by: Lyndle Herrlich, MD  [facility]  [facility] [address] [place] [place], [place] [address] Phone: [phone] FAX: [phone]      CC: Corwin Levins, MD [address] [place] [address] Phone: [phone]  Fax: [phone]   Return to Endocrinology clinic as below: Future Appointments  Date Time Provider Department Center  03/18/2024 10:10 AM Lamari Youngers, Konrad Dolores, MD [facility] None  07/04/2024 10:40 AM Corwin Levins, MD [facility] None

## 2024-03-19 ENCOUNTER — Encounter: Payer: Self-pay | Admitting: Internal Medicine

## 2024-03-19 MED ORDER — METHIMAZOLE 5 MG PO TABS: 5.0000 mg | ORAL_TABLET | ORAL | 3 refills | Status: DC

## 2024-05-29 DIAGNOSIS — D234 Other benign neoplasm of skin of scalp and neck: Secondary | ICD-10-CM | POA: Diagnosis not present

## 2024-05-29 DIAGNOSIS — C051 Malignant neoplasm of soft palate: Secondary | ICD-10-CM | POA: Diagnosis not present

## 2024-05-29 DIAGNOSIS — R918 Other nonspecific abnormal finding of lung field: Secondary | ICD-10-CM | POA: Diagnosis not present

## 2024-06-05 DIAGNOSIS — K117 Disturbances of salivary secretion: Secondary | ICD-10-CM | POA: Diagnosis not present

## 2024-06-05 DIAGNOSIS — C059 Malignant neoplasm of palate, unspecified: Secondary | ICD-10-CM | POA: Diagnosis not present

## 2024-06-05 DIAGNOSIS — Z923 Personal history of irradiation: Secondary | ICD-10-CM | POA: Diagnosis not present

## 2024-06-05 DIAGNOSIS — C7949 Secondary malignant neoplasm of other parts of nervous system: Secondary | ICD-10-CM | POA: Diagnosis not present

## 2024-06-05 DIAGNOSIS — Z8522 Personal history of malignant neoplasm of nasal cavities, middle ear, and accessory sinuses: Secondary | ICD-10-CM | POA: Diagnosis not present

## 2024-06-05 DIAGNOSIS — Z9889 Other specified postprocedural states: Secondary | ICD-10-CM | POA: Diagnosis not present

## 2024-06-05 DIAGNOSIS — R432 Parageusia: Secondary | ICD-10-CM | POA: Diagnosis not present

## 2024-06-05 DIAGNOSIS — R5383 Other fatigue: Secondary | ICD-10-CM | POA: Diagnosis not present

## 2024-06-05 DIAGNOSIS — Z87891 Personal history of nicotine dependence: Secondary | ICD-10-CM | POA: Diagnosis not present

## 2024-06-05 DIAGNOSIS — R519 Headache, unspecified: Secondary | ICD-10-CM | POA: Diagnosis not present

## 2024-06-05 DIAGNOSIS — R252 Cramp and spasm: Secondary | ICD-10-CM | POA: Diagnosis not present

## 2024-06-05 DIAGNOSIS — C310 Malignant neoplasm of maxillary sinus: Secondary | ICD-10-CM | POA: Diagnosis not present

## 2024-06-12 DIAGNOSIS — D320 Benign neoplasm of cerebral meninges: Secondary | ICD-10-CM | POA: Diagnosis not present

## 2024-06-12 DIAGNOSIS — C310 Malignant neoplasm of maxillary sinus: Secondary | ICD-10-CM | POA: Diagnosis not present

## 2024-06-12 DIAGNOSIS — Z510 Encounter for antineoplastic radiation therapy: Secondary | ICD-10-CM | POA: Diagnosis not present

## 2024-06-12 DIAGNOSIS — C6961 Malignant neoplasm of right orbit: Secondary | ICD-10-CM | POA: Diagnosis not present

## 2024-06-18 DIAGNOSIS — C059 Malignant neoplasm of palate, unspecified: Secondary | ICD-10-CM | POA: Diagnosis not present

## 2024-06-18 DIAGNOSIS — C310 Malignant neoplasm of maxillary sinus: Secondary | ICD-10-CM | POA: Diagnosis not present

## 2024-06-19 DIAGNOSIS — C059 Malignant neoplasm of palate, unspecified: Secondary | ICD-10-CM | POA: Diagnosis not present

## 2024-06-19 DIAGNOSIS — C051 Malignant neoplasm of soft palate: Secondary | ICD-10-CM | POA: Diagnosis not present

## 2024-06-21 DIAGNOSIS — C6961 Malignant neoplasm of right orbit: Secondary | ICD-10-CM | POA: Diagnosis not present

## 2024-06-21 DIAGNOSIS — C310 Malignant neoplasm of maxillary sinus: Secondary | ICD-10-CM | POA: Diagnosis not present

## 2024-06-21 DIAGNOSIS — Z510 Encounter for antineoplastic radiation therapy: Secondary | ICD-10-CM | POA: Diagnosis not present

## 2024-06-24 DIAGNOSIS — C310 Malignant neoplasm of maxillary sinus: Secondary | ICD-10-CM | POA: Diagnosis not present

## 2024-06-24 DIAGNOSIS — Z79899 Other long term (current) drug therapy: Secondary | ICD-10-CM | POA: Diagnosis not present

## 2024-06-24 DIAGNOSIS — C7989 Secondary malignant neoplasm of other specified sites: Secondary | ICD-10-CM | POA: Diagnosis not present

## 2024-06-24 DIAGNOSIS — C760 Malignant neoplasm of head, face and neck: Secondary | ICD-10-CM | POA: Diagnosis not present

## 2024-06-24 DIAGNOSIS — C051 Malignant neoplasm of soft palate: Secondary | ICD-10-CM | POA: Diagnosis not present

## 2024-06-24 DIAGNOSIS — R918 Other nonspecific abnormal finding of lung field: Secondary | ICD-10-CM | POA: Diagnosis not present

## 2024-06-24 DIAGNOSIS — K5730 Diverticulosis of large intestine without perforation or abscess without bleeding: Secondary | ICD-10-CM | POA: Diagnosis not present

## 2024-06-25 DIAGNOSIS — C6961 Malignant neoplasm of right orbit: Secondary | ICD-10-CM | POA: Diagnosis not present

## 2024-06-25 DIAGNOSIS — Z510 Encounter for antineoplastic radiation therapy: Secondary | ICD-10-CM | POA: Diagnosis not present

## 2024-06-25 DIAGNOSIS — C310 Malignant neoplasm of maxillary sinus: Secondary | ICD-10-CM | POA: Diagnosis not present

## 2024-06-25 DIAGNOSIS — C7931 Secondary malignant neoplasm of brain: Secondary | ICD-10-CM | POA: Diagnosis not present

## 2024-07-02 DIAGNOSIS — C310 Malignant neoplasm of maxillary sinus: Secondary | ICD-10-CM | POA: Diagnosis not present

## 2024-07-02 DIAGNOSIS — C7989 Secondary malignant neoplasm of other specified sites: Secondary | ICD-10-CM | POA: Diagnosis not present

## 2024-07-04 ENCOUNTER — Ambulatory Visit: Payer: Medicare Other | Admitting: Internal Medicine

## 2024-07-09 DIAGNOSIS — C310 Malignant neoplasm of maxillary sinus: Secondary | ICD-10-CM | POA: Diagnosis not present

## 2024-07-25 ENCOUNTER — Other Ambulatory Visit: Payer: Self-pay | Admitting: Internal Medicine

## 2024-07-25 DIAGNOSIS — C310 Malignant neoplasm of maxillary sinus: Secondary | ICD-10-CM

## 2024-07-31 NOTE — Telephone Encounter (Signed)
 Pt called in to see why her medication was denied by insurance stating that insurance wants to know why she still needs it. traMADol  (ULTRAM ) 50 MG tablet . Has 7 pills left and only takes one a day

## 2024-08-14 DIAGNOSIS — C051 Malignant neoplasm of soft palate: Secondary | ICD-10-CM | POA: Diagnosis not present

## 2024-09-16 ENCOUNTER — Ambulatory Visit: Admitting: Internal Medicine

## 2024-09-16 ENCOUNTER — Other Ambulatory Visit

## 2024-09-16 ENCOUNTER — Encounter: Payer: Self-pay | Admitting: Internal Medicine

## 2024-09-16 VITALS — BP 122/80 | HR 55 | Ht 61.0 in | Wt 101.4 lb

## 2024-09-16 DIAGNOSIS — E042 Nontoxic multinodular goiter: Secondary | ICD-10-CM | POA: Diagnosis not present

## 2024-09-16 DIAGNOSIS — E0590 Thyrotoxicosis, unspecified without thyrotoxic crisis or storm: Secondary | ICD-10-CM | POA: Diagnosis not present

## 2024-09-16 LAB — TSH: TSH: 1.6 m[IU]/L (ref 0.40–4.50)

## 2024-09-16 LAB — T4, FREE: Free T4: 1.3 ng/dL (ref 0.8–1.8)

## 2024-09-16 NOTE — Progress Notes (Unsigned)
 Name: MARGY SUMLER  MRN/ DOB: [id number], Nov 22, 1946    Age/ Sex: 78 y.o., female     PCP: Norleen Lynwood ORN, MD   Reason for Endocrinology Evaluation: Hyperthyroidism/MNG     Initial Endocrinology Clinic Visit: 04/03/2013    PATIENT IDENTIFIER: [name] [name] is a 78 y.o., female with a past medical history of HTN, CAD, hyperthyroidism, asthma and DM. She has followed with [facility] since 04/03/2013 for consultative assistance with management of her Hyperthyroidism/MNG.   HISTORICAL SUMMARY: The patient was first diagnosed with multinodular goiter and hyperthyroid in 2011.  She was not started on methimazole  until 2016.  She had declined radioactive iodine ablation in the past by her previous endocrinologist note   Of note she has had recurrent adenocarcinoma of the maxillary sinus that she was initially diagnosed with in 2016.  None of the thyroid  nodules has met criteria in the past for FNA, with the last thyroid  ultrasound in 2014 showed stability of the nodules   She was followed by Dr. Kassie from 2014 until March 2023  SUBJECTIVE:    Today (09/16/2024):  Ms. Kipp is here for hyperthyroidism .  She is S/P right orbitotomy with resection of intra-orbital tumor 11/2022 due to adenocarcinoma of maxillary sinus with recurrence .  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  This is scheduled for 10/01/2024 The patient did receive radiation to the brain in July, 2025 due to brain mets  Weight has been overall stable She continues with right eye pain  She is having upcoming dental procedures  NO local neck swelling , continues with speech therapy but cancelled appoint in July Has noted worsening Dysphagia , mostly solids  Denies palpitations She did have  constipation, but this resolved with Dulcolax  Methimazole  5 mg , 1 tablet  1 tablet Monday through Wednesday (skip Thursday, Friday, Saturday and Sundays)  HISTORY:  Past Medical History:  Past Medical History:  Diagnosis Date   ARTHRITIS 01/30/2008   ASTHMA 09/03/2007   Cancer (HCC) 12/12/1985   palate cancer   COLON CANCER 04/07/2008   pt states she had colon cancer 1987   COPD 09/03/2007   COPD (chronic obstructive pulmonary disease) (HCC) 04/22/2013   CORONARY ARTERY DISEASE 09/03/2007   DEGENERATIVE JOINT DISEASE 01/30/2008   Eczema    GERD 09/03/2007   GLUCOSE INTOLERANCE 09/03/2007   GOITER, MULTINODULAR 05/21/2010   History of blood transfusion    HYPERCHOLESTEROLEMIA 09/03/2007   HYPERTENSION 09/03/2007   Impaired glucose tolerance 04/10/2011   LVH (left ventricular hypertrophy)    OBESITY 09/03/2007   Osteopenia    Psoriasis    Shortness of breath dyspnea    With exertion and allergies to pollen   Past Surgical History:  Past Surgical History:  Procedure Laterality Date   ABDOMINAL HYSTERECTOMY     CARDIAC CATHETERIZATION     COLON SURGERY     EYE SURGERY Right 2023   Cancer   FLOOR OF MOUTH BIOPSY N/A 12/24/2015   Procedure: ORAL BIOPSY;  Surgeon: Ida Loader, MD;  [facility];  Service: ENT;  Laterality: N/A;   HEMICOLECTOMY  12/12/1985   cancer   HERNIA REPAIR     MAXILLECTOMY Right 01/22/2016   Procedure: RIGHT SIDE PARTIAL MAXILLECTOMY;  Surgeon: Ida Loader, MD;  Location: [facility] OR;  Service:  ENT;  Laterality: Right;   NECK SURGERY  2022   Removed bone   TUBAL LIGATION     Social History:  reports that she quit smoking about 45 years ago. Her smoking use included cigarettes. She started smoking about 60 years ago. She has a 15 pack-year smoking history. She has never used smokeless tobacco. She reports that she does not drink alcohol and does not use drugs. Family History:  Family History  Problem Relation Age of Onset   Liver disease Father     Heart disease Mother    Colon cancer Paternal Aunt    Thyroid  disease Sister        overactive   Cancer Sister        lung, adrenal gland   Thyroid  disease Brother        overactive   Colon cancer Other        aunt   Cancer Other        Colon Cancer-Aunt   Breast cancer Neg Hx    Esophageal cancer Neg Hx    Rectal cancer Neg Hx    Stomach cancer Neg Hx      HOME MEDICATIONS: Allergies as of 09/16/2024       Reactions   Simvastatin Other (See Comments)   Muscle spasms        Medication List        Accurate as of September 16, 2024 10:06 AM. If you have any questions, ask your nurse or doctor.          amLODipine  5 MG tablet Commonly known as: NORVASC  Take 1 tablet (5 mg total) by mouth daily.   aspirin  EC 81 MG tablet Take 1 tablet (81 mg total) by mouth daily.   celecoxib 200 MG capsule Commonly known as: CELEBREX Take 200 mg by mouth 2 (two) times daily.   cholecalciferol 25 MCG (1000 UNIT) tablet Commonly known as: VITAMIN D3 Take 1,000 Units by mouth daily. Twice a week   clobetasol  ointment 0.05 % Commonly known as: TEMOVATE  Use twice once daily to hand and feet as needed What changed:  how much to take how to take this when to take this   cyanocobalamin  1000 MCG tablet Commonly known as: VITAMIN B12 Take 1 tablet (1,000 mcg total) by mouth every other day. What changed: additional instructions   hydrochlorothiazide  25 MG tablet Commonly known as: HYDRODIURIL  TAKE 1 TABLET (25 MG TOTAL) BY MOUTH DAILY.   latanoprost 0.005 % ophthalmic solution Commonly known as: XALATAN 1 drop at bedtime.   lovastatin  40 MG tablet Commonly known as: MEVACOR  TAKE 2 TABLETS BY MOUTH DAILY.   methimazole  5 MG tablet Commonly known as: TAPAZOLE  Take 1 tablet (5 mg total) by mouth as directed. 1 tablet Monday through Wednesday  (skip the other 4 days of the week)   metoprolol  tartrate 25 MG tablet Commonly known as: LOPRESSOR  TAKE 1 1/2 TABLETS TWICE  A DAY   omeprazole  20 MG capsule Commonly known as: PRILOSEC TAKE 1 CAPSULE BY MOUTH EVERY DAY   ONE TOUCH ULTRA SYSTEM KIT w/Device Kit 1 kit by Does not apply route once. 250.02   OneTouch Delica Plus Lancet30G Misc USE TO CHECK BLOOD SUGAR ONCE DAILY   OneTouch Ultra test strip Generic drug: glucose blood USE TO CHECK BLOOD SUGARS DAILY. ANNUAL APPT DUE IN JULY MUST SEE PROVIDER FOR FUTURE REFILLS   oxyCODONE  5 MG immediate release tablet Commonly known as: Oxy IR/ROXICODONE  Take 5 mg by mouth every  4 (four) hours as needed.   Spikevax syringe Generic drug: COVID-19 mRNA vaccine   tiZANidine  4 MG tablet Commonly known as: ZANAFLEX  TAKE 1 TABLET BY MOUTH EVERY 6 HOURS AS NEEDED FOR MUSCLE SPASMS.   traMADol  50 MG tablet Commonly known as: ULTRAM  TAKE 1 TABLET BY MOUTH EVERY 6 HOURS AS NEEDED. FOR PAIN          OBJECTIVE:   PHYSICAL EXAM: VS: BP 122/80 (BP Location: Left Arm, Patient Position: Sitting, Cuff Size: Large)   Pulse (!) 55   Ht 5' 1 (1.549 m)   Wt 101 lb 6.4 oz (46 kg)   SpO2 98%   BMI 19.16 kg/m    EXAM: General: Pt appears well and is in NAD  Neck: General: Supple without adenopathy. Thyroid :No goiter or nodules appreciated.   Lungs: Clear with good BS bilat   Heart: Auscultation: RRR.  Extremities:  BL LE: No pretibial edema   Mental Status: Judgment, insight: Intact Orientation: Oriented to time, place, and person Mood and affect: No depression, anxiety, or agitation     DATA REVIEWED:   Latest Reference Range & Units 09/16/24 10:25  TSH 0.40 - 4.50 mIU/L 1.60  T4,Free(Direct) 0.8 - 1.8 ng/dL 1.3       Latest Reference Range & Units 03/18/24 10:13  TSH 0.40 - 4.50 mIU/L 1.72  T4,Free(Direct) 0.8 - 1.8 ng/dL 1.1    Latest Reference Range & Units 06/29/22 08:41  Sodium 135 - 145 mEq/L 138  Potassium 3.5 - 5.1 mEq/L 3.8  Chloride 96 - 112 mEq/L 98  CO2 19 - 32 mEq/L 32  Glucose 70 - 99 mg/dL 80  BUN 6 - 23 mg/dL 13   Creatinine 9.59 - 1.20 mg/dL 9.21  Calcium 8.4 - 89.4 mg/dL 9.4  Alkaline Phosphatase 39 - 117 U/L 62  Albumin 3.5 - 5.2 g/dL 4.3  AST 0 - 37 U/L 20  ALT 0 - 35 U/L 14  Total Protein 6.0 - 8.3 g/dL 7.0  Bilirubin, Direct 0.0 - 0.3 mg/dL 0.2  Total Bilirubin 0.2 - 1.2 mg/dL 0.9  GFR >39.99 mL/min 73.57      ASSESSMENT / PLAN / RECOMMENDATIONS:   Hyperthyroidism  -Patient is clinically euthyroid - No local neck symptoms, but there was a discussion through her dentist regarding airway, I did remind the patient that she does have history of multinodular goiter, patient opted to hold off on thyroid  ultrasound and will follow-up with dentist at this time - TFTs are normal, no change    Medications   Continue methimazole  5 mg, 1 tablet Monday through Wednesday (skip Thursday, Friday, Saturday and Sundays)     2.MNG:  -No local neck symptoms -We will continue to monitor clinically -Last ultrasound in 2014 showed stability over the prior 3 years   Follow-up in 6 months  Signed electronically by: Stefano Redgie Butts, MD  [facility]  [facility] [address] [place] [place], [place] [address] Phone: [phone] FAX: [phone]      CC: Norleen Lynwood ORN, MD [address] [place] [address] Phone: [phone]  Fax: [phone]   Return to Endocrinology clinic as below: Future Appointments  Date Time Provider Department Center  09/16/2024 10:10 AM Malayna Noori, Donell Redgie, MD [facility] None

## 2024-09-17 ENCOUNTER — Ambulatory Visit: Payer: Self-pay | Admitting: Internal Medicine

## 2024-09-17 MED ORDER — METHIMAZOLE 5 MG PO TABS: 5.0000 mg | ORAL_TABLET | ORAL | 3 refills | Status: AC

## 2024-10-01 DIAGNOSIS — E119 Type 2 diabetes mellitus without complications: Secondary | ICD-10-CM | POA: Diagnosis not present

## 2024-10-01 DIAGNOSIS — C051 Malignant neoplasm of soft palate: Secondary | ICD-10-CM | POA: Diagnosis not present

## 2024-10-01 DIAGNOSIS — C4449 Other specified malignant neoplasm of skin of scalp and neck: Secondary | ICD-10-CM | POA: Diagnosis not present

## 2024-10-01 DIAGNOSIS — Z79899 Other long term (current) drug therapy: Secondary | ICD-10-CM | POA: Diagnosis not present

## 2024-10-01 DIAGNOSIS — L988 Other specified disorders of the skin and subcutaneous tissue: Secondary | ICD-10-CM | POA: Diagnosis not present

## 2024-11-26 ENCOUNTER — Encounter: Payer: Self-pay | Admitting: *Deleted

## 2024-11-26 NOTE — Progress Notes (Signed)
 [name] [name]                                          MRN: [id number]   11/26/2024   The [facility] Quality Team Specialist reviewed this patient medical record for the purposes of chart review for care gap closure. The following were reviewed: chart review for care gap closure-kidney health evaluation for diabetes:eGFR  and uACR.    [facility]BCI Quality Team

## 2024-12-10 NOTE — Progress Notes (Signed)
 [name] [name]                                          MRN: [id number]   12/10/2024   The [facility] Quality Team Specialist reviewed this patient medical record for the purposes of chart review for care gap closure. The following were reviewed: chart review for care gap closure-kidney health evaluation for diabetes:eGFR  and uACR.    [facility]BCI Quality Team

## 2024-12-11 NOTE — Progress Notes (Signed)
 [name] [name]                                          MRN: [id number]   12/11/2024   The [facility] Quality Team Specialist reviewed this patient medical record for the purposes of chart review for care gap closure. The following were reviewed: chart review for care gap closure-kidney health evaluation for diabetes:eGFR  and uACR.    [facility]BCI Quality Team

## 2024-12-31 ENCOUNTER — Other Ambulatory Visit: Payer: Self-pay | Admitting: Internal Medicine

## 2025-01-01 ENCOUNTER — Other Ambulatory Visit (INDEPENDENT_AMBULATORY_CARE_PROVIDER_SITE_OTHER)

## 2025-01-01 ENCOUNTER — Ambulatory Visit: Payer: Self-pay | Admitting: Internal Medicine

## 2025-01-01 ENCOUNTER — Other Ambulatory Visit: Payer: Self-pay

## 2025-01-01 DIAGNOSIS — E559 Vitamin D deficiency, unspecified: Secondary | ICD-10-CM

## 2025-01-01 DIAGNOSIS — E1140 Type 2 diabetes mellitus with diabetic neuropathy, unspecified: Secondary | ICD-10-CM

## 2025-01-01 LAB — BASIC METABOLIC PANEL WITH GFR
BUN: 17 mg/dL (ref 6–23)
CO2: 33 meq/L — ABNORMAL HIGH (ref 19–32)
Calcium: 9.4 mg/dL (ref 8.4–10.5)
Chloride: 98 meq/L (ref 96–112)
Creatinine, Ser: 0.73 mg/dL (ref 0.40–1.20)
GFR: 78.27 mL/min
Glucose, Bld: 86 mg/dL (ref 70–99)
Potassium: 3.6 meq/L (ref 3.5–5.1)
Sodium: 136 meq/L (ref 135–145)

## 2025-01-01 LAB — LIPID PANEL
Cholesterol: 154 mg/dL (ref 28–200)
HDL: 72 mg/dL
LDL Cholesterol: 67 mg/dL (ref 10–99)
NonHDL: 81.96
Total CHOL/HDL Ratio: 2
Triglycerides: 74 mg/dL (ref 10.0–149.0)
VLDL: 14.8 mg/dL (ref 0.0–40.0)

## 2025-01-01 LAB — HEPATIC FUNCTION PANEL
ALT: 14 U/L (ref 3–35)
AST: 22 U/L (ref 5–37)
Albumin: 4 g/dL (ref 3.5–5.2)
Alkaline Phosphatase: 58 U/L (ref 39–117)
Bilirubin, Direct: 0.2 mg/dL (ref 0.1–0.3)
Total Bilirubin: 0.9 mg/dL (ref 0.2–1.2)
Total Protein: 7.3 g/dL (ref 6.0–8.3)

## 2025-01-01 LAB — VITAMIN D 25 HYDROXY (VIT D DEFICIENCY, FRACTURES): VITD: 78.29 ng/mL (ref 30.00–100.00)

## 2025-01-02 LAB — HEMOGLOBIN A1C
Est. average glucose Bld gHb Est-mCnc: 85 mg/dL
Hgb A1c MFr Bld: 4.6 % — ABNORMAL LOW (ref 4.8–5.6)

## 2025-01-08 ENCOUNTER — Ambulatory Visit: Admitting: Internal Medicine

## 2025-01-09 ENCOUNTER — Other Ambulatory Visit: Payer: Self-pay

## 2025-01-09 ENCOUNTER — Telehealth: Payer: Self-pay

## 2025-01-09 DIAGNOSIS — I10 Essential (primary) hypertension: Secondary | ICD-10-CM

## 2025-01-09 MED ORDER — METOPROLOL TARTRATE 25 MG PO TABS: ORAL_TABLET | ORAL | 3 refills | Status: AC

## 2025-01-09 MED ORDER — AMLODIPINE BESYLATE 5 MG PO TABS: 5.0000 mg | ORAL_TABLET | Freq: Every day | ORAL | 3 refills | Status: AC

## 2025-01-09 NOTE — Telephone Encounter (Signed)
 Copied from CRM [id number]. Topic: Clinical - Medication Refill >> Jan 09, 2025  2:52 PM Viola F wrote: Medication: amLODipine  (NORVASC ) 5 MG tablet [[id number]] metoprolol  tartrate (LOPRESSOR ) 25 MG tablet [[id number]]   Has the patient contacted their pharmacy? Yes (Agent: If no, request that the patient contact the pharmacy for the refill. If patient does not wish to contact the pharmacy document the reason why and proceed with request.) (Agent: If yes, when and what did the pharmacy advise?)  This is the patient's preferred pharmacy:   [id number] - [place], [place] - [address] [place] [address] Phone: [phone] Fax: [phone]  Is this the correct pharmacy for this prescription? Yes If no, delete pharmacy and type the correct one.   Has the prescription been filled recently? Yes  Is the patient out of the medication? No  Has the patient been seen for an appointment in the last year OR does the patient have an upcoming appointment? Yes  Can we respond through MyChart? No  Agent: Please be advised that Rx refills may take up to 3 business days. We ask that you follow-up with your pharmacy.

## 2025-01-09 NOTE — Telephone Encounter (Signed)
 Refills have been sent.

## 2025-03-18 ENCOUNTER — Ambulatory Visit: Admitting: Internal Medicine

## 2025-07-03 ENCOUNTER — Encounter: Admitting: Family Medicine
